# Patient Record
Sex: Male | Born: 1940 | Race: Black or African American | Hispanic: No | Smoking: Never smoker
Health system: Southern US, Community
[De-identification: ages and names within clinical notes are randomized; demographics above are authoritative.]

## PROBLEM LIST (undated history)

## (undated) DIAGNOSIS — I480 Paroxysmal atrial fibrillation: Secondary | ICD-10-CM

## (undated) DIAGNOSIS — E119 Type 2 diabetes mellitus without complications: Secondary | ICD-10-CM

## (undated) DIAGNOSIS — K922 Gastrointestinal hemorrhage, unspecified: Secondary | ICD-10-CM

## (undated) DIAGNOSIS — N183 Chronic kidney disease, stage 3 unspecified: Secondary | ICD-10-CM

## (undated) DIAGNOSIS — I442 Atrioventricular block, complete: Secondary | ICD-10-CM

## (undated) DIAGNOSIS — I119 Hypertensive heart disease without heart failure: Secondary | ICD-10-CM

## (undated) DIAGNOSIS — C189 Malignant neoplasm of colon, unspecified: Secondary | ICD-10-CM

## (undated) DIAGNOSIS — Z8679 Personal history of other diseases of the circulatory system: Secondary | ICD-10-CM

## (undated) DIAGNOSIS — I10 Essential (primary) hypertension: Secondary | ICD-10-CM

## (undated) HISTORY — DX: Hypertensive heart disease without heart failure: I11.9

## (undated) HISTORY — DX: Malignant neoplasm of colon, unspecified: C18.9

## (undated) HISTORY — DX: Gastrointestinal hemorrhage, unspecified: K92.2

## (undated) HISTORY — PX: CARDIAC CATHETERIZATION: SHX172

---

## 2005-03-03 ENCOUNTER — Ambulatory Visit (HOSPITAL_COMMUNITY): Payer: Self-pay | Admitting: General Surgery

## 2011-10-08 ENCOUNTER — Emergency Department (HOSPITAL_COMMUNITY): Payer: Medicare Other

## 2011-10-08 ENCOUNTER — Encounter (HOSPITAL_COMMUNITY): Payer: Self-pay | Admitting: Emergency Medicine

## 2011-10-08 ENCOUNTER — Inpatient Hospital Stay (HOSPITAL_COMMUNITY)
Admission: EM | Admit: 2011-10-08 | Discharge: 2011-10-11 | DRG: 308 | Disposition: A | Discharge status: Home / Self Care | Payer: Medicare Other | Attending: Internal Medicine | Admitting: Internal Medicine

## 2011-10-08 DIAGNOSIS — I509 Heart failure, unspecified: Secondary | ICD-10-CM

## 2011-10-08 DIAGNOSIS — I4891 Unspecified atrial fibrillation: Principal | ICD-10-CM | POA: Diagnosis present

## 2011-10-08 DIAGNOSIS — I5021 Acute systolic (congestive) heart failure: Secondary | ICD-10-CM | POA: Diagnosis present

## 2011-10-08 DIAGNOSIS — I428 Other cardiomyopathies: Secondary | ICD-10-CM | POA: Diagnosis present

## 2011-10-08 DIAGNOSIS — R0602 Shortness of breath: Secondary | ICD-10-CM | POA: Diagnosis present

## 2011-10-08 DIAGNOSIS — I5023 Acute on chronic systolic (congestive) heart failure: Secondary | ICD-10-CM | POA: Diagnosis present

## 2011-10-08 DIAGNOSIS — I429 Cardiomyopathy, unspecified: Secondary | ICD-10-CM | POA: Diagnosis present

## 2011-10-08 DIAGNOSIS — G4733 Obstructive sleep apnea (adult) (pediatric): Secondary | ICD-10-CM | POA: Diagnosis present

## 2011-10-08 DIAGNOSIS — E119 Type 2 diabetes mellitus without complications: Secondary | ICD-10-CM | POA: Diagnosis present

## 2011-10-08 DIAGNOSIS — I10 Essential (primary) hypertension: Secondary | ICD-10-CM | POA: Diagnosis present

## 2011-10-08 HISTORY — DX: Essential (primary) hypertension: I10

## 2011-10-08 LAB — MAGNESIUM: Magnesium: 1.7 mg/dL (ref 1.5–2.5)

## 2011-10-08 LAB — COMPREHENSIVE METABOLIC PANEL
AST: 20 U/L (ref 0–37)
Alkaline Phosphatase: 69 U/L (ref 39–117)
BUN: 13 mg/dL (ref 6–23)
CO2: 20 mEq/L (ref 19–32)
Chloride: 106 mEq/L (ref 96–112)
Creatinine, Ser: 1.08 mg/dL (ref 0.50–1.35)
GFR calc non Af Amer: 68 mL/min — ABNORMAL LOW (ref >90)
Potassium: 3.7 mEq/L (ref 3.5–5.1)
Total Bilirubin: 0.8 mg/dL (ref 0.3–1.2)

## 2011-10-08 LAB — CARDIAC PANEL(CRET KIN+CKTOT+MB+TROPI)
CK, MB: 7.8 ng/mL (ref 0.3–4.0)
Relative Index: 2.9 — ABNORMAL HIGH (ref 0.0–2.5)
Total CK: 296 U/L — ABNORMAL HIGH (ref 7–232)
Total CK: 269 U/L — ABNORMAL HIGH (ref 7–232)
Troponin I: <0.3 ng/mL

## 2011-10-08 LAB — DIFFERENTIAL
Basophils Absolute: 0 10*3/uL (ref 0.0–0.1)
Eosinophils Absolute: 0.1 10*3/uL (ref 0.0–0.7)
Eosinophils Relative: 2 % (ref 0–5)

## 2011-10-08 LAB — CBC
MCH: 27.1 pg (ref 26.0–34.0)
MCV: 79 fL (ref 78.0–100.0)
Platelets: 214 10*3/uL (ref 150–400)
RDW: 15.3 % (ref 11.5–15.5)

## 2011-10-08 LAB — HEPARIN LEVEL (UNFRACTIONATED): Heparin Unfractionated: 0.36 IU/mL (ref 0.30–0.70)

## 2011-10-08 LAB — POCT I-STAT, CHEM 8
BUN: 15 mg/dL (ref 6–23)
Creatinine, Ser: 1.1 mg/dL (ref 0.50–1.35)
Glucose, Bld: 212 mg/dL — ABNORMAL HIGH (ref 70–99)
Hemoglobin: 12.9 g/dL — ABNORMAL LOW (ref 13.0–17.0)
Potassium: 4 mEq/L (ref 3.5–5.1)

## 2011-10-08 LAB — POCT I-STAT TROPONIN I

## 2011-10-08 LAB — GLUCOSE, CAPILLARY: Glucose-Capillary: 162 mg/dL — ABNORMAL HIGH (ref 70–99)

## 2011-10-08 LAB — APTT: aPTT: 31 seconds (ref 24–37)

## 2011-10-08 LAB — TSH: TSH: 2.082 u[IU]/mL (ref 0.350–4.500)

## 2011-10-08 MED ORDER — INSULIN ASPART 100 UNIT/ML ~~LOC~~ SOLN
0.0000 [IU] | Freq: Every day | SUBCUTANEOUS | Status: DC
Start: 2011-10-08 — End: 2011-10-11

## 2011-10-08 MED ORDER — ASPIRIN EC 81 MG PO TBEC
81.0000 mg | DELAYED_RELEASE_TABLET | Freq: Every day | ORAL | Status: DC
  Administered 2011-10-09 – 2011-10-11 (×3): 81 mg via ORAL
  Filled 2011-10-08 (×3): qty 1

## 2011-10-08 MED ORDER — ADENOSINE 6 MG/2ML IV SOLN
INTRAVENOUS | Status: AC
  Filled 2011-10-08: qty 6

## 2011-10-08 MED ORDER — DILTIAZEM HCL 50 MG/10ML IV SOLN
10.0000 mg | Freq: Once | INTRAVENOUS | Status: AC
  Administered 2011-10-08: 10 mg via INTRAVENOUS
  Filled 2011-10-08: qty 2

## 2011-10-08 MED ORDER — LINAGLIPTIN 5 MG PO TABS
5.0000 mg | ORAL_TABLET | Freq: Every day | ORAL | Status: DC
  Administered 2011-10-08 – 2011-10-11 (×4): 5 mg via ORAL
  Filled 2011-10-08 (×4): qty 1

## 2011-10-08 MED ORDER — METOPROLOL TARTRATE 12.5 MG HALF TABLET: 12.5000 mg | ORAL_TABLET | Freq: Two times a day (BID) | ORAL | Status: DC

## 2011-10-08 MED ORDER — BENAZEPRIL HCL 40 MG PO TABS
40.0000 mg | ORAL_TABLET | Freq: Every day | ORAL | Status: DC
  Administered 2011-10-08 – 2011-10-11 (×4): 40 mg via ORAL
  Filled 2011-10-08 (×4): qty 1

## 2011-10-08 MED ORDER — SODIUM CHLORIDE 0.9 % IV SOLN
INTRAVENOUS | Status: DC
  Administered 2011-10-08: 10 mL/h via INTRAVENOUS

## 2011-10-08 MED ORDER — FUROSEMIDE 10 MG/ML IJ SOLN
40.0000 mg | Freq: Once | INTRAMUSCULAR | Status: AC
  Administered 2011-10-08: 40 mg via INTRAVENOUS
  Filled 2011-10-08: qty 4

## 2011-10-08 MED ORDER — ASPIRIN 81 MG PO CHEW
324.0000 mg | CHEWABLE_TABLET | ORAL | Status: AC
  Administered 2011-10-08: 324 mg via ORAL
  Filled 2011-10-08: qty 4

## 2011-10-08 MED ORDER — INSULIN ASPART 100 UNIT/ML ~~LOC~~ SOLN
0.0000 [IU] | Freq: Three times a day (TID) | SUBCUTANEOUS | Status: DC
  Administered 2011-10-08: 18:00:00 via SUBCUTANEOUS
  Administered 2011-10-09: 1 [IU] via SUBCUTANEOUS
  Administered 2011-10-09: 3 [IU] via SUBCUTANEOUS
  Administered 2011-10-09: 2 [IU] via SUBCUTANEOUS
  Administered 2011-10-10: 3 [IU] via SUBCUTANEOUS
  Administered 2011-10-10 – 2011-10-11 (×2): 2 [IU] via SUBCUTANEOUS

## 2011-10-08 MED ORDER — ZOLPIDEM TARTRATE 5 MG PO TABS: 5.0000 mg | ORAL_TABLET | Freq: Every evening | ORAL | Status: DC | PRN

## 2011-10-08 MED ORDER — HEPARIN (PORCINE) IN NACL 100-0.45 UNIT/ML-% IJ SOLN
1650.0000 [IU]/h | INTRAMUSCULAR | Status: AC
  Administered 2011-10-08: 1650 [IU]/h via INTRAVENOUS
  Administered 2011-10-08: 1550 [IU]/h via INTRAVENOUS
  Administered 2011-10-08 – 2011-10-11 (×4): 1650 [IU]/h via INTRAVENOUS
  Filled 2011-10-08 (×8): qty 250

## 2011-10-08 MED ORDER — DILTIAZEM HCL 25 MG/5ML IV SOLN
INTRAVENOUS | Status: AC
  Administered 2011-10-08: 10 mg
  Filled 2011-10-08: qty 5

## 2011-10-08 MED ORDER — ATORVASTATIN CALCIUM 40 MG PO TABS
40.0000 mg | ORAL_TABLET | Freq: Every day | ORAL | Status: DC
  Administered 2011-10-08 – 2011-10-10 (×3): 40 mg via ORAL
  Filled 2011-10-08 (×4): qty 1

## 2011-10-08 MED ORDER — ACETAMINOPHEN 325 MG PO TABS: 650.0000 mg | ORAL_TABLET | ORAL | Status: DC | PRN

## 2011-10-08 MED ORDER — FUROSEMIDE 40 MG PO TABS: 40.0000 mg | ORAL_TABLET | Freq: Every day | ORAL | Status: DC

## 2011-10-08 MED ORDER — ALPRAZOLAM 0.25 MG PO TABS: 0.2500 mg | ORAL_TABLET | Freq: Two times a day (BID) | ORAL | Status: DC | PRN

## 2011-10-08 MED ORDER — DILTIAZEM LOAD VIA INFUSION
10.0000 mg | Freq: Once | INTRAVENOUS | Status: AC
  Administered 2011-10-08: 10 mg via INTRAVENOUS

## 2011-10-08 MED ORDER — DILTIAZEM HCL 100 MG IV SOLR: 5.0000 mg/h | INTRAVENOUS | Status: DC

## 2011-10-08 MED ORDER — DILTIAZEM HCL 100 MG IV SOLR
5.0000 mg/h | INTRAVENOUS | Status: DC
  Administered 2011-10-08: 5 mg/h via INTRAVENOUS
  Administered 2011-10-08 – 2011-10-10 (×6): 10 mg/h via INTRAVENOUS
  Filled 2011-10-08 (×6): qty 100

## 2011-10-08 MED ORDER — FUROSEMIDE 10 MG/ML IJ SOLN
40.0000 mg | Freq: Two times a day (BID) | INTRAMUSCULAR | Status: DC
  Administered 2011-10-09 – 2011-10-10 (×4): 40 mg via INTRAVENOUS
  Filled 2011-10-08 (×5): qty 4

## 2011-10-08 MED ORDER — NITROGLYCERIN 0.4 MG SL SUBL: 0.4000 mg | SUBLINGUAL_TABLET | SUBLINGUAL | Status: DC | PRN

## 2011-10-08 MED ORDER — DOXAZOSIN MESYLATE 8 MG PO TABS
8.0000 mg | ORAL_TABLET | Freq: Every day | ORAL | Status: DC
  Administered 2011-10-08 – 2011-10-10 (×3): 8 mg via ORAL
  Filled 2011-10-08 (×5): qty 1

## 2011-10-08 MED ORDER — HEPARIN BOLUS VIA INFUSION
5000.0000 [IU] | Freq: Once | INTRAVENOUS | Status: AC
  Administered 2011-10-08: 5000 [IU] via INTRAVENOUS

## 2011-10-08 MED ORDER — ASPIRIN 300 MG RE SUPP: 300.0000 mg | RECTAL | Status: AC

## 2011-10-08 MED ORDER — ONDANSETRON HCL 4 MG/2ML IJ SOLN: 4.0000 mg | Freq: Four times a day (QID) | INTRAMUSCULAR | Status: DC | PRN

## 2011-10-08 NOTE — ED Notes (Signed)
PT. WOKE UP AT 4 AM THIS MORNING WITH PROGRESSING SOB AND CHEST CONGESTION ,  DENIES COUGH OR CHEST PAIN , NO FEVER OR CHILLS.

## 2011-10-08 NOTE — ED Notes (Signed)
Attempted to call report x 2; floor unable to accept to call back

## 2011-10-08 NOTE — ED Notes (Signed)
Attempted to call report to floor; floor unable to accept to call back 

## 2011-10-08 NOTE — H&P (Addendum)
Pt. Seen and examined. Agree with the NP/PA-C note as written.  He is feeling better .Marland Kitchen HR is now in the 70's with a-fib on cardizem.  Some mild heart failure, likely due to diastolic dysfunction and tachycardia, however, will need 2D echo to r/o tachycardia induced cardiomyopathy. Improved with lasix. Will admit to observation for diuresis and rate control. Start xarelto 20 mg daily today - CHADS2 score is 3 (HTN, DM2, CHF). Unknown onset a-fib, will likely need TEE/Cardioversion if he does not convert - given the size of his left atrium, may need anti-arrythmic therapy as well.  Chrystie Nose, MD, [facility] Attending Cardiologist [facility]

## 2011-10-08 NOTE — ED Notes (Signed)
Pt states he awoke around 4 am feeling sob and feeling as if his heart was racing.  Pt waited, hoping it would slow down.  Finally at 0615 he called his son to bring him to hospital.    Pt hr in 160's, feeling sob.  AO x 4.  Son at bedside.

## 2011-10-08 NOTE — Plan of Care (Signed)
Problem: Food- and Nutrition-Related Knowledge Deficit (NB-1.1) Goal: Nutrition education Formal process to instruct or train a patient/client in a skill or to impart knowledge to help patients/clients voluntarily manage or modify food choices and eating behavior to maintain or improve health.  Outcome: Completed/Met Date Met:  10/08/11 RD consulted for diet education for CHF and DM. Pt states that he has never had any DM education in the past but would like to know what to eat. RD went over carb containing foods with pt and number of carb servings per meal. Pt was following a low carb diet at home, trying to eat small portions of known carb foods. Pt was given the nutrition therapy for DM type 2 hand out. RD also went over a low sodium diet with patient. He was eating high salt foods like bacon or spam at meals. RD provided pt with HF booklet and went over modifications to diet to reduce sodium. Pt and family asked appropriate questions. RD encouraged pt/family to notify staff of additional questions. Pt also reported not yet having lunch, it had been ordered but not received. RD called [facility] to have meal sent.  RD reviewed chart, no additional nutrition interventions at this time.  Body mass index is 38.64 kg/(m^2). Obesity.  CBG (last 3)  No results found for this basename: GLUCAP:3 in the last 72 hours No results found for this basename: HGBA1C   KOWALSKI, Sharika Mosquera MARIE

## 2011-10-08 NOTE — Progress Notes (Signed)
  Echocardiogram 2D Echocardiogram has been performed.  Cathie Beams Deneen 10/08/2011, 12:15 PM

## 2011-10-08 NOTE — Progress Notes (Signed)
Utilization review complete 

## 2011-10-08 NOTE — H&P (Signed)
Ernest Lara is an 71 y.o. male.    Cardiologist: Dr. Royann Shivers PCP: Dr. Margaretmary Bayley  Chief Complaint: SOB and heart racing. 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 He felt congested and try to clear his throat this did not work he sat on the side of the bed he felt somewhat better from 4-5 but continued with shortness of breath. He denied Chest pain but was very diaphoretic initially which cleared. He had no lightheadedness no dizziness no syncope. On arrival to the emergency room he was found to be in atrial fib with rapid ventricular response he has a bundle branch block so initially it did look like possible ventricular tachycardia. He was given IV Cardizem with slowing of the heart rate to atrial fib with rates 110 to 130. Currently he remains in atrial fibrillation with a bundle-branch block rates 110 to 130 with when he stands to avoid his heart rate is increased to 140-150.  Additionally his BNP was 850 and his chest x-ray revealed mild congestive heart failure.  He has been given  40 mg of IV Lasix as well.  The patient sees Dr. Royann Shivers in our office.  He has hypertension, diabetes, chronic left bundle branch block and no known coronary artery disease. He has moderate left ventricular hypertrophy back to the care of moderate to severely dilated left atrium by this echo in 2006 but no meaningful valvular abnormalities were noted. EF was greater than 55%.  Other history does include diabetes mellitus and he is treated for it as well as hypertension and dyslipidemia.  Patient did have a Persantine Myoview 11/30/2004 that revealed mild diaphragmatic attenuation but no significant ischemia EF was 63%. Patient did have PVCs during the procedure.   Past Medical History  Diagnosis Date  . Hypertension   . Diabetes mellitus   . DM (diabetes mellitus),  type 2  10/08/2011  . HTN (hypertension) 10/08/2011    History reviewed. No pertinent past surgical history.  History reviewed. No pertinent family history. The patient's mother died at 54 she may have had coronary issues but he's not sure what the cause of death was father died in 60 but was not cardiac issue. The patient has no siblings.  Social History:  reports that he has never smoked. He has never used smokeless tobacco. He reports that he drinks about .6 ounces of alcohol per week. He reports that he does not use illicit drugs.  He is widowed and has 4 children and 2 grandchildren. 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  He exercises by walking at least once a week and most often twice a week depending on the weather.  Allergies: No Known Allergies  Outpatient medications: Norvasc 10 mg by mouth daily. Benzapril 40 mg daily Cardura 8 mg daily at bedtime next number Glucovance 5/500 one tablet by mouth twice a day with meals. Crestor 20 mg one daily. Januvia 100 mg by mouth daily. He no longer takes an aspirin daily.  (Not in a hospital admission)  Results for orders placed during the hospital encounter of 10/08/11 (from the past 48 hour(s))  CBC     Status: Abnormal   Collection Time   10/08/11  6:58 AM      Component Value Range Comment   WBC 8.0  4.0 - 10.5 (K/uL)    RBC 4.61  4.22 -  5.81 (MIL/uL)    Hemoglobin 12.5 (*) 13.0 - 17.0 (g/dL)    HCT 16.1 (*) 09.6 - 52.0 (%)    MCV 79.0  78.0 - 100.0 (fL)    MCH 27.1  26.0 - 34.0 (pg)    MCHC 34.3  30.0 - 36.0 (g/dL)    RDW 04.5  40.9 - 81.1 (%)    Platelets 214  150 - 400 (K/uL)   DIFFERENTIAL     Status: Normal   Collection Time   10/08/11  6:58 AM      Component Value Range Comment   Neutrophils Relative 72  43 - 77 (%)    Neutro Abs 5.8  1.7 - 7.7 (K/uL)    Lymphocytes Relative 21  12 - 46 (%)    Lymphs Abs 1.7  0.7 - 4.0 (K/uL)    Monocytes Relative 5  3 - 12 (%)    Monocytes Absolute 0.4  0.1 -  1.0 (K/uL)    Eosinophils Relative 2  0 - 5 (%)    Eosinophils Absolute 0.1  0.0 - 0.7 (K/uL)    Basophils Relative 1  0 - 1 (%)    Basophils Absolute 0.0  0.0 - 0.1 (K/uL)   MAGNESIUM     Status: Normal   Collection Time   10/08/11  6:58 AM      Component Value Range Comment   Magnesium 1.7  1.5 - 2.5 (mg/dL)   COMPREHENSIVE METABOLIC PANEL     Status: Abnormal   Collection Time   10/08/11  6:58 AM      Component Value Range Comment   Sodium 139  135 - 145 (mEq/L)    Potassium 3.7  3.5 - 5.1 (mEq/L)    Chloride 106  96 - 112 (mEq/L)    CO2 20  19 - 32 (mEq/L)    Glucose, Bld 204 (*) 70 - 99 (mg/dL)    BUN 13  6 - 23 (mg/dL)    Creatinine, Ser 9.14  0.50 - 1.35 (mg/dL)    Calcium 9.9  8.4 - 10.5 (mg/dL)    Total Protein 7.1  6.0 - 8.3 (g/dL)    Albumin 3.7  3.5 - 5.2 (g/dL)    AST 20  0 - 37 (U/L)    ALT 26  0 - 53 (U/L)    Alkaline Phosphatase 69  39 - 117 (U/L)    Total Bilirubin 0.8  0.3 - 1.2 (mg/dL)    GFR calc non Af Amer 68 (*) >90 (mL/min)    GFR calc Af Amer 78 (*) >90 (mL/min)   PRO B NATRIURETIC PEPTIDE     Status: Abnormal   Collection Time   10/08/11  6:58 AM      Component Value Range Comment   Pro B Natriuretic peptide (BNP) 895.0 (*) 0 - 125 (pg/mL)   APTT     Status: Normal   Collection Time   10/08/11  6:58 AM      Component Value Range Comment   aPTT 31  24 - 37 (seconds)   PROTIME-INR     Status: Normal   Collection Time   10/08/11  6:58 AM      Component Value Range Comment   Prothrombin Time 14.0  11.6 - 15.2 (seconds)    INR 1.06  0.00 - 1.49    CARDIAC PANEL(CRET KIN+CKTOT+MB+TROPI)     Status: Abnormal   Collection Time   10/08/11  6:59 AM      Component  Value Range Comment   Total CK 296 (*) 7 - 232 (U/L)    CK, MB 8.5 (*) 0.3 - 4.0 (ng/mL)    Troponin I <0.30  <0.30 (ng/mL)    Relative Index 2.9 (*) 0.0 - 2.5    POCT I-STAT TROPONIN I     Status: Normal   Collection Time   10/08/11  8:05 AM      Component Value Range Comment   Troponin i,  poc 0.02  0.00 - 0.08 (ng/mL)    Comment 3            POCT I-STAT, CHEM 8     Status: Abnormal   Collection Time   10/08/11  8:06 AM      Component Value Range Comment   Sodium 142  135 - 145 (mEq/L)    Potassium 4.0  3.5 - 5.1 (mEq/L)    Chloride 109  96 - 112 (mEq/L)    BUN 15  6 - 23 (mg/dL)    Creatinine, Ser 1.61  0.50 - 1.35 (mg/dL)    Glucose, Bld 096 (*) 70 - 99 (mg/dL)    Calcium, Ion 0.45 (*) 1.12 - 1.32 (mmol/L)    TCO2 24  0 - 100 (mmol/L)    Hemoglobin 12.9 (*) 13.0 - 17.0 (g/dL)    HCT 40.9 (*) 81.1 - 52.0 (%)    Dg Chest Port 1 View  10/08/2011  *RADIOLOGY REPORT*  Clinical Data: Tachycardia, congestion  PORTABLE CHEST - 1 VIEW  Comparison: None.  Findings: Cardiomegaly with patchy bilateral perihilar and lower lobe opacities, possibly reflecting mild interstitial edema, underlying infection not excluded.  No pleural effusion or pneumothorax.  IMPRESSION: Multifocal patchy opacities, possibly reflecting mild interstitial edema, underlying infection not excluded.  Cardiomegaly.  Original Report Authenticated By: Charline Bills, M.D.    ROS: General:No recent colds or fevers. Skin:No rashes or ulcers. HEENT:No blurred vision or double fashion. CV:No chest pain, no awareness of rapid heart rate. BJY:NWGNFAOZH of breath that began at 4 AM this morning none prior to that time GI:No diarrhea constipation or melena GU:No dysuria or hematuria YQ:MVHQIO joint pain or back pain Neuro:No lightheadedness, dizziness or syncope. Endo:Diabetes is well-controlled unknown thyroid status.   Blood pressure 134/105, pulse 97, temperature 98.4 F (36.9 C), temperature source Oral, resp. rate 26, SpO2 95.00%. PE: General:Alert and oriented [place] American male currently in no acute distress. Skin:Warm and dry brisk capillary refill. HEENT:Normocephalic, sclera clear. Neck:Supple no JVD no carotid bruits.2+ carotid upstroke Heart:S1-S2 irregularly irregular and rapid no obvious murmur  noted. Lungs:Findings crackles in the bases upper lobes are clear NGE:XBMWUXLK bowel sounds soft nontender do not palpate liver spleen or masses GMW:NUUVO edema in the left ankle right without edema 1+ pedal pulses bilaterally, 2+ radial pulses bilaterally Neuro:Alert and oriented x3 follows commands, moves all extremities.    Assessment/Plan Patient Active Problem List  Diagnoses  . Shortness of breath, acute, awakened from sleep, 10/08/11  . Atrial fibrillation with RVR, new onset, 10/08/11  . DM (diabetes mellitus), type 2   . HTN (hypertension)  . CHF (congestive heart failure), mild CHF secondary to tachycardia   PLAN: Will admit to step down unit and control heart rate with Cardizem and add by mouth beta blocker.  We'll begin IV heparin. We will do serial cardiac enzymes and evaluate TSH. We'll also do 2-D echo.  Depending on cardiac enzymes he may need a cardiac cath versus stress Myoview.  The cardiologist will see for further recommendations  and evaluation.  Neave Lenger R 10/08/2011, 9:41 AM    p

## 2011-10-08 NOTE — ED Notes (Signed)
Pt responding to diltiazem.  HR decreased to 111.

## 2011-10-08 NOTE — ED Provider Notes (Signed)
History     CSN: [id number]  Arrival date & time 10/08/11  4098   First MD Initiated Contact with Patient 10/08/11 [phone]      Chief Complaint  Patient presents with  . Shortness of Breath    (Consider location/radiation/quality/duration/timing/severity/associated sxs/prior treatment) HPI Comments: Patient states he awoke this morning with severe shortness of breath. He denied any palpitations, chest pain, diaphoresis or nausea. He states he felt congestion in his chest and was coughing and trying to breathe better but was unable to cough anything up. He states yesterday he felt his normal self. Patient has no history of asthma is not a smoker and only medical history is hypertension and diabetes. He recalls that he took all of his medications yesterday as prescribed  Patient is a 71 y.o. male presenting with shortness of breath. The history is provided by the patient.  Shortness of Breath  The current episode started today. The onset was sudden. The problem occurs continuously. The problem has been unchanged. The problem is severe. The symptoms are relieved by nothing. The symptoms are aggravated by nothing. Associated symptoms include cough, shortness of breath and wheezing. Pertinent negatives include no chest pain, no chest pressure, no fever and no rhinorrhea. The cough is non-productive, dry and hacking. His past medical history does not include asthma or past wheezing. There were no sick contacts. He has received no recent medical care.    Past Medical History  Diagnosis Date  . Hypertension   . Diabetes mellitus     History reviewed. No pertinent past surgical history.  No family history on file.  History  Substance Use Topics  . Smoking status: Never Smoker   . Smokeless tobacco: Not on file  . Alcohol Use: Yes      Review of Systems  Constitutional: Negative for fever and diaphoresis.  HENT: Negative for rhinorrhea.   Respiratory: Positive for cough, shortness of  breath and wheezing.   Cardiovascular: Negative for chest pain, palpitations and leg swelling.  Gastrointestinal: Negative for nausea and vomiting.  Neurological: Negative for dizziness, syncope and weakness.  All other systems reviewed and are negative.    Allergies  Review of patient's allergies indicates no known allergies.  Home Medications   Current Outpatient Rx  Name Route Sig Dispense Refill  . AMLODIPINE BESYLATE 10 MG PO TABS Oral Take 10 mg by mouth daily.    Marland Kitchen BENAZEPRIL HCL 40 MG PO TABS Oral Take 40 mg by mouth daily.    Marland Kitchen DOXAZOSIN MESYLATE 8 MG PO TABS Oral Take 8 mg by mouth at bedtime.    . GLYBURIDE-METFORMIN 5-500 MG PO TABS Oral Take 1 tablet by mouth 2 (two) times daily with a meal.    . ROSUVASTATIN CALCIUM 20 MG PO TABS Oral Take 20 mg by mouth at bedtime.    Marland Kitchen SITAGLIPTIN PHOSPHATE 100 MG PO TABS Oral Take 100 mg by mouth daily.      BP 130/88  Temp(Src) 98.4 F (36.9 C) (Oral)  Resp 24  SpO2 91%  Physical Exam  Nursing note and vitals reviewed. Constitutional: He is oriented to person, place, and time. He appears well-developed and well-nourished. No distress.  HENT:  Head: Normocephalic and atraumatic.  Mouth/Throat: Oropharynx is clear and moist.  Eyes: Conjunctivae and EOM are normal. Pupils are equal, round, and reactive to light.  Neck: Normal range of motion. Neck supple.  Cardiovascular: Intact distal pulses.  An irregular rhythm present. Tachycardia present.   No murmur heard.  Pulmonary/Chest: Effort normal. No respiratory distress. He has no wheezes. He has rales in the right lower field and the left lower field. He exhibits no tenderness.  Abdominal: Soft. He exhibits no distension. There is no tenderness. There is no rebound and no guarding.  Musculoskeletal: Normal range of motion. He exhibits edema. He exhibits no tenderness.       Trace edema bilaterally in the lower extremities  Neurological: He is alert and oriented to person,  place, and time.  Skin: Skin is warm and dry. No rash noted. No erythema.  Psychiatric: He has a normal mood and affect. His behavior is normal.    ED Course  Procedures (including critical care time)  Labs Reviewed  CBC - Abnormal; Notable for the following:    Hemoglobin 12.5 (*)    HCT 36.4 (*)    All other components within normal limits  CARDIAC PANEL(CRET KIN+CKTOT+MB+TROPI) - Abnormal; Notable for the following:    Total CK 296 (*)    CK, MB 8.5 (*)    Relative Index 2.9 (*)    All other components within normal limits  DIFFERENTIAL  MAGNESIUM  COMPREHENSIVE METABOLIC PANEL  PRO B NATRIURETIC PEPTIDE  APTT  PROTIME-INR   No results found.   Date: 10/08/2011  Rate: 162  Rhythm: wide complex tachycardia with slight irregularity and ectopic beats  QRS Axis: normal  Intervals: normal  ST/T Wave abnormalities: nonspecific ST/T changes  Conduction Disutrbances:nonspecific intraventricular conduction delay  Narrative Interpretation:   Old EKG Reviewed: none available   Date: 10/08/2011  Rate: 124  Rhythm: atrial fibrillation  QRS Axis: normal  Intervals: atrial fibrillation  ST/T Wave abnormalities: nonspecific ST/T changes  Conduction Disutrbances:nonspecific intraventricular conduction delay  Narrative Interpretation:   Old EKG Reviewed: changes noted   CRITICAL CARE Performed by: Gwyneth Sprout   Total critical care time: 45  Critical care time was exclusive of separately billable procedures and treating other patients.  Critical care was necessary to treat or prevent imminent or life-threatening deterioration.  Critical care was time spent personally by me on the following activities: development of treatment plan with patient and/or surrogate as well as nursing, discussions with consultants, evaluation of patient's response to treatment, examination of patient, obtaining history from patient or surrogate, ordering and performing treatments and  interventions, ordering and review of laboratory studies, ordering and review of radiographic studies, pulse oximetry and re-evaluation of patient's condition.    1. Atrial fibrillation with RVR   2. CHF (congestive heart failure)       MDM   Patient presented with shortness of breath that started abruptly at about 4 AM this morning. He also is complaining of chest congestion but denied chest pain, palpitations, nausea, diaphoresis. Patient has a prior history of hypertension and diabetes and felt normal yesterday. He took all of his medication normally yesterday.  On arrival today patient was in a wide complex tachycardia. It was slightly irregular and appeared to be atrial fibrillation with RVR with a left bundle branch block. Patient was in no acute distress and protecting his airway. He was given 10 mg bolus of diltiazem which improved his heart rate from 160s to 100s. He had frequent ectopy but overall stated he felt much better.  CBC, CMP, BNP,. Enzymes, coags, chest x-ray pending. Will repeat an EKG. Patient placed a diltiazem drip and was increased to 10 due to heart rate increasing to 120.  Will consult cardiology for admission.        [facility],  MD 10/08/11 1610

## 2011-10-08 NOTE — Progress Notes (Signed)
ANTICOAGULATION CONSULT NOTE - Initial Consult  Pharmacy Consult for Heparin Indication: New onset Afib with RVR  No Known Allergies  Patient Measurements:  Height: 73 inches Weight: 122.5 kg Heparin Dosing Weight: 107kg  Vital Signs: Temp: 98.4 F (36.9 C) (04/30 0651) Temp src: Oral (04/30 0651) BP: 134/105 mmHg (04/30 0720) Pulse Rate: 97  (04/30 0720)  Labs:  Alvira Philips 10/08/11 0806 10/08/11 0659 10/08/11 0658  HGB 12.9* -- 12.5*  HCT 38.0* -- 36.4*  PLT -- -- 214  APTT -- -- 31  LABPROT -- -- 14.0  INR -- -- 1.06  HEPARINUNFRC -- -- --  CREATININE 1.10 -- 1.08  CKTOTAL -- 296* --  CKMB -- 8.5* --  TROPONINI -- <0.30 --   CrCl is unknown because there is no height on file for the current visit.  Medical History: Past Medical History  Diagnosis Date  . Hypertension   . Diabetes mellitus     Medications:  See electronic med rec  Assessment: 70yom to start heparin for new onset Afib with RVR. Patient reports no bleeding and not taking any anticoagulants. - Baseline INR 1.06  - H/H and Plts wnl - Heparin dosing weight: 107kg  Goal of Therapy:  Heparin level 0.3-0.7 units/ml   Plan:  1. Heparin IV bolus 5000 units x 1 2. Heparin drip 1550 units/hr (15.5 ml/hr) 3. Check heparin level 8 hours after heparin initiation  4. Daily heparin level and CBC  Cleon Dew [phone] 10/08/2011,9:30 AM

## 2011-10-08 NOTE — Progress Notes (Signed)
ANTICOAGULATION CONSULT NOTE - Follow Up Consult  Pharmacy Consult for Heparin Indication:  New onset atrial fibrillation with RVR  No Known Allergies  Patient Measurements: Height: 6' 0.1" (183.1 cm) Weight: 285 lb 11.5 oz (129.6 kg) IBW/kg (Calculated) : 77.83  Heparin Dosing Weight: 107 kg  Vital Signs: Temp: 97.5 F (36.4 C) (04/30 1406) Temp src: Oral (04/30 1406) BP: 137/68 mmHg (04/30 1406) Pulse Rate: 112  (04/30 1406)  Labs:  Basename 10/08/11 1743 10/08/11 1742 10/08/11 1015 10/08/11 0806 10/08/11 0659 10/08/11 0658  HGB -- -- -- 12.9* -- 12.5*  HCT -- -- -- 38.0* -- 36.4*  PLT -- -- -- -- -- 214  APTT -- -- -- -- -- 31  LABPROT -- -- -- -- -- 14.0  INR -- -- -- -- -- 1.06  HEPARINUNFRC 0.36 -- -- -- -- --  CREATININE -- -- -- 1.10 -- 1.08  CKTOTAL -- 244* 269* -- 296* --  CKMB -- 6.8* 7.8* -- 8.5* --  TROPONINI -- <0.30 <0.30 -- <0.30 --   Estimated Creatinine Clearance: 87.1 ml/min (by C-G formula based on Cr of 1.1).  Assessment:  Heparin level is low therapeutic on 1550 units/hr.   Goal of Therapy:  Heparin level 0.3-0.7 units/ml   Plan:     Will increase heparin drip to 1650 units/hr, to try to keep level at goal.  Next heparin level and CBC in am.  Dennie Fetters, RPh Pager: [phone] 10/08/2011,7:12 PM

## 2011-10-09 LAB — BASIC METABOLIC PANEL
BUN: 11 mg/dL (ref 6–23)
GFR calc Af Amer: 69 mL/min — ABNORMAL LOW (ref >90)
GFR calc non Af Amer: 60 mL/min — ABNORMAL LOW (ref >90)
Potassium: 3.8 mEq/L (ref 3.5–5.1)
Sodium: 139 mEq/L (ref 135–145)

## 2011-10-09 LAB — CBC
HCT: 34.2 % — ABNORMAL LOW (ref 39.0–52.0)
MCHC: 34.2 g/dL (ref 30.0–36.0)
RDW: 15.1 % (ref 11.5–15.5)

## 2011-10-09 LAB — HEMOGLOBIN A1C: Mean Plasma Glucose: 169 mg/dL — ABNORMAL HIGH (ref <117)

## 2011-10-09 LAB — LIPID PANEL
Cholesterol: 87 mg/dL (ref 0–200)
VLDL: 16 mg/dL (ref 0–40)

## 2011-10-09 LAB — GLUCOSE, CAPILLARY: Glucose-Capillary: 142 mg/dL — ABNORMAL HIGH (ref 70–99)

## 2011-10-09 LAB — HEPARIN LEVEL (UNFRACTIONATED): Heparin Unfractionated: 0.5 IU/mL (ref 0.30–0.70)

## 2011-10-09 LAB — TSH: TSH: 1.883 u[IU]/mL (ref 0.350–4.500)

## 2011-10-09 MED ORDER — SODIUM CHLORIDE 0.9 % IJ SOLN: 3.0000 mL | Freq: Two times a day (BID) | INTRAMUSCULAR | Status: DC

## 2011-10-09 MED ORDER — DILTIAZEM LOAD VIA INFUSION
10.0000 mg | Freq: Once | INTRAVENOUS | Status: AC
  Administered 2011-10-09: 10 mg via INTRAVENOUS
  Filled 2011-10-09: qty 10

## 2011-10-09 MED ORDER — POTASSIUM CHLORIDE CRYS ER 20 MEQ PO TBCR
20.0000 meq | EXTENDED_RELEASE_TABLET | Freq: Every day | ORAL | Status: DC
  Administered 2011-10-09 – 2011-10-11 (×3): 20 meq via ORAL
  Filled 2011-10-09 (×3): qty 1

## 2011-10-09 MED ORDER — SODIUM CHLORIDE 0.9 % IV SOLN
INTRAVENOUS | Status: DC
  Administered 2011-10-09: 17:00:00 via INTRAVENOUS
  Administered 2011-10-11: 10 mL/h via INTRAVENOUS

## 2011-10-09 MED ORDER — AMIODARONE HCL 200 MG PO TABS
400.0000 mg | ORAL_TABLET | Freq: Three times a day (TID) | ORAL | Status: DC
  Administered 2011-10-09 – 2011-10-11 (×6): 400 mg via ORAL
  Filled 2011-10-09 (×9): qty 2

## 2011-10-09 MED ORDER — SODIUM CHLORIDE 0.9 % IJ SOLN: 3.0000 mL | INTRAMUSCULAR | Status: DC | PRN

## 2011-10-09 MED ORDER — SODIUM CHLORIDE 0.9 % IV SOLN: 250.0000 mL | INTRAVENOUS | Status: DC

## 2011-10-09 MED ORDER — HYDROCORTISONE 1 % EX CREA
1.0000 "application " | TOPICAL_CREAM | Freq: Three times a day (TID) | CUTANEOUS | Status: DC | PRN
  Filled 2011-10-09: qty 28

## 2011-10-09 NOTE — Progress Notes (Signed)
ANTICOAGULATION CONSULT NOTE - Follow Up Consult  Pharmacy Consult for Heparin Indication:  New onset atrial fibrillation with RVR  No Known Allergies  Patient Measurements: Height: 6' 0.1" (183.1 cm) Weight: 285 lb 4.4 oz (129.4 kg) (a scale) IBW/kg (Calculated) : 77.83  Heparin Dosing Weight: 107 kg  Vital Signs: Temp: 98.2 F (36.8 C) (05/01 0700) BP: 134/86 mmHg (05/01 0700) Pulse Rate: 118  (05/01 0700)  Labs:  Basename 10/09/11 0523 10/08/11 1743 10/08/11 1742 10/08/11 1015 10/08/11 0806 10/08/11 0659 10/08/11 0658  HGB 11.7* -- -- -- 12.9* -- --  HCT 34.2* -- -- -- 38.0* -- 36.4*  PLT 211 -- -- -- -- -- 214  APTT -- -- -- -- -- -- 31  LABPROT -- -- -- -- -- -- 14.0  INR -- -- -- -- -- -- 1.06  HEPARINUNFRC 0.50 0.36 -- -- -- -- --  CREATININE 1.20 -- -- -- 1.10 -- 1.08  CKTOTAL -- -- 244* 269* -- 296* --  CKMB -- -- 6.8* 7.8* -- 8.5* --  TROPONINI -- -- <0.30 <0.30 -- <0.30 --   Estimated Creatinine Clearance: 79.7 ml/min (by C-G formula based on Cr of 1.2).  Assessment: 70 yom on heparin for new onset afib and level is at goal. Patient noted for TEE with DCCV on 10/10/11 with plans for Xarelto.   Goal of Therapy:  Heparin level 0.3-0.7 units/ml   Plan:  -Continue heparin at 1650 units/hr -Will follow progress  Harland German, Pharm D 10/09/2011 8:44 AM

## 2011-10-09 NOTE — Progress Notes (Signed)
Patients hr range 120's to 130's. Nada Boozer NP made aware with orders to give cardizem bolus 10 mg. And to increase drip to 15 if hr still goes above 110. cardizem bolus 10 mg as ordered HR went down to upper 90's. cardizem still maintained at 10 ml /hr. Pt asymptomatic. No c/o cp nor palpitations O2 sat 98 % on room air.

## 2011-10-09 NOTE — Progress Notes (Signed)
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 He felt congested and try to clear his throat this did not work he sat on Ernest side of Ernest bed he felt somewhat better from 4-5 but continued with shortness of breath. He denied Chest pain but was very diaphoretic initially which cleared. He had no lightheadedness no dizziness no syncope. On arrival to Ernest emergency room he was found to be Ernest atrial fib with rapid ventricular response he has a bundle branch block so initially it did look like possible ventricular tachycardia. He was given IV Cardizem with slowing of Ernest heart rate to atrial fib with rates 110 to 130. Currently he remains Ernest atrial fibrillation with a bundle-branch block rates 110 to 130 with when he stands to void his heart rate is increased to 140-150. Additionally his BNP was 850 and his chest x-ray revealed mild congestive heart failure. He had been given  Lasix.  Echo done here at [facility] 10/08/11 found EF had dropped to 20-25%. ? Tachycardia induced.    During Ernest night heart rate up and resp. IV Lasix continued and another bolus of cardizem given.  With improved Heart rate.  But HR has now increased to 120.   Subjective: No chest pain. Stated his breathing is better.   Objective: Vital signs Ernest last 24 hours: Temp:  [97.5 F (36.4 C)-99.1 F (37.3 C)] 98.2 F (36.8 C) (05/01 0700) Pulse Rate:  [57-130] 118  (05/01 0700) Resp:  [15-24] 20  (05/01 0700) BP: (109-144)/(66-94) 134/86 mmHg (05/01 0700) SpO2:  [95 %-99 %] 98 % (05/01 0700) Weight:  [129.4 kg (285 lb 4.4 oz)-129.6 kg (285 lb 11.5 oz)] 129.4 kg (285 lb 4.4 oz) (05/01 0245) Weight change:  Last BM Date: 10/08/11 Intake/Output from previous day: -1216 04/30 0701 - 05/01 0700 Ernest: 1033.3 [P.O.:360; I.V.:665.3; IV Piggyback:8] Out: 2250 [Urine:2250] Intake/Output this shift:    PE:  General:alert, oriented, pleasant affect.    Neck:no JVD Heart:S1S2 irreg irreg Lungs:few rales Ernest Ernest bases, no wheezes ZOX:WRUEA, soft, non tender Ext:no edema, + pedal pulses.  Slight decrease Ernest wt. From 129.6 Kg to 129.4   Lab Results:  Basename 10/09/11 0523 10/08/11 0806 10/08/11 0658  WBC 7.2 -- 8.0  HGB 11.7* 12.9* --  HCT 34.2* 38.0* --  PLT 211 -- 214   BMET  Basename 10/09/11 0523 10/08/11 0806 10/08/11 0658  NA 139 142 --  K 3.8 4.0 --  CL 106 109 --  CO2 22 -- 20  GLUCOSE 167* 212* --  BUN 11 15 --  CREATININE 1.20 1.10 --  CALCIUM 9.8 -- 9.9    Basename 10/08/11 1742 10/08/11 1015  TROPONINI <0.30 <0.30    Lab Results  Component Value Date   CHOL 87 10/09/2011   HDL 56 10/09/2011   LDLCALC 15 10/09/2011   TRIG 82 10/09/2011   CHOLHDL 1.6 10/09/2011   Lab Results  Component Value Date   HGBA1C 7.5* 10/08/2011     Lab Results  Component Value Date   TSH 1.883 10/08/2011    Hepatic Function Panel  Basename 10/08/11 0658  PROT 7.1  ALBUMIN 3.7  AST 20  ALT 26  ALKPHOS 69  BILITOT 0.8  BILIDIR --  IBILI --    Basename 10/09/11 0523  CHOL 87   No results found for this basename: PROTIME Ernest Ernest last 72 hours  EKG: Orders placed during Ernest hospital encounter of 10/08/11  . EKG 12-LEAD  . EKG 12-LEAD  . ED EKG  . ED EKG  . EKG 12-LEAD  . EKG 12-LEAD  . EKG 12-LEAD  . EKG 12-LEAD  . EKG 12-LEAD  . EKG 12-LEAD  . EKG 12-LEAD    Studies/Results: Dg Chest Port 1 View  10/08/2011  *RADIOLOGY REPORT*  Clinical Data: Tachycardia, congestion  PORTABLE CHEST - 1 VIEW  Comparison: None.  Findings: Cardiomegaly with patchy bilateral perihilar and lower lobe opacities, possibly reflecting mild interstitial edema, underlying infection not excluded.  No pleural effusion or pneumothorax.  IMPRESSION: Multifocal patchy opacities, possibly reflecting mild interstitial edema, underlying infection not excluded.  Cardiomegaly.  Original Report  Authenticated By: Charline Bills, M.D.   2D Echo:   10/08/11 Left ventricle: Ernest cavity size was normal. There was moderate concentric hypertrophy. Systolic function was severely reduced. Ernest estimated ejection fraction was Ernest Ernest range of 20% to 25%. Ernest study is not technically sufficient to allow evaluation of LV diastolic function. Ernest E/e' ratio is >20, suggesting markedly elevated LV filling pressure. - Mitral valve: Calcified annulus. Trivial regurgitation. - Left atrium: Severely dilated (43 ml/m2). - Tricuspid valve: Trivial regurgitation. - Pulmonary arteries: PA peak pressure: 23mm Hg + right atrial pressure(S). - Systemic veins: Ernest IVC was not well visualized    Medications: I have reviewed Ernest patient's current medications.    Marland Kitchen aspirin  324 mg Oral NOW   Or  . aspirin  300 mg Rectal NOW  . aspirin EC  81 mg Oral Daily  . atorvastatin  40 mg Oral q1800  . benazepril  40 mg Oral Daily  . diltiazem  10 mg Intravenous Once  . diltiazem  10 mg Intravenous Once  . doxazosin  8 mg Oral QHS  . furosemide  40 mg Intravenous Once  . furosemide  40 mg Intravenous Q12H  . heparin  5,000 Units Intravenous Once  . insulin aspart  0-5 Units Subcutaneous QHS  . insulin aspart  0-9 Units Subcutaneous TID WC  . linagliptin  5 mg Oral Daily  . DISCONTD: furosemide  40 mg Oral Daily  . DISCONTD: metoprolol tartrate  12.5 mg Oral BID   Assessment/Plan: Patient Active Problem List  Diagnoses  . Shortness of breath, acute, awakened from sleep, 10/08/11  . Atrial fibrillation with RVR, new onset, 10/08/11  . DM (diabetes mellitus), type 2   . HTN (hypertension)  . CHF (congestive heart failure), mild CHF secondary to tachycardia  . Cardiomyopathy, poss. related to tachycardia , new, on echo 10/08/11 EF 20-25%   PLAN: New Afib with RVR, controlled at times with IV dilt.  With any activity HR back up. LA is severely dilated.  ? Need for antiarrhythmic.  Also plan for TEE and  DCCV tomorrow with Dr. Royann Shivers which Dr. Royann Shivers will schedule tomorrow unable to place on schedule currently secondary to anesthesia. Will plan TEE Ernest Endo and DCCV Ernest cath lab.  Discussed with Dr. Royann Shivers.  Will make NPO after midnight.  IV Cardizem is at 10 mg/hr.  IV Heparin.  We added coreg yest at 3.125 BID for CHF and cardiomyopathy which is new. Pt. Is on ACE.  Dietician discussed 2 gm na diet and Diabetic diet with pt.   ?add Lanoxin to help improve rate control.  Anticoagulation with heparin currently.  Plan for Xarelto 20 mg daily after cardioversion.    Negative cardiac enzymes.  No hx. Of CAD  and neg. nuc study Ernest 2006.  Plan outpt. nuc study.  TSH -WNL at 2.08,  hgb A1C 7.5, Glucovance on hold, he is on SSI. Pro BNP is down from 850 to  665.9  EKG with LBBB, afib with RVR.   Tele with Afib occ PVCs and occ rapid heart rate.  LOS: 1 day   Ernest,Ernest Ernest 10/09/2011, 8:14 AM   Patient seen and examined. Agree with assessment and plan.  Long discussion with pt and family.  With reduced LV fxn and severely dilated LA recommend amiodarone loading to improve success of cardioversion and to reduce recurrence of A Fib.  Also, I am highly suspicious of OSA with nocturnal oxygen desaturation contributing to A FIB. Pt does snore and examination of oropharynx reveals a Malinpatti score of 4. Pt will need a sleep study as outpt due to doubling of risk for recurrent A Fib if OSA is left untreated. Will need an ischemic evaluation with risk factors and cardiomyopathy.   Lennette Bihari, MD, [facility] 10/09/2011 10:04 AM

## 2011-10-09 NOTE — Clinical Documentation Improvement (Signed)
CHF DOCUMENTATION CLARIFICATION QUERY   THIS DOCUMENT IS NOT A PERMANENT PART OF THE MEDICAL RECORD   Please update your documentation within the medical record to reflect your response to this query.                                                                                     10/09/11  Dr. Tresa Endo and/or Associates,  In a better effort to capture your patient's severity of illness, reflect appropriate length of stay and utilization of resources, a review of the patient medical record has revealed the following indicators the diagnosis of Heart Failure:  10/08/11  Echo Left ventricle: The cavity size was normal. There was moderate concentric hypertrophy. Systolic function was severely reduced. The estimated ejection fraction was in the range of 20% to 25%. The study is not technically sufficient to allow evaluation of LV diastolic function.  The E/e' ratio is >20, suggesting markedly elevated LV filling pressure.   Mitral valve: Calcified annulus. Trivial regurgitation. Left atrium: Severely dilated (43 ml/m2). Tricuspid valve: Trivial regurgitation. Pulmonary arteries: PA peak pressure: 23mm Hg + right atrial pressure(S). Systemic veins: The IVC was not well visualized.  Admitted for A-fib with RVR and Heart Failure  Treated with IV Lasix, Initiation of Coreg   Based on your clinical judgment, Please document the ACUITY and TYPE of Heart Failure in the progress notes and discharge summary.  ACUITY:  - Acute  - Chronic   - Acute on Chronic  And  TYPE:  - Systolic   - Diastolic  - Combined   In responding to this query please exercise your independent judgment.  The fact that a query is asked, does not imply that any particular answer is desired or expected.  Reviewed: additional documentation in the medical record  Thank You,  Jerral Ralph  RN BSN Certified Clinical Documentation Specialist: Cell   [phone]  Health Information Management [facility]   TO RESPOND TO THE THIS QUERY, FOLLOW THE INSTRUCTIONS BELOW:  1. If needed, update documentation for the patient's encounter via the notes activity.  2. Access this query again and click edit on the In [employer].  3. After updating, or not, click F2 to complete all highlighted (required) fields concerning your review. Select "additional documentation in the medical record" OR "no additional documentation provided".  4. Click Sign note button.  5. The deficiency will fall out of your In Basket *Please let us know if you are not able to complete this workflow by phone or e-mail (listed below).  Thank you!--Beacher Every

## 2011-10-10 ENCOUNTER — Encounter (HOSPITAL_COMMUNITY)
Admission: EM | Disposition: A | Discharge status: Home / Self Care | Payer: Self-pay | Source: Home / Self Care | Attending: Internal Medicine

## 2011-10-10 ENCOUNTER — Inpatient Hospital Stay (HOSPITAL_COMMUNITY): Payer: Medicare Other

## 2011-10-10 ENCOUNTER — Encounter (HOSPITAL_COMMUNITY): Payer: Self-pay | Admitting: *Deleted

## 2011-10-10 ENCOUNTER — Other Ambulatory Visit: Payer: Self-pay

## 2011-10-10 HISTORY — PX: TEE WITHOUT CARDIOVERSION: SHX5443

## 2011-10-10 LAB — BASIC METABOLIC PANEL
CO2: 27 mEq/L (ref 19–32)
Calcium: 10.2 mg/dL (ref 8.4–10.5)
Chloride: 106 mEq/L (ref 96–112)
Creatinine, Ser: 1.28 mg/dL (ref 0.50–1.35)
GFR calc Af Amer: 64 mL/min — ABNORMAL LOW (ref >90)
GFR calc non Af Amer: 55 mL/min — ABNORMAL LOW (ref >90)
Glucose, Bld: 173 mg/dL — ABNORMAL HIGH (ref 70–99)
Potassium: 4 mEq/L (ref 3.5–5.1)
Sodium: 142 mEq/L (ref 135–145)

## 2011-10-10 LAB — GLUCOSE, CAPILLARY
Glucose-Capillary: 183 mg/dL — ABNORMAL HIGH (ref 70–99)
Glucose-Capillary: 214 mg/dL — ABNORMAL HIGH (ref 70–99)
Glucose-Capillary: 124 mg/dL — ABNORMAL HIGH (ref 70–99)

## 2011-10-10 LAB — CBC
MCHC: 34.2 g/dL (ref 30.0–36.0)
MCV: 79.4 fL (ref 78.0–100.0)
Platelets: 195 10*3/uL (ref 150–400)
Platelets: 216 10*3/uL (ref 150–400)
RBC: 4.42 MIL/uL (ref 4.22–5.81)
RDW: 15.4 % (ref 11.5–15.5)
WBC: 7.8 10*3/uL (ref 4.0–10.5)
WBC: 7.5 10*3/uL (ref 4.0–10.5)

## 2011-10-10 LAB — DIFFERENTIAL
Basophils Absolute: 0 10*3/uL (ref 0.0–0.1)
Lymphocytes Relative: 27 % (ref 12–46)
Lymphs Abs: 2 10*3/uL (ref 0.7–4.0)
Neutro Abs: 4.8 10*3/uL (ref 1.7–7.7)
Neutrophils Relative %: 64 % (ref 43–77)

## 2011-10-10 LAB — MAGNESIUM: Magnesium: 1.8 mg/dL (ref 1.5–2.5)

## 2011-10-10 LAB — APTT: aPTT: 115 seconds — ABNORMAL HIGH (ref 24–37)

## 2011-10-10 SURGERY — ECHOCARDIOGRAM, TRANSESOPHAGEAL
Anesthesia: Moderate Sedation

## 2011-10-10 SURGERY — CARDIOVERSION
Anesthesia: LOCAL

## 2011-10-10 MED ORDER — SODIUM CHLORIDE 0.9 % IJ SOLN: 3.0000 mL | INTRAMUSCULAR | Status: DC | PRN

## 2011-10-10 MED ORDER — SODIUM CHLORIDE 0.9 % IV SOLN: 250.0000 mL | INTRAVENOUS | Status: DC

## 2011-10-10 MED ORDER — FUROSEMIDE 40 MG PO TABS
40.0000 mg | ORAL_TABLET | Freq: Two times a day (BID) | ORAL | Status: DC
  Administered 2011-10-11: 40 mg via ORAL
  Filled 2011-10-10 (×3): qty 1

## 2011-10-10 MED ORDER — SODIUM CHLORIDE 0.9 % IJ SOLN
3.0000 mL | Freq: Two times a day (BID) | INTRAMUSCULAR | Status: DC
  Administered 2011-10-10: 3 mL via INTRAVENOUS

## 2011-10-10 MED ORDER — FENTANYL CITRATE 0.05 MG/ML IJ SOLN: 250.0000 ug | Freq: Once | INTRAMUSCULAR | Status: DC

## 2011-10-10 MED ORDER — BUTAMBEN-TETRACAINE-BENZOCAINE 2-2-14 % EX AERO
INHALATION_SPRAY | CUTANEOUS | Status: DC | PRN
  Administered 2011-10-10: 2 via TOPICAL

## 2011-10-10 MED ORDER — FENTANYL CITRATE 0.05 MG/ML IJ SOLN
INTRAMUSCULAR | Status: DC | PRN
  Administered 2011-10-10: 25 ug via INTRAVENOUS
  Administered 2011-10-10: 50 ug via INTRAVENOUS

## 2011-10-10 MED ORDER — CARVEDILOL 6.25 MG PO TABS
6.2500 mg | ORAL_TABLET | Freq: Two times a day (BID) | ORAL | Status: DC
  Administered 2011-10-11: 6.25 mg via ORAL
  Filled 2011-10-10 (×3): qty 1

## 2011-10-10 MED ORDER — FENTANYL CITRATE 0.05 MG/ML IJ SOLN
INTRAMUSCULAR | Status: AC
  Filled 2011-10-10: qty 2

## 2011-10-10 MED ORDER — DEXTROSE 5 % IV SOLN
5.0000 mg/h | INTRAVENOUS | Status: DC
  Administered 2011-10-10: 5 mg/h via INTRAVENOUS
  Administered 2011-10-11: 10 mg/h via INTRAVENOUS
  Filled 2011-10-10: qty 100

## 2011-10-10 MED ORDER — MIDAZOLAM HCL 10 MG/2ML IJ SOLN
INTRAMUSCULAR | Status: AC
  Filled 2011-10-10: qty 2

## 2011-10-10 MED ORDER — MIDAZOLAM HCL 10 MG/2ML IJ SOLN: 10.0000 mg | Freq: Once | INTRAMUSCULAR | Status: DC

## 2011-10-10 MED ORDER — HYDROCORTISONE 1 % EX CREA: 1.0000 "application " | TOPICAL_CREAM | Freq: Three times a day (TID) | CUTANEOUS | Status: DC | PRN

## 2011-10-10 MED ORDER — MIDAZOLAM HCL 10 MG/2ML IJ SOLN
INTRAMUSCULAR | Status: DC | PRN
  Administered 2011-10-10: 2 mg via INTRAVENOUS
  Administered 2011-10-10: 1 mg via INTRAVENOUS

## 2011-10-10 MED ORDER — DILTIAZEM LOAD VIA INFUSION
15.0000 mg | Freq: Once | INTRAVENOUS | Status: DC
  Filled 2011-10-10: qty 15

## 2011-10-10 MED ORDER — SODIUM CHLORIDE 0.9 % IJ SOLN: 3.0000 mL | Freq: Two times a day (BID) | INTRAMUSCULAR | Status: DC

## 2011-10-10 MED ORDER — BENZOCAINE 20 % MT SOLN: 1.0000 "application " | OROMUCOSAL | Status: DC | PRN

## 2011-10-10 MED ORDER — SODIUM CHLORIDE 0.9 % IV SOLN: 250.0000 mL | INTRAVENOUS | Status: DC | PRN

## 2011-10-10 MED ORDER — SODIUM CHLORIDE 0.9 % IV SOLN: INTRAVENOUS | Status: DC

## 2011-10-10 NOTE — Progress Notes (Signed)
  Echocardiogram Echocardiogram Transesophageal has been performed.  Ernest Lara A 10/10/2011, 9:48 AM

## 2011-10-10 NOTE — CV Procedure (Signed)
INDICATIONS: atrial flutter  PROCEDURE:   Informed consent was obtained prior to the procedure. The risks, benefits and alternatives for the procedure were discussed and the patient comprehended these risks.  Risks include, but are not limited to, cough, sore throat, vomiting, nausea, somnolence, esophageal and stomach trauma or perforation, bleeding, low blood pressure, aspiration, pneumonia, infection, trauma to the teeth and death.    After a procedural time-out, the oropharynx was anesthetized with 20% benzocaine spray. The patient was given 3 mg versed and 75 mcg fentanyl for moderate sedation.   The transesophageal probe was inserted in the esophagus and stomach without difficulty and multiple views were obtained.  The patient was kept under observation until the patient left the procedure room.  The patient left the procedure room in stable condition.   Agitated microbubble saline contrast was not administered.  COMPLICATIONS:    There were no immediate complications.  FINDINGS:  Severe biatrial enlargement. Spontaneous echo contrast in the left atrium and reduce appendage emptying velocities, but no LA thrombus. Severely depressed left ventricular systolic function, EF 25-30% with global hypokinesis and marked LV dyssynchrony. Dilated and moderately depressed right ventricle. No meaningful valve abnormalities. Minimal aortic atherosclerosis.  RECOMMENDATIONS:   Needs cardioversion, but will delay until anesthesiology support is available. He developed recurrent mild apneic spells, Cheyne-Stokes breathing and mild desaturation with light sedation. CV scheduled for 9AM tomorrow.  Time Spent Directly with the Patient:    Corena Tilson 10/10/2011, 9:39 AM

## 2011-10-10 NOTE — Progress Notes (Signed)
ANTICOAGULATION CONSULT NOTE - Follow Up Consult  Pharmacy Consult for Heparin Indication:  New onset atrial fibrillation with RVR  No Known Allergies  Patient Measurements: Height: 6' 0.1" (183.1 cm) Weight: 283 lb 8.2 oz (128.6 kg) (scale c) IBW/kg (Calculated) : 77.83  Heparin Dosing Weight: 107 kg  Vital Signs: Temp: 98.6 F (37 C) (05/02 0500) Temp src: Oral (05/02 0500) BP: 127/71 mmHg (05/02 0500) Pulse Rate: 98  (05/02 0500)  Labs:  Ernest Lara 10/10/11 1610 10/09/11 0523 10/08/11 1743 10/08/11 1742 10/08/11 1015 10/08/11 0806 10/08/11 0659 10/08/11 0658  HGB 11.6* 11.7* -- -- -- -- -- --  HCT 33.9* 34.2* -- -- -- 38.0* -- --  PLT 195 211 -- -- -- -- -- 214  APTT -- -- -- -- -- -- -- 31  LABPROT 14.9 -- -- -- -- -- -- 14.0  INR 1.15 -- -- -- -- -- -- 1.06  HEPARINUNFRC 0.61 0.50 0.36 -- -- -- -- --  CREATININE 1.28 1.20 -- -- -- 1.10 -- --  CKTOTAL -- -- -- 244* 269* -- 296* --  CKMB -- -- -- 6.8* 7.8* -- 8.5* --  TROPONINI -- -- -- <0.30 <0.30 -- <0.30 --   Estimated Creatinine Clearance: 74.5 ml/min (by C-G formula based on Cr of 1.28).  Assessment: 70 yom on heparin for new onset afib and level is at goal. Patient noted for TEE with DCCV today with plans for Xarelto.   Goal of Therapy:  Heparin level 0.3-0.7 units/ml   Plan:  -Continue heparin at 1650 units/hr -Will follow progress  Harland German, Pharm D 10/10/2011 8:18 AM

## 2011-10-10 NOTE — OR Nursing (Signed)
Patient was sedated with 75 mcg Fentanyl and 3 mg of IV versed and exhibited sleep apnea with Chenye-stokes breathing.  [place] oxygen increased to 4 liters to maintain oxygen sat greater than 95%.

## 2011-10-10 NOTE — Progress Notes (Signed)
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 He felt congested and try to clear his throat this did not work he sat on the side of the bed he felt somewhat better from 4-5 but continued with shortness of breath. He denied Chest pain but was very diaphoretic initially which cleared. He had no lightheadedness no dizziness no syncope. On arrival to the emergency room he was found to be in atrial fib with rapid ventricular response he has a bundle branch block so initially it did look like possible ventricular tachycardia. He was given IV Cardizem with slowing of the heart rate to atrial fib with rates 110 to 130. Currently he remains in atrial fibrillation with a bundle-branch block rates 110 to 130 with when he stands to void his heart rate is increased to 140-150. Additionally his BNP was 850 and his chest x-ray revealed mild congestive heart failure. He had been given  Lasix. Echo done here at [facility] 10/08/11 found EF had dropped to 20-25%. ? Tachycardia induced.    Subjective: No complaints  Objective: Vital signs in last 24 hours: Temp:  [98.3 F (36.8 C)-98.6 F (37 C)] 98.6 F (37 C) (05/02 0500) Pulse Rate:  [98-108] 98  (05/02 0500) Resp:  [18-20] 20  (05/01 2107) BP: (102-137)/(67-88) 127/71 mmHg (05/02 0500) SpO2:  [96 %-97 %] 97 % (05/02 0500) Weight:  [128.6 kg (283 lb 8.2 oz)] 128.6 kg (283 lb 8.2 oz) (05/02 0500) Weight change: -1 kg (-2 lb 3.3 oz) Last BM Date: 10/08/11 Intake/Output from previous day: 1615 05/01 0701 - 05/02 0700 In: 960 [P.O.:960] Out: 2575 [Urine:2575] Intake/Output this shift:    PE: General: alert, oriented , no complaints Heart:S1S2 irreg irreg Lungs:clear Abd:+bs soft non tender Ext:no edema   Wt: from 129.6 to 128.6   Lab Results:  Basename 10/10/11 0608 10/09/11 0523  WBC 7.8 7.2  HGB 11.6* 11.7*  HCT 33.9*  34.2*  PLT 195 211   BMET  Basename 10/10/11 0608 10/09/11 0523  NA 138 139  K 3.9 3.8  CL 106 106  CO2 23 22  GLUCOSE 184* 167*  BUN 13 11  CREATININE 1.28 1.20  CALCIUM 10.2 9.8    Basename 10/08/11 1742 10/08/11 1015  TROPONINI <0.30 <0.30    Lab Results  Component Value Date   CHOL 87 10/09/2011   HDL 56 10/09/2011   LDLCALC 15 10/09/2011   TRIG 82 10/09/2011   CHOLHDL 1.6 10/09/2011   Lab Results  Component Value Date   HGBA1C 7.5* 10/08/2011     Lab Results  Component Value Date   TSH 1.883 10/08/2011    Hepatic Function Panel  Basename 10/08/11 0658  PROT 7.1  ALBUMIN 3.7  AST 20  ALT 26  ALKPHOS 69  BILITOT 0.8  BILIDIR --  IBILI --    Basename 10/09/11 0523  CHOL 87   No results found for this basename: PROTIME in the last 72 hours    EKG: Orders placed during the hospital encounter of 10/08/11  . EKG 12-LEAD  . EKG 12-LEAD  . ED EKG  . ED EKG  . EKG 12-LEAD  . EKG 12-LEAD  . EKG 12-LEAD  . EKG 12-LEAD  . EKG 12-LEAD  . EKG 12-LEAD  . EKG 12-LEAD  . EKG 12-LEAD  . EKG 12-LEAD    Studies/Results: No results found.  Medications: I  have reviewed the patient's current medications.    Marland Kitchen amiodarone  400 mg Oral TID  . aspirin EC  81 mg Oral Daily  . atorvastatin  40 mg Oral q1800  . benazepril  40 mg Oral Daily  . doxazosin  8 mg Oral QHS  . furosemide  40 mg Intravenous Q12H  . insulin aspart  0-5 Units Subcutaneous QHS  . insulin aspart  0-9 Units Subcutaneous TID WC  . linagliptin  5 mg Oral Daily  . potassium chloride  20 mEq Oral Daily  . DISCONTD: sodium chloride  3 mL Intravenous Q12H   Assessment/Plan: Patient Active Problem List  Diagnoses  . Shortness of breath, acute, awakened from sleep, 10/08/11  . Atrial fibrillation with RVR, new onset, 10/08/11  . DM (diabetes mellitus), type 2   . HTN (hypertension)  . CHF (congestive heart failure), mild CHF secondary to tachycardia  . Cardiomyopathy, poss. related to tachycardia  , new, on echo 10/08/11 EF 20-25%   PLAN:Pro BNP continues to decrease now 514 from 800. New Afib with RVR, Amiodarone added. With any activity HR back up.  LA is severely dilated.  Also plan for TEE and DCCV today with Dr. Royann Shivers .  TEE in Endo and DCCV in cath lab. Discussed with Dr. Royann Shivers.   NPO after midnight. IV Heparin. Continues on IV Cardizem. We added coreg yest at 3.125 BID for CHF and cardiomyopathy which is new. Pt. Is on ACE. Dietician discussed 2 gm na diet and Diabetic diet with pt.  Pt had been on coreg but no longer on list?  Resume ? With low EF.  If hypotensive ? Decrease ACE?   Anticoagulation with heparin currently. Plan for Xarelto 20 mg daily after cardioversion.  Negative cardiac enzymes. No hx. Of CAD and neg. nuc study in 2006. Plan outpt. nuc study.  TSH -WNL at 2.08, hgb A1C 7.5, Glucovance on hold, he is on SSI.   EKG with LBBB, aflutter with variable av block rate at 98. Tele with Aflutter occ PVCs .     LOS: 2 days   INGOLD,LAURA R 10/10/2011, 7:58 AM   I have seen and examined the patient along with [facility] R, NP.  I have reviewed the chart, notes and new data.  I agree with PNP's note.  Key new complaints: no dyspnea or angina Key examination changes: still in AFlutter Key new findings / data: BNP already lower.  PLAN: Risks and benefits of TEE guided cardioversion reviewed in detail and he agrees to proceed. Reevaluate for CAD w outpt. [facility]. Recheck echo for EF after 3 months of normal rhythm. Meanwhile, gradually add carvedilol (as HR allows) and continue benazepril. Possibly add aldactone. If EF still low after 3 months, may be a candidate for CRT (+/- D).  Thurmon Fair, MD, [facility] [phone] 10/10/2011, 8:51 AM

## 2011-10-11 ENCOUNTER — Inpatient Hospital Stay (HOSPITAL_COMMUNITY): Payer: Medicare Other | Admitting: Certified Registered Nurse Anesthetist

## 2011-10-11 ENCOUNTER — Encounter (HOSPITAL_COMMUNITY)
Admission: EM | Disposition: A | Discharge status: Home / Self Care | Payer: Self-pay | Source: Home / Self Care | Attending: Internal Medicine

## 2011-10-11 ENCOUNTER — Encounter (HOSPITAL_COMMUNITY): Payer: Self-pay | Admitting: Cardiology

## 2011-10-11 ENCOUNTER — Encounter (HOSPITAL_COMMUNITY): Payer: Self-pay | Admitting: Certified Registered Nurse Anesthetist

## 2011-10-11 ENCOUNTER — Encounter (HOSPITAL_COMMUNITY): Payer: Self-pay | Admitting: Cardiovascular Disease

## 2011-10-11 DIAGNOSIS — G4733 Obstructive sleep apnea (adult) (pediatric): Secondary | ICD-10-CM | POA: Diagnosis present

## 2011-10-11 HISTORY — PX: CARDIOVERSION: SHX1299

## 2011-10-11 LAB — BASIC METABOLIC PANEL
Chloride: 105 mEq/L (ref 96–112)
GFR calc Af Amer: 61 mL/min — ABNORMAL LOW (ref >90)
Potassium: 3.8 mEq/L (ref 3.5–5.1)
Sodium: 138 mEq/L (ref 135–145)

## 2011-10-11 LAB — CBC
Platelets: 194 10*3/uL (ref 150–400)
RDW: 15.4 % (ref 11.5–15.5)
WBC: 6.5 10*3/uL (ref 4.0–10.5)

## 2011-10-11 LAB — HEPARIN LEVEL (UNFRACTIONATED): Heparin Unfractionated: 0.46 IU/mL (ref 0.30–0.70)

## 2011-10-11 LAB — GLUCOSE, CAPILLARY: Glucose-Capillary: 193 mg/dL — ABNORMAL HIGH (ref 70–99)

## 2011-10-11 SURGERY — CARDIOVERSION
Anesthesia: General | Wound class: Clean

## 2011-10-11 MED ORDER — ASPIRIN 81 MG PO TBEC: 81.0000 mg | DELAYED_RELEASE_TABLET | Freq: Every day | ORAL | Status: AC

## 2011-10-11 MED ORDER — RIVAROXABAN 10 MG PO TABS
20.0000 mg | ORAL_TABLET | Freq: Every day | ORAL | Status: DC
  Administered 2011-10-11: 20 mg via ORAL
  Filled 2011-10-11: qty 2

## 2011-10-11 MED ORDER — DILTIAZEM HCL 90 MG PO TABS
90.0000 mg | ORAL_TABLET | Freq: Three times a day (TID) | ORAL | Status: DC
  Filled 2011-10-11 (×3): qty 1

## 2011-10-11 MED ORDER — RIVAROXABAN 20 MG PO TABS: 20.0000 mg | ORAL_TABLET | Freq: Every day | ORAL | Status: DC

## 2011-10-11 MED ORDER — FUROSEMIDE 40 MG PO TABS: 40.0000 mg | ORAL_TABLET | Freq: Every day | ORAL | Status: DC

## 2011-10-11 MED ORDER — PROPOFOL 10 MG/ML IV BOLUS
INTRAVENOUS | Status: DC | PRN
  Administered 2011-10-11: 60 mg via INTRAVENOUS

## 2011-10-11 MED ORDER — SODIUM CHLORIDE 0.9 % IV SOLN
INTRAVENOUS | Status: DC | PRN
  Administered 2011-10-11: 09:00:00 via INTRAVENOUS

## 2011-10-11 MED ORDER — AMIODARONE HCL 400 MG PO TABS: 400.0000 mg | ORAL_TABLET | Freq: Three times a day (TID) | ORAL | Status: DC

## 2011-10-11 MED ORDER — POTASSIUM CHLORIDE CRYS ER 10 MEQ PO TBCR: 10.0000 meq | EXTENDED_RELEASE_TABLET | Freq: Every day | ORAL | Status: DC

## 2011-10-11 MED ORDER — RIVAROXABAN 10 MG PO TABS: 20.0000 mg | ORAL_TABLET | Freq: Every day | ORAL | Status: DC

## 2011-10-11 MED ORDER — CARVEDILOL 6.25 MG PO TABS: 6.2500 mg | ORAL_TABLET | Freq: Two times a day (BID) | ORAL | Status: DC

## 2011-10-11 NOTE — Transfer of Care (Signed)
Immediate Anesthesia Transfer of Care Note  Patient: Ernest Lara  Procedure(s) Performed: Procedure(s) (LRB): CARDIOVERSION (N/A)  Patient Location: Nursing Unit 4705  Anesthesia Type: MAC  Level of Consciousness: awake, alert  and oriented  Airway & Oxygen Therapy: Patient Spontanous Breathing and Patient connected to nasal cannula oxygen  Post-op Assessment: Report given to PACU RN and Post -op Vital signs reviewed and stable  Post vital signs: Reviewed and stable  Complications: No apparent anesthesia complications

## 2011-10-11 NOTE — Discharge Summary (Signed)
Physician Discharge Summary  Patient ID: Ernest Lara MRN: [id number] DOB/AGE: June 22, 1940 71 y.o.  Admit date: 10/08/2011 Discharge date: 10/11/2011  Discharge Diagnoses:  Principal Problem:  *Shortness of breath, acute, awakened from sleep, 10/08/11, secondary to tachycardia and acute systolic heart failure Active Problems:  Atrial fibrillation with RVR, new onset, 10/08/11, DCCV to SR on 10/11/11  Acute systolic CHF (congestive heart failure), new onset, could be related to tachycardia  Cardiomyopathy, poss. related to tachycardia , new, on echo 10/08/11 EF 20-25%  HTN (hypertension)  OSA (obstructive sleep apnea), pt with apnea during procedures  DM (diabetes mellitus), type 2    Discharged Condition: good  PROCEDURES:  10/10/2011 transesophageal echocardiogram by Dr. Royann Shivers 10/11/2011 cardioversion by Dr. 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 He felt congested and try to clear his throat this did not work he sat on the side of the bed he felt somewhat better from 4-5 AM but continued with shortness of breath. He denied Chest pain but was very diaphoretic initially which cleared. He had no lightheadedness no dizziness no syncope. On arrival to the emergency room he was found to be in atrial fib with rapid ventricular response he has a bundle branch block so initially it did look like possible ventricular tachycardia. He was given IV Cardizem with slowing of the heart rate to atrial fib with rates 110 to 130.  Patient was admitted to telemetry bed and IV heparin was also started.  Corag 3.125 was added to his medical regimen and his Norvasc was stopped.  We also added Lasix 40 mg IV every 12 for mild congestive heart failure initially  thought to be Rate related.  2-D echo was done:  Left ventricle: The cavity size was normal. There was moderate concentric hypertrophy. Systolic function was severely reduced. The estimated ejection fraction was in the range of 20% to 25%. The study is not technically sufficient to allow evaluation of LV diastolic function. The E/e' ratio is >20, suggesting markedly elevated LV filling pressure. - Mitral valve: Calcified annulus. Trivial regurgitation. - Left atrium: Severely dilated (43 ml/m2). - Tricuspid valve: Trivial regurgitation. - Pulmonary arteries: PA peak pressure: 23mm Hg + right atrial pressure(S  Due to new diagnosis of cardiomyopathy and acute systolic heart failure, Lasix was continued and patient was arranged for TEE.  EF continued at 25-30%.  Patient was then planned for cardioversion.  The patient's Coreg was increased to 6.25 mg daily and with significantly dilated left atrium patient was placed on amiodarone.  His amiodarone will be 400 mg 3 times a day for one week and beginning on May 9 he will decrease to 400 mg twice a day for 2 weeks and then Began 400 mg daily.  Patient was placed on Xarelto For anticoagulation.  Patient was successfully Cardioverted to sinus rhythm with a large first-degree block of 320 ms.   At discharge vital signs were stable, patient Ambulated without problems, And was ready for discharge home.  During hospitalization he was noted to have sleep apnea during his procedures.  He has been scheduled for an outpatient sleep study that our office will call and arrange for him.  Additionally to rule out ischemic disease he has been scheduled for Steffanie Dunn in our office.  Dietary saw the patient for 2 g sodium diet as well as diabetic diet.  Consults: None  Significant Diagnostic Studies:  Lab at discharge  Sodium 138 potassium 3.8 chloride 105 CO2 24 even 15 creatinine 1.33 calcium 9.8 glucose 173  LFTs during hospitalization were  normal  Cardiac enzymes were negative for myocardial infarction  Pro BNP on admission 895, at discharge 514  Hemoglobin A1c 7.5  TSH 1.88  Hemoglobin 11.1 hematocrit 32.9 WBC 6.5 platelets 194  Initial EKG atrial flutter with rapid ventricular response and left bundle branch block  EKG at discharge sinus rhythm first degree AV block PR 320 ms and left bundle branch block heart rate 70  Chest x-ray on admission Multifocal patchy opacities, possibly reflecting mild interstitial edema, underlying infection not excluded.  Followup chest X. Ray 10/10/2011 Mild patchy bilateral lower lobe opacities, atelectasis versus pneumonia. Stable cardiomegaly with small bilateral pleural effusions. No frank interstitial edema.  Patient had no fever and no elevated white count this was felt to be related to edema.   Discharge Exam: Blood pressure 112/70, pulse 73, temperature 97.8 F (36.6 C), temperature source Oral, resp. rate 20, height 6' 0.1" (1.831 m), weight 128.3 kg (282 lb 13.6 oz), SpO2 92.00%.   General appearance: alert, cooperative and no distress  Lungs: clear to auscultation bilaterally  Heart: regular rate and rhythm, S1, S2 normal, no murmur, click, rub or gallop  Extremities: 1+ Left LEE none on the right.  Pulses: 2+ and symmetric    Disposition: HOME   Medication List  As of 10/11/2011  3:24 PM   STOP taking these medications         amLODipine 10 MG tablet         TAKE these medications         amiodarone 400 MG tablet   Commonly known as: PACERONE   Take 1 tablet (400 mg total) by mouth 3 (three) times daily.      aspirin 81 MG EC tablet   Take 1 tablet (81 mg total) by mouth daily.      benazepril 40 MG tablet   Commonly known as: LOTENSIN   Take 40 mg by mouth daily.      carvedilol 6.25 MG tablet   Commonly known as: COREG   Take 1 tablet (6.25 mg total) by mouth 2 (two) times daily with a meal.      doxazosin 8 MG tablet   Commonly known as: CARDURA     Take 8 mg by mouth at bedtime.      furosemide 40 MG tablet   Commonly known as: LASIX   Take 1 tablet (40 mg total) by mouth daily.      glyBURIDE-metformin 5-500 MG per tablet   Commonly known as: GLUCOVANCE   Take 1 tablet by mouth 2 (two) times daily with a meal.      potassium chloride 10 MEQ tablet   Commonly known as: K-DUR,KLOR-CON   Take 1 tablet (10 mEq total) by mouth daily.      Rivaroxaban 20 MG Tabs   Take 20 mg by mouth daily with supper.      rosuvastatin 20 MG tablet   Commonly known as: CRESTOR   Take 20 mg by mouth at bedtime.      sitaGLIPtin 100 MG tablet   Commonly known as: JANUVIA   Take 100 mg by mouth daily.           Follow-up Information    Follow  up with Abelino Derrick, PA on 10/18/2011. (at 4:00 pm but at the [facility] address not [address].  Franky Macho is the PA )    Contact information:   [address] [place] [address] [phone]       Follow up with Thurmon Fair, MD on 11/01/2011. (at 1:15 for Stress test, do not eat or drink after 8:00 am that day for the study)    Contact information:   [address] [place] [address] [phone]       Follow up with Thurmon Fair, MD. (for sleep study the office will call with date and time.)    Contact information:   [address] [place] [address] [phone]        DISCHARGE INSTRUCTIONS:  Low salt --2Gm sodium  Diabetic diet  Weigh daily and record weight --if it increases more than 3 pounds in a day or 5 pounds in a week call our office for further instructions.  Amiodarone (Cardarone)  Take 400 mg three times a day for 1 week- then beginning 10/17/11 decrease to 400 mg twice a day for 2 weeks,  Then beginning 10/31/11 decrease to 400 mg once daily.  Call if any lightheadedness or dizziness.  You are scheduled for a stress test [facility]) and our office will call you  about  Signed: INGOLD,LAURA R 10/11/2011, 3:24 PM  I saw and examined the patient prior discharge this afternoon - several hours post DCCV.  He was feeling well, remaining in NSR with 1st Deg AVB.  Ambulating without difficulty.  He is discharged with PO Amiodarone load to avoid hypotension.  CCB d/c'd after DCCV with Coreg on board.  Long 1stDeg AVB -- will need to be monitored. Dosed with Xarelto after DCCV with ~2hr heparin overlap.    OP NST planned to evaluate for ischemia.  F/u planned with Dr. Royann Shivers.  Marykay Lex, M.D., M.S. [facility] [address] [place], [place]  [address]  [phone] Pager # [phone]  10/11/2011 4:16 PM

## 2011-10-11 NOTE — Interval H&P Note (Signed)
History and Physical Interval Note:  10/11/2011 9:31 AM  Ernest Lara  has presented today for surgery, with the diagnosis of AFIB.  The various methods of treatment have been discussed with the patient and family. After consideration of risks, benefits and other options for treatment, the patient has consented to  Procedure(s) (LRB): CARDIOVERSION (N/A) as a surgical intervention .  The patients' history has been reviewed, patient examined, no change in status, stable for surgery.  I have reviewed the patients' chart and labs.  Questions were answered to the patient's satisfaction.     Full progress note to follow.  HARDING,DAVID W 10/11/2011 9:32 AM

## 2011-10-11 NOTE — H&P (View-Only) (Signed)
71-year-old African American widowed male retired basketball coach from [employer] presents to the ER 10/08/11 after being awakened from sleep at 4 AM with shortness of breath. He felt congested and try to clear his throat this did not work he sat on the side of the bed he felt somewhat better from 4-5 but continued with shortness of breath. He denied Chest pain but was very diaphoretic initially which cleared. He had no lightheadedness no dizziness no syncope. On arrival to the emergency room he was found to be in atrial fib with rapid ventricular response he has a bundle branch block so initially it did look like possible ventricular tachycardia. He was given IV Cardizem with slowing of the heart rate to atrial fib with rates 110 to 130. Currently he remains in atrial fibrillation with a bundle-branch block rates 110 to 130 with when he stands to void his heart rate is increased to 140-150. Additionally his BNP was 850 and his chest x-ray revealed mild congestive heart failure. He had been given  Lasix. Echo done here at [facility] 10/08/11 found EF had dropped to 20-25%. ? Tachycardia induced.    Subjective: No complaints  Objective: Vital signs in last 24 hours: Temp:  [98.3 F (36.8 C)-98.6 F (37 C)] 98.6 F (37 C) (05/02 0500) Pulse Rate:  [98-108] 98  (05/02 0500) Resp:  [18-20] 20  (05/01 2107) BP: (102-137)/(67-88) 127/71 mmHg (05/02 0500) SpO2:  [96 %-97 %] 97 % (05/02 0500) Weight:  [128.6 kg (283 lb 8.2 oz)] 128.6 kg (283 lb 8.2 oz) (05/02 0500) Weight change: -1 kg (-2 lb 3.3 oz) Last BM Date: 10/08/11 Intake/Output from previous day: 1615 05/01 0701 - 05/02 0700 In: 960 [P.O.:960] Out: 2575 [Urine:2575] Intake/Output this shift:    PE: General: alert, oriented , no complaints Heart:S1S2 irreg irreg Lungs:clear Abd:+bs soft non tender Ext:no edema   Wt: from 129.6 to 128.6   Lab Results:  Basename 10/10/11 0608 10/09/11 0523  WBC 7.8 7.2  HGB 11.6* 11.7*  HCT 33.9*  34.2*  PLT 195 211   BMET  Basename 10/10/11 0608 10/09/11 0523  NA 138 139  K 3.9 3.8  CL 106 106  CO2 23 22  GLUCOSE 184* 167*  BUN 13 11  CREATININE 1.28 1.20  CALCIUM 10.2 9.8    Basename 10/08/11 1742 10/08/11 1015  TROPONINI <0.30 <0.30    Lab Results  Component Value Date   CHOL 87 10/09/2011   HDL 56 10/09/2011   LDLCALC 15 10/09/2011   TRIG 82 10/09/2011   CHOLHDL 1.6 10/09/2011   Lab Results  Component Value Date   HGBA1C 7.5* 10/08/2011     Lab Results  Component Value Date   TSH 1.883 10/08/2011    Hepatic Function Panel  Basename 10/08/11 0658  PROT 7.1  ALBUMIN 3.7  AST 20  ALT 26  ALKPHOS 69  BILITOT 0.8  BILIDIR --  IBILI --    Basename 10/09/11 0523  CHOL 87   No results found for this basename: PROTIME in the last 72 hours    EKG: Orders placed during the hospital encounter of 10/08/11  . EKG 12-LEAD  . EKG 12-LEAD  . ED EKG  . ED EKG  . EKG 12-LEAD  . EKG 12-LEAD  . EKG 12-LEAD  . EKG 12-LEAD  . EKG 12-LEAD  . EKG 12-LEAD  . EKG 12-LEAD  . EKG 12-LEAD  . EKG 12-LEAD    Studies/Results: No results found.  Medications: I   have reviewed the patient's current medications.    . amiodarone  400 mg Oral TID  . aspirin EC  81 mg Oral Daily  . atorvastatin  40 mg Oral q1800  . benazepril  40 mg Oral Daily  . doxazosin  8 mg Oral QHS  . furosemide  40 mg Intravenous Q12H  . insulin aspart  0-5 Units Subcutaneous QHS  . insulin aspart  0-9 Units Subcutaneous TID WC  . linagliptin  5 mg Oral Daily  . potassium chloride  20 mEq Oral Daily  . DISCONTD: sodium chloride  3 mL Intravenous Q12H   Assessment/Plan: Patient Active Problem List  Diagnoses  . Shortness of breath, acute, awakened from sleep, 10/08/11  . Atrial fibrillation with RVR, new onset, 10/08/11  . DM (diabetes mellitus), type 2   . HTN (hypertension)  . CHF (congestive heart failure), mild CHF secondary to tachycardia  . Cardiomyopathy, poss. related to tachycardia  , new, on echo 10/08/11 EF 20-25%   PLAN:Pro BNP continues to decrease now 514 from 800. New Afib with RVR, Amiodarone added. With any activity HR back up.  LA is severely dilated.  Also plan for TEE and DCCV today with Dr. Dima Mini .  TEE in Endo and DCCV in cath lab. Discussed with Dr. Samayah Novinger.   NPO after midnight. IV Heparin. Continues on IV Cardizem. We added coreg yest at 3.125 BID for CHF and cardiomyopathy which is new. Pt. Is on ACE. Dietician discussed 2 gm na diet and Diabetic diet with pt.  Pt had been on coreg but no longer on list?  Resume ? With low EF.  If hypotensive ? Decrease ACE?   Anticoagulation with heparin currently. Plan for Xarelto 20 mg daily after cardioversion.  Negative cardiac enzymes. No hx. Of CAD and neg. nuc study in 2006. Plan outpt. nuc study.  TSH -WNL at 2.08, hgb A1C 7.5, Glucovance on hold, he is on SSI.   EKG with LBBB, aflutter with variable av block rate at 98. Tele with Aflutter occ PVCs .     LOS: 2 days   INGOLD,LAURA R 10/10/2011, 7:58 AM   I have seen and examined the patient along with INGOLD,LAURA R, NP.  I have reviewed the chart, notes and new data.  I agree with PNP's note.  Key new complaints: no dyspnea or angina Key examination changes: still in AFlutter Key new findings / data: BNP already lower.  PLAN: Risks and benefits of TEE guided cardioversion reviewed in detail and he agrees to proceed. Reevaluate for CAD w outpt. [facility]. Recheck echo for EF after 3 months of normal rhythm. Meanwhile, gradually add carvedilol (as HR allows) and continue benazepril. Possibly add aldactone. If EF still low after 3 months, may be a candidate for CRT (+/- D).  Wilkins Elpers, MD, FACC [facility] [phone] 10/10/2011, 8:51 AM  

## 2011-10-11 NOTE — Progress Notes (Signed)
ANTICOAGULATION CONSULT NOTE - Follow Up Consult  Pharmacy Consult for Heparin Indication:  New onset atrial fibrillation with RVR  No Known Allergies  Patient Measurements: Height: 6' 0.1" (183.1 cm) Weight: 282 lb 13.6 oz (128.3 kg) (Scale A.) IBW/kg (Calculated) : 77.83  Heparin Dosing Weight: 107 kg  Vital Signs: Temp: 97.8 F (36.6 C) (05/03 0617) Temp src: Oral (05/03 0617) BP: 116/84 mmHg (05/03 0617) Pulse Rate: 115  (05/03 0617)  Labs:  Basename 10/11/11 0520 10/10/11 1926 10/10/11 1100 10/10/11 0608 10/09/11 0523 10/08/11 1742 10/08/11 1015  HGB 11.1* 12.0* -- -- -- -- --  HCT 32.9* 34.7* -- 33.9* -- -- --  PLT 194 216 -- 195 -- -- --  APTT -- -- 115* -- -- -- --  LABPROT -- -- -- 14.9 -- -- --  INR -- -- -- 1.15 -- -- --  HEPARINUNFRC 0.46 -- -- 0.61 0.50 -- --  CREATININE 1.33 1.50* -- 1.28 -- -- --  CKTOTAL -- -- -- -- -- 244* 269*  CKMB -- -- -- -- -- 6.8* 7.8*  TROPONINI -- -- -- -- -- <0.30 <0.30   Estimated Creatinine Clearance: 71.6 ml/min (by C-G formula based on Cr of 1.33).  Assessment: 70 yom on heparin for new onset afib and level is at goal. Patient noted s/p TEE 10/10/11 with DCCV today and plans for Xarelto.   Goal of Therapy:  Heparin level 0.3-0.7 units/ml   Plan:  -Continue heparin at 1650 units/hr -Will follow progress  Harland German, Pharm D 10/11/2011 8:29 AM

## 2011-10-11 NOTE — Anesthesia Postprocedure Evaluation (Signed)
  Anesthesia Post-op Note  Patient: Ernest Lara  Procedure(s) Performed: Procedure(s) (LRB): CARDIOVERSION (N/A)  Patient Location: 4705  Anesthesia Type: MAC  Level of Consciousness: awake, alert  and oriented  Airway and Oxygen Therapy: Patient Spontanous Breathing and Patient connected to nasal cannula oxygen  Post-op Pain: none  Post-op Assessment: Post-op Vital signs reviewed, Patient's Cardiovascular Status Stable, Respiratory Function Stable, Patent Airway and No signs of Nausea or vomiting  Post-op Vital Signs: Reviewed and stable  Complications: No apparent anesthesia complications

## 2011-10-11 NOTE — Progress Notes (Signed)
[facility]  Subjective: Just had DCCV.  A little groggy.  Objective: Vital signs in last 24 hours: Temp:  [97.8 F (36.6 C)-98.6 F (37 C)] 97.8 F (36.6 C) (05/03 0617) Pulse Rate:  [54-132] 115  (05/03 0617) Resp:  [13-27] 20  (05/03 0617) BP: (110-142)/(55-97) 116/84 mmHg (05/03 0617) SpO2:  [92 %-99 %] 92 % (05/03 0617) Weight:  [128.3 kg (282 lb 13.6 oz)] 128.3 kg (282 lb 13.6 oz) (05/03 0617) Last BM Date: 10/10/11  Intake/Output from previous day: 05/02 0701 - 05/03 0700 In: 1825.8 [P.O.:860; I.V.:961.8; IV Piggyback:4] Out: 2290 [Urine:2290] Intake/Output this shift: Total I/O In: 50 [I.V.:50] Out: -   Medications Current Facility-Administered Medications  Medication Dose Route Frequency Provider Last Rate Last Dose  . 0.9 %  sodium chloride infusion   Intravenous Continuous Nada Boozer, NP 10 mL/hr at 10/11/11 0623 10 mL/hr at 10/11/11 0981  . 0.9 %  sodium chloride infusion  250 mL Intravenous Continuous Nada Boozer, NP      . 0.9 %  sodium chloride infusion   Intravenous Continuous Nada Boozer, NP      . acetaminophen (TYLENOL) tablet 650 mg  650 mg Oral Q4H PRN Nada Boozer, NP      . ALPRAZolam Prudy Feeler) tablet 0.25 mg  0.25 mg Oral BID PRN Nada Boozer, NP      . amiodarone (PACERONE) tablet 400 mg  400 mg Oral TID Lennette Bihari, MD   400 mg at 10/10/11 2118  . aspirin EC tablet 81 mg  81 mg Oral Daily Nada Boozer, NP   81 mg at 10/10/11 1624  . atorvastatin (LIPITOR) tablet 40 mg  40 mg Oral q1800 Nada Boozer, NP   40 mg at 10/10/11 1733  . benazepril (LOTENSIN) tablet 40 mg  40 mg Oral Daily Nada Boozer, NP   40 mg at 10/10/11 1624  . carvedilol (COREG) tablet 6.25 mg  6.25 mg Oral BID WC [name], MD   6.25 mg at 10/11/11 0624  . doxazosin (CARDURA) tablet 8 mg  8 mg Oral QHS Nada Boozer, NP   8 mg at 10/10/11 2118  . furosemide (LASIX) tablet 40 mg  40 mg Oral BID [name], MD      . heparin ADULT infusion  100 units/mL (25000 units/250 mL)  1,650 Units/hr Intravenous Continuous Chrystie Nose, MD 16.5 mL/hr at 10/11/11 0123 1,650 Units/hr at 10/11/11 0123  . insulin aspart (novoLOG) injection 0-5 Units  0-5 Units Subcutaneous QHS Nada Boozer, NP      . insulin aspart (novoLOG) injection 0-9 Units  0-9 Units Subcutaneous TID WC Nada Boozer, NP   3 Units at 10/10/11 1733  . linagliptin (TRADJENTA) tablet 5 mg  5 mg Oral Daily Nada Boozer, NP   5 mg at 10/10/11 1734  . nitroGLYCERIN (NITROSTAT) SL tablet 0.4 mg  0.4 mg Sublingual Q5 Min x 3 PRN Nada Boozer, NP      . ondansetron [facility]) injection 4 mg  4 mg Intravenous Q6H PRN Nada Boozer, NP      . potassium chloride SA (K-DUR,KLOR-CON) CR tablet 20 mEq  20 mEq Oral Daily Nada Boozer, NP   20 mEq at 10/10/11 1626  . sodium chloride 0.9 % injection 3 mL  3 mL Intravenous Q12H Nada Boozer, NP   3 mL at 10/10/11 2118  . sodium chloride 0.9 % injection 3 mL  3 mL Intravenous PRN Nada Boozer, NP      .  zolpidem (AMBIEN) tablet 5 mg  5 mg Oral QHS PRN Nada Boozer, NP      . DISCONTD: 0.9 %  sodium chloride infusion  250 mL Intravenous PRN [name], MD      . DISCONTD: benzocaine (HURRICAINE) 20 % mouth spray 1 application  1 application Mouth/Throat PRN Thurmon Fair, MD      . DISCONTD: butamben-tetracaine-benzocaine (CETACAINE) spray    PRN Thurmon Fair, MD   2 spray at 10/10/11 0919  . DISCONTD: diltiazem (CARDIZEM) 1 mg/mL load via infusion 15 mg  15 mg Intravenous Once [name], MD      . DISCONTD: diltiazem (CARDIZEM) 100 mg in dextrose 5 % 100 mL infusion  5-15 mg/hr Intravenous Titrated Gwyneth Sprout, MD   10 mg/hr at 10/10/11 0810  . DISCONTD: diltiazem (CARDIZEM) 100 mg in dextrose 5 % 100 mL infusion  5-15 mg/hr Intravenous Titrated [name], MD 10 mL/hr at 10/11/11 0624 10 mg/hr at 10/11/11 0624  . DISCONTD: fentaNYL (SUBLIMAZE) injection 250 mcg  250 mcg Intravenous Once Thurmon Fair, MD      . DISCONTD:  fentaNYL (SUBLIMAZE) injection    PRN Thurmon Fair, MD   25 mcg at 10/10/11 0924  . DISCONTD: furosemide (LASIX) injection 40 mg  40 mg Intravenous Q12H Nada Boozer, NP   40 mg at 10/10/11 1625  . DISCONTD: hydrocortisone cream 1 % 1 application  1 application Topical TID PRN Nada Boozer, NP      . DISCONTD: hydrocortisone cream 1 % 1 application  1 application Topical TID PRN Nada Boozer, NP      . DISCONTD: midazolam (VERSED) injection 10 mg  10 mg Intravenous Once Thurmon Fair, MD      . DISCONTD: midazolam (VERSED) injection    PRN Thurmon Fair, MD   1 mg at 10/10/11 0923  . DISCONTD: sodium chloride 0.9 % injection 3 mL  3 mL Intravenous Q12H [name], MD      . DISCONTD: sodium chloride 0.9 % injection 3 mL  3 mL Intravenous PRN Thurmon Fair, MD       Facility-Administered Medications Ordered in Other Encounters  Medication Dose Route Frequency Provider Last Rate Last Dose  . 0.9 %  sodium chloride infusion    Continuous PRN Margaree Mackintosh, CRNA      . propofol (DIPRIVAN) 10 mg/mL bolus/IV push    PRN Margaree Mackintosh, CRNA   60 mg at 10/11/11 0930    PE: General appearance: alert, cooperative and no distress Lungs: clear to auscultation bilaterally Heart: regular rate and rhythm, S1, S2 normal, no murmur, click, rub or gallop Extremities: 1+ Left LEE none on the right. Pulses: 2+ and symmetric  Lab Results:   Basename 10/11/11 0520 10/10/11 1926 10/10/11 0608  WBC 6.5 7.5 7.8  HGB 11.1* 12.0* 11.6*  HCT 32.9* 34.7* 33.9*  PLT 194 216 195   BMET  Basename 10/11/11 0520 10/10/11 1926 10/10/11 0608  NA 138 142 138  K 3.8 4.0 3.9  CL 105 105 106  CO2 24 27 23   GLUCOSE 173* 173* 184*  BUN 15 15 13   CREATININE 1.33 1.50* 1.28  CALCIUM 9.8 9.8 10.2   PT/INR  Basename 10/10/11 0608  LABPROT 14.9  INR 1.15   Cholesterol  Basename 10/09/11 0523  CHOL 87    Assessment/Plan   Active Problems:  Shortness of breath, acute, awakened from sleep,  10/08/11  Atrial fibrillation with RVR, new onset, 10/08/11  DM (diabetes mellitus), type  2   HTN (hypertension)  Acute systolic CHF (congestive heart failure), new onset, could be related to tachycardia  Cardiomyopathy, poss. related to tachycardia , new, on echo 10/08/11 EF 20-25%  Plan:  S/P DCCV to NSR with first degree AV block 30 mins ago.  Pt doing well.   Will switch to PO diltiazem.   BP 100/45 - 149/65.      LOS: 3 days    Elis Rawlinson W 10/11/2011 9:58 AM

## 2011-10-11 NOTE — Progress Notes (Signed)
Nursing Note: Pt post cardioversion, pt stable and verbal. Pt has no complaints at this time and no sign of distress. Pt's vital signs stable at 115/78 and pulse 71, saturation 100% 2 liters [place] . EKG done post cardioversion, MD confirmed EKG. Will continue to monitor pt. Ernest Lara Scientist, clinical (histocompatibility and immunogenetics).

## 2011-10-11 NOTE — Anesthesia Procedure Notes (Signed)
Procedure Name: MAC Date/Time: 10/11/2011 9:30 AM Performed by: Margaree Mackintosh Pre-anesthesia Checklist: Patient identified, Emergency Drugs available, Suction available, Patient being monitored and Timeout performed Patient Re-evaluated:Patient Re-evaluated prior to inductionOxygen Delivery Method: Ambu bag Preoxygenation: Pre-oxygenation with 100% oxygen Intubation Type: IV induction Ventilation: Mask ventilation without difficulty Dental Injury: Teeth and Oropharynx as per pre-operative assessment

## 2011-10-11 NOTE — Anesthesia Postprocedure Evaluation (Signed)
  Anesthesia Post-op Note  Patient: Ernest Lara  Procedure(s) Performed: Procedure(s) (LRB): CARDIOVERSION (N/A)  Patient Location: 4007  Anesthesia Type: General  Level of Consciousness: awake and alert   Airway and Oxygen Therapy: Patient Spontanous Breathing and Patient connected to nasal cannula oxygen  Post-op Pain: none  Post-op Assessment: Post-op Vital signs reviewed and Patient's Cardiovascular Status Stable  Post-op Vital Signs: stable  Complications: No apparent anesthesia complications

## 2011-10-11 NOTE — Discharge Instructions (Signed)
Low salt --2Gm sodium  Diabetic diet  Weigh daily and record weight --if it increases more than 3 pounds in a day or 5 pounds in a week call our office for further instructions.  Amiodarone (Cardarone)  Take 400 mg three times a day for 1 week- then beginning 10/17/11 decrease to 400 mg twice a day for 2 weeks,  Then beginning 10/31/11 decrease to 400 mg once daily.  Call if any lightheadedness or dizziness.  You are scheduled for a stress test [facility]) and our office will call you about a sleep study.   Atrial Fibrillation Your caregiver has diagnosed you with atrial fibrillation (AFib). The heart normally beats very regularly; AFib is a type of irregular heartbeat. The heart rate may be faster or slower than normal. This can prevent your heart from pumping as well as it should. AFib can be constant (chronic) or intermittent (paroxysmal). CAUSES  Atrial fibrillation may be caused by:  Heart disease, including heart attack, coronary artery disease, heart failure, diseases of the heart valves, and others.   Blood clot in the lungs (pulmonary embolism).   Pneumonia or other infections.   Chronic lung disease.   Thyroid disease.   Toxins. These include alcohol, some medications (such as decongestant medications or diet pills), and caffeine.  In some people, no cause for AFib can be found. This is referred to as Lone Atrial Fibrillation. SYMPTOMS   Palpitations or a fluttering in your chest.   A vague sense of chest discomfort.   Shortness of breath.   Sudden onset of lightheadedness or weakness.  Sometimes, the first sign of AFib can be a complication of the condition. This could be a stroke or heart failure. DIAGNOSIS  Your description of your condition may make your caregiver suspicious of atrial fibrillation. Your caregiver will examine your pulse to determine if fibrillation is present. An EKG (electrocardiogram) will confirm the diagnosis. Further testing may help  determine what caused you to have atrial fibrillation. This may include chest x-ray, echocardiogram, blood tests, or CT scans. PREVENTION  If you have previously had atrial fibrillation, your caregiver may advise you to avoid substances known to cause the condition (such as stimulant medications, and possibly caffeine or alcohol). You may be advised to use medications to prevent recurrence. Proper treatment of any underlying condition is important to help prevent recurrence. PROGNOSIS  Atrial fibrillation does tend to become a chronic condition over time. It can cause significant complications (see below). Atrial fibrillation is not usually immediately life-threatening, but it can shorten your life expectancy. This seems to be worse in women. If you have lone atrial fibrillation and are under 18 years old, the risk of complications is very low, and life expectancy is not shortened. RISKS AND COMPLICATIONS  Complications of atrial fibrillation can include stroke, chest pain, and heart failure. Your caregiver will recommend treatments for the atrial fibrillation, as well as for any underlying conditions, to help minimize risk of complications. TREATMENT  Treatment for AFib is divided into several categories:  Treatment of any underlying condition.   Converting you out of AFib into a regular (sinus) rhythm.   Controlling rapid heart rate.   Prevention of blood clots and stroke.  Medications and procedures are available to convert your atrial fibrillation to sinus rhythm. However, recent studies have shown that this may not offer you any advantage, and cardiac experts are continuing research and debate on this topic. More important is controlling your rapid heartbeat. The rapid heartbeat causes  more symptoms, and places strain on your heart. Your caregiver will advise you on the use of medications that can control your heart rate. Atrial fibrillation is a strong stroke risk. You can lessen this risk  by taking blood thinning medications such as Coumadin (warfarin), or sometimes aspirin. These medications need close monitoring by your caregiver. Over-medication can cause bleeding. Too little medication may not protect against stroke. HOME CARE INSTRUCTIONS   If your caregiver prescribed medicine to make your heartbeat more normally, take as directed.   If blood thinners were prescribed by your caregiver, take EXACTLY as directed.   Perform blood tests EXACTLY as directed.   Quit smoking. Smoking increases your cardiac and lung (pulmonary) risks.   DO NOT drink alcohol.   DO NOT drink caffeinated drinks (e.g. coffee, soda, chocolate, and leaf teas). You may drink decaffeinated coffee, soda or tea.   If you are overweight, you should choose a reduced calorie diet to lose weight. Please see a registered dietitian if you need more information about healthy weight loss. DO NOT USE DIET PILLS as they may aggravate heart problems.   If you have other heart problems that are causing AFib, you may need to eat a low salt, fat, and cholesterol diet. Your caregiver will tell you if this is necessary.   Exercise every day to improve your physical fitness. Stay active unless advised otherwise.   If your caregiver has given you a follow-up appointment, it is very important to keep that appointment. Not keeping the appointment could result in heart failure or stroke. If there is any problem keeping the appointment, you must call back to this facility for assistance.  SEEK MEDICAL CARE IF:  You notice a change in the rate, rhythm or strength of your heartbeat.   You develop an infection or any other change in your overall health status.  SEEK IMMEDIATE MEDICAL CARE IF:   You develop chest pain, abdominal pain, sweating, weakness or feel sick to your stomach (nausea).   You develop shortness of breath.   You develop swollen feet and ankles.   You develop dizziness, numbness, or weakness of your  face or limbs, or any change in vision or speech.  MAKE SURE YOU:   Understand these instructions.   Will watch your condition.   Will get help right away if you are not doing well or get worse.  Document Released: 05/27/2005 Document Revised: 05/16/2011 Document Reviewed: 12/30/2007 [facility] Patient Information 2012 [place], [place].Atrial Fibrillation Your caregiver has diagnosed you with atrial fibrillation (AFib). The heart normally beats very regularly; AFib is a type of irregular heartbeat. The heart rate may be faster or slower than normal. This can prevent your heart from pumping as well as it should. AFib can be constant (chronic) or intermittent (paroxysmal). CAUSES  Atrial fibrillation may be caused by:  Heart disease, including heart attack, coronary artery disease, heart failure, diseases of the heart valves, and others.   Blood clot in the lungs (pulmonary embolism).   Pneumonia or other infections.   Chronic lung disease.   Thyroid disease.   Toxins. These include alcohol, some medications (such as decongestant medications or diet pills), and caffeine.  In some people, no cause for AFib can be found. This is referred to as Lone Atrial Fibrillation. SYMPTOMS   Palpitations or a fluttering in your chest.   A vague sense of chest discomfort.   Shortness of breath.   Sudden onset of lightheadedness or weakness.  Sometimes, the first  sign of AFib can be a complication of the condition. This could be a stroke or heart failure. DIAGNOSIS  Your description of your condition may make your caregiver suspicious of atrial fibrillation. Your caregiver will examine your pulse to determine if fibrillation is present. An EKG (electrocardiogram) will confirm the diagnosis. Further testing may help determine what caused you to have atrial fibrillation. This may include chest x-ray, echocardiogram, blood tests, or CT scans. PREVENTION  If you have previously had atrial fibrillation,  your caregiver may advise you to avoid substances known to cause the condition (such as stimulant medications, and possibly caffeine or alcohol). You may be advised to use medications to prevent recurrence. Proper treatment of any underlying condition is important to help prevent recurrence. PROGNOSIS  Atrial fibrillation does tend to become a chronic condition over time. It can cause significant complications (see below). Atrial fibrillation is not usually immediately life-threatening, but it can shorten your life expectancy. This seems to be worse in women. If you have lone atrial fibrillation and are under 44 years old, the risk of complications is very low, and life expectancy is not shortened. RISKS AND COMPLICATIONS  Complications of atrial fibrillation can include stroke, chest pain, and heart failure. Your caregiver will recommend treatments for the atrial fibrillation, as well as for any underlying conditions, to help minimize risk of complications. TREATMENT  Treatment for AFib is divided into several categories:  Treatment of any underlying condition.   Converting you out of AFib into a regular (sinus) rhythm.   Controlling rapid heart rate.   Prevention of blood clots and stroke.  Medications and procedures are available to convert your atrial fibrillation to sinus rhythm. However, recent studies have shown that this may not offer you any advantage, and cardiac experts are continuing research and debate on this topic. More important is controlling your rapid heartbeat. The rapid heartbeat causes more symptoms, and places strain on your heart. Your caregiver will advise you on the use of medications that can control your heart rate. Atrial fibrillation is a strong stroke risk. You can lessen this risk by taking blood thinning medications such as Coumadin (warfarin), or sometimes aspirin. These medications need close monitoring by your caregiver. Over-medication can cause bleeding. Too  little medication may not protect against stroke. HOME CARE INSTRUCTIONS   If your caregiver prescribed medicine to make your heartbeat more normally, take as directed.   If blood thinners were prescribed by your caregiver, take EXACTLY as directed.   Perform blood tests EXACTLY as directed.   Quit smoking. Smoking increases your cardiac and lung (pulmonary) risks.   DO NOT drink alcohol.   DO NOT drink caffeinated drinks (e.g. coffee, soda, chocolate, and leaf teas). You may drink decaffeinated coffee, soda or tea.   If you are overweight, you should choose a reduced calorie diet to lose weight. Please see a registered dietitian if you need more information about healthy weight loss. DO NOT USE DIET PILLS as they may aggravate heart problems.   If you have other heart problems that are causing AFib, you may need to eat a low salt, fat, and cholesterol diet. Your caregiver will tell you if this is necessary.   Exercise every day to improve your physical fitness. Stay active unless advised otherwise.   If your caregiver has given you a follow-up appointment, it is very important to keep that appointment. Not keeping the appointment could result in heart failure or stroke. If there is any problem keeping the  appointment, you must call back to this facility for assistance.  SEEK MEDICAL CARE IF:  You notice a change in the rate, rhythm or strength of your heartbeat.   You develop an infection or any other change in your overall health status.  SEEK IMMEDIATE MEDICAL CARE IF:   You develop chest pain, abdominal pain, sweating, weakness or feel sick to your stomach (nausea).   You develop shortness of breath.   You develop swollen feet and ankles.   You develop dizziness, numbness, or weakness of your face or limbs, or any change in vision or speech.  MAKE SURE YOU:   Understand these instructions.   Will watch your condition.   Will get help right away if you are not doing well  or get worse.  Document Released: 05/27/2005 Document Revised: 05/16/2011 Document Reviewed: 12/30/2007 [facility] Patient Information 2012 [place], [place].

## 2011-10-11 NOTE — Progress Notes (Addendum)
   CARE MANAGEMENT NOTE HEART FAILURE  10/11/2011   Patient:  Ernest Lara, Ernest Lara   Account Number:  [id number]    Date Initiated:  10/11/2011  Documentation initiated by:  GRAVES-BIGELOW,Evette Diclemente  Subjective/Objective Assessment:   Pt admitted with SOB. Pt s/p cardioversion. Plan for home on xarelto when stable.  Pt has family support. CM has benefits check in process. Will make pt aware once completed.   Action/Plan:   Medicaiton  is available at [facility] on [address]. Co pay cost will be 40.00. Please write original Rx  for 31 day supply with refills. Thanks   Anticipated DC Date:  10/13/2011  Anticipated DC Plan:  HOME/SELF CARE  DC Planning Services:  CM consult    Choice offered to / List presented to:          Status of service:  In process, will continue to follow  Medicare Important Message Given:   (If response is "NO", the following Medicare IM given date fields will be blank) Date Medicare IM Given:   Date Additional Medicare IM Given:    Discharge Disposition:    Per UR Regulation:    If discussed at Long Length of Stay Meetings, dates discussed:    Comments:   10-11-11 1138 Tomi Bamberger, RN,BSN [phone] CM will continue to monitor for disposition needs.   Initial CM contact:     By:   Initial CSW contact:     By:      Is this an INP Readmission < 30 days:   (If "YES" please see readmission information at the bottom of note)  Patient living status prior to this admission:    Patient setting prior to this admission:    Comorbid conditions being treated that contributed to this admission:    CHF Readmission Risk:        Was referral made to [facility]:    Is the patient's PCP the same as attending:   PCP:    Readmission < 30 Days If pt has HH, did they contact the agency before going to the ED:   Name of [facility] agency:    Was the follow-up physician visit scheduled prior to discharge:    Did the patient follow-up with the physician  prior to this readmission:    Was there HF Clinic visits prior to readmission:    Were there ED visits between admissions:    Readmit type:    If unscheduled and related indicate reason for readmit:

## 2011-10-11 NOTE — Progress Notes (Signed)
Nursing Note: Pt to be discharged per MD's order. IV and Cardiac monitor discontinued per MD's order. Pt received all discharge information. Pt verbalizes understanding. Will continue to monitor pt. Courvoisier Hamblen Scientist, clinical (histocompatibility and immunogenetics).

## 2011-10-11 NOTE — CV Procedure (Signed)
NAME:  Wynne Jury   MRN: [id number] DOB:  11/13/1940   ADMIT DATE: 10/08/2011  Procedure: Electrical Cardioversion Indications:  Atrial Fibrillation  Procedure Details:  Consent: Risks of procedure as well as the alternatives and risks of each were explained to the (patient/caregiver).  Consent for procedure obtained.  Time Out: Verified patient identification, verified procedure, site/side was marked, verified correct patient position, special equipment/implants available, medications/allergies/relevent history reviewed, required imaging and test results available.  Performed  Patient placed on cardiac monitor, pulse oximetry, supplemental oxygen as necessary.  Sedation given: Propofol 60 mg per Dr. Noreene Larsson from Anesthesiology Pacer pads placed anterior and posterior chest.  Cardioverted 1 time(s).  Cardioverted at 200J.  Evaluation: Findings: Post procedure EKG shows: NSR, with 1st Deg AVB Complications: None Patient did tolerate procedure well.  Time Spent Directly with the Patient:  20 minutes   Carolee Channell W, M.D., M.S. [facility] [address] [place], [place]  [address]  [phone]  10/11/2011 9:50 AM

## 2011-10-11 NOTE — Anesthesia Preprocedure Evaluation (Addendum)
Anesthesia Evaluation  Patient identified by MRN, date of birth, ID band Patient awake    Reviewed: Allergy & Precautions, H&P , NPO status , Patient's Chart, lab work & pertinent test results  Airway Mallampati: III TM Distance: >3 FB Neck ROM: Full    Dental  (+) Teeth Intact and Dental Advisory Given   Pulmonary shortness of breath and at rest,  New onset SOB with afib RVR breath sounds clear to auscultation        Cardiovascular hypertension, Pt. on medications +CHF + dysrhythmias Atrial Fibrillation Rhythm:Irregular Rate:Normal  EF 20% on 5/2, on cardizem for rate control  Scheduled today for cardioversion SOB develeoped suddenly in the night with development of Afib w/ RVR on 4/30.    Neuro/Psych negative neurological ROS     GI/Hepatic negative GI ROS, Neg liver ROS,   Endo/Other  Diabetes mellitus-, Well Controlled, Type 2, Oral Hypoglycemic Agents  Renal/GU negative Renal ROS     Musculoskeletal negative musculoskeletal ROS (+)   Abdominal (+) + obese,  Abdomen: soft.    Peds  Hematology negative hematology ROS (+)   Anesthesia Other Findings   Reproductive/Obstetrics                       Anesthesia Physical Anesthesia Plan  ASA: III  Anesthesia Plan: General   Post-op Pain Management:    Induction: Intravenous  Airway Management Planned: Mask  Additional Equipment:   Intra-op Plan:   Post-operative Plan:   Informed Consent: I have reviewed the patients History and Physical, chart, labs and discussed the procedure including the risks, benefits and alternatives for the proposed anesthesia with the patient or authorized representative who has indicated his/her understanding and acceptance.     Plan Discussed with: CRNA and Surgeon  Anesthesia Plan Comments:         Anesthesia Quick Evaluation

## 2011-10-11 NOTE — Preoperative (Signed)
Beta Blockers   Reason not to administer Beta Blockers:Not Applicable 

## 2011-10-12 ENCOUNTER — Encounter (HOSPITAL_COMMUNITY): Payer: Self-pay | Admitting: *Deleted

## 2011-10-12 ENCOUNTER — Emergency Department (HOSPITAL_COMMUNITY): Payer: Medicare Other

## 2011-10-12 ENCOUNTER — Other Ambulatory Visit: Payer: Self-pay

## 2011-10-12 ENCOUNTER — Observation Stay (HOSPITAL_COMMUNITY)
Admission: EM | Admit: 2011-10-12 | Discharge: 2011-10-13 | Disposition: A | Discharge status: Home / Self Care | Payer: Medicare Other | Attending: Internal Medicine | Admitting: Internal Medicine

## 2011-10-12 DIAGNOSIS — J9600 Acute respiratory failure, unspecified whether with hypoxia or hypercapnia: Principal | ICD-10-CM | POA: Insufficient documentation

## 2011-10-12 DIAGNOSIS — I429 Cardiomyopathy, unspecified: Secondary | ICD-10-CM | POA: Diagnosis present

## 2011-10-12 DIAGNOSIS — I5022 Chronic systolic (congestive) heart failure: Secondary | ICD-10-CM | POA: Insufficient documentation

## 2011-10-12 DIAGNOSIS — R0602 Shortness of breath: Secondary | ICD-10-CM | POA: Insufficient documentation

## 2011-10-12 DIAGNOSIS — I480 Paroxysmal atrial fibrillation: Secondary | ICD-10-CM

## 2011-10-12 DIAGNOSIS — I129 Hypertensive chronic kidney disease with stage 1 through stage 4 chronic kidney disease, or unspecified chronic kidney disease: Secondary | ICD-10-CM | POA: Insufficient documentation

## 2011-10-12 DIAGNOSIS — I428 Other cardiomyopathies: Secondary | ICD-10-CM | POA: Insufficient documentation

## 2011-10-12 DIAGNOSIS — N183 Chronic kidney disease, stage 3 unspecified: Secondary | ICD-10-CM | POA: Insufficient documentation

## 2011-10-12 DIAGNOSIS — I509 Heart failure, unspecified: Secondary | ICD-10-CM | POA: Insufficient documentation

## 2011-10-12 DIAGNOSIS — E119 Type 2 diabetes mellitus without complications: Secondary | ICD-10-CM | POA: Insufficient documentation

## 2011-10-12 DIAGNOSIS — J810 Acute pulmonary edema: Secondary | ICD-10-CM | POA: Insufficient documentation

## 2011-10-12 DIAGNOSIS — J984 Other disorders of lung: Secondary | ICD-10-CM | POA: Diagnosis present

## 2011-10-12 DIAGNOSIS — G4733 Obstructive sleep apnea (adult) (pediatric): Secondary | ICD-10-CM | POA: Insufficient documentation

## 2011-10-12 DIAGNOSIS — I447 Left bundle-branch block, unspecified: Secondary | ICD-10-CM | POA: Insufficient documentation

## 2011-10-12 DIAGNOSIS — I10 Essential (primary) hypertension: Secondary | ICD-10-CM | POA: Diagnosis present

## 2011-10-12 DIAGNOSIS — T462X5A Adverse effect of other antidysrhythmic drugs, initial encounter: Secondary | ICD-10-CM | POA: Insufficient documentation

## 2011-10-12 HISTORY — DX: Chronic kidney disease, stage 3 (moderate): N18.3

## 2011-10-12 HISTORY — DX: Chronic kidney disease, stage 3 unspecified: N18.30

## 2011-10-12 LAB — COMPREHENSIVE METABOLIC PANEL
ALT: 22 U/L (ref 0–53)
AST: 18 U/L (ref 0–37)
Albumin: 3.3 g/dL — ABNORMAL LOW (ref 3.5–5.2)
Alkaline Phosphatase: 59 U/L (ref 39–117)
CO2: 25 mEq/L (ref 19–32)
Chloride: 102 mEq/L (ref 96–112)
GFR calc non Af Amer: 41 mL/min — ABNORMAL LOW (ref >90)
Potassium: 4.1 mEq/L (ref 3.5–5.1)
Sodium: 137 mEq/L (ref 135–145)
Total Bilirubin: 0.9 mg/dL (ref 0.3–1.2)

## 2011-10-12 LAB — POCT I-STAT, CHEM 8
Creatinine, Ser: 1.7 mg/dL — ABNORMAL HIGH (ref 0.50–1.35)
HCT: 39 % (ref 39.0–52.0)
Hemoglobin: 13.3 g/dL (ref 13.0–17.0)
Potassium: 3.8 mEq/L (ref 3.5–5.1)
Sodium: 139 mEq/L (ref 135–145)
TCO2: 25 mmol/L (ref 0–100)

## 2011-10-12 LAB — GLUCOSE, CAPILLARY
Glucose-Capillary: 184 mg/dL — ABNORMAL HIGH (ref 70–99)
Glucose-Capillary: 172 mg/dL — ABNORMAL HIGH (ref 70–99)

## 2011-10-12 LAB — PRO B NATRIURETIC PEPTIDE: Pro B Natriuretic peptide (BNP): 336.9 pg/mL — ABNORMAL HIGH (ref 0–125)

## 2011-10-12 LAB — CBC
MCH: 27.3 pg (ref 26.0–34.0)
MCHC: 34.7 g/dL (ref 30.0–36.0)
MCHC: 34.2 g/dL (ref 30.0–36.0)
MCV: 79.9 fL (ref 78.0–100.0)
Platelets: 205 10*3/uL (ref 150–400)
Platelets: 190 10*3/uL (ref 150–400)
RBC: 4.07 MIL/uL — ABNORMAL LOW (ref 4.22–5.81)
RDW: 15.2 % (ref 11.5–15.5)
RDW: 15.3 % (ref 11.5–15.5)
WBC: 11.3 10*3/uL — ABNORMAL HIGH (ref 4.0–10.5)

## 2011-10-12 LAB — DIFFERENTIAL
Basophils Absolute: 0 10*3/uL (ref 0.0–0.1)
Basophils Relative: 0 % (ref 0–1)
Basophils Relative: 0 % (ref 0–1)
Eosinophils Absolute: 0.1 10*3/uL (ref 0.0–0.7)
Eosinophils Relative: 1 % (ref 0–5)
Lymphocytes Relative: 14 % (ref 12–46)
Lymphs Abs: 1 10*3/uL (ref 0.7–4.0)
Neutro Abs: 9.1 10*3/uL — ABNORMAL HIGH (ref 1.7–7.7)
Neutrophils Relative %: 81 % — ABNORMAL HIGH (ref 43–77)

## 2011-10-12 LAB — POCT I-STAT TROPONIN I

## 2011-10-12 LAB — PROTIME-INR
INR: 2.02 — ABNORMAL HIGH (ref 0.00–1.49)
Prothrombin Time: 23.2 seconds — ABNORMAL HIGH (ref 11.6–15.2)

## 2011-10-12 MED ORDER — ASPIRIN EC 81 MG PO TBEC
81.0000 mg | DELAYED_RELEASE_TABLET | Freq: Every day | ORAL | Status: DC
  Administered 2011-10-12 – 2011-10-13 (×2): 81 mg via ORAL
  Filled 2011-10-12 (×2): qty 1

## 2011-10-12 MED ORDER — POTASSIUM CHLORIDE CRYS ER 10 MEQ PO TBCR
10.0000 meq | EXTENDED_RELEASE_TABLET | Freq: Every day | ORAL | Status: DC
  Administered 2011-10-12 – 2011-10-13 (×2): 10 meq via ORAL
  Filled 2011-10-12 (×2): qty 1

## 2011-10-12 MED ORDER — ACETAMINOPHEN 650 MG RE SUPP: 650.0000 mg | Freq: Four times a day (QID) | RECTAL | Status: DC | PRN

## 2011-10-12 MED ORDER — CARVEDILOL 6.25 MG PO TABS
6.2500 mg | ORAL_TABLET | Freq: Two times a day (BID) | ORAL | Status: DC
  Administered 2011-10-12 – 2011-10-13 (×3): 6.25 mg via ORAL
  Filled 2011-10-12 (×5): qty 1

## 2011-10-12 MED ORDER — BENAZEPRIL HCL 40 MG PO TABS
40.0000 mg | ORAL_TABLET | Freq: Every day | ORAL | Status: DC
  Administered 2011-10-12 – 2011-10-13 (×2): 40 mg via ORAL
  Filled 2011-10-12 (×2): qty 1

## 2011-10-12 MED ORDER — ALUM & MAG HYDROXIDE-SIMETH 200-200-20 MG/5ML PO SUSP: 30.0000 mL | Freq: Four times a day (QID) | ORAL | Status: DC | PRN

## 2011-10-12 MED ORDER — SODIUM CHLORIDE 0.9 % IV SOLN: 250.0000 mL | INTRAVENOUS | Status: DC | PRN

## 2011-10-12 MED ORDER — SODIUM CHLORIDE 0.9 % IJ SOLN
3.0000 mL | Freq: Two times a day (BID) | INTRAMUSCULAR | Status: DC
  Administered 2011-10-12 (×2): 3 mL via INTRAVENOUS

## 2011-10-12 MED ORDER — DOXAZOSIN MESYLATE 8 MG PO TABS
8.0000 mg | ORAL_TABLET | Freq: Every day | ORAL | Status: DC
  Administered 2011-10-12: 8 mg via ORAL
  Filled 2011-10-12 (×2): qty 1

## 2011-10-12 MED ORDER — NITROGLYCERIN IN D5W 200-5 MCG/ML-% IV SOLN
2.0000 ug/min | Freq: Once | INTRAVENOUS | Status: AC
  Administered 2011-10-12: 5 ug/min via INTRAVENOUS
  Filled 2011-10-12: qty 250

## 2011-10-12 MED ORDER — GLYBURIDE-METFORMIN 5-500 MG PO TABS: 1.0000 | ORAL_TABLET | Freq: Two times a day (BID) | ORAL | Status: DC

## 2011-10-12 MED ORDER — SODIUM CHLORIDE 0.9 % IJ SOLN: 3.0000 mL | INTRAMUSCULAR | Status: DC | PRN

## 2011-10-12 MED ORDER — ONDANSETRON HCL 4 MG PO TABS: 4.0000 mg | ORAL_TABLET | Freq: Four times a day (QID) | ORAL | Status: DC | PRN

## 2011-10-12 MED ORDER — LINAGLIPTIN 5 MG PO TABS
5.0000 mg | ORAL_TABLET | Freq: Every day | ORAL | Status: DC
  Administered 2011-10-12 – 2011-10-13 (×2): 5 mg via ORAL
  Filled 2011-10-12 (×2): qty 1

## 2011-10-12 MED ORDER — ONDANSETRON HCL 4 MG/2ML IJ SOLN: 4.0000 mg | Freq: Four times a day (QID) | INTRAMUSCULAR | Status: DC | PRN

## 2011-10-12 MED ORDER — ACETAMINOPHEN 325 MG PO TABS: 650.0000 mg | ORAL_TABLET | Freq: Four times a day (QID) | ORAL | Status: DC | PRN

## 2011-10-12 MED ORDER — GLYBURIDE 5 MG PO TABS
5.0000 mg | ORAL_TABLET | Freq: Two times a day (BID) | ORAL | Status: DC
  Administered 2011-10-12 – 2011-10-13 (×3): 5 mg via ORAL
  Filled 2011-10-12 (×5): qty 1

## 2011-10-12 MED ORDER — FUROSEMIDE 40 MG PO TABS
40.0000 mg | ORAL_TABLET | Freq: Two times a day (BID) | ORAL | Status: DC
  Administered 2011-10-12: 40 mg via ORAL
  Filled 2011-10-12 (×3): qty 1

## 2011-10-12 MED ORDER — FUROSEMIDE 80 MG PO TABS
80.0000 mg | ORAL_TABLET | Freq: Two times a day (BID) | ORAL | Status: DC
  Administered 2011-10-12 – 2011-10-13 (×2): 80 mg via ORAL
  Filled 2011-10-12 (×4): qty 1

## 2011-10-12 MED ORDER — FUROSEMIDE 10 MG/ML IJ SOLN
80.0000 mg | Freq: Once | INTRAMUSCULAR | Status: AC
  Administered 2011-10-12: 80 mg via INTRAVENOUS
  Filled 2011-10-12: qty 8

## 2011-10-12 MED ORDER — METFORMIN HCL 500 MG PO TABS
500.0000 mg | ORAL_TABLET | Freq: Two times a day (BID) | ORAL | Status: DC
  Administered 2011-10-12 – 2011-10-13 (×3): 500 mg via ORAL
  Filled 2011-10-12 (×5): qty 1

## 2011-10-12 MED ORDER — ATORVASTATIN CALCIUM 40 MG PO TABS
40.0000 mg | ORAL_TABLET | Freq: Every day | ORAL | Status: DC
  Administered 2011-10-12: 40 mg via ORAL
  Filled 2011-10-12 (×2): qty 1

## 2011-10-12 MED ORDER — RIVAROXABAN 10 MG PO TABS
20.0000 mg | ORAL_TABLET | Freq: Every day | ORAL | Status: DC
  Administered 2011-10-12: 20 mg via ORAL
  Filled 2011-10-12 (×2): qty 2

## 2011-10-12 MED ORDER — SODIUM CHLORIDE 0.9 % IJ SOLN
3.0000 mL | Freq: Two times a day (BID) | INTRAMUSCULAR | Status: DC
  Administered 2011-10-12: 3 mL via INTRAVENOUS

## 2011-10-12 NOTE — H&P (Addendum)
Ernest Lara is an 71 y.o. male.    Cardiologist: Dr. Royann Shivers PCP: Dr. 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 He felt congested and try to clear his throat this did not work he sat on the side of the bed he felt somewhat better from 4-5 but continued with shortness of breath. He denied Chest pain but was very diaphoretic initially which cleared. This is a similar presentation to what he had last week.  He was discharged earlier today after several days of diuresis and treatment for a new cardiomyopathy. This morning, he underwent cardioversion after a recent TEE demonstrated no evidence of left atrial thrombus. We have been electively loading him on by mouth amiodarone and he received 2 doses tonight. In the emergency room he was found to be profoundly hypoxic with a room air oxygen saturation of 72%. Chest x-ray revealed mild cardiomegaly and vascular congestion with bilateral central and bibasilar airspace opacities. There is a slightly elevated white blood cell count 11,300, however the BNP is lower at 336 down from 514.   Past Medical History  Diagnosis Date  . Hypertension   . Diabetes mellitus   . DM (diabetes mellitus), type 2  10/08/2011  . HTN (hypertension) 10/08/2011  . 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 Dysrhythmia 10/08/2011    atrial fibrilation  . CHF (congestive heart failure)     mild   . OSA (obstructive sleep apnea), pt with apnea during procedures 10/11/2011    Past Surgical History  Procedure Date  . No past surgeries   . Tee without cardioversion 10/10/2011    Procedure: TRANSESOPHAGEAL ECHOCARDIOGRAM (TEE);  Surgeon: Thurmon Fair, MD;  Location: [facility] ENDOSCOPY;  Service: Cardiovascular;  Laterality: N/A;    History reviewed. No pertinent family history. The  patient's mother died at 47 she may have had coronary issues but he's not sure what the cause of death was father died in 41 but was not cardiac issue. The patient has no siblings.  Social History:  reports that he has never smoked. He has never used smokeless tobacco. He reports that he drinks about .6 ounces of alcohol per week. He reports that he does not use illicit drugs.  He is widowed and has 4 children and 2 grandchildren. 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  He exercises by walking at least once a week and most often twice a week depending on the weather.  Allergies: No Known Allergies  Outpatient medications: Norvasc 10 mg by mouth daily. Benzapril 40 mg daily Cardura 8 mg daily at bedtime next number Glucovance 5/500 one tablet by mouth twice a day with meals. Crestor 20 mg one daily. Januvia 100 mg by mouth daily. He no longer takes an aspirin daily.  (Not in a hospital admission)  Results for orders placed during the hospital encounter of 10/12/11 (from the past 48 hour(s))  PRO B NATRIURETIC PEPTIDE     Status: Abnormal   Collection Time   10/12/11  1:14 AM      Component Value Range Comment   Pro B Natriuretic peptide (BNP) 336.9 (*) 0 - 125 (pg/mL)   CBC     Status: Abnormal   Collection Time   10/12/11  1:14 AM      Component Value Range Comment   WBC  11.3 (*) 4.0 - 10.5 (K/uL)    RBC 4.51  4.22 - 5.81 (MIL/uL)    Hemoglobin 12.4 (*) 13.0 - 17.0 (g/dL)    HCT 36.6 (*) 44.0 - 52.0 (%)    MCV 79.2  78.0 - 100.0 (fL)    MCH 27.5  26.0 - 34.0 (pg)    MCHC 34.7  30.0 - 36.0 (g/dL)    RDW 34.7  42.5 - 95.6 (%)    Platelets 205  150 - 400 (K/uL)   DIFFERENTIAL     Status: Abnormal   Collection Time   10/12/11  1:14 AM      Component Value Range Comment   Neutrophils Relative 81 (*) 43 - 77 (%)    Neutro Abs 9.1 (*) 1.7 - 7.7 (K/uL)    Lymphocytes Relative 14  12 - 46 (%)    Lymphs Abs 1.5  0.7 - 4.0 (K/uL)    Monocytes Relative 4  3 - 12 (%)     Monocytes Absolute 0.5  0.1 - 1.0 (K/uL)    Eosinophils Relative 1  0 - 5 (%)    Eosinophils Absolute 0.2  0.0 - 0.7 (K/uL)    Basophils Relative 0  0 - 1 (%)    Basophils Absolute 0.0  0.0 - 0.1 (K/uL)   PROTIME-INR     Status: Abnormal   Collection Time   10/12/11  1:14 AM      Component Value Range Comment   Prothrombin Time 23.2 (*) 11.6 - 15.2 (seconds)    INR 2.02 (*) 0.00 - 1.49    POCT I-STAT TROPONIN I     Status: Normal   Collection Time   10/12/11  1:26 AM      Component Value Range Comment   Troponin i, poc 0.02  0.00 - 0.08 (ng/mL)    Comment 3            POCT I-STAT, CHEM 8     Status: Abnormal   Collection Time   10/12/11  1:29 AM      Component Value Range Comment   Sodium 139  135 - 145 (mEq/L)    Potassium 3.8  3.5 - 5.1 (mEq/L)    Chloride 104  96 - 112 (mEq/L)    BUN 24 (*) 6 - 23 (mg/dL)    Creatinine, Ser 3.87 (*) 0.50 - 1.35 (mg/dL)    Glucose, Bld 564 (*) 70 - 99 (mg/dL)    Calcium, Ion 3.32  1.12 - 1.32 (mmol/L)    TCO2 25  0 - 100 (mmol/L)    Hemoglobin 13.3  13.0 - 17.0 (g/dL)    HCT 95.1  88.4 - 16.6 (%)    Dg Chest 2 View  10/10/2011  *RADIOLOGY REPORT*  Clinical Data: Follow up CHF  CHEST - 2 VIEW  Comparison: 10/08/2011  Findings: Mild patchy bilateral lower lobe opacities, atelectasis versus pneumonia. Suspected small bilateral pleural effusions.  No frank interstitial edema.  No pneumothorax.  Stable cardiomegaly.  Degenerative changes of the visualized thoracolumbar spine.  IMPRESSION: Mild patchy bilateral lower lobe opacities, atelectasis versus pneumonia.  Stable cardiomegaly with small bilateral pleural effusions.  No frank interstitial edema.  Original Report Authenticated By: Charline Bills, M.D.   Dg Chest Portable 1 View  10/12/2011  *RADIOLOGY REPORT*  Clinical Data: Shortness of breath and tachycardia.  PORTABLE CHEST - 1 VIEW  Comparison: Chest radiograph performed 10/10/2011  Findings: The lungs are well-aerated.  Vascular congestion is  noted, with bilateral  central and bibasilar airspace opacities, raising concern for pulmonary edema.  There is no evidence of pleural effusion or pneumothorax.  The cardiomediastinal silhouette is mildly enlarged.  No acute osseous abnormalities are seen.  IMPRESSION: Vascular congestion and mild cardiomegaly, with bilateral central and bibasilar airspace opacities, raising concern for pulmonary edema.  Original Report Authenticated By: Tonia Ghent, M.D.    ROS: General:No recent colds or fevers. Skin:No rashes or ulcers. HEENT:No blurred vision or double fashion. CV:No chest pain, no awareness of rapid heart rate. ZOX:WRUEAVWUJ of breath that began at 4 AM this morning none prior to that time GI:No diarrhea constipation or melena GU:No dysuria or hematuria WJ:XBJYNW joint pain or back pain Neuro:No lightheadedness, dizziness or syncope. Endo:Diabetes is well-controlled unknown thyroid status.   Blood pressure 131/60, pulse 85, temperature 98.3 F (36.8 C), temperature source Oral, resp. rate 23, SpO2 95.00%. PE:  General: Alert and oriented African American male currently in mild respiratory distress. Skin: Warm and dry brisk capillary refill. HEENT: Normocephalic, sclera clear. Neck: Supple no JVD no carotid bruits. 2+ carotid upstroke Heart: RRR, S1-S2, no S3, 1/6 SEM at LLSB Lungs: decreased breath sounds in the bases upper lobes are clear Abd: Positive bowel sounds soft nontender do not palpate liver spleen or masses Ext: Trace edema in the left ankle right without edema 1+ pedal pulses bilaterally, 2+ radial pulses bilaterally Neuro: Alert and oriented x3 follows commands, moves all extremities.   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 Shortness of breath, acute, awakened from sleep, 10/08/11, secondary to tachycardia and acute systolic heart failure  . Atrial  fibrillation with RVR, new onset, 10/08/11, DCCV to SR on 10/11/11  . DM (diabetes mellitus), type 2   . HTN (hypertension)  . Acute systolic CHF (congestive heart failure), new onset, could be related to tachycardia  . Cardiomyopathy, poss. related to tachycardia , new, on echo 10/08/11 EF 20-25%  . OSA (obstructive sleep apnea), pt with apnea during procedures   IMPRESSION:  1. Acute amiodarone-induced non-cardiogenic pulmonary edema 2. Chronic systolic heart failure, presumed non-ischemic, EF 20-30% 3. 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 Witnessed OSA - but not formally diagnosed with sleep study  PLAN:  1. This is not likely an acute CHF exacerbation.  He has been taking his medications as directed and on discharge today he was breathing normally and able to lie flat. His chest x-ray is improved markedly and his BNP is actually lower than on admission recently. He is getting an acute load of amiodarone and I suspect this is amiodarone-induced noncardiogenic pulmonary edema. This is a relatively rare complication however it has been seen before and could explain his significant hypoxia. There is no clear evidence for PE. He is in a sinus rhythm not tachycardic, which may support that diagnosis or a diagnosis of decompensated heart failure. He's also been recently heparinized and started on xarelto. Will plan to admit and continue diuresis and of course discontinued his amiodarone. He may qualify for an observation stay.  **Please note this should not be considered a re-admission for decompensated heart failure - this is more likely drug induced lung toxicity.  Chrystie Nose, MD, [facility] Attending Cardiologist [facility]  Zoeya Gramajo C 10/12/2011, 3:18 AM

## 2011-10-12 NOTE — ED Notes (Signed)
MD at bedside to discuss plan of care- cardiology to see patient in the ED

## 2011-10-12 NOTE — ED Notes (Signed)
MD at bedside. 

## 2011-10-12 NOTE — ED Notes (Signed)
D/c'd from 4700 today, was admitted for CHF. Was suppose to f/u next week. Sx returned tonight. C/o sob and chest fullness. Speaking in short phrases. Increased wob.

## 2011-10-12 NOTE — Progress Notes (Signed)
[facility]  DAILY PROGRESS NOTE   Subjective:  Considerable improvement in dyspnea. No orthopnea. O2 sat normalized. No chest pain. Most of diuresis occurred in ED - hard to quantitate accurately.  Objective:  Temp:  [98.3 F (36.8 C)-98.4 F (36.9 C)] 98.4 F (36.9 C) (05/04 0507) Pulse Rate:  [73-93] 74  (05/04 0507) Resp:  [17-31] 20  (05/04 0507) BP: (112-177)/(60-81) 112/68 mmHg (05/04 0507) SpO2:  [79 %-100 %] 100 % (05/04 0507) Weight:  [128.3 kg (282 lb 13.6 oz)-130.636 kg (288 lb)] 130.636 kg (288 lb) (05/04 0507) Weight change:   Intake/Output from previous day: 05/03 0701 - 05/04 0700 In: -  Out: 400 [Urine:400]  Intake/Output from this shift: Total I/O In: -  Out: 150 [Urine:150]  Medications: Current Facility-Administered Medications  Medication Dose Route Frequency Provider Last Rate Last Dose  . 0.9 %  sodium chloride infusion  250 mL Intravenous PRN Chrystie Nose, MD      . acetaminophen (TYLENOL) tablet 650 mg  650 mg Oral Q6H PRN Chrystie Nose, MD       Or  . acetaminophen (TYLENOL) suppository 650 mg  650 mg Rectal Q6H PRN Chrystie Nose, MD      . alum & mag hydroxide-simeth (MAALOX/MYLANTA) 200-200-20 MG/5ML suspension 30 mL  30 mL Oral Q6H PRN Chrystie Nose, MD      . aspirin EC tablet 81 mg  81 mg Oral Daily Chrystie Nose, MD      . atorvastatin (LIPITOR) tablet 40 mg  40 mg Oral q1800 Chrystie Nose, MD      . benazepril (LOTENSIN) tablet 40 mg  40 mg Oral Daily Chrystie Nose, MD      . carvedilol (COREG) tablet 6.25 mg  6.25 mg Oral BID WC Chrystie Nose, MD   6.25 mg at 10/12/11 0849  . doxazosin (CARDURA) tablet 8 mg  8 mg Oral QHS Chrystie Nose, MD      . furosemide (LASIX) injection 80 mg  80 mg Intravenous Once Shelda Jakes, MD   80 mg at 10/12/11 0152  . furosemide (LASIX) tablet 40 mg  40 mg Oral BID Chrystie Nose, MD   40 mg at 10/12/11 0849  . glyBURIDE (DIABETA) tablet 5 mg   5 mg Oral BID WC Chrystie Nose, MD   5 mg at 10/12/11 0849  . linagliptin (TRADJENTA) tablet 5 mg  5 mg Oral Daily Chrystie Nose, MD      . metFORMIN (GLUCOPHAGE) tablet 500 mg  500 mg Oral BID WC Chrystie Nose, MD   500 mg at 10/12/11 0850  . nitroGLYCERIN 0.2 mg/mL in dextrose 5 % infusion  2-200 mcg/min Intravenous Once Shelda Jakes, MD   5 mcg/min at 10/12/11 0152  . ondansetron (ZOFRAN) tablet 4 mg  4 mg Oral Q6H PRN Chrystie Nose, MD       Or  . ondansetron (ZOFRAN) injection 4 mg  4 mg Intravenous Q6H PRN Chrystie Nose, MD      . potassium chloride (K-DUR,KLOR-CON) CR tablet 10 mEq  10 mEq Oral Daily Chrystie Nose, MD      . rivaroxaban (XARELTO) tablet 20 mg  20 mg Oral Q supper Chrystie Nose, MD      . sodium chloride 0.9 % injection 3 mL  3 mL Intravenous Q12H Chrystie Nose, MD      . sodium chloride  0.9 % injection 3 mL  3 mL Intravenous Q12H Chrystie Nose, MD      . sodium chloride 0.9 % injection 3 mL  3 mL Intravenous PRN Chrystie Nose, MD      . DISCONTD: glyBURIDE-metformin (GLUCOVANCE) 5-500 MG per tablet 1 tablet  1 tablet Oral BID WC Chrystie Nose, MD       Facility-Administered Medications Ordered in Other Encounters  Medication Dose Route Frequency Provider Last Rate Last Dose  . heparin ADULT infusion 100 units/mL (25000 units/250 mL)  1,650 Units/hr Intravenous Continuous Benny Lennert, PHARMD 16.5 mL/hr at 10/11/11 0123 1,650 Units/hr at 10/11/11 0123  . DISCONTD: 0.9 %  sodium chloride infusion   Intravenous Continuous Nada Boozer, NP 10 mL/hr at 10/11/11 0623 10 mL/hr at 10/11/11 7829  . DISCONTD: 0.9 %  sodium chloride infusion  250 mL Intravenous Continuous Nada Boozer, NP      . DISCONTD: 0.9 %  sodium chloride infusion   Intravenous Continuous Nada Boozer, NP      . DISCONTD: 0.9 %  sodium chloride infusion    Continuous PRN Margaree Mackintosh, CRNA      . DISCONTD: acetaminophen (TYLENOL) tablet 650 mg  650 mg Oral Q4H  PRN Nada Boozer, NP      . DISCONTD: ALPRAZolam Prudy Feeler) tablet 0.25 mg  0.25 mg Oral BID PRN Nada Boozer, NP      . DISCONTD: amiodarone (PACERONE) tablet 400 mg  400 mg Oral TID Lennette Bihari, MD   400 mg at 10/11/11 1144  . DISCONTD: aspirin EC tablet 81 mg  81 mg Oral Daily Nada Boozer, NP   81 mg at 10/11/11 1142  . DISCONTD: atorvastatin (LIPITOR) tablet 40 mg  40 mg Oral q1800 Nada Boozer, NP   40 mg at 10/10/11 1733  . DISCONTD: benazepril (LOTENSIN) tablet 40 mg  40 mg Oral Daily Nada Boozer, NP   40 mg at 10/11/11 1142  . DISCONTD: carvedilol (COREG) tablet 6.25 mg  6.25 mg Oral BID WC Jaleia Hanke, MD   6.25 mg at 10/11/11 0624  . DISCONTD: diltiazem (CARDIZEM) 1 mg/mL load via infusion 15 mg  15 mg Intravenous Once Cheynne Virden, MD      . DISCONTD: diltiazem (CARDIZEM) 100 mg in dextrose 5 % 100 mL infusion  5-15 mg/hr Intravenous Titrated Tyja Gortney, MD 10 mL/hr at 10/11/11 0624 10 mg/hr at 10/11/11 0624  . DISCONTD: diltiazem (CARDIZEM) tablet 90 mg  90 mg Oral Q8H Dwana Melena, [place]      . DISCONTD: doxazosin (CARDURA) tablet 8 mg  8 mg Oral QHS Nada Boozer, NP   8 mg at 10/10/11 2118  . DISCONTD: furosemide (LASIX) tablet 40 mg  40 mg Oral BID Kristyl Athens, MD   40 mg at 10/11/11 1142  . DISCONTD: furosemide (LASIX) tablet 40 mg  40 mg Oral Daily Nada Boozer, NP      . DISCONTD: insulin aspart (novoLOG) injection 0-5 Units  0-5 Units Subcutaneous QHS Nada Boozer, NP      . DISCONTD: insulin aspart (novoLOG) injection 0-9 Units  0-9 Units Subcutaneous TID WC Nada Boozer, NP   2 Units at 10/11/11 1143  . DISCONTD: linagliptin (TRADJENTA) tablet 5 mg  5 mg Oral Daily Nada Boozer, NP   5 mg at 10/11/11 1143  . DISCONTD: nitroGLYCERIN (NITROSTAT) SL tablet 0.4 mg  0.4 mg Sublingual Q5 Min x 3 PRN Nada Boozer, NP      .  DISCONTD: ondansetron (ZOFRAN) injection 4 mg  4 mg Intravenous Q6H PRN Nada Boozer, NP      . DISCONTD: potassium chloride (K-DUR,KLOR-CON) CR tablet  10 mEq  10 mEq Oral Daily Nada Boozer, NP      . DISCONTD: potassium chloride SA (K-DUR,KLOR-CON) CR tablet 20 mEq  20 mEq Oral Daily Nada Boozer, NP   20 mEq at 10/11/11 1144  . DISCONTD: propofol (DIPRIVAN) 10 mg/mL bolus/IV push    PRN Margaree Mackintosh, CRNA   60 mg at 10/11/11 0930  . DISCONTD: rivaroxaban (XARELTO) tablet 20 mg  20 mg Oral Daily Nada Boozer, NP      . DISCONTD: rivaroxaban (XARELTO) tablet 20 mg  20 mg Oral Q supper Nada Boozer, NP   20 mg at 10/11/11 1406  . DISCONTD: sodium chloride 0.9 % injection 3 mL  3 mL Intravenous Q12H Nada Boozer, NP   3 mL at 10/10/11 2118  . DISCONTD: sodium chloride 0.9 % injection 3 mL  3 mL Intravenous PRN Nada Boozer, NP      . DISCONTD: zolpidem (AMBIEN) tablet 5 mg  5 mg Oral QHS PRN Nada Boozer, NP        Physical Exam: General appearance: alert, cooperative and no distress Neck: no adenopathy, no carotid bruit, supple, symmetrical, trachea midline, thyroid not enlarged, symmetric, no tenderness/mass/nodules and jugular veins hard to evaluate due to obesity Lungs: clear to auscultation bilaterally Heart: regular rate and rhythm, S1, S2 normal, no murmur, click, rub or gallop Abdomen: soft, non-tender; bowel sounds normal; no masses,  no organomegaly Extremities: extremities normal, atraumatic, no cyanosis or edema Pulses: 2+ and symmetric Skin: Skin color, texture, turgor normal. No rashes or lesions Neurologic: Grossly normal  Lab Results: Results for orders placed during the hospital encounter of 10/12/11 (from the past 48 hour(s))  PRO B NATRIURETIC PEPTIDE     Status: Abnormal   Collection Time   10/12/11  1:14 AM      Component Value Range Comment   Pro B Natriuretic peptide (BNP) 336.9 (*) 0 - 125 (pg/mL)   CBC     Status: Abnormal   Collection Time   10/12/11  1:14 AM      Component Value Range Comment   WBC 11.3 (*) 4.0 - 10.5 (K/uL)    RBC 4.51  4.22 - 5.81 (MIL/uL)    Hemoglobin 12.4 (*) 13.0 - 17.0 (g/dL)    HCT  16.1 (*) 09.6 - 52.0 (%)    MCV 79.2  78.0 - 100.0 (fL)    MCH 27.5  26.0 - 34.0 (pg)    MCHC 34.7  30.0 - 36.0 (g/dL)    RDW 04.5  40.9 - 81.1 (%)    Platelets 205  150 - 400 (K/uL)   DIFFERENTIAL     Status: Abnormal   Collection Time   10/12/11  1:14 AM      Component Value Range Comment   Neutrophils Relative 81 (*) 43 - 77 (%)    Neutro Abs 9.1 (*) 1.7 - 7.7 (K/uL)    Lymphocytes Relative 14  12 - 46 (%)    Lymphs Abs 1.5  0.7 - 4.0 (K/uL)    Monocytes Relative 4  3 - 12 (%)    Monocytes Absolute 0.5  0.1 - 1.0 (K/uL)    Eosinophils Relative 1  0 - 5 (%)    Eosinophils Absolute 0.2  0.0 - 0.7 (K/uL)    Basophils Relative 0  0 - 1 (%)  Basophils Absolute 0.0  0.0 - 0.1 (K/uL)   PROTIME-INR     Status: Abnormal   Collection Time   10/12/11  1:14 AM      Component Value Range Comment   Prothrombin Time 23.2 (*) 11.6 - 15.2 (seconds)    INR 2.02 (*) 0.00 - 1.49    POCT I-STAT TROPONIN I     Status: Normal   Collection Time   10/12/11  1:26 AM      Component Value Range Comment   Troponin i, poc 0.02  0.00 - 0.08 (ng/mL)    Comment 3            POCT I-STAT, CHEM 8     Status: Abnormal   Collection Time   10/12/11  1:29 AM      Component Value Range Comment   Sodium 139  135 - 145 (mEq/L)    Potassium 3.8  3.5 - 5.1 (mEq/L)    Chloride 104  96 - 112 (mEq/L)    BUN 24 (*) 6 - 23 (mg/dL)    Creatinine, Ser 1.61 (*) 0.50 - 1.35 (mg/dL)    Glucose, Bld 096 (*) 70 - 99 (mg/dL)    Calcium, Ion 0.45  1.12 - 1.32 (mmol/L)    TCO2 25  0 - 100 (mmol/L)    Hemoglobin 13.3  13.0 - 17.0 (g/dL)    HCT 40.9  81.1 - 91.4 (%)   GLUCOSE, CAPILLARY     Status: Abnormal   Collection Time   10/12/11  6:20 AM      Component Value Range Comment   Glucose-Capillary 184 (*) 70 - 99 (mg/dL)    Comment 1 Notify RN      Comment 2 Documented in Chart     COMPREHENSIVE METABOLIC PANEL     Status: Abnormal   Collection Time   10/12/11  8:00 AM      Component Value Range Comment   Sodium 137  135 - 145  (mEq/L)    Potassium 4.1  3.5 - 5.1 (mEq/L)    Chloride 102  96 - 112 (mEq/L)    CO2 25  19 - 32 (mEq/L)    Glucose, Bld 170 (*) 70 - 99 (mg/dL)    BUN 23  6 - 23 (mg/dL)    Creatinine, Ser 7.82 (*) 0.50 - 1.35 (mg/dL)    Calcium 9.5  8.4 - 10.5 (mg/dL)    Total Protein 6.4  6.0 - 8.3 (g/dL)    Albumin 3.3 (*) 3.5 - 5.2 (g/dL)    AST 18  0 - 37 (U/L)    ALT 22  0 - 53 (U/L)    Alkaline Phosphatase 59  39 - 117 (U/L)    Total Bilirubin 0.9  0.3 - 1.2 (mg/dL)    GFR calc non Af Amer 41 (*) >90 (mL/min)    GFR calc Af Amer 47 (*) >90 (mL/min)   CBC     Status: Abnormal   Collection Time   10/12/11  8:00 AM      Component Value Range Comment   WBC 7.5  4.0 - 10.5 (K/uL)    RBC 4.07 (*) 4.22 - 5.81 (MIL/uL)    Hemoglobin 11.1 (*) 13.0 - 17.0 (g/dL)    HCT 95.6 (*) 21.3 - 52.0 (%)    MCV 79.9  78.0 - 100.0 (fL)    MCH 27.3  26.0 - 34.0 (pg)    MCHC 34.2  30.0 - 36.0 (g/dL)  RDW 15.3  11.5 - 15.5 (%)    Platelets 190  150 - 400 (K/uL)   DIFFERENTIAL     Status: Abnormal   Collection Time   10/12/11  8:00 AM      Component Value Range Comment   Neutrophils Relative 80 (*) 43 - 77 (%)    Neutro Abs 6.0  1.7 - 7.7 (K/uL)    Lymphocytes Relative 13  12 - 46 (%)    Lymphs Abs 1.0  0.7 - 4.0 (K/uL)    Monocytes Relative 6  3 - 12 (%)    Monocytes Absolute 0.5  0.1 - 1.0 (K/uL)    Eosinophils Relative 1  0 - 5 (%)    Eosinophils Absolute 0.1  0.0 - 0.7 (K/uL)    Basophils Relative 0  0 - 1 (%)    Basophils Absolute 0.0  0.0 - 0.1 (K/uL)     Imaging: Dg Chest Portable 1 View  10/12/2011  *RADIOLOGY REPORT*  Clinical Data: Shortness of breath and tachycardia.  PORTABLE CHEST - 1 VIEW  Comparison: Chest radiograph performed 10/10/2011  Findings: The lungs are well-aerated.  Vascular congestion is noted, with bilateral central and bibasilar airspace opacities, raising concern for pulmonary edema.  There is no evidence of pleural effusion or pneumothorax.  The cardiomediastinal silhouette is  mildly enlarged.  No acute osseous abnormalities are seen.  IMPRESSION: Vascular congestion and mild cardiomegaly, with bilateral central and bibasilar airspace opacities, raising concern for pulmonary edema.  Original Report Authenticated By: Tonia Ghent, M.D.    Assessment:  1. Principal Problem: 2.  *Amiodarone pulmonary toxicity 3. Active Problems: 4.  DM (diabetes mellitus), type 2  5.  HTN (hypertension) 6.  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  OSA (obstructive sleep apnea), pt with apnea during procedures 8.   Plan:  1. Rapid improvement is in keeping with hydrostatic pulmonary edema, but the possibility of amiodarone toxicity is real. Aiodarone should not be administered again.   Carvedilol was also started and could also be responsible for CHF exacerbation.   Observe overnight. Continue ACEi, beta blocker and diuretics.  If he has early AFlutter recurrence, only antiarrhythmic option is dofetilide (would avoid dronedarone and sotalol due to CHF). Best long term option is RF ablation.   Refer for Sleep study. OSA may be underlying substrate for a flutter via cor pulmonale.   Time Spent Directly with Patient:  45 minutes  Length of Stay:  LOS: 0 days    Ernest Lara 10/12/2011, 9:16 AM

## 2011-10-12 NOTE — ED Provider Notes (Signed)
History     CSN: [id number]  Arrival date & time 10/12/11  0051   First MD Initiated Contact with Patient 10/12/11 0114      Chief Complaint  Patient presents with  . Shortness of Breath    (Consider location/radiation/quality/duration/timing/severity/associated sxs/prior treatment) Patient is a 71 y.o. male presenting with shortness of breath.  Shortness of Breath  The current episode started today. The onset was sudden. The problem has been unchanged. The problem is severe. The symptoms are relieved by nothing. The symptoms are aggravated by nothing. Associated symptoms include shortness of breath. Pertinent negatives include no chest pain, no chest pressure, no fever, no sore throat, no stridor, no cough and no wheezing.   Patient does discharge from the cardiology service after admission for atrial fibrillation and rapid heart rate underwent cardiac version also did have congestive heart failure. Patient was diuresis and discharged home on Friday just yesterday now at 3:00 in the afternoon. He is followed by [facility] heart and vascular cardiology. At midnight the patient had acute onset of shortness of breath no chest pain associated with it patient does not have a history of COPD. Patient's room air sat upon arrival was 79%. Patient was placed on 5 L nasal cannula oxygen which brought his saturation up above 96%. Past Medical History  Diagnosis Date  . Hypertension   . Diabetes mellitus   . DM (diabetes mellitus), type 2  10/08/2011  . HTN (hypertension) 10/08/2011  . 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 Dysrhythmia 10/08/2011    atrial fibrilation  . CHF (congestive heart failure)     mild   . OSA (obstructive sleep apnea), pt with apnea during procedures 10/11/2011    Past Surgical History  Procedure Date  . No past surgeries   . Tee without cardioversion 10/10/2011    Procedure: TRANSESOPHAGEAL ECHOCARDIOGRAM (TEE);  Surgeon: Thurmon Fair, MD;   Location: [facility] ENDOSCOPY;  Service: Cardiovascular;  Laterality: N/A;    History reviewed. No pertinent family history.  History  Substance Use Topics  . Smoking status: Never Smoker   . Smokeless tobacco: Never Used  . Alcohol Use: 0.6 oz/week    1 Shots of liquor per week     one drink per week"      Review of Systems  Constitutional: Negative for fever and chills.  HENT: Negative for sore throat and neck pain.   Eyes: Negative for redness.  Respiratory: Positive for shortness of breath. Negative for cough, wheezing and stridor.   Cardiovascular: Negative for chest pain and leg swelling.  Gastrointestinal: Negative for nausea, vomiting, abdominal pain and diarrhea.  Genitourinary: Negative for dysuria.  Musculoskeletal: Negative for back pain.  Skin: Negative for rash.  Neurological: Negative for headaches.  Hematological: Does not bruise/bleed easily.  Psychiatric/Behavioral: Negative for confusion.    Allergies  Review of patient's allergies indicates no known allergies.  Home Medications   Current Outpatient Rx  Name Route Sig Dispense Refill  . AMIODARONE HCL 400 MG PO TABS Oral Take 1 tablet (400 mg total) by mouth 3 (three) times daily. 90 tablet 4    On  10/17/11 begin 400 mg twice a day and on 10/31/11 ...  . ASPIRIN 81 MG PO TBEC Oral Take 1 tablet (81 mg total) by mouth daily.    Marland Kitchen BENAZEPRIL HCL 40 MG PO TABS Oral Take 40 mg by mouth daily.    Marland Kitchen CARVEDILOL 6.25 MG PO TABS Oral Take 1 tablet (6.25 mg  total) by mouth 2 (two) times daily with a meal. 60 tablet 11  . DOXAZOSIN MESYLATE 8 MG PO TABS Oral Take 8 mg by mouth at bedtime.    . FUROSEMIDE 40 MG PO TABS Oral Take 1 tablet (40 mg total) by mouth daily. 30 tablet 11  . GLYBURIDE-METFORMIN 5-500 MG PO TABS Oral Take 1 tablet by mouth 2 (two) times daily with a meal.    . POTASSIUM CHLORIDE CRYS ER 10 MEQ PO TBCR Oral Take 1 tablet (10 mEq total) by mouth daily. 30 tablet 11  . RIVAROXABAN 20 MG PO TABS Oral  Take 20 mg by mouth daily with supper. 30 tablet 11  . ROSUVASTATIN CALCIUM 20 MG PO TABS Oral Take 20 mg by mouth at bedtime.    Marland Kitchen SITAGLIPTIN PHOSPHATE 100 MG PO TABS Oral Take 100 mg by mouth daily.      BP 177/81  Pulse 92  Temp(Src) 98.3 F (36.8 C) (Oral)  Resp 31  SpO2 96%  Physical Exam  Constitutional: He is oriented to person, place, and time. He appears well-developed and well-nourished. He appears distressed.  HENT:  Head: Normocephalic and atraumatic.  Mouth/Throat: Oropharynx is clear and moist.  Eyes: Conjunctivae and EOM are normal. Pupils are equal, round, and reactive to light.  Neck: Normal range of motion. Neck supple.  Cardiovascular: Normal rate, regular rhythm and normal heart sounds.   No murmur heard. Pulmonary/Chest: He is in respiratory distress. He has no wheezes. He has rales. He exhibits no tenderness.  Abdominal: Soft. Bowel sounds are normal. There is no tenderness.  Musculoskeletal: Normal range of motion. He exhibits no edema.  Neurological: He is alert and oriented to person, place, and time. No cranial nerve deficit. He exhibits normal muscle tone. Coordination normal.  Skin: Skin is warm. No rash noted. He is not diaphoretic.    ED Course  Procedures (including critical care time)  Labs Reviewed  CBC - Abnormal; Notable for the following:    WBC 11.3 (*)    Hemoglobin 12.4 (*)    HCT 35.7 (*)    All other components within normal limits  DIFFERENTIAL - Abnormal; Notable for the following:    Neutrophils Relative 81 (*)    Neutro Abs 9.1 (*)    All other components within normal limits  POCT I-STAT, CHEM 8 - Abnormal; Notable for the following:    BUN 24 (*)    Creatinine, Ser 1.70 (*)    Glucose, Bld 208 (*)    All other components within normal limits  POCT I-STAT TROPONIN I  PRO B NATRIURETIC PEPTIDE  PROTIME-INR   Dg Chest 2 View  10/10/2011  *RADIOLOGY REPORT*  Clinical Data: Follow up CHF  CHEST - 2 VIEW  Comparison:  10/08/2011  Findings: Mild patchy bilateral lower lobe opacities, atelectasis versus pneumonia. Suspected small bilateral pleural effusions.  No frank interstitial edema.  No pneumothorax.  Stable cardiomegaly.  Degenerative changes of the visualized thoracolumbar spine.  IMPRESSION: Mild patchy bilateral lower lobe opacities, atelectasis versus pneumonia.  Stable cardiomegaly with small bilateral pleural effusions.  No frank interstitial edema.  Original Report Authenticated By: Charline Bills, M.D.    Date: 10/12/2011  Rate: 90  Rhythm: normal sinus rhythm  QRS Axis: right  Intervals: normal  ST/T Wave abnormalities: nonspecific ST/T changes  Conduction Disutrbances:first-degree A-V block  and left bundle branch block  Narrative Interpretation:   Old EKG Reviewed: unchanged No significant change in EKG compared to 10/11/2011 just yesterday.  No diagnosis found.  CRITICAL CARE Performed by: Shelda Jakes.   Total critical care time: 30  Critical care time was exclusive of separately billable procedures and treating other patients.  Critical care was necessary to treat or prevent imminent or life-threatening deterioration.  Critical care was time spent personally by me on the following activities: development of treatment plan with patient and/or surrogate as well as nursing, discussions with consultants, evaluation of patient's response to treatment, examination of patient, obtaining history from patient or surrogate, ordering and performing treatments and interventions, ordering and review of laboratory studies, ordering and review of radiographic studies, pulse oximetry and re-evaluation of patient's condition.   MDM  Patient just discharged from the hospital for congestive heart failure discharged on Friday at 3:00 in the afternoon was admitted on Tuesday followed by [facility] heart and vascular cardiology. At midnight acute shortness of breath portable chest x-ray  consistent with congestive heart failure. Room air oxygen was 79% on 5 L oxygen is up in the upper 90s. Not associated with chest pain no history of COPD. Will initiate IV Lasix 80 mg IV nitroglycerin drip.  In discussion with cardiology, they will readmit, there thinking that this may be toxicity do to the Amoridione  The patient improving with the nitroglycerin and Lasix.       Shelda Jakes, MD 10/12/11 [phone]

## 2011-10-13 ENCOUNTER — Encounter (HOSPITAL_COMMUNITY): Payer: Self-pay | Admitting: Cardiology

## 2011-10-13 DIAGNOSIS — J9600 Acute respiratory failure, unspecified whether with hypoxia or hypercapnia: Secondary | ICD-10-CM

## 2011-10-13 DIAGNOSIS — I480 Paroxysmal atrial fibrillation: Secondary | ICD-10-CM

## 2011-10-13 LAB — BASIC METABOLIC PANEL
BUN: 24 mg/dL — ABNORMAL HIGH (ref 6–23)
Creatinine, Ser: 1.71 mg/dL — ABNORMAL HIGH (ref 0.50–1.35)
GFR calc non Af Amer: 39 mL/min — ABNORMAL LOW (ref >90)
Glucose, Bld: 90 mg/dL (ref 70–99)
Potassium: 3.5 mEq/L (ref 3.5–5.1)

## 2011-10-13 LAB — CARDIAC PANEL(CRET KIN+CKTOT+MB+TROPI)
Relative Index: 2.1 (ref 0.0–2.5)
Troponin I: <0.3 ng/mL (ref <0.30)

## 2011-10-13 LAB — GLUCOSE, CAPILLARY: Glucose-Capillary: 95 mg/dL (ref 70–99)

## 2011-10-13 MED ORDER — FUROSEMIDE 80 MG PO TABS: 80.0000 mg | ORAL_TABLET | Freq: Two times a day (BID) | ORAL | Status: DC

## 2011-10-13 MED ORDER — POTASSIUM CHLORIDE CRYS ER 10 MEQ PO TBCR: 20.0000 meq | EXTENDED_RELEASE_TABLET | Freq: Every day | ORAL | Status: DC

## 2011-10-13 NOTE — Progress Notes (Signed)
[facility]  DAILY PROGRESS NOTE   Subjective:  No orthopnea really, but slept in recliner all night because of fear of orthopnea. Weight down 5 or 10 lbs depending on which admission weight was real. Doubt I and O are accurately recorded. Feels back to normal. No arrhythmia overnight. BNP actually up higher and more likely representative of recent HF events than admission BNP. Chol and LDL-C extremely low. This may be due to critical illness, but may need to "back off" atorvastatin. Recheck in 6 weeks.  Objective:  Temp:  [98.1 F (36.7 C)-98.6 F (37 C)] 98.1 F (36.7 C) (05/05 0436) Pulse Rate:  [75-79] 75  (05/05 0436) Resp:  [18-20] 18  (05/05 0436) BP: (111-128)/(69-76) 124/76 mmHg (05/05 0436) SpO2:  [93 %-99 %] 99 % (05/05 0436) Weight:  [126.2 kg (278 lb 3.5 oz)] 126.2 kg (278 lb 3.5 oz) (05/05 0436) Weight change: -2.1 kg (-4 lb 10.1 oz)  Intake/Output from previous day: 05/04 0701 - 05/05 0700 In: 900 [P.O.:900] Out: 1225 [Urine:1225]  Intake/Output from this shift:    Medications: Current Facility-Administered Medications  Medication Dose Route Frequency Provider Last Rate Last Dose  . 0.9 %  sodium chloride infusion  250 mL Intravenous PRN Chrystie Nose, MD      . acetaminophen (TYLENOL) tablet 650 mg  650 mg Oral Q6H PRN Chrystie Nose, MD       Or  . acetaminophen (TYLENOL) suppository 650 mg  650 mg Rectal Q6H PRN Chrystie Nose, MD      . alum & mag hydroxide-simeth (MAALOX/MYLANTA) 200-200-20 MG/5ML suspension 30 mL  30 mL Oral Q6H PRN Chrystie Nose, MD      . aspirin EC tablet 81 mg  81 mg Oral Daily Chrystie Nose, MD   81 mg at 10/12/11 1052  . atorvastatin (LIPITOR) tablet 40 mg  40 mg Oral q1800 Chrystie Nose, MD   40 mg at 10/12/11 1747  . benazepril (LOTENSIN) tablet 40 mg  40 mg Oral Daily Chrystie Nose, MD   40 mg at 10/12/11 1316  . carvedilol (COREG) tablet 6.25 mg  6.25 mg Oral BID WC Chrystie Nose, MD   6.25 mg at 10/13/11 0645  . doxazosin (CARDURA) tablet 8 mg  8 mg Oral QHS Chrystie Nose, MD   8 mg at 10/12/11 2240  . furosemide (LASIX) tablet 80 mg  80 mg Oral BID Ples Trudel, MD   80 mg at 10/12/11 1746  . glyBURIDE (DIABETA) tablet 5 mg  5 mg Oral BID WC Chrystie Nose, MD   5 mg at 10/13/11 0645  . linagliptin (TRADJENTA) tablet 5 mg  5 mg Oral Daily Chrystie Nose, MD   5 mg at 10/12/11 1052  . metFORMIN (GLUCOPHAGE) tablet 500 mg  500 mg Oral BID WC Chrystie Nose, MD   500 mg at 10/13/11 0646  . ondansetron (ZOFRAN) tablet 4 mg  4 mg Oral Q6H PRN Chrystie Nose, MD       Or  . ondansetron (ZOFRAN) injection 4 mg  4 mg Intravenous Q6H PRN Chrystie Nose, MD      . potassium chloride (K-DUR,KLOR-CON) CR tablet 10 mEq  10 mEq Oral Daily Chrystie Nose, MD   10 mEq at 10/12/11 1052  . rivaroxaban (XARELTO) tablet 20 mg  20 mg Oral Q supper Chrystie Nose, MD   20 mg at 10/12/11 1746  .  sodium chloride 0.9 % injection 3 mL  3 mL Intravenous Q12H Chrystie Nose, MD   3 mL at 10/12/11 2243  . sodium chloride 0.9 % injection 3 mL  3 mL Intravenous Q12H Chrystie Nose, MD   3 mL at 10/12/11 2243  . sodium chloride 0.9 % injection 3 mL  3 mL Intravenous PRN Chrystie Nose, MD      . DISCONTD: furosemide (LASIX) tablet 40 mg  40 mg Oral BID Chrystie Nose, MD   40 mg at 10/12/11 0849    Physical Exam: General appearance: alert, cooperative and no distress  Neck: no adenopathy, no carotid bruit, supple, symmetrical, trachea midline, thyroid not enlarged, symmetric, no tenderness/mass/nodules and jugular veins hard to evaluate due to obesity  Lungs: clear to auscultation bilaterally  Heart: regular rate and rhythm, S1, S2 normal, no murmur, click, rub or gallop  Abdomen: soft, non-tender; bowel sounds normal; no masses, no organomegaly  Extremities: extremities normal, atraumatic, no cyanosis. Trace to 1+ ankle edema. Pulses: 2+ and symmetric  Skin: Skin  color, texture, turgor normal. No rashes or lesions  Neurologic: Grossly normal   Lab Results: Results for orders placed during the hospital encounter of 10/12/11 (from the past 48 hour(s))  PRO B NATRIURETIC PEPTIDE     Status: Abnormal   Collection Time   10/12/11  1:14 AM      Component Value Range Comment   Pro B Natriuretic peptide (BNP) 336.9 (*) 0 - 125 (pg/mL)   CBC     Status: Abnormal   Collection Time   10/12/11  1:14 AM      Component Value Range Comment   WBC 11.3 (*) 4.0 - 10.5 (K/uL)    RBC 4.51  4.22 - 5.81 (MIL/uL)    Hemoglobin 12.4 (*) 13.0 - 17.0 (g/dL)    HCT 16.1 (*) 09.6 - 52.0 (%)    MCV 79.2  78.0 - 100.0 (fL)    MCH 27.5  26.0 - 34.0 (pg)    MCHC 34.7  30.0 - 36.0 (g/dL)    RDW 04.5  40.9 - 81.1 (%)    Platelets 205  150 - 400 (K/uL)   DIFFERENTIAL     Status: Abnormal   Collection Time   10/12/11  1:14 AM      Component Value Range Comment   Neutrophils Relative 81 (*) 43 - 77 (%)    Neutro Abs 9.1 (*) 1.7 - 7.7 (K/uL)    Lymphocytes Relative 14  12 - 46 (%)    Lymphs Abs 1.5  0.7 - 4.0 (K/uL)    Monocytes Relative 4  3 - 12 (%)    Monocytes Absolute 0.5  0.1 - 1.0 (K/uL)    Eosinophils Relative 1  0 - 5 (%)    Eosinophils Absolute 0.2  0.0 - 0.7 (K/uL)    Basophils Relative 0  0 - 1 (%)    Basophils Absolute 0.0  0.0 - 0.1 (K/uL)   PROTIME-INR     Status: Abnormal   Collection Time   10/12/11  1:14 AM      Component Value Range Comment   Prothrombin Time 23.2 (*) 11.6 - 15.2 (seconds)    INR 2.02 (*) 0.00 - 1.49    POCT I-STAT TROPONIN I     Status: Normal   Collection Time   10/12/11  1:26 AM      Component Value Range Comment   Troponin i, poc 0.02  0.00 -  0.08 (ng/mL)    Comment 3            POCT I-STAT, CHEM 8     Status: Abnormal   Collection Time   10/12/11  1:29 AM      Component Value Range Comment   Sodium 139  135 - 145 (mEq/L)    Potassium 3.8  3.5 - 5.1 (mEq/L)    Chloride 104  96 - 112 (mEq/L)    BUN 24 (*) 6 - 23 (mg/dL)     Creatinine, Ser 1.61 (*) 0.50 - 1.35 (mg/dL)    Glucose, Bld 096 (*) 70 - 99 (mg/dL)    Calcium, Ion 0.45  1.12 - 1.32 (mmol/L)    TCO2 25  0 - 100 (mmol/L)    Hemoglobin 13.3  13.0 - 17.0 (g/dL)    HCT 40.9  81.1 - 91.4 (%)   GLUCOSE, CAPILLARY     Status: Abnormal   Collection Time   10/12/11  6:20 AM      Component Value Range Comment   Glucose-Capillary 184 (*) 70 - 99 (mg/dL)    Comment 1 Notify RN      Comment 2 Documented in Chart     COMPREHENSIVE METABOLIC PANEL     Status: Abnormal   Collection Time   10/12/11  8:00 AM      Component Value Range Comment   Sodium 137  135 - 145 (mEq/L)    Potassium 4.1  3.5 - 5.1 (mEq/L)    Chloride 102  96 - 112 (mEq/L)    CO2 25  19 - 32 (mEq/L)    Glucose, Bld 170 (*) 70 - 99 (mg/dL)    BUN 23  6 - 23 (mg/dL)    Creatinine, Ser 7.82 (*) 0.50 - 1.35 (mg/dL)    Calcium 9.5  8.4 - 10.5 (mg/dL)    Total Protein 6.4  6.0 - 8.3 (g/dL)    Albumin 3.3 (*) 3.5 - 5.2 (g/dL)    AST 18  0 - 37 (U/L)    ALT 22  0 - 53 (U/L)    Alkaline Phosphatase 59  39 - 117 (U/L)    Total Bilirubin 0.9  0.3 - 1.2 (mg/dL)    GFR calc non Af Amer 41 (*) >90 (mL/min)    GFR calc Af Amer 47 (*) >90 (mL/min)   CBC     Status: Abnormal   Collection Time   10/12/11  8:00 AM      Component Value Range Comment   WBC 7.5  4.0 - 10.5 (K/uL)    RBC 4.07 (*) 4.22 - 5.81 (MIL/uL)    Hemoglobin 11.1 (*) 13.0 - 17.0 (g/dL)    HCT 95.6 (*) 21.3 - 52.0 (%)    MCV 79.9  78.0 - 100.0 (fL)    MCH 27.3  26.0 - 34.0 (pg)    MCHC 34.2  30.0 - 36.0 (g/dL)    RDW 08.6  57.8 - 46.9 (%)    Platelets 190  150 - 400 (K/uL)   DIFFERENTIAL     Status: Abnormal   Collection Time   10/12/11  8:00 AM      Component Value Range Comment   Neutrophils Relative 80 (*) 43 - 77 (%)    Neutro Abs 6.0  1.7 - 7.7 (K/uL)    Lymphocytes Relative 13  12 - 46 (%)    Lymphs Abs 1.0  0.7 - 4.0 (K/uL)    Monocytes Relative 6  3 -  12 (%)    Monocytes Absolute 0.5  0.1 - 1.0 (K/uL)    Eosinophils  Relative 1  0 - 5 (%)    Eosinophils Absolute 0.1  0.0 - 0.7 (K/uL)    Basophils Relative 0  0 - 1 (%)    Basophils Absolute 0.0  0.0 - 0.1 (K/uL)   GLUCOSE, CAPILLARY     Status: Abnormal   Collection Time   10/12/11 11:36 AM      Component Value Range Comment   Glucose-Capillary 172 (*) 70 - 99 (mg/dL)    Comment 1 Documented in Chart      Comment 2 Notify RN     GLUCOSE, CAPILLARY     Status: Abnormal   Collection Time   10/12/11  4:20 PM      Component Value Range Comment   Glucose-Capillary 123 (*) 70 - 99 (mg/dL)    Comment 1 Documented in Chart      Comment 2 Notify RN     GLUCOSE, CAPILLARY     Status: Abnormal   Collection Time   10/12/11  9:45 PM      Component Value Range Comment   Glucose-Capillary 127 (*) 70 - 99 (mg/dL)   PRO B NATRIURETIC PEPTIDE     Status: Abnormal   Collection Time   10/13/11  5:27 AM      Component Value Range Comment   Pro B Natriuretic peptide (BNP) 756.8 (*) 0 - 125 (pg/mL)   BASIC METABOLIC PANEL     Status: Abnormal   Collection Time   10/13/11  5:27 AM      Component Value Range Comment   Sodium 138  135 - 145 (mEq/L)    Potassium 3.5  3.5 - 5.1 (mEq/L)    Chloride 100  96 - 112 (mEq/L)    CO2 28  19 - 32 (mEq/L)    Glucose, Bld 90  70 - 99 (mg/dL)    BUN 24 (*) 6 - 23 (mg/dL)    Creatinine, Ser 1.61 (*) 0.50 - 1.35 (mg/dL)    Calcium 09.6  8.4 - 10.5 (mg/dL)    GFR calc non Af Amer 39 (*) >90 (mL/min)    GFR calc Af Amer 45 (*) >90 (mL/min)   CARDIAC PANEL(CRET KIN+CKTOT+MB+TROPI)     Status: Normal   Collection Time   10/13/11  5:27 AM      Component Value Range Comment   Total CK 186  7 - 232 (U/L)    CK, MB 3.9  0.3 - 4.0 (ng/mL)    Troponin I <0.30  <0.30 (ng/mL)    Relative Index 2.1  0.0 - 2.5    GLUCOSE, CAPILLARY     Status: Normal   Collection Time   10/13/11  6:09 AM      Component Value Range Comment   Glucose-Capillary 95  70 - 99 (mg/dL)     Imaging: Dg Chest Portable 1 View  10/12/2011  *RADIOLOGY REPORT*  Clinical  Data: Shortness of breath and tachycardia.  PORTABLE CHEST - 1 VIEW  Comparison: Chest radiograph performed 10/10/2011  Findings: The lungs are well-aerated.  Vascular congestion is noted, with bilateral central and bibasilar airspace opacities, raising concern for pulmonary edema.  There is no evidence of pleural effusion or pneumothorax.  The cardiomediastinal silhouette is mildly enlarged.  No acute osseous abnormalities are seen.  IMPRESSION: Vascular congestion and mild cardiomegaly, with bilateral central and bibasilar airspace opacities, raising concern for pulmonary edema.  Original  Report Authenticated By: Tonia Ghent, M.D.    Assessment:  1. Principal Problem: 2.  *Amiodarone pulmonary toxicity 3. Active Problems: 4.  DM (diabetes mellitus), type 2  5.  HTN (hypertension) 6.  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  OSA (obstructive sleep apnea), pt with apnea during procedures 8.   Plan:  1. Walk in hallway and lay flat in bed for 1 hour -"orthopnea stress test". If he does well with this, will DC home with follow-up within the week. 2. Careful sodium restriction. 3. Daily weights. 4. Keep on Furosemide 80 mg BID - call office if he strays more than 5 lb beneath today's weight or 2 lb over. 5. Also call if he has tachycardia/palpitations. 6. If he has early AFlutter recurrence, only antiarrhythmic option is dofetilide. Best long term option is RF ablation.  7. Xarelto for embolism prophylaxis 8. Keep scheduled appointments for nuclear perfusion study and sleep study. 9. Spironolactone considered but not started due to concerns re: renal insufficiency and expectation of reversible cardiomyopathy.   Time Spent Directly with Patient:  45 minutes spent on DC planning  Length of Stay:  LOS: 1 day    Ernest Lara 10/13/2011, 8:08 AM

## 2011-10-13 NOTE — Discharge Summary (Signed)
Physician Discharge Summary  Patient ID: Ernest Lara MRN: [id number] DOB/AGE: 1940/06/18 71 y.o.  Admit date: 10/12/2011 Discharge date: 10/13/2011  Discharge Diagnoses:  Principal Problem:  *Acute respiratory failure, with oxygen Sat of 72%, secondary to amiodarone toxicity. 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  DM (diabetes mellitus), type 2    Discharged Condition: good  Hospital Course: 71 year old African American widowed male retired M[employer] from [name] presented to the ER 10/12/11 after being awakened from sleep at 12 AM with shortness of breath.  He had been discharged earlier in the day 10/11/2011 after admission for paroxysmal atrial fibrillation with rapid ventricular response, acute systolic heart sires secondary to EF 25% and tachycardia, with TEE and cardioversion that admission.  He was being loaded on amiodarone orally at 400 mg 3 times a day.  On his arrival to the emergency room he was found to be profoundly hypoxic with room air oxygen saturation of 72%. Chest x-ray revealed mild cardiomegaly and vascular congestion with bilateral central and bibasilar air space the bases white count was slightly elevated at 11,000. His pro BNP was lower at 336 down from 514 just prior to discharge.  Dr. Rennis Golden saw him and admitted him and related this episode 2 acute anterior and induced noncardiogenic  pulmonary edema.  He was admitted for diuresing and further observation. His amiodarone was discontinued.  This was not a decompensated heart failure admission a drug reaction admission.  His chart has been labeled allergy to amiodarone.  From 3 AM until 9  AM he had considerable improvement in his dyspnea. His oxygen saturation normalized had no chest pain.  Most of his diuresis occurred in the emergency room difficult to assess.  Labs were negative for myocardial infarction.  Patient was kept another day for further observation to ensure he did not returned to atrial flutter/fib.  10/13/2011 patient can ambulate in the hall without complications. He was able to lie flat in bed without shortness of breath for over an hour.  The wall is proBNP was further elevated this was felt to be related to his general chronic heart failure.  He was seen by Dr. Royann Shivers and thought ready for discharge home.  He has Maintained sinus rhythm With first degree AV block and left bundle branch block with occasional PVCs since admission.  Weight on admission 10/12/2011  2 weights are actually documented  Within 3 minutes of each other: 130.6 kg and 128.3 kg.  Discharge weight 126.2 kg.  We'll check a basic metabolic panel and proBNP As an outpatient.   Consults: None  Significant Diagnostic Studies: Labs at discharge sodium 138 potassium 3.5 chloride 100 CO2 28 BUN 24 creatinine 1.71 calcium 10.2 glucose 90  Cardiac enzymes were negative troponin negative x2.  Pro BNP on admission 336  And at discharge 756  Hemoglobin 11.1 hematocrit 32.5 WBC 7.5  Chest x-ray on admission portable:  Vascular congestion and mild cardiomegaly, with bilateral central  and bibasilar airspace opacities, raising concern for pulmonary edema.   Discharge Exam: Blood pressure 124/76, pulse 75, temperature 98.1 F (36.7 C), temperature source Oral, resp. rate 18, height 6\' 1"  (1.854 m), weight 126.2 kg (278 lb 3.5 oz), SpO2 99.00%.  General appearance: alert, cooperative and no distress  Neck: no adenopathy, no carotid bruit, supple, symmetrical, trachea midline, thyroid not enlarged, symmetric, no tenderness/mass/nodules and jugular veins hard to evaluate due to obesity  Lungs: clear  to auscultation bilaterally  Heart: regular rate and rhythm, S1, S2 normal, no murmur, click, rub or gallop  Abdomen: soft, non-tender; bowel sounds normal; no masses, no organomegaly  Extremities: extremities normal, atraumatic, no cyanosis. Trace to 1+ ankle edema.  Pulses: 2+ and symmetric  Skin: Skin color, texture, turgor normal. No rashes or lesions  Neurologic: Grossly normal  Disposition: 01-Home or Self Care   Medication List  As of 10/13/2011  2:37 PM   STOP taking these medications         amiodarone 400 MG tablet         TAKE these medications         aspirin 81 MG EC tablet   Take 1 tablet (81 mg total) by mouth daily.      benazepril 40 MG tablet   Commonly known as: LOTENSIN   Take 40 mg by mouth daily.      carvedilol 6.25 MG tablet   Commonly known as: COREG   Take 1 tablet (6.25 mg total) by mouth 2 (two) times daily with a meal.      doxazosin 8 MG tablet   Commonly known as: CARDURA   Take 8 mg by mouth at bedtime.      furosemide 80 MG tablet   Commonly known as: LASIX   Take 1 tablet (80 mg total) by mouth 2 (two) times daily.      glyBURIDE-metformin 5-500 MG per tablet   Commonly known as: GLUCOVANCE   Take 1 tablet by mouth 2 (two) times daily with a meal.      potassium chloride 10 MEQ tablet   Commonly known as: K-DUR,KLOR-CON   Take 2 tablets (20 mEq total) by mouth daily.      Rivaroxaban 20 MG Tabs   Take 20 mg by mouth daily with supper.      rosuvastatin 20 MG tablet   Commonly known as: CRESTOR   Take 20 mg by mouth at bedtime.      sitaGLIPtin 100 MG tablet   Commonly known as: JANUVIA   Take 100 mg by mouth daily.           Follow-up Information    Follow up with Abelino Derrick, PA on 10/18/2011. (at 4:00 pm at the [facility] address)    Contact information:   [address] [place] [address] [phone]       Follow up with Thurmon Fair, MD on 11/01/2011. (at 1:15pm for stress test in Dr.  Renaye Rakers office.  do not eat or drink after 8:00 am that day.  No caffine for 24 hours prior to the study )    Contact information:   [address] [place] [address] [phone]       Follow up with Thurmon Fair, MD. (our office will call you for sleep study.)    Contact information:   [address] [place] [address] [phone]        Discharge instructions:  Have blood work done Wed 10/16/11 at Dr. Ophelia Charter office or the first floor of our office.  If your weight goes down more than 5 pounds in the next few days call our office or if your weight goes up more than 3 pounds  in a day call our office to adjust your diuretics.  Weigh daily  2gram sodium, diabetic diet  Call if your heart rate feels fast or irregular. SignedLeone Brand 10/13/2011, 2:37 PM

## 2011-10-14 LAB — GLUCOSE, CAPILLARY: Glucose-Capillary: 172 mg/dL — ABNORMAL HIGH (ref 70–99)

## 2011-10-15 ENCOUNTER — Encounter (HOSPITAL_COMMUNITY): Payer: Self-pay | Admitting: Cardiology

## 2011-10-17 NOTE — Discharge Summary (Signed)
Shamara Soza C. Bianka Liberati, MD, FACC Attending Cardiologist [facility]  

## 2011-11-01 HISTORY — PX: OTHER SURGICAL HISTORY: SHX169

## 2012-04-23 ENCOUNTER — Other Ambulatory Visit (HOSPITAL_COMMUNITY): Payer: Self-pay

## 2012-04-23 DIAGNOSIS — I5042 Chronic combined systolic (congestive) and diastolic (congestive) heart failure: Secondary | ICD-10-CM

## 2012-06-09 ENCOUNTER — Ambulatory Visit (HOSPITAL_COMMUNITY)
Admission: RE | Admit: 2012-06-09 | Discharge: 2012-06-09 | Disposition: A | Discharge status: Home / Self Care | Payer: Medicare Other | Source: Ambulatory Visit | Attending: Cardiovascular Disease | Admitting: Cardiovascular Disease

## 2012-06-09 ENCOUNTER — Ambulatory Visit (HOSPITAL_COMMUNITY): Payer: BC Managed Care – PPO

## 2012-06-09 DIAGNOSIS — R0609 Other forms of dyspnea: Secondary | ICD-10-CM | POA: Insufficient documentation

## 2012-06-09 DIAGNOSIS — E119 Type 2 diabetes mellitus without complications: Secondary | ICD-10-CM | POA: Insufficient documentation

## 2012-06-09 DIAGNOSIS — I10 Essential (primary) hypertension: Secondary | ICD-10-CM | POA: Insufficient documentation

## 2012-06-09 DIAGNOSIS — I517 Cardiomegaly: Secondary | ICD-10-CM | POA: Insufficient documentation

## 2012-06-09 DIAGNOSIS — I4891 Unspecified atrial fibrillation: Secondary | ICD-10-CM | POA: Insufficient documentation

## 2012-06-09 DIAGNOSIS — I5042 Chronic combined systolic (congestive) and diastolic (congestive) heart failure: Secondary | ICD-10-CM

## 2012-06-09 DIAGNOSIS — R0989 Other specified symptoms and signs involving the circulatory and respiratory systems: Secondary | ICD-10-CM | POA: Insufficient documentation

## 2012-06-09 NOTE — Progress Notes (Signed)
2D Echo Performed 06/09/2012    Drezden Seitzinger, RCS  

## 2012-09-22 ENCOUNTER — Encounter: Payer: Self-pay | Admitting: *Deleted

## 2012-09-28 ENCOUNTER — Encounter: Payer: Self-pay | Admitting: Cardiovascular Disease

## 2012-10-27 ENCOUNTER — Other Ambulatory Visit: Payer: Self-pay | Admitting: *Deleted

## 2012-10-27 MED ORDER — POTASSIUM CHLORIDE CRYS ER 10 MEQ PO TBCR: 20.0000 meq | EXTENDED_RELEASE_TABLET | Freq: Every day | ORAL | Status: DC

## 2012-10-27 NOTE — Telephone Encounter (Signed)
rx sent to pharmacy by e-script  

## 2012-11-03 ENCOUNTER — Other Ambulatory Visit: Payer: Self-pay | Admitting: *Deleted

## 2012-11-03 MED ORDER — FUROSEMIDE 80 MG PO TABS: 80.0000 mg | ORAL_TABLET | Freq: Two times a day (BID) | ORAL | Status: DC

## 2012-11-26 ENCOUNTER — Telehealth: Payer: Self-pay | Admitting: *Deleted

## 2012-11-26 NOTE — Telephone Encounter (Signed)
Results of Event Monitor called to patient.  Shows episodes of rapid heart beat.  Called to verify patient meds.  Will make an appt w/Dr. C. To discuss whether he needs to see an EP doctor.

## 2012-12-24 ENCOUNTER — Encounter: Payer: Self-pay | Admitting: Cardiovascular Disease

## 2012-12-24 ENCOUNTER — Ambulatory Visit (INDEPENDENT_AMBULATORY_CARE_PROVIDER_SITE_OTHER): Payer: Medicare Other | Admitting: Cardiovascular Disease

## 2012-12-24 VITALS — BP 126/62 | Ht 75.0 in | Wt 255.8 lb

## 2012-12-24 DIAGNOSIS — I420 Dilated cardiomyopathy: Secondary | ICD-10-CM | POA: Insufficient documentation

## 2012-12-24 DIAGNOSIS — E785 Hyperlipidemia, unspecified: Secondary | ICD-10-CM

## 2012-12-24 DIAGNOSIS — I509 Heart failure, unspecified: Secondary | ICD-10-CM

## 2012-12-24 DIAGNOSIS — I4891 Unspecified atrial fibrillation: Secondary | ICD-10-CM

## 2012-12-24 DIAGNOSIS — E119 Type 2 diabetes mellitus without complications: Secondary | ICD-10-CM

## 2012-12-24 DIAGNOSIS — I447 Left bundle-branch block, unspecified: Secondary | ICD-10-CM | POA: Insufficient documentation

## 2012-12-24 DIAGNOSIS — I428 Other cardiomyopathies: Secondary | ICD-10-CM

## 2012-12-24 DIAGNOSIS — I5042 Chronic combined systolic (congestive) and diastolic (congestive) heart failure: Secondary | ICD-10-CM

## 2012-12-24 DIAGNOSIS — I10 Essential (primary) hypertension: Secondary | ICD-10-CM

## 2012-12-24 MED ORDER — CARVEDILOL 25 MG PO TABS: ORAL_TABLET | ORAL | Status: DC

## 2012-12-24 NOTE — Assessment & Plan Note (Signed)
Within excellent range today

## 2012-12-24 NOTE — Progress Notes (Signed)
Patient ID: Ernest Lara, male   DOB: 1941-02-06, 72 y.o.   MRN: [id number]      Reason for office visit Dilated cardiomyopathy, paroxysmal atrial flutter/atrial fibrillation, congestive heart failure, hypertension  It has been roughly a year since Ernest Lara initial presentation with congestive heart failure and atrial tachycardia arrhythmia. He had an EF of 20-25% at that time but this has improved substantially following correction of the arrhythmia and increased doses of beta blockers and ACE inhibitors. He is not a maximum dose of carvedilol but the last several months has been complaining of a lot of fatigue especially after he takes his morning medications. Despite this he is able to walk for 2-2 and half miles in about a 50 minute period on a daily basis. Has lost a little weight. He states that his glucose control has improved substantially. His appetite is less than before and this has helped him with weight loss. He denies any shortness of breath at rest or with exertion and has not had any syncope or dizziness.    Allergies  Allergen Reactions  . Amiodarone Shortness Of Breath    Pulmonary toxicity with amiodarone    Current Outpatient Prescriptions  Medication Sig Dispense Refill  . benazepril (LOTENSIN) 40 MG tablet Take 40 mg by mouth daily.      . carvedilol (COREG) 25 MG tablet Take a half tablet (12.5 mg) every morning and a whole tablet (25 mg) every evening  135 tablet  3  . doxazosin (CARDURA) 8 MG tablet Take 8 mg by mouth at bedtime.      . furosemide (LASIX) 80 MG tablet Take 1 tablet (80 mg total) by mouth 2 (two) times daily.  60 tablet  5  . glyBURIDE-metformin (GLUCOVANCE) 5-500 MG per tablet Take 1 tablet by mouth 2 (two) times daily with a meal.      . potassium chloride (K-DUR,KLOR-CON) 10 MEQ tablet Take 2 tablets (20 mEq total) by mouth daily.  60 tablet  11  . rivaroxaban 20 MG TABS Take 20 mg by mouth daily with supper.  30 tablet  11  . rosuvastatin  (CRESTOR) 20 MG tablet Take 20 mg by mouth at bedtime.      . sitaGLIPtin (JANUVIA) 100 MG tablet Take 100 mg by mouth daily.      Marland Kitchen DIGOX 250 MCG tablet Take 0.25 mg by mouth daily.      . furosemide (LASIX) 80 MG tablet Take 1 tablet (80 mg total) by mouth 2 (two) times daily.  60 tablet  11   No current facility-administered medications for this visit.    Past Medical History  Diagnosis Date  . Hypertension   . Diabetes mellitus   . DM (diabetes mellitus), type 2  10/08/2011  . HTN (hypertension) 10/08/2011  . 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 afib 10/08/2011  . CHF (congestive heart failure)     mild   . OSA (obstructive sleep apnea), pt with apnea during procedures 10/11/2011  . CKD (chronic kidney disease) stage 3, GFR 30-59 ml/min 10/13/2011  . Systolic dysfunction     Past Surgical History  Procedure Laterality Date  . No past surgeries    . Tee without cardioversion  10/10/2011    Procedure: TRANSESOPHAGEAL ECHOCARDIOGRAM (TEE);  Surgeon: Thurmon Fair, MD;  Location: [facility] ENDOSCOPY;  Service: Cardiovascular;  Laterality: N/A;  . Cardioversion  10/11/2011    Procedure: CARDIOVERSION;  Surgeon: Marykay Lex, MD;  Location: [facility] OR;  Service: Cardiovascular;  Laterality: N/A;  . US echocardiography  06/09/2012    EF 40-45%,mild concentric hypertrophy  . Nuclear stress  11/01/2011    severe global hypokinesis,dilated ventricle    No family history on file.  History   Social History  . Marital Status: Married    Spouse Name: N/A    Number of Children: N/A  . Years of Education: N/A   Occupational History  . Not on file.   Social History Main Topics  . Smoking status: Never Smoker   . Smokeless tobacco: Never Used  . Alcohol Use: 0.6 oz/week    1 Shots of liquor per week     Comment: one drink per week"  . Drug Use: No  . Sexually Active: Not Currently   Other Topics Concern  . Not on file   Social History Narrative  . No narrative  on file    Review of systems: The patient specifically denies any chest pain at rest or with exertion, dyspnea at rest or with exertion, orthopnea, paroxysmal nocturnal dyspnea, syncope, palpitations, focal neurological deficits, intermittent claudication, lower extremity edema, unexplained weight gain, cough, hemoptysis or wheezing.  The patient also denies abdominal pain, nausea, vomiting, dysphagia, diarrhea, constipation, polyuria, polydipsia, dysuria, hematuria, frequency, urgency, abnormal bleeding or bruising, fever, chills, unexpected weight changes, mood swings, change in skin or hair texture, change in voice quality, auditory or visual problems, allergic reactions or rashes, new musculoskeletal complaints other than usual "aches and pains".   PHYSICAL EXAM BP 126/62  Ht 6\' 3"  (1.905 m)  Wt 255 lb 12.8 oz (116.03 kg)  BMI 31.97 kg/m2  General: Alert, oriented x3, no distress Head: no evidence of trauma, PERRL, EOMI, no exophtalmos or lid lag, no myxedema, no xanthelasma; normal ears, nose and oropharynx Neck: normal jugular venous pulsations and no hepatojugular reflux; brisk carotid pulses without delay and no carotid bruits Chest: clear to auscultation, no signs of consolidation by percussion or palpation, normal fremitus, symmetrical and full respiratory excursions Cardiovascular: normal position and quality of the apical impulse, regular rhythm, normal first and paradoxically split second heart sounds, no murmurs, rubs or gallops Abdomen: no tenderness or distention, no masses by palpation, no abnormal pulsatility or arterial bruits, normal bowel sounds, no hepatosplenomegaly Extremities: no clubbing, cyanosis or edema; 2+ radial, ulnar and brachial pulses bilaterally; 2+ right femoral, posterior tibial and dorsalis pedis pulses; 2+ left femoral, posterior tibial and dorsalis pedis pulses; no subclavian or femoral bruits Neurological: grossly nonfocal   EKG: Times rhythm with  first degree atrial ventricular block and left bundle branch  Lipid Panel     Component Value Date/Time   CHOL 87 10/09/2011 0523   TRIG 82 10/09/2011 0523   HDL 56 10/09/2011 0523   CHOLHDL 1.6 10/09/2011 0523   VLDL 16 10/09/2011 0523   LDLCALC 15 10/09/2011 0523    BMET    Component Value Date/Time   NA 138 10/13/2011 0527   K 3.5 10/13/2011 0527   CL 100 10/13/2011 0527   CO2 28 10/13/2011 0527   GLUCOSE 90 10/13/2011 0527   BUN 24* 10/13/2011 0527   CREATININE 1.71* 10/13/2011 0527   CALCIUM 10.2 10/13/2011 0527   GFRNONAA 39* 10/13/2011 0527   GFRAA 45* 10/13/2011 0527     ASSESSMENT AND PLAN Congestive dilated cardiomyopathy Has been roughly one year since his presentation with acute congestive heart failure and severely depressed left ventricular systolic function (EF 20-25%). 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 The most recent assessment of his left ventricle systolic function showed an EF of 40-45% last December, following correction of the arrhythmia and treatment with high doses of carvedilol and ACE inhibitors. Although he has never had coronary angiography, in May of last year he had a normal myocardial perfusion pattern on nuclear scintigraphy . He now has NYHA functional class I. He is euvolemic and has not required adjustment of dose of diuretic therapy. He remains on ACE inhibitors and beta blockers. It is possible the left ventricular systolic function has improved further.  Atrial fibrillation with RVR, new onset, 10/08/11, DCCV to SR on 10/11/11 No clinical recurrence, note history of possible side effects of amiodarone (uncertain causality).   HTN (hypertension) Within excellent range today  DM (diabetes mellitus), type 2  He reports good glycemic control  Hyperlipidemia Excellent parameters on Crestor  LBBB (left bundle branch block) He also has first degree AV  block and complaints of fatigue. I think it is wisest to reduce the dose of carvedilol slightly to avoid precipitating the need for pacemaker implantation. If he does eventually require a pacemaker, consideration should be given to implantation of a CRT device.  Orders Placed This Encounter  Procedures  . EKG 12-Lead   Meds ordered this encounter  Medications  . DISCONTD: carvedilol (COREG) 6.25 MG tablet    Sig: Take 25 mg by mouth 2 (two) times daily with a meal.  . DISCONTD: carvedilol (COREG) 25 MG tablet    Sig: Take 25 mg by mouth 2 (two) times daily with a meal.  . DIGOX 250 MCG tablet    Sig: Take 0.25 mg by mouth daily.  . carvedilol (COREG) 25 MG tablet    Sig: Take a half tablet (12.5 mg) every morning and a whole tablet (25 mg) every evening    Dispense:  135 tablet    Refill:  3    Layne Lebon  Thurmon Fair, MD, [facility] [phone] office [phone] pager

## 2012-12-24 NOTE — Patient Instructions (Signed)
Your physician has recommended you make the following change in your medication: Takes carvedilol 12.5 mg (half a tablet) in the morning and 25 mg (a whole tablet) in the evening Your physician recommends that you schedule a follow-up appointment in: 6 months

## 2012-12-24 NOTE — Assessment & Plan Note (Signed)
He also has first degree AV block and complaints of fatigue. I think it is wisest to reduce the dose of carvedilol slightly to avoid precipitating the need for pacemaker implantation. If he does eventually require a pacemaker, consideration should be given to implantation of a CRT device.

## 2012-12-24 NOTE — Assessment & Plan Note (Signed)
No clinical recurrence, note history of possible side effects of amiodarone (uncertain causality).

## 2012-12-24 NOTE — Assessment & Plan Note (Signed)
He reports good glycemic control

## 2012-12-24 NOTE — Assessment & Plan Note (Addendum)
Has been roughly one year since his presentation with acute congestive heart failure and severely depressed left ventricular systolic function (EF 20-25%). 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 The most recent assessment of his left ventricle systolic function showed an EF of 40-45% last December, following correction of the arrhythmia and treatment with high doses of carvedilol and ACE inhibitors. Although he has never had coronary angiography, in May of last year he had a normal myocardial perfusion pattern on nuclear scintigraphy . He now has NYHA functional class I. He is euvolemic and has not required adjustment of dose of diuretic therapy. He remains on ACE inhibitors and beta blockers. It is possible the left ventricular systolic function has improved further.

## 2012-12-24 NOTE — Assessment & Plan Note (Signed)
Excellent parameters on Crestor

## 2013-04-24 ENCOUNTER — Other Ambulatory Visit: Payer: Self-pay | Admitting: Cardiology

## 2013-04-26 ENCOUNTER — Other Ambulatory Visit: Payer: Self-pay | Admitting: *Deleted

## 2013-04-26 MED ORDER — DOXAZOSIN MESYLATE 8 MG PO TABS: 8.0000 mg | ORAL_TABLET | Freq: Every day | ORAL | Status: DC

## 2013-04-26 NOTE — Telephone Encounter (Signed)
Rx was sent to pharmacy electronically. 

## 2013-05-05 ENCOUNTER — Other Ambulatory Visit: Payer: Self-pay | Admitting: Cardiovascular Disease

## 2013-05-05 NOTE — Telephone Encounter (Signed)
Rx was sent to pharmacy electronically. 

## 2013-06-07 ENCOUNTER — Telehealth: Payer: Self-pay | Admitting: Cardiovascular Disease

## 2013-06-07 NOTE — Telephone Encounter (Signed)
Forwarded to B. Lassiter, CMA.  

## 2013-06-07 NOTE — Telephone Encounter (Signed)
PA # [id number] - [facility] [address]

## 2013-06-07 NOTE — Telephone Encounter (Signed)
Received a letter from [facility] for a prior authorization for Dr.Croitoru  To complete on his digoxtab 0.25mg  before he can get it filled.please call  Thanks

## 2013-06-10 HISTORY — PX: EYE SURGERY: SHX253

## 2013-06-24 ENCOUNTER — Encounter: Payer: Self-pay | Admitting: Cardiovascular Disease

## 2013-06-24 ENCOUNTER — Ambulatory Visit (INDEPENDENT_AMBULATORY_CARE_PROVIDER_SITE_OTHER): Payer: Medicare Other | Admitting: Cardiovascular Disease

## 2013-06-24 VITALS — BP 120/60 | HR 74 | Ht 75.0 in | Wt 246.4 lb

## 2013-06-24 DIAGNOSIS — I4891 Unspecified atrial fibrillation: Secondary | ICD-10-CM

## 2013-06-24 DIAGNOSIS — E119 Type 2 diabetes mellitus without complications: Secondary | ICD-10-CM

## 2013-06-24 DIAGNOSIS — E785 Hyperlipidemia, unspecified: Secondary | ICD-10-CM

## 2013-06-24 DIAGNOSIS — I509 Heart failure, unspecified: Secondary | ICD-10-CM

## 2013-06-24 DIAGNOSIS — I5042 Chronic combined systolic (congestive) and diastolic (congestive) heart failure: Secondary | ICD-10-CM

## 2013-06-24 DIAGNOSIS — I447 Left bundle-branch block, unspecified: Secondary | ICD-10-CM

## 2013-06-24 DIAGNOSIS — I10 Essential (primary) hypertension: Secondary | ICD-10-CM

## 2013-06-24 DIAGNOSIS — I480 Paroxysmal atrial fibrillation: Secondary | ICD-10-CM

## 2013-06-24 MED ORDER — FUROSEMIDE 80 MG PO TABS: 80.0000 mg | ORAL_TABLET | Freq: Every day | ORAL | Status: DC

## 2013-06-24 NOTE — Assessment & Plan Note (Signed)
As he continues to lose weight he may require lower doses of diabetic medication

## 2013-06-24 NOTE — Assessment & Plan Note (Signed)
He has clear evidence of conduction system disease and will likely eventually need a pacemaker. Whether this is an age-related phenomenon or is part of his cardiomyopathy is less clear. Either way if he does require implantation of a device, he should be reevaluated at that time for CRT-D indication since he has a very broad left bundle branch block.

## 2013-06-24 NOTE — Assessment & Plan Note (Signed)
Good lipid parameters on crestor

## 2013-06-24 NOTE — Assessment & Plan Note (Signed)
Excellent control. Continue ACE inhibitors and carvedilol for heart failure.

## 2013-06-24 NOTE — Assessment & Plan Note (Addendum)
He has had both atrial fibrillation and atrial flutter with 2:1 AV block. He has not had new clinical episodes of atrial tachyarrhythmia, but he was never really aware of palpitations. 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 On the other hand I think we need to back off the intensity of AV node blocking agents since he now has a PR interval of 330 ms and a left bundle branch block. He will stop taking digoxin. He will remain on the same dose of carvedilol. Tolerating Xarelto without bleeding so far. So far his arrhythmia has not been symptomatic and we are avoiding antiarrhythmic medication since this would precipitate the need for pacemaker implantation. Similarly, since the arrhythmia appears to be neither prevalent, nor very symptomatic at this time, ablation does not appear to be indicated.

## 2013-06-24 NOTE — Assessment & Plan Note (Signed)
He continues to be well compensated and appears clinically euvolemic after we reduce his diuretic dose. I did encourage him to get a new scale so that we can use this as a warning for new exacerbation.

## 2013-06-24 NOTE — Progress Notes (Signed)
Patient ID: Ernest Lara, male   DOB: 1940-10-24, 73 y.o.   MRN: [id number]      Reason for office visit Followup CHF, paroxysmal atrial fibrillation, hypertension, hyperlipidemia  Mr. Theisen is actually feeling quite well. He is walking on irregular basis. He is more cautious with overall food intake and specifically avoids sugars starches and salt. He has lost over 20 pounds since May. He has not had palpitations and denies any problems with shortness of breath. He does not complaining of syncope, dizziness, fatigue. He has not had lower showed edema. He is now taking his diuretic only once a day. He has not been aware of any palpitations, but he was never very aware of his arrhythmia when it occurred in the past.  He presented with atrial fibrillation rapid ventricular response and congestive heart failure. Left ventricular ejection fraction was severely reduced but improved back to 40-45% after arrhythmia control. He has not had a stroke or embolic events and is tolerating a novel anticoagulant without any bleeding problem   Allergies  Allergen Reactions  . Amiodarone Shortness Of Breath    Pulmonary toxicity with amiodarone    Current Outpatient Prescriptions  Medication Sig Dispense Refill  . benazepril (LOTENSIN) 40 MG tablet Take 40 mg by mouth daily.      . carvedilol (COREG) 25 MG tablet Take a half tablet (12.5 mg) every morning and a whole tablet (25 mg) every evening  135 tablet  3  . doxazosin (CARDURA) 8 MG tablet Take 1 tablet (8 mg total) by mouth at bedtime.  30 tablet  8  . furosemide (LASIX) 80 MG tablet Take 1 tablet (80 mg total) by mouth daily.  60 tablet  5  . glyBURIDE-metformin (GLUCOVANCE) 5-500 MG per tablet Take 1 tablet by mouth 2 (two) times daily with a meal.      . potassium chloride (K-DUR,KLOR-CON) 10 MEQ tablet Take 2 tablets (20 mEq total) by mouth daily.  60 tablet  11  . Rivaroxaban (XARELTO) 20 MG TABS tablet Take 1 tablet (20 mg total) by mouth  daily with supper.  30 tablet  5  . rosuvastatin (CRESTOR) 20 MG tablet Take 20 mg by mouth at bedtime.      . sitaGLIPtin (JANUVIA) 100 MG tablet Take 100 mg by mouth daily.      . furosemide (LASIX) 80 MG tablet Take 1 tablet (80 mg total) by mouth 2 (two) times daily.  60 tablet  11   No current facility-administered medications for this visit.    Past Medical History  Diagnosis Date  . Hypertension   . Diabetes mellitus   . DM (diabetes mellitus), type 2  10/08/2011  . HTN (hypertension) 10/08/2011  . 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 afib 10/08/2011  . CHF (congestive heart failure)     mild   . OSA (obstructive sleep apnea), pt with apnea during procedures 10/11/2011  . CKD (chronic kidney disease) stage 3, GFR 30-59 ml/min 10/13/2011  . Systolic dysfunction     Past Surgical History  Procedure Laterality Date  . No past surgeries    . Tee without cardioversion  10/10/2011    Procedure: TRANSESOPHAGEAL ECHOCARDIOGRAM (TEE);  Surgeon: Thurmon Fair, MD;  Location: [facility] ENDOSCOPY;  Service: Cardiovascular;  Laterality: N/A;  . Cardioversion  10/11/2011    Procedure: CARDIOVERSION;  Surgeon: Marykay Lex, MD;  Location: [facility] OR;  Service: Cardiovascular;  Laterality: N/A;  . US echocardiography  06/09/2012  EF 40-45%,mild concentric hypertrophy  . Nuclear stress  11/01/2011    severe global hypokinesis,dilated ventricle    No family history on file.  History   Social History  . Marital Status: Married    Spouse Name: N/A    Number of Children: N/A  . Years of Education: N/A   Occupational History  . Not on file.   Social History Main Topics  . Smoking status: Never Smoker   . Smokeless tobacco: Never Used  . Alcohol Use: 0.6 oz/week    1 Shots of liquor per week     Comment: one drink per week"  . Drug Use: No  . Sexual Activity: Not Currently   Other Topics Concern  . Not on file   Social History Narrative  . No narrative on file      Review of systems: The patient specifically denies any chest pain at rest or with exertion, dyspnea at rest or with exertion, orthopnea, paroxysmal nocturnal dyspnea, syncope, palpitations, focal neurological deficits, intermittent claudication, lower extremity edema, unexplained weight gain, cough, hemoptysis or wheezing.  The patient also denies abdominal pain, nausea, vomiting, dysphagia, diarrhea, constipation, polyuria, polydipsia, dysuria, hematuria, frequency, urgency, abnormal bleeding or bruising, fever, chills, unexpected weight changes, mood swings, change in skin or hair texture, change in voice quality, auditory or visual problems, allergic reactions or rashes, new musculoskeletal complaints other than usual "aches and pains".   PHYSICAL EXAM BP 120/60  Pulse 74  Ht 6\' 3"  (1.905 m)  Wt 111.766 kg (246 lb 6.4 oz)  BMI 30.80 kg/m2  General: Alert, oriented x3, no distress Head: no evidence of trauma, PERRL, EOMI, no exophtalmos or lid lag, no myxedema, no xanthelasma; normal ears, nose and oropharynx Neck: normal jugular venous pulsations and no hepatojugular reflux; brisk carotid pulses without delay and no carotid bruits Chest: clear to auscultation, no signs of consolidation by percussion or palpation, normal fremitus, symmetrical and full respiratory excursions Cardiovascular: normal position and quality of the apical impulse, regular rhythm, normal first and second heart sounds, no murmurs, rubs or gallops Abdomen: no tenderness or distention, no masses by palpation, no abnormal pulsatility or arterial bruits, normal bowel sounds, no hepatosplenomegaly Extremities: no clubbing, cyanosis or edema; 2+ radial, ulnar and brachial pulses bilaterally; 2+ right femoral, posterior tibial and dorsalis pedis pulses; 2+ left femoral, posterior tibial and dorsalis pedis pulses; no subclavian or femoral bruits Neurological: grossly nonfocal   EKG: Sinus rhythm with a very long first  degree AV block (330 ms) and a very broad left bundle branch block (QRS 152 ms)  Lipid Panel     Component Value Date/Time   CHOL 87 10/09/2011 0523   TRIG 82 10/09/2011 0523   HDL 56 10/09/2011 0523   CHOLHDL 1.6 10/09/2011 0523   VLDL 16 10/09/2011 0523   LDLCALC 15 10/09/2011 0523    BMET    Component Value Date/Time   NA 138 10/13/2011 0527   K 3.5 10/13/2011 0527   CL 100 10/13/2011 0527   CO2 28 10/13/2011 0527   GLUCOSE 90 10/13/2011 0527   BUN 24* 10/13/2011 0527   CREATININE 1.71* 10/13/2011 0527   CALCIUM 10.2 10/13/2011 0527   GFRNONAA 39* 10/13/2011 0527   GFRAA 45* 10/13/2011 0527     ASSESSMENT AND PLAN Chronic combined systolic and diastolic CHF, NYHA class 1 He continues to be well compensated and appears clinically euvolemic after we reduce his diuretic dose. I did encourage him to get a new scale so  that we can use this as a warning for new exacerbation.  PAF (paroxysmal atrial fibrillation), recent DCCV 10/11/11 to [facility]. He has had both atrial fibrillation and atrial flutter with 2:1 AV block. He has not had new clinical episodes of atrial tachyarrhythmia, but he was never really aware of palpitations. 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 On the other hand I think we need to back off the intensity of AV node blocking agents since he now has a PR interval of 330 ms and a left bundle branch block. He will stop taking digoxin. He will remain on the same dose of carvedilol. Tolerating Xarelto without bleeding so far. So far his arrhythmia has not been symptomatic and we are avoiding antiarrhythmic medication since this would precipitate the need for pacemaker implantation. Similarly, since the arrhythmia appears to be neither prevalent, nor very symptomatic at this time, ablation does not appear to be indicated.  LBBB (left bundle branch block) He has clear evidence of conduction system disease and will  likely eventually need a pacemaker. Whether this is an age-related phenomenon or is part of his cardiomyopathy is less clear. Either way if he does require implantation of a device, he should be reevaluated at that time for CRT-D indication since he has a very broad left bundle branch block.  HTN (hypertension) Excellent control. Continue ACE inhibitors and carvedilol for heart failure.  DM (diabetes mellitus), type 2  As he continues to lose weight he may require lower doses of diabetic medication   Orders Placed This Encounter  Procedures  . EKG 12-Lead   Meds ordered this encounter  Medications  . furosemide (LASIX) 80 MG tablet    Sig: Take 1 tablet (80 mg total) by mouth daily.    Dispense:  60 tablet    Refill:  [place] Kimimila Tauzin, MD, [facility] [phone] office [phone] pager

## 2013-06-24 NOTE — Patient Instructions (Addendum)
Stop digoxin. Followup in 12 months. Please call us immediately if you develop problems with blackout spells, severe dizziness, severe fatigue or worsening shortness of breath.   STOP TAKING [place]

## 2013-08-12 ENCOUNTER — Telehealth: Payer: Self-pay | Admitting: *Deleted

## 2013-08-12 NOTE — Telephone Encounter (Signed)
[employer] approved Xarelto through 08/12/2014.  Patient notified.

## 2013-09-11 ENCOUNTER — Emergency Department (HOSPITAL_COMMUNITY)
Admission: EM | Admit: 2013-09-11 | Discharge: 2013-09-11 | Disposition: A | Discharge status: Home / Self Care | Payer: Medicare Other | Source: Home / Self Care | Attending: Family Medicine | Admitting: Family Medicine

## 2013-09-11 ENCOUNTER — Encounter (HOSPITAL_COMMUNITY): Payer: Self-pay | Admitting: Emergency Medicine

## 2013-09-11 ENCOUNTER — Emergency Department (INDEPENDENT_AMBULATORY_CARE_PROVIDER_SITE_OTHER): Payer: Medicare Other

## 2013-09-11 DIAGNOSIS — J40 Bronchitis, not specified as acute or chronic: Secondary | ICD-10-CM

## 2013-09-11 LAB — PRO B NATRIURETIC PEPTIDE: Pro B Natriuretic peptide (BNP): 341.7 pg/mL — ABNORMAL HIGH (ref 0–125)

## 2013-09-11 LAB — POCT I-STAT, CHEM 8
BUN: 16 mg/dL (ref 6–23)
Calcium, Ion: 1.33 mmol/L — ABNORMAL HIGH (ref 1.13–1.30)
Chloride: 102 mEq/L (ref 96–112)
Creatinine, Ser: 1.4 mg/dL — ABNORMAL HIGH (ref 0.50–1.35)
Glucose, Bld: 185 mg/dL — ABNORMAL HIGH (ref 70–99)
HCT: 36 % — ABNORMAL LOW (ref 39.0–52.0)
HEMOGLOBIN: 12.2 g/dL — AB (ref 13.0–17.0)
POTASSIUM: 4.1 meq/L (ref 3.7–5.3)
SODIUM: 139 meq/L (ref 137–147)
TCO2: 26 mmol/L (ref 0–100)

## 2013-09-11 LAB — CBC WITH DIFFERENTIAL/PLATELET
BASOS ABS: 0 10*3/uL (ref 0.0–0.1)
Basophils Relative: 1 % (ref 0–1)
EOS PCT: 2 % (ref 0–5)
Eosinophils Absolute: 0.2 10*3/uL (ref 0.0–0.7)
HCT: 33.7 % — ABNORMAL LOW (ref 39.0–52.0)
Hemoglobin: 11.6 g/dL — ABNORMAL LOW (ref 13.0–17.0)
LYMPHS PCT: 25 % (ref 12–46)
Lymphs Abs: 1.6 10*3/uL (ref 0.7–4.0)
MCH: 26.5 pg (ref 26.0–34.0)
MCHC: 34.4 g/dL (ref 30.0–36.0)
MCV: 76.9 fL — AB (ref 78.0–100.0)
Monocytes Absolute: 0.4 10*3/uL (ref 0.1–1.0)
Monocytes Relative: 7 % (ref 3–12)
NEUTROS PCT: 65 % (ref 43–77)
Neutro Abs: 4.1 10*3/uL (ref 1.7–7.7)
PLATELETS: 235 10*3/uL (ref 150–400)
RBC: 4.38 MIL/uL (ref 4.22–5.81)
RDW: 14.2 % (ref 11.5–15.5)
WBC: 6.2 10*3/uL (ref 4.0–10.5)

## 2013-09-11 LAB — POCT RAPID STREP A: STREPTOCOCCUS, GROUP A SCREEN (DIRECT): NEGATIVE

## 2013-09-11 MED ORDER — ALBUTEROL SULFATE HFA 108 (90 BASE) MCG/ACT IN AERS: 1.0000 | INHALATION_SPRAY | Freq: Four times a day (QID) | RESPIRATORY_TRACT | Status: DC | PRN

## 2013-09-11 MED ORDER — GUAIFENESIN-CODEINE 100-10 MG/5ML PO SOLN: 10.0000 mL | ORAL | Status: DC | PRN

## 2013-09-11 NOTE — ED Notes (Addendum)
BNP 341.7 H.  Message sent to [name] PA. 09/11/2013 He wrote "not that high," no further action needed. Ernest Lara 09/13/2013

## 2013-09-11 NOTE — Discharge Instructions (Signed)
Bronchitis  Bronchitis is inflammation of the airways that extend from the windpipe into the lungs (bronchi). The inflammation often causes mucus to develop, which leads to a cough. If the inflammation becomes severe, it may cause shortness of breath.  CAUSES   Bronchitis may be caused by:   · Viral infections.    · Bacteria.    · Cigarette smoke.    · Allergens, pollutants, and other irritants.    SIGNS AND SYMPTOMS   The most common symptom of bronchitis is a frequent cough that produces mucus. Other symptoms include:  · Fever.    · Body aches.    · Chest congestion.    · Chills.    · Shortness of breath.    · Sore throat.    DIAGNOSIS   Bronchitis is usually diagnosed through a medical history and physical exam. Tests, such as chest X-rays, are sometimes done to rule out other conditions.   TREATMENT   You may need to avoid contact with whatever caused the problem (smoking, for example). Medicines are sometimes needed. These may include:  · Antibiotics. These may be prescribed if the condition is caused by bacteria.  · Cough suppressants. These may be prescribed for relief of cough symptoms.    · Inhaled medicines. These may be prescribed to help open your airways and make it easier for you to breathe.    · Steroid medicines. These may be prescribed for those with recurrent (chronic) bronchitis.  HOME CARE INSTRUCTIONS  · Get plenty of rest.    · Drink enough fluids to keep your urine clear or pale yellow (unless you have a medical condition that requires fluid restriction). Increasing fluids may help thin your secretions and will prevent dehydration.    · Only take over-the-counter or prescription medicines as directed by your health care provider.  · Only take antibiotics as directed. Make sure you finish them even if you start to feel better.  · Avoid secondhand smoke, irritating chemicals, and strong fumes. These will make bronchitis worse. If you are a smoker, quit smoking. Consider using nicotine gum or  skin patches to help control withdrawal symptoms. Quitting smoking will help your lungs heal faster.    · Put a cool-mist humidifier in your bedroom at night to moisten the air. This may help loosen mucus. Change the water in the humidifier daily. You can also run the hot water in your shower and sit in the bathroom with the door closed for 5 10 minutes.    · Follow up with your health care provider as directed.    · Wash your hands frequently to avoid catching bronchitis again or spreading an infection to others.    SEEK MEDICAL CARE IF:  Your symptoms do not improve after 1 week of treatment.   SEEK IMMEDIATE MEDICAL CARE IF:  · Your fever increases.  · You have chills.    · You have chest pain.    · You have worsening shortness of breath.    · You have bloody sputum.  · You faint.    · You have lightheadedness.  · You have a severe headache.    · You vomit repeatedly.  MAKE SURE YOU:   · Understand these instructions.  · Will watch your condition.  · Will get help right away if you are not doing well or get worse.  Document Released: 05/27/2005 Document Revised: 03/17/2013 Document Reviewed: 01/19/2013  ExitCare® Patient Information ©2014 [employer].    Antibiotic Nonuse   Your caregiver felt that the infection or problem was not one that would be helped with an antibiotic.  Infections   may be caused by viruses or bacteria. Only a caregiver can tell which one of these is the likely cause of an illness. A cold is the most common cause of infection in both adults and children. A cold is a virus. Antibiotic treatment will have no effect on a viral infection. Viruses can lead to many lost days of work caring for sick children and many missed days of school. Children may catch as many as 10 "colds" or "flus" per year during which they can be tearful, cranky, and uncomfortable. The goal of treating a virus is aimed at keeping the ill person comfortable.  Antibiotics are medications used to help the body fight  bacterial infections. There are relatively few types of bacteria that cause infections but there are hundreds of viruses. While both viruses and bacteria cause infection they are very different types of germs. A viral infection will typically go away by itself within 7 to 10 days. Bacterial infections may spread or get worse without antibiotic treatment.  Examples of bacterial infections are:  · Sore throats (like strep throat or tonsillitis).  · Infection in the lung (pneumonia).  · Ear and skin infections.  Examples of viral infections are:  · Colds or flus.  · Most coughs and bronchitis.  · Sore throats not caused by Strep.  · Runny noses.  It is often best not to take an antibiotic when a viral infection is the cause of the problem. Antibiotics can kill off the helpful bacteria that we have inside our body and allow harmful bacteria to start growing. Antibiotics can cause side effects such as allergies, nausea, and diarrhea without helping to improve the symptoms of the viral infection. Additionally, repeated uses of antibiotics can cause bacteria inside of our body to become resistant. That resistance can be passed onto harmful bacterial. The next time you have an infection it may be harder to treat if antibiotics are used when they are not needed. Not treating with antibiotics allows our own immune system to develop and take care of infections more efficiently. Also, antibiotics will work better for us when they are prescribed for bacterial infections.  Treatments for a child that is ill may include:  · Give extra fluids throughout the day to stay hydrated.  · Get plenty of rest.  · Only give your child over-the-counter or prescription medicines for pain, discomfort, or fever as directed by your caregiver.  · The use of a cool mist humidifier may help stuffy noses.  · Cold medications if suggested by your caregiver.  Your caregiver may decide to start you on an antibiotic if:  · The problem you were seen for  today continues for a longer length of time than expected.  · You develop a secondary bacterial infection.  SEEK MEDICAL CARE IF:  · Fever lasts longer than 5 days.  · Symptoms continue to get worse after 5 to 7 days or become severe.  · Difficulty in breathing develops.  · Signs of dehydration develop (poor drinking, rare urinating, dark colored urine).  · Changes in behavior or worsening tiredness (listlessness or lethargy).  Document Released: 08/05/2001 Document Revised: 08/19/2011 Document Reviewed: 02/01/2009  ExitCare® Patient Information ©2014 [employer].

## 2013-09-11 NOTE — ED Notes (Signed)
Pt c/o persistent dry cough onset 10 days Sxs also include: ST and difficult swallowing Denies f/v/n/d, SOB, wheezing, congestin, runny nose Taking mucinex w/no relief Alert w/no signs of acute distress.

## 2013-09-11 NOTE — ED Provider Notes (Signed)
CSN: [id number]     Arrival date & time 09/11/13  0901 History   First MD Initiated Contact with Patient 09/11/13 0920     Chief Complaint  Patient presents with  . Cough   (Consider location/radiation/quality/duration/timing/severity/associated sxs/prior Treatment) HPI Comments: 73 year old male with history of atrial fibrillation , CHF presents complaining of dry cough getting progressively worse for 10 days. In the last 2 days he is also started to have a sore throat. He feels like he has a cold that will does not go away. He denies fever, chills, NVD, chest pain, shortness of breath, leg swelling, fatigability, shortness of breath on exertion  Patient is a 73 y.o. male presenting with cough.  Cough Associated symptoms: sore throat   Associated symptoms: no chest pain, no chills, no fever, no rhinorrhea and no shortness of breath     Past Medical History  Diagnosis Date  . Hypertension   . Diabetes mellitus   . DM (diabetes mellitus), type 2  10/08/2011  . HTN (hypertension) 10/08/2011  . 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 afib 10/08/2011  . CHF (congestive heart failure)     mild   . OSA (obstructive sleep apnea), pt with apnea during procedures 10/11/2011  . CKD (chronic kidney disease) stage 3, GFR 30-59 ml/min 10/13/2011  . Systolic dysfunction    Past Surgical History  Procedure Laterality Date  . No past surgeries    . Tee without cardioversion  10/10/2011    Procedure: TRANSESOPHAGEAL ECHOCARDIOGRAM (TEE);  Surgeon: Sanda Klein, MD;  Location: [facility];  Service: Cardiovascular;  Laterality: N/A;  . Cardioversion  10/11/2011    Procedure: CARDIOVERSION;  Surgeon: Leonie Man, MD;  Location: [facility];  Service: Cardiovascular;  Laterality: N/A;  . US echocardiography  06/09/2012    EF 40-45%,mild concentric hypertrophy  . Nuclear stress  11/01/2011    severe global hypokinesis,dilated ventricle   No family history on file. History   Substance Use Topics  . Smoking status: Never Smoker   . Smokeless tobacco: Never Used  . Alcohol Use: 0.6 oz/week    1 Shots of liquor per week     Comment: one drink per week"    Review of Systems  Constitutional: Negative for fever, chills and fatigue.  HENT: Positive for sore throat. Negative for congestion and rhinorrhea.   Respiratory: Positive for cough. Negative for chest tightness and shortness of breath.   Cardiovascular: Negative for chest pain and leg swelling.  Gastrointestinal: Negative for nausea and vomiting.  All other systems reviewed and are negative.    Allergies  Amiodarone  Home Medications   Current Outpatient Rx  Name  Route  Sig  Dispense  Refill  . benazepril (LOTENSIN) 40 MG tablet   Oral   Take 40 mg by mouth daily.         . carvedilol (COREG) 25 MG tablet      Take a half tablet (12.5 mg) every morning and a whole tablet (25 mg) every evening   135 tablet   3   . doxazosin (CARDURA) 8 MG tablet   Oral   Take 1 tablet (8 mg total) by mouth at bedtime.   30 tablet   8   . furosemide (LASIX) 80 MG tablet   Oral   Take 1 tablet (80 mg total) by mouth daily.   60 tablet   5   . glyBURIDE-metformin (GLUCOVANCE) 5-500 MG per tablet   Oral  Take 1 tablet by mouth 2 (two) times daily with a meal.         . potassium chloride (K-DUR,KLOR-CON) 10 MEQ tablet   Oral   Take 2 tablets (20 mEq total) by mouth daily.   60 tablet   11   . Rivaroxaban (XARELTO) 20 MG TABS tablet   Oral   Take 1 tablet (20 mg total) by mouth daily with supper.   30 tablet   5   . rosuvastatin (CRESTOR) 20 MG tablet   Oral   Take 20 mg by mouth at bedtime.         . sitaGLIPtin (JANUVIA) 100 MG tablet   Oral   Take 100 mg by mouth daily.         Marland Kitchen albuterol (PROVENTIL HFA;VENTOLIN HFA) 108 (90 BASE) MCG/ACT inhaler   Inhalation   Inhale 1-2 puffs into the lungs every 6 (six) hours as needed (for cough).   1 Inhaler   0   . EXPIRED:  furosemide (LASIX) 80 MG tablet   Oral   Take 1 tablet (80 mg total) by mouth 2 (two) times daily.   60 tablet   11   . guaiFENesin-codeine 100-10 MG/5ML syrup   Oral   Take 10 mLs by mouth every 4 (four) hours as needed for cough.   120 mL   0    BP 152/99  Pulse 78  Temp(Src) 97.9 F (36.6 C) (Oral)  Resp 20  SpO2 99% Physical Exam  Nursing note and vitals reviewed. Constitutional: He is oriented to person, place, and time. He appears well-developed and well-nourished. No distress.  HENT:  Head: Normocephalic and atraumatic.  Nose: Nose normal. Right sinus exhibits no maxillary sinus tenderness and no frontal sinus tenderness. Left sinus exhibits no maxillary sinus tenderness and no frontal sinus tenderness.  Mouth/Throat: Uvula is midline, oropharynx is clear and moist and mucous membranes are normal.  Neck: Normal range of motion. Neck supple.  Cardiovascular: Normal rate, regular rhythm and normal heart sounds.  Exam reveals no gallop and no friction rub.   No murmur heard. Pulmonary/Chest: Effort normal and breath sounds normal. No respiratory distress. He has no wheezes. He has no rales.  Abdominal: Soft. There is no tenderness.  Lymphadenopathy:    He has no cervical adenopathy.  Neurological: He is alert and oriented to person, place, and time. Coordination normal.  Skin: Skin is warm and dry. No rash noted. He is not diaphoretic.  Psychiatric: He has a normal mood and affect. Judgment normal.    ED Course  Procedures (including critical care time) Labs Review Labs Reviewed  CBC WITH DIFFERENTIAL - Abnormal; Notable for the following:    Hemoglobin 11.6 (*)    HCT 33.7 (*)    MCV 76.9 (*)    All other components within normal limits  POCT I-STAT, CHEM 8 - Abnormal; Notable for the following:    Creatinine, Ser 1.40 (*)    Glucose, Bld 185 (*)    Calcium, Ion 1.33 (*)    Hemoglobin 12.2 (*)    HCT 36.0 (*)    All other components within normal limits   CULTURE, GROUP A STREP  PRO B NATRIURETIC PEPTIDE  POCT RAPID STREP A ([facility]RG CARE ONLY)   Imaging Review Dg Chest 2 View  09/11/2013   CLINICAL DATA:  Cough  EXAM: CHEST  2 VIEW  COMPARISON:  10/12/2011  FINDINGS: Cardiomediastinal silhouette is stable. No acute infiltrate or pulmonary edema. Central  mild bronchitic changes. Mild degenerative changes thoracic spine.  IMPRESSION: No acute infiltrate or pulmonary edema. Central mild bronchitic changes.   Electronically Signed   By: Lahoma Crocker M.D.   On: 09/11/2013 10:06     MDM   1. Bronchitis    Labs OK, no leukocytosis, CXR shows no PNA, Creatinine is better than baseline.  EKG again shows 1st degree AV block and LBBB, unchanged from previous.  BNP pending but nothing except an extremely high value would affect management at this point.  Discharge with bronchodilator and cough suppressant, f/u PRN if not improving    Meds ordered this encounter  Medications  . albuterol (PROVENTIL HFA;VENTOLIN HFA) 108 (90 BASE) MCG/ACT inhaler    Sig: Inhale 1-2 puffs into the lungs every 6 (six) hours as needed (for cough).    Dispense:  1 Inhaler    Refill:  0    Order Specific Question:  Supervising Provider    Answer:  Ihor Gully D [id number]  . guaiFENesin-codeine 100-10 MG/5ML syrup    Sig: Take 10 mLs by mouth every 4 (four) hours as needed for cough.    Dispense:  120 mL    Refill:  0    Order Specific Question:  Supervising Provider    Answer:  Ihor Gully D [place], PA-C 09/11/13 1015

## 2013-09-11 NOTE — ED Provider Notes (Signed)
Medical screening examination/treatment/procedure(s) were performed by resident physician or non-physician practitioner and as supervising physician I was immediately available for consultation/collaboration.   Pauline Good MD.   Billy Fischer, MD 09/11/13 [phone]

## 2013-09-13 LAB — CULTURE, GROUP A STREP

## 2013-09-14 ENCOUNTER — Emergency Department (HOSPITAL_COMMUNITY)
Admission: EM | Admit: 2013-09-14 | Discharge: 2013-09-14 | Discharge status: Absent without leave | Payer: Medicare Other | Source: Home / Self Care

## 2013-09-14 NOTE — ED Notes (Signed)
No answer in lobby x 2.

## 2013-09-14 NOTE — ED Notes (Signed)
No answer in lobby x1

## 2013-09-15 MED ORDER — GUAIFENESIN-CODEINE 100-10 MG/5ML PO SYRP: 10.0000 mL | ORAL_SOLUTION | Freq: Four times a day (QID) | ORAL | Status: DC | PRN

## 2013-09-15 MED ORDER — AMOXICILLIN 500 MG PO CAPS: 500.0000 mg | ORAL_CAPSULE | Freq: Three times a day (TID) | ORAL | Status: DC

## 2013-09-15 NOTE — ED Notes (Signed)
Pt  Phoned  Requesting a  Refill  On his  meds       He is   Refill     On his  Cough  meds       Dr   Jake Michaelis   Notified  rx  Written pt  Will  Pick them  Up

## 2013-09-15 NOTE — ED Provider Notes (Signed)
Addendum: This gentleman called back today, states he still has a cough and now has a sore throat. He's requesting a refill for his guaifenesin/codeine cough syrup and also antibiotic. He was provided with prescriptions for guaifenesin/codeine 100/10, 4 ounces, 2 teaspoons every 6 hours, and amoxicillin 500 mg #30 one 3 times a day.  Harden Mo, MD 09/15/13 [phone]

## 2013-09-22 ENCOUNTER — Other Ambulatory Visit: Payer: Self-pay | Admitting: *Deleted

## 2013-09-22 MED ORDER — CARVEDILOL 25 MG PO TABS: ORAL_TABLET | ORAL | Status: DC

## 2013-09-22 NOTE — Telephone Encounter (Signed)
Rx refill sent to patient pharmacy   

## 2013-10-18 ENCOUNTER — Other Ambulatory Visit: Payer: Self-pay | Admitting: Cardiology

## 2013-10-18 NOTE — Telephone Encounter (Signed)
Rx was sent to pharmacy electronically. 

## 2013-10-20 ENCOUNTER — Other Ambulatory Visit: Payer: Self-pay

## 2013-10-20 ENCOUNTER — Other Ambulatory Visit: Payer: Self-pay | Admitting: Pharmacist Clinician (PhC)/ Clinical Pharmacy Specialist

## 2013-10-20 MED ORDER — RIVAROXABAN 20 MG PO TABS: 20.0000 mg | ORAL_TABLET | Freq: Every day | ORAL | Status: DC

## 2013-10-20 NOTE — Telephone Encounter (Signed)
Rx was sent to pharmacy electronically. 

## 2013-11-02 DIAGNOSIS — I10 Essential (primary) hypertension: Secondary | ICD-10-CM

## 2013-11-02 DIAGNOSIS — Z01810 Encounter for preprocedural cardiovascular examination: Secondary | ICD-10-CM

## 2013-11-03 ENCOUNTER — Ambulatory Visit: Payer: Self-pay | Admitting: Ophthalmology

## 2013-11-26 ENCOUNTER — Other Ambulatory Visit: Payer: Self-pay | Admitting: *Deleted

## 2013-11-26 MED ORDER — DOXAZOSIN MESYLATE 8 MG PO TABS: 8.0000 mg | ORAL_TABLET | Freq: Every day | ORAL | Status: DC

## 2013-11-26 MED ORDER — POTASSIUM CHLORIDE CRYS ER 10 MEQ PO TBCR: 20.0000 meq | EXTENDED_RELEASE_TABLET | Freq: Every day | ORAL | Status: DC

## 2014-05-17 ENCOUNTER — Other Ambulatory Visit: Payer: Self-pay | Admitting: Cardiovascular Disease

## 2014-06-06 ENCOUNTER — Telehealth: Payer: Self-pay | Admitting: Cardiovascular Disease

## 2014-06-06 NOTE — Telephone Encounter (Signed)
Closed encounter °

## 2014-06-17 ENCOUNTER — Encounter: Payer: Self-pay | Admitting: Cardiovascular Disease

## 2014-06-17 ENCOUNTER — Ambulatory Visit (INDEPENDENT_AMBULATORY_CARE_PROVIDER_SITE_OTHER): Payer: Medicare Other | Admitting: Cardiovascular Disease

## 2014-06-17 VITALS — BP 127/72 | HR 68 | Resp 16 | Ht 74.0 in | Wt 265.0 lb

## 2014-06-17 DIAGNOSIS — E785 Hyperlipidemia, unspecified: Secondary | ICD-10-CM

## 2014-06-17 DIAGNOSIS — J708 Respiratory conditions due to other specified external agents: Secondary | ICD-10-CM

## 2014-06-17 DIAGNOSIS — N183 Chronic kidney disease, stage 3 unspecified: Secondary | ICD-10-CM

## 2014-06-17 DIAGNOSIS — J984 Other disorders of lung: Secondary | ICD-10-CM

## 2014-06-17 DIAGNOSIS — G4733 Obstructive sleep apnea (adult) (pediatric): Secondary | ICD-10-CM

## 2014-06-17 DIAGNOSIS — T462X5A Adverse effect of other antidysrhythmic drugs, initial encounter: Secondary | ICD-10-CM

## 2014-06-17 DIAGNOSIS — I447 Left bundle-branch block, unspecified: Secondary | ICD-10-CM

## 2014-06-17 DIAGNOSIS — I420 Dilated cardiomyopathy: Secondary | ICD-10-CM

## 2014-06-17 DIAGNOSIS — I5042 Chronic combined systolic (congestive) and diastolic (congestive) heart failure: Secondary | ICD-10-CM

## 2014-06-17 DIAGNOSIS — I480 Paroxysmal atrial fibrillation: Secondary | ICD-10-CM

## 2014-06-17 NOTE — Progress Notes (Signed)
Patient ID: Ernest Lara, male   DOB: Nov 22, 1940, 74 y.o.   MRN: [id number]      Reason for office visit  Ernest Lara is now 74 years old and feels great. He walks 3 days a week 3045 minutes at a time, covering roughly 1-1/2 miles each time. He does not feel in any way limited in his physical activity by shortness of breath. He denies chest pain, dizziness, weakness, lower extremity edema, excessive fatigue. He has treated hypertension, diabetes mellitus type 2 and hyperlipidemia and has mildly depressed left ventricular systolic function secondary to nonischemic cardiomyopathy. He has had both atrial flutter and atrial fibrillation in the past. He is generally unaware of the palpitations. He tolerates full anticoagulation with Xarelto without any bleeding complications and does not have a history of stroke or TIA  He presented in 2013 with atrial fibrillation rapid ventricular response and congestive heart failure. Left ventricular ejection fraction was severely reduced but improved back to 40-45% after arrhythmia control (last assessed December 2013). He had a normal perfusion by nuclear stress test in May 2013. He has not had a stroke or embolic events and is tolerating a novel anticoagulant without any bleeding problem.  He has left bundle branch block, first-degree AV block and a tendency to bradycardia. There is no history of syncope or symptomatic bradycardia.   Allergies  Allergen Reactions  . Amiodarone Shortness Of Breath    Pulmonary toxicity with amiodarone    Current Outpatient Prescriptions  Medication Sig Dispense Refill  . benazepril (LOTENSIN) 40 MG tablet Take 40 mg by mouth daily.    . carvedilol (COREG) 25 MG tablet Take a half tablet (12.5 mg) every morning and a whole tablet (25 mg) every evening (Patient taking differently: Take 25 mg by mouth 2 (two) times daily with a meal. ) 135 tablet 3  . doxazosin (CARDURA) 8 MG tablet Take 1 tablet (8 mg total) by mouth at  bedtime. 30 tablet 5  . furosemide (LASIX) 80 MG tablet Take 1 tablet (80 mg total) by mouth daily. 60 tablet 5  . glyBURIDE-metformin (GLUCOVANCE) 5-500 MG per tablet Take 1 tablet by mouth 2 (two) times daily with a meal.    . potassium chloride (KLOR-CON M10) 10 MEQ tablet Take 2 tablets (20 mEq total) by mouth daily. 60 tablet 5  . rosuvastatin (CRESTOR) 20 MG tablet Take 20 mg by mouth at bedtime.    . sitaGLIPtin (JANUVIA) 100 MG tablet Take 100 mg by mouth daily.    Alveda Reasons 20 MG TABS tablet TAKE 1 TABLET (20 MG TOTAL) BY MOUTH DAILY WITH SUPPER. 30 tablet 5   No current facility-administered medications for this visit.    Past Medical History  Diagnosis Date  . Hypertension   . Diabetes mellitus   . DM (diabetes mellitus), type 2  10/08/2011  . HTN (hypertension) 10/08/2011  . 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 afib 10/08/2011  . CHF (congestive heart failure)     mild   . OSA (obstructive sleep apnea), pt with apnea during procedures 10/11/2011  . CKD (chronic kidney disease) stage 3, GFR 30-59 ml/min 10/13/2011  . Systolic dysfunction     Past Surgical History  Procedure Laterality Date  . No past surgeries    . Tee without cardioversion  10/10/2011    Procedure: TRANSESOPHAGEAL ECHOCARDIOGRAM (TEE);  Surgeon: Sanda Klein, MD;  Location: [facility];  Service: Cardiovascular;  Laterality: N/A;  . Cardioversion  10/11/2011  Procedure: CARDIOVERSION;  Surgeon: Leonie Man, MD;  Location: [facility];  Service: Cardiovascular;  Laterality: N/A;  . US echocardiography  06/09/2012    EF 40-45%,mild concentric hypertrophy  . Nuclear stress  11/01/2011    severe global hypokinesis,dilated ventricle    No family history on file.  History   Social History  . Marital Status: Married    Spouse Name: N/A    Number of Children: N/A  . Years of Education: N/A   Occupational History  . Not on file.   Social History Main Topics  . Smoking status:  Never Smoker   . Smokeless tobacco: Never Used  . Alcohol Use: 0.6 oz/week    1 Shots of liquor per week     Comment: one drink per week"  . Drug Use: No  . Sexual Activity: Not Currently   Other Topics Concern  . Not on file   Social History Narrative    Review of systems: The patient specifically denies any chest pain at rest or with exertion, dyspnea at rest or with exertion, orthopnea, paroxysmal nocturnal dyspnea, syncope, palpitations, focal neurological deficits, intermittent claudication, lower extremity edema, unexplained weight gain, cough, hemoptysis or wheezing.  The patient also denies abdominal pain, nausea, vomiting, dysphagia, diarrhea, constipation, polyuria, polydipsia, dysuria, hematuria, frequency, urgency, abnormal bleeding or bruising, fever, chills, unexpected weight changes, mood swings, change in skin or hair texture, change in voice quality, auditory or visual problems, allergic reactions or rashes, new musculoskeletal complaints other than usual "aches and pains".   PHYSICAL EXAM BP 127/72 mmHg  Pulse 68  Resp 16  Ht 6\' 2"  (1.88 m)  Wt 265 lb (120.203 kg)  BMI 34.01 kg/m2  General: Alert, oriented x3, no distress Head: no evidence of trauma, PERRL, EOMI, no exophtalmos or lid lag, no myxedema, no xanthelasma; normal ears, nose and oropharynx Neck: normal jugular venous pulsations and no hepatojugular reflux; brisk carotid pulses without delay and no carotid bruits Chest: clear to auscultation, no signs of consolidation by percussion or palpation, normal fremitus, symmetrical and full respiratory excursions Cardiovascular: normal position and quality of the apical impulse, regular rhythm, normal first and paradoxically second heart sounds, no murmurs, rubs or gallops Abdomen: no tenderness or distention, no masses by palpation, no abnormal pulsatility or arterial bruits, normal bowel sounds, no hepatosplenomegaly Extremities: no clubbing, cyanosis or edema;  2+ radial, ulnar and brachial pulses bilaterally; 2+ right femoral, posterior tibial and dorsalis pedis pulses; 2+ left femoral, posterior tibial and dorsalis pedis pulses; no subclavian or femoral bruits Neurological: grossly nonfocal   EKG: Sinus rhythm, first-degree A-V block (PR 312 ms), left bundle branch block (QRS 164 ms), QTC 450 ms  Lipid Panel     Component Value Date/Time   CHOL 87 10/09/2011 0523   TRIG 82 10/09/2011 0523   HDL 56 10/09/2011 0523   CHOLHDL 1.6 10/09/2011 0523   VLDL 16 10/09/2011 0523   LDLCALC 15 10/09/2011 0523    BMET    Component Value Date/Time   NA 139 09/11/2013 0954   K 4.1 09/11/2013 0954   CL 102 09/11/2013 0954   CO2 28 10/13/2011 0527   GLUCOSE 185* 09/11/2013 0954   BUN 16 09/11/2013 0954   CREATININE 1.40* 09/11/2013 0954   CALCIUM 10.2 10/13/2011 0527   GFRNONAA 39* 10/13/2011 0527   GFRAA 45* 10/13/2011 0527     ASSESSMENT AND PLAN  Chronic combined systolic and diastolic CHF, NYHA class 1 He continues to be well compensated and  appears clinically euvolemic on a moderate diuretic dose. Recommend continued daily weights, so that we can use this as a warning for new exacerbation.  PAF (paroxysmal atrial fibrillation), recent DCCV 10/11/11 to [facility]. He has not had new clinical episodes of atrial tachyarrhythmia, but he was never really aware of palpitations. 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  Tolerating Xarelto without bleeding so far. So far his arrhythmia has not been symptomatic and we are avoiding antiarrhythmic medication since this would precipitate the need for pacemaker implantation. Similarly, since the arrhythmia appears to be neither prevalent, nor very symptomatic at this time, ablation does not appear to be indicated.  LBBB (left bundle branch block) He has clear evidence of conduction system disease and will likely eventually  need a pacemaker. Whether this is an age-related phenomenon or is part of his cardiomyopathy is less clear. Either way if he does require implantation of a device, he should be reevaluated at that time for CRT-D indication since he has a very broad left bundle branch block.  HTN (hypertension) Excellent control. Continue ACE inhibitors and carvedilol for heart failure.  DM (diabetes mellitus), type 2    Daira Hine  Sanda Klein, MD, [place] [phone] office [phone] pager

## 2014-06-17 NOTE — Patient Instructions (Signed)
Your physician recommends that you schedule a follow-up appointment in: one year with Dr. Croitoru  

## 2014-06-18 ENCOUNTER — Other Ambulatory Visit: Payer: Self-pay | Admitting: Cardiovascular Disease

## 2014-06-20 NOTE — Telephone Encounter (Signed)
Rx refill sent to patient pharmacy   

## 2014-06-24 ENCOUNTER — Ambulatory Visit: Payer: Medicare Other | Admitting: Cardiovascular Disease

## 2014-07-16 ENCOUNTER — Other Ambulatory Visit: Payer: Self-pay | Admitting: Cardiovascular Disease

## 2014-07-18 NOTE — Telephone Encounter (Signed)
Rx(s) sent to pharmacy electronically.  

## 2014-07-20 ENCOUNTER — Other Ambulatory Visit: Payer: Self-pay

## 2014-07-20 MED ORDER — DOXAZOSIN MESYLATE 8 MG PO TABS: 8.0000 mg | ORAL_TABLET | Freq: Every day | ORAL | Status: DC

## 2014-07-20 NOTE — Telephone Encounter (Signed)
Rx(s) sent to pharmacy electronically.  

## 2014-08-10 ENCOUNTER — Other Ambulatory Visit: Payer: Self-pay | Admitting: Cardiovascular Disease

## 2014-08-10 NOTE — Telephone Encounter (Signed)
Rx has been sent to the pharmacy electronically. ° °

## 2014-10-01 NOTE — Op Note (Signed)
PATIENT NAME:  Ernest Lara, Ernest Lara MR#:  [id number] DATE OF BIRTH:  April 01, 1941  DATE OF PROCEDURE:  11/03/2013  PROCEDURES PERFORMED:  1. Pars plana vitrectomy of the left eye.  2. Panretinal photocoagulation of the left eye.   PREOPERATIVE DIAGNOSIS: Vitreous hemorrhage.   POSTOPERATIVE DIAGNOSES:  1. Dense vitreous hemorrhage of the left eye.  2. 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  3. Neovascularization of the retina, left eye.   PRIMARY SURGEON: Garlan Fair, M.D.   ANESTHESIA: Retrobulbar block of the left eye with monitored anesthesia care.   COMPLICATIONS: None.   INDICATIONS FOR PROCEDURE: The patient presented to my office with sudden loss of vision in his left eye. Examination revealed a dense vitreous hemorrhage. Ultrasound showed no signs of retinal detachment. Risks, benefits and alternatives of the above procedure were discussed and the patient wished to proceed.   DETAILS OF PROCEDURE: After informed consent was obtained, the patient was brought into the operative suite at [facility]. The patient was placed in supine position, was given a small dose of Alfenta and a retrobulbar block was performed on the left eye by the primary surgeon without any complications. The left eye was prepped and draped in sterile manner. After lid speculum was inserted, a 23-gauge trocar was placed inferotemporally through displaced conjunctiva 4 mm beyond the limbus in an oblique fashion. The infusion cannula was turned on and inserted through the trocar and secured in position with Steri-Strips. Two more trocars were placed in a similar fashion superotemporally and superonasally. The vitreous cutter and light pipe were introduced in the eye and a core vitrectomy was performed. Peripheral vitreous was trimmed for 360 degrees. Extreme care was taken in the process of the core vitrectomy moving from anterior to posterior until there was visualization of the retina.  Once this was accomplished and peripheral vitreous was trimmed, the retina was visualized and was noted to be attached for 360 degrees. An area of neovascularization was identified in the post-equatorial region associated with what appeared to be a branch retinal vein occlusion at approximately 12 o'clock to 1230. Careful scleral depressed exam was performed for 360 degrees and no signs of any breaks, tears or retinal detachment could be identified for 360 degrees. Endolaser was introduced and 360 degrees of panretinal photocoagulation was performed. Any remnant blood was removed. The vitreous base was trimmed as much as could safely be done to remove as much blood as possible. A complete air-fluid exchange was performed. The trocars were removed and the wounds were noted to be airtight. Pressure in the eye was confirmed to be approximately 10 mmHg. Dexamethasone 5 mg was given into the inferior fornix and the lid speculum was removed. The eye was cleaned and Tobradex was placed on the eye. A patch and shield were placed over the eye and the patient was taken to post-anesthesia care unit with instructions to remain head up.     ____________________________ Teresa Pelton. Starling Manns, MD mfa:es D: 11/03/2013 08:10:47 ET T: 11/03/2013 08:34:21 ET JOB#: [id number]  cc: Teresa Pelton. Starling Manns, MD, <Dictator> Coralee Rud MD ELECTRONICALLY SIGNED 11/03/2013 9:35

## 2014-10-31 ENCOUNTER — Telehealth: Payer: Self-pay | Admitting: Cardiovascular Disease

## 2014-10-31 NOTE — Telephone Encounter (Signed)
Spoke to patient. Instructed OK to reduce furosemide dose, to start daily weights in AM post-void. Keep log of weights. Report changes. Reports understanding w/ given instructions.  Also shares that he was prescribed Allopurinol by PCP.   Advised to take as instructed - if uric acid levels OK at next check, PCP can assess whether to d/c or not, in relation to furosemide dosing needs. Pt verbalized acknowledgement, thanks for the attention and prompt response.

## 2014-10-31 NOTE — Telephone Encounter (Signed)
Ernest Lara is calling because he wants to know if he can reduce Furosemide 80 mg. He has been taking it on a daily basis . Please call    Thanks

## 2014-10-31 NOTE — Telephone Encounter (Signed)
Yes, uric acid elevation could well be furosemide related. Generally not serious unless it is causing gout. Try to reduce to 40 mg daily, but need to carefully monitor weight EVERY day to head off potential CHF exacerbation.

## 2014-10-31 NOTE — Telephone Encounter (Signed)
Left message for patient instructing to return call.

## 2014-10-31 NOTE — Telephone Encounter (Signed)
Spoke to patient. He reports that at last PCP visit, had result on a lab draw that showed uric acid elevation. He is apparently not needing medication for this, however, PCP attributed this possibly to his lasix.  Patient wanted to know if lasix could be reduced due to uric acid elevation.  I discussed diet/nutrition as possible origin. He reports compliance with dietary rec's, especially as laid out for diabetes management. States understanding of potential diet related causes of uric acid elevation. Regarding fluid management, reports daily weights are good - did not get recent weights.  Pt states will gladly comply with whatever Dr. Sallyanne Kuster thinks is best - whether to continue lasix as dosed or otherwise.  Will defer.

## 2014-11-15 ENCOUNTER — Other Ambulatory Visit: Payer: Self-pay | Admitting: Cardiovascular Disease

## 2014-12-17 ENCOUNTER — Other Ambulatory Visit: Payer: Self-pay | Admitting: Cardiovascular Disease

## 2015-05-01 ENCOUNTER — Ambulatory Visit (INDEPENDENT_AMBULATORY_CARE_PROVIDER_SITE_OTHER): Payer: Medicare Other | Admitting: *Deleted

## 2015-05-01 ENCOUNTER — Encounter (HOSPITAL_COMMUNITY): Payer: Self-pay | Admitting: *Deleted

## 2015-05-01 ENCOUNTER — Inpatient Hospital Stay (HOSPITAL_COMMUNITY)
Admission: AD | Admit: 2015-05-01 | Discharge: 2015-05-03 | DRG: 242 | Disposition: A | Discharge status: Home / Self Care | Payer: Medicare Other | Source: Ambulatory Visit | Attending: Cardiology | Admitting: Cardiology

## 2015-05-01 ENCOUNTER — Telehealth: Payer: Self-pay | Admitting: Cardiovascular Disease

## 2015-05-01 VITALS — BP 118/52 | HR 34 | Temp 98.1°F

## 2015-05-01 DIAGNOSIS — I442 Atrioventricular block, complete: Principal | ICD-10-CM | POA: Diagnosis present

## 2015-05-01 DIAGNOSIS — R001 Bradycardia, unspecified: Secondary | ICD-10-CM | POA: Diagnosis present

## 2015-05-01 DIAGNOSIS — I5022 Chronic systolic (congestive) heart failure: Secondary | ICD-10-CM | POA: Diagnosis not present

## 2015-05-01 DIAGNOSIS — I5023 Acute on chronic systolic (congestive) heart failure: Secondary | ICD-10-CM | POA: Diagnosis present

## 2015-05-01 DIAGNOSIS — I10 Essential (primary) hypertension: Secondary | ICD-10-CM | POA: Diagnosis not present

## 2015-05-01 DIAGNOSIS — I4891 Unspecified atrial fibrillation: Secondary | ICD-10-CM | POA: Diagnosis present

## 2015-05-01 DIAGNOSIS — I4892 Unspecified atrial flutter: Secondary | ICD-10-CM | POA: Diagnosis present

## 2015-05-01 DIAGNOSIS — Z7901 Long term (current) use of anticoagulants: Secondary | ICD-10-CM | POA: Diagnosis not present

## 2015-05-01 DIAGNOSIS — E1122 Type 2 diabetes mellitus with diabetic chronic kidney disease: Secondary | ICD-10-CM | POA: Diagnosis present

## 2015-05-01 DIAGNOSIS — I129 Hypertensive chronic kidney disease with stage 1 through stage 4 chronic kidney disease, or unspecified chronic kidney disease: Secondary | ICD-10-CM | POA: Diagnosis present

## 2015-05-01 DIAGNOSIS — J40 Bronchitis, not specified as acute or chronic: Secondary | ICD-10-CM | POA: Diagnosis not present

## 2015-05-01 DIAGNOSIS — N183 Chronic kidney disease, stage 3 (moderate): Secondary | ICD-10-CM | POA: Diagnosis present

## 2015-05-01 DIAGNOSIS — Z950 Presence of cardiac pacemaker: Secondary | ICD-10-CM

## 2015-05-01 HISTORY — DX: Atrioventricular block, complete: I44.2

## 2015-05-01 LAB — COMPREHENSIVE METABOLIC PANEL
ALBUMIN: 3.6 g/dL (ref 3.5–5.0)
ALT: 16 U/L — ABNORMAL LOW (ref 17–63)
ANION GAP: 9 (ref 5–15)
AST: 19 U/L (ref 15–41)
Alkaline Phosphatase: 58 U/L (ref 38–126)
BUN: 29 mg/dL — ABNORMAL HIGH (ref 6–20)
CHLORIDE: 106 mmol/L (ref 101–111)
CO2: 25 mmol/L (ref 22–32)
Calcium: 10.1 mg/dL (ref 8.9–10.3)
Creatinine, Ser: 1.88 mg/dL — ABNORMAL HIGH (ref 0.61–1.24)
GFR calc non Af Amer: 34 mL/min — ABNORMAL LOW (ref >60)
GFR, EST AFRICAN AMERICAN: 39 mL/min — AB (ref >60)
GLUCOSE: 159 mg/dL — AB (ref 65–99)
Potassium: 4.4 mmol/L (ref 3.5–5.1)
SODIUM: 140 mmol/L (ref 135–145)
Total Bilirubin: 0.7 mg/dL (ref 0.3–1.2)
Total Protein: 7.3 g/dL (ref 6.5–8.1)

## 2015-05-01 LAB — CBC WITH DIFFERENTIAL/PLATELET
Basophils Absolute: 0 10*3/uL (ref 0.0–0.1)
Basophils Relative: 0 %
Eosinophils Absolute: 0.1 10*3/uL (ref 0.0–0.7)
Eosinophils Relative: 2 %
HEMATOCRIT: 33.5 % — AB (ref 39.0–52.0)
HEMOGLOBIN: 11.1 g/dL — AB (ref 13.0–17.0)
LYMPHS ABS: 2.1 10*3/uL (ref 0.7–4.0)
LYMPHS PCT: 26 %
MCH: 25.9 pg — AB (ref 26.0–34.0)
MCHC: 33.1 g/dL (ref 30.0–36.0)
MCV: 78.1 fL (ref 78.0–100.0)
Monocytes Absolute: 0.4 10*3/uL (ref 0.1–1.0)
Monocytes Relative: 5 %
NEUTROS ABS: 5.4 10*3/uL (ref 1.7–7.7)
NEUTROS PCT: 67 %
Platelets: 229 10*3/uL (ref 150–400)
RBC: 4.29 MIL/uL (ref 4.22–5.81)
RDW: 14.5 % (ref 11.5–15.5)
WBC: 8 10*3/uL (ref 4.0–10.5)

## 2015-05-01 LAB — GLUCOSE, CAPILLARY
GLUCOSE-CAPILLARY: 185 mg/dL — AB (ref 65–99)
Glucose-Capillary: 136 mg/dL — ABNORMAL HIGH (ref 65–99)

## 2015-05-01 LAB — TSH: TSH: 2.845 u[IU]/mL (ref 0.350–4.500)

## 2015-05-01 LAB — MRSA PCR SCREENING: MRSA BY PCR: NEGATIVE

## 2015-05-01 MED ORDER — SODIUM CHLORIDE 0.9 % IJ SOLN
3.0000 mL | Freq: Two times a day (BID) | INTRAMUSCULAR | Status: DC
Start: 2015-05-01 — End: 2015-05-03
  Administered 2015-05-01 – 2015-05-03 (×4): 3 mL via INTRAVENOUS

## 2015-05-01 MED ORDER — DOXAZOSIN MESYLATE 8 MG PO TABS
8.0000 mg | ORAL_TABLET | Freq: Every day | ORAL | Status: DC
  Administered 2015-05-01 – 2015-05-02 (×2): 8 mg via ORAL
  Filled 2015-05-01 (×3): qty 1

## 2015-05-01 MED ORDER — FUROSEMIDE 80 MG PO TABS: 80.0000 mg | ORAL_TABLET | Freq: Every day | ORAL | Status: DC

## 2015-05-01 MED ORDER — SODIUM CHLORIDE 0.9 % IV SOLN: 250.0000 mL | INTRAVENOUS | Status: DC | PRN

## 2015-05-01 MED ORDER — TRAZODONE HCL 50 MG PO TABS
50.0000 mg | ORAL_TABLET | Freq: Every evening | ORAL | Status: DC | PRN
  Administered 2015-05-01 – 2015-05-02 (×2): 50 mg via ORAL
  Filled 2015-05-01 (×2): qty 1

## 2015-05-01 MED ORDER — FUROSEMIDE 80 MG PO TABS
80.0000 mg | ORAL_TABLET | Freq: Every day | ORAL | Status: DC
  Administered 2015-05-01: 80 mg via ORAL
  Filled 2015-05-01: qty 1

## 2015-05-01 MED ORDER — ACETAMINOPHEN 325 MG PO TABS: 650.0000 mg | ORAL_TABLET | ORAL | Status: DC | PRN

## 2015-05-01 MED ORDER — SODIUM CHLORIDE 0.9 % IJ SOLN: 3.0000 mL | INTRAMUSCULAR | Status: DC | PRN

## 2015-05-01 MED ORDER — RIVAROXABAN 20 MG PO TABS
20.0000 mg | ORAL_TABLET | Freq: Every day | ORAL | Status: DC
  Filled 2015-05-01: qty 1

## 2015-05-01 MED ORDER — BENAZEPRIL HCL 20 MG PO TABS
40.0000 mg | ORAL_TABLET | Freq: Every day | ORAL | Status: DC
  Filled 2015-05-01: qty 2

## 2015-05-01 MED ORDER — ROSUVASTATIN CALCIUM 20 MG PO TABS
20.0000 mg | ORAL_TABLET | Freq: Every day | ORAL | Status: DC
  Administered 2015-05-01 – 2015-05-02 (×2): 20 mg via ORAL
  Filled 2015-05-01 (×2): qty 1

## 2015-05-01 MED ORDER — INSULIN ASPART 100 UNIT/ML ~~LOC~~ SOLN
0.0000 [IU] | Freq: Three times a day (TID) | SUBCUTANEOUS | Status: DC
Start: 2015-05-02 — End: 2015-05-03
  Administered 2015-05-02: 3 [IU] via SUBCUTANEOUS
  Administered 2015-05-02 – 2015-05-03 (×3): 2 [IU] via SUBCUTANEOUS

## 2015-05-01 MED ORDER — RIVAROXABAN 20 MG PO TABS
20.0000 mg | ORAL_TABLET | Freq: Every day | ORAL | Status: DC
  Administered 2015-05-01: 20 mg via ORAL

## 2015-05-01 MED ORDER — POTASSIUM CHLORIDE CRYS ER 10 MEQ PO TBCR: 20.0000 meq | EXTENDED_RELEASE_TABLET | Freq: Every day | ORAL | Status: DC

## 2015-05-01 MED ORDER — BENAZEPRIL HCL 20 MG PO TABS
40.0000 mg | ORAL_TABLET | Freq: Every day | ORAL | Status: DC
  Administered 2015-05-01 – 2015-05-03 (×3): 40 mg via ORAL
  Filled 2015-05-01 (×3): qty 2

## 2015-05-01 NOTE — Telephone Encounter (Signed)
Spoke with pt, he went out of town over the week end. He woke yesterday with feelings of fatigue. He usually walks and is unable to exercise because feelings of extreme tiredness. He denies SOB, CP, edema or weight change. Denies fever or body aches. He has an appt Monday next week. He wanted to make dr coritoru aware.

## 2015-05-01 NOTE — Progress Notes (Signed)
Pt arrived for blood pressure check. No complaints except for fatigue. ekg complete and shown to dr croitoru.  Pt with complete heart block Pt transported to the hospital for admission.

## 2015-05-01 NOTE — Telephone Encounter (Signed)
Spoke with pt, he will come to the office for bp check and pulse.

## 2015-05-01 NOTE — Progress Notes (Signed)
MEDICATION RELATED CONSULT NOTE - INITIAL   Pharmacy Consult for diabetic medication management   Allergies  Allergen Reactions  . Amiodarone Shortness Of Breath    Pulmonary toxicity with amiodarone    Patient Measurements: Height: 6\' 3"  (190.5 cm) Weight: 258 lb 13.1 oz (117.4 kg) IBW/kg (Calculated) : 84.5  Vital Signs: Temp: 97.4 F (36.3 C) (11/21 1538) Temp Source: Oral (11/21 1538) BP: 155/56 mmHg (11/21 1730) Pulse Rate: 66 (11/21 1730) Intake/Output from previous day:   Intake/Output from this shift:    Labs: No results for input(s): WBC, HGB, HCT, PLT, APTT, CREATININE, LABCREA, CREATININE, CREAT24HRUR, MG, PHOS, ALBUMIN, PROT, ALBUMIN, AST, ALT, ALKPHOS, BILITOT, BILIDIR, IBILI in the last 72 hours. CrCl cannot be calculated (Patient has no serum creatinine result on file.).   Microbiology: No results found for this or any previous visit (from the past 720 hour(s)).  Assessment: 74 yo male admitted for symptomatic, complete heart block with HR in 30s.  PMH: HTN, DM, AFib on rivaroxaban, CRI, sHF, Hx of pulm toxicity with amio  Endo: hx of DM on home metformin, sitagliptan. Finger stick at 1533 was 138.  Goal of Therapy:  Maintain goal glucose levels Prevent Hypoglycemia  Plan:  Hold oral DM meds pending any procedures Initiate low dose SSI 3x daily (hold bedtime insulin to prevent hypoglycemia at this time) Monitor pt's gluc, need for higher insulin, or ability to restart home meds  Levester Fresh, PharmD, BCPS Clinical Pharmacist Pager [phone] 05/01/2015 6:17 PM

## 2015-05-01 NOTE — Telephone Encounter (Signed)
See office note from today

## 2015-05-01 NOTE — Telephone Encounter (Signed)
Ernest Lara is calling because he is stating that he has been weak since yesterday , just not feeling right . Please call    Thanks

## 2015-05-01 NOTE — Telephone Encounter (Signed)
Is he able to check BP and HR?

## 2015-05-01 NOTE — H&P (Signed)
H&P    Patient ID: Ernest Lara MRN: [id number], DOB/AGE: 17-Apr-1941 74 y.o.  Admit date: 05/01/2015 Date of Consult: 05/01/2015   Primary Physician: [name], MD Primary Cardiologist: Dr. Sallyanne Kuster  CC: Complete Heart Block  HPI: Ernest Lara is a 74 y.o. male was referred to [facility] from the office today noting CHB with rates in the 30's. He had called to the office c/o feeling weak, he went into the office to get his BP and HR checked, where he was found bradycardic, in CHB and sent to the hospital for direct admit with plans to implant CRT pacer once his betablocker is washed out.  His PMHx includes, HTN, DM, AFib and flutter on Xarelto, CRI, and CHF (systolic) felt secondary to tachycardia.  He has history of pulmonary toxicity with amiodarone.  The patient tells me that he was feeling well, doing his usual walks 30-33minutes 3x week as usual, the last was Friday, he was in [place] for a football game Saturday and felt well, yesterday he woke feeling extremely tired, weak, had no energy to do anything,  He denies feeling lightheaded or dizzy, just no energy and very unusual for him.  No CP, palpitations, no SOB.  No near syncope or syncope.  Today he again felt the same way and called to his cardiologist office, there he was noted to be in CHB and sent to the hospital.  He denies any unusual or new medicines.  He took his last dose of Carvedilol this morning (12.5mg ).  He reports he takes 12.5mg  ini the morning and 25mg  at night which has been his routine for years.  Past Medical History  Diagnosis Date  . Hypertension   . Diabetes mellitus   . DM (diabetes mellitus), type 2  10/08/2011  . HTN (hypertension) 10/08/2011  . 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 afib 10/08/2011  . CHF (congestive heart failure) (HCC)     mild   . OSA (obstructive sleep apnea), pt with apnea during procedures 10/11/2011  . CKD (chronic kidney disease) stage 3, GFR  30-59 ml/min 10/13/2011  . Systolic dysfunction      Surgical History:  Past Surgical History  Procedure Laterality Date  . No past surgeries    . Tee without cardioversion  10/10/2011    Procedure: TRANSESOPHAGEAL ECHOCARDIOGRAM (TEE);  Surgeon: Sanda Klein, MD;  Location: [place];  Service: Cardiovascular;  Laterality: N/A;  . Cardioversion  10/11/2011    Procedure: CARDIOVERSION;  Surgeon: Leonie Man, MD;  Location: [place];  Service: Cardiovascular;  Laterality: N/A;  . US echocardiography  06/09/2012    EF 40-45%,mild concentric hypertrophy  . Nuclear stress  11/01/2011    severe global hypokinesis,dilated ventricle     Prescriptions prior to admission  Medication Sig Dispense Refill Last Dose  . benazepril (LOTENSIN) 40 MG tablet Take 40 mg by mouth daily.   Taking  . carvedilol (COREG) 25 MG tablet TAKE A HALF TABLET (12.5 MG) EVERY MORNING AND A WHOLE TABLET (25 MG) EVERY EVENING 135 tablet 3   . doxazosin (CARDURA) 8 MG tablet Take 1 tablet (8 mg total) by mouth at bedtime. 30 tablet 11   . furosemide (LASIX) 80 MG tablet TAKE 1 TABLET (80 MG TOTAL) BY MOUTH DAILY. 60 tablet 5   . glyBURIDE-metformin (GLUCOVANCE) 5-500 MG per tablet Take 1 tablet by mouth 2 (two) times daily with a meal.   Taking  . KLOR-CON M10 10 MEQ tablet  TAKE 2 TABLETS (20 MEQ TOTAL) BY MOUTH DAILY. 60 tablet 5   . rosuvastatin (CRESTOR) 20 MG tablet Take 20 mg by mouth at bedtime.   Taking  . sitaGLIPtin (JANUVIA) 100 MG tablet Take 100 mg by mouth daily.   Taking  . XARELTO 20 MG TABS tablet TAKE 1 TABLET (20 MG TOTAL) BY MOUTH DAILY WITH SUPPER. 30 tablet 5     Inpatient Medications:   Allergies:  Allergies  Allergen Reactions  . Amiodarone Shortness Of Breath    Pulmonary toxicity with amiodarone    Social History   Social History  . Marital Status: Married    Spouse Name: N/A  . Number of Children: N/A  . Years of Education: N/A   Occupational History  . Not on file.   Social  History Main Topics  . Smoking status: Never Smoker   . Smokeless tobacco: Never Used  . Alcohol Use: No  . Drug Use: No  . Sexual Activity: Not Currently   Other Topics Concern  . Not on file   Social History Narrative     Family History  Problem Relation Age of Onset  . Cancer Mother     Died of colon cancer young  . Kidney disease Father     ESRF/HD, deceased     Review of Systems: All other systems reviewed and are otherwise negative except as noted above.  Physical Exam: Filed Vitals:   05/01/15 1538  BP: 151/60  Pulse: 33  TempSrc: Oral  Resp: 20  Height: 6\' 3"  (1.905 m)  Weight: 258 lb 13.1 oz (117.4 kg)  SpO2: 98%    GEN- The patient is well appearing, alert and oriented x 3 today.   HEENT: normocephalic, atraumatic; sclera clear, conjunctiva pink; hearing intact; oropharynx clear; neck supple, no JVP Lymph- no cervical lymphadenopathy Lungs- Clear to ausculation bilaterally, normal work of breathing.  No wheezes, rales, rhonchi Heart- Regular rate and rhythm, bradycardic, no murmurs, rubs or gallops, PMI not laterally displaced GI- soft, non-tender, non-distended Extremities- no clubbing, cyanosis, or edema MS- no significant deformity or atrophy Skin- warm and dry, no rash or lesion Psych- euthymic mood, full affect Neuro- no gross deficits observed  Labs:   Lab Results  Component Value Date   WBC 6.2 09/11/2013   HGB 12.2* 09/11/2013   HCT 36.0* 09/11/2013   MCV 76.9* 09/11/2013   PLT 235 09/11/2013   06/09/12: Echocardiogram   Study Conclusions - Left ventricle: The cavity size was normal. There was mild concentric hypertrophy. Systolic function was mildly to moderately reduced. The estimated ejection fraction was in the range of 40% to 45%. Diffuse hypokinesis. Features are consistent with a pseudonormal left ventricular filling pattern, with concomitant abnormal relaxation and increased filling pressure (grade 2  diastolic dysfunction). Doppler parameters are consistent with elevated mean left atrial filling pressure. - Ventricular septum: Septal motion showed paradox. - Mitral valve: Valve area by pressure half-time: 2.22cm^2. - Left atrium: The atrium was mildly dilated. - Atrial septum: No defect or patent foramen ovale was identified.  EKG: CHB, 33bpm, IVCD EKG done at the office was reviewed by Dr. Sallyanne Kuster and was CHB TELEMETRY: CHB, 32bpm currently   Assessment and Plan:  1. Symptomatic bradycardia      Admit ICU bed      CHB      Chronically on coreg, Azura Tufaro need to allow washout period      Zoll pads are on patient      BP stable  Bedrest      Check an echo, plan for CRT pacer if bradycardia/heart block does not resolve off betablocker  2. HTN     Appears stable, NO NODAL BLOCKING AGENTS for BP control 3. DM     Keaun Schnabel ask pharmacy to aid in DM management 4. PAFib/flutter     CHADS2Vasc is at least 3 on Xarelto  The patient requests a sleep aid, Jazzlin Clements order Trazodone as per Dr. Curt Bears.    Venetia Night, PA-C 05/01/2015 4:08 PM   I have seen and examined this patient with Tommye Standard on 05/01/15.  Agree with above, note added to reflect my findings.  On exam, bradycardic rhythm, no murmurs, lungs clear.  Patient without complaints other than fatigue.  Found to be in heart block on ECG at the office.  Stable currently.  On beta blockers, Mazey Mantell hold and plan for possible pacemaker Wednesday.    Azariah Bonura M. Maryagnes Carrasco MD 05/02/2015 7:11 AM

## 2015-05-02 ENCOUNTER — Inpatient Hospital Stay (HOSPITAL_COMMUNITY): Payer: Medicare Other

## 2015-05-02 ENCOUNTER — Encounter (HOSPITAL_COMMUNITY)
Admission: AD | Disposition: A | Discharge status: Home / Self Care | Payer: Medicare Other | Source: Ambulatory Visit | Attending: Cardiology

## 2015-05-02 DIAGNOSIS — I519 Heart disease, unspecified: Secondary | ICD-10-CM

## 2015-05-02 DIAGNOSIS — I480 Paroxysmal atrial fibrillation: Secondary | ICD-10-CM

## 2015-05-02 DIAGNOSIS — J40 Bronchitis, not specified as acute or chronic: Secondary | ICD-10-CM

## 2015-05-02 DIAGNOSIS — I442 Atrioventricular block, complete: Secondary | ICD-10-CM

## 2015-05-02 DIAGNOSIS — N183 Chronic kidney disease, stage 3 (moderate): Secondary | ICD-10-CM

## 2015-05-02 DIAGNOSIS — I5022 Chronic systolic (congestive) heart failure: Secondary | ICD-10-CM

## 2015-05-02 DIAGNOSIS — I10 Essential (primary) hypertension: Secondary | ICD-10-CM

## 2015-05-02 HISTORY — PX: EP IMPLANTABLE DEVICE: SHX172B

## 2015-05-02 LAB — BASIC METABOLIC PANEL
Anion gap: 8 (ref 5–15)
BUN: 31 mg/dL — ABNORMAL HIGH (ref 6–20)
CALCIUM: 9.7 mg/dL (ref 8.9–10.3)
CHLORIDE: 106 mmol/L (ref 101–111)
CO2: 26 mmol/L (ref 22–32)
CREATININE: 1.97 mg/dL — AB (ref 0.61–1.24)
GFR, EST AFRICAN AMERICAN: 37 mL/min — AB (ref >60)
GFR, EST NON AFRICAN AMERICAN: 32 mL/min — AB (ref >60)
Glucose, Bld: 170 mg/dL — ABNORMAL HIGH (ref 65–99)
Potassium: 4.4 mmol/L (ref 3.5–5.1)
SODIUM: 140 mmol/L (ref 135–145)

## 2015-05-02 LAB — GLUCOSE, CAPILLARY
GLUCOSE-CAPILLARY: 184 mg/dL — AB (ref 65–99)
Glucose-Capillary: 240 mg/dL — ABNORMAL HIGH (ref 65–99)
Glucose-Capillary: 171 mg/dL — ABNORMAL HIGH (ref 65–99)
Glucose-Capillary: 237 mg/dL — ABNORMAL HIGH (ref 65–99)

## 2015-05-02 SURGERY — BIV PACEMAKER INSERTION CRT-P

## 2015-05-02 MED ORDER — VANCOMYCIN HCL IN DEXTROSE 1-5 GM/200ML-% IV SOLN
1000.0000 mg | INTRAVENOUS | Status: DC
  Filled 2015-05-02: qty 200

## 2015-05-02 MED ORDER — MIDAZOLAM HCL 5 MG/5ML IJ SOLN
INTRAMUSCULAR | Status: DC | PRN
  Administered 2015-05-02 (×2): 1 mg via INTRAVENOUS

## 2015-05-02 MED ORDER — CEFAZOLIN SODIUM-DEXTROSE 2-3 GM-% IV SOLR
2.0000 g | INTRAVENOUS | Status: DC
  Filled 2015-05-02 (×2): qty 50

## 2015-05-02 MED ORDER — ONDANSETRON HCL 4 MG/2ML IJ SOLN
INTRAMUSCULAR | Status: AC
  Filled 2015-05-02: qty 2

## 2015-05-02 MED ORDER — SODIUM CHLORIDE 0.9 % IR SOLN
Status: AC
  Filled 2015-05-02: qty 2

## 2015-05-02 MED ORDER — LIDOCAINE HCL (PF) 1 % IJ SOLN
INTRAMUSCULAR | Status: AC
  Filled 2015-05-02: qty 60

## 2015-05-02 MED ORDER — CEFAZOLIN SODIUM-DEXTROSE 2-3 GM-% IV SOLR
INTRAVENOUS | Status: DC | PRN
  Administered 2015-05-02: 2 g via INTRAVENOUS

## 2015-05-02 MED ORDER — ONDANSETRON HCL 4 MG/2ML IJ SOLN
INTRAMUSCULAR | Status: DC | PRN
  Administered 2015-05-02: 4 mg via INTRAVENOUS

## 2015-05-02 MED ORDER — SODIUM CHLORIDE 0.9 % IV SOLN: INTRAVENOUS | Status: DC

## 2015-05-02 MED ORDER — CHLORHEXIDINE GLUCONATE 4 % EX LIQD
60.0000 mL | Freq: Once | CUTANEOUS | Status: AC
  Administered 2015-05-02: 4 via TOPICAL
  Filled 2015-05-02: qty 60

## 2015-05-02 MED ORDER — CEFAZOLIN SODIUM-DEXTROSE 2-3 GM-% IV SOLR
INTRAVENOUS | Status: AC
  Filled 2015-05-02: qty 50

## 2015-05-02 MED ORDER — VANCOMYCIN HCL IN DEXTROSE 1-5 GM/200ML-% IV SOLN
1000.0000 mg | Freq: Two times a day (BID) | INTRAVENOUS | Status: AC
  Administered 2015-05-03: 1000 mg via INTRAVENOUS
  Filled 2015-05-02: qty 200

## 2015-05-02 MED ORDER — CHLORHEXIDINE GLUCONATE 4 % EX LIQD
60.0000 mL | Freq: Once | CUTANEOUS | Status: DC
  Filled 2015-05-02: qty 15

## 2015-05-02 MED ORDER — VANCOMYCIN HCL 1000 MG IV SOLR
1000.0000 mg | INTRAVENOUS | Status: DC | PRN
  Administered 2015-05-02: 1000 mg via INTRAVENOUS

## 2015-05-02 MED ORDER — SODIUM CHLORIDE 0.9 % IR SOLN: 80.0000 mg | Status: DC

## 2015-05-02 MED ORDER — ACETAMINOPHEN 325 MG PO TABS
325.0000 mg | ORAL_TABLET | ORAL | Status: DC | PRN
  Administered 2015-05-02: 650 mg via ORAL
  Filled 2015-05-02: qty 2

## 2015-05-02 MED ORDER — MIDAZOLAM HCL 5 MG/5ML IJ SOLN
INTRAMUSCULAR | Status: AC
  Filled 2015-05-02: qty 5

## 2015-05-02 MED ORDER — HEPARIN (PORCINE) IN NACL 2-0.9 UNIT/ML-% IJ SOLN
INTRAMUSCULAR | Status: AC
  Filled 2015-05-02: qty 500

## 2015-05-02 MED ORDER — ONDANSETRON HCL 4 MG/2ML IJ SOLN: 4.0000 mg | Freq: Four times a day (QID) | INTRAMUSCULAR | Status: DC | PRN

## 2015-05-02 MED ORDER — LIDOCAINE HCL (PF) 1 % IJ SOLN
INTRAMUSCULAR | Status: DC | PRN
  Administered 2015-05-02: 17:00:00

## 2015-05-02 SURGICAL SUPPLY — 19 items
CABLE SURGICAL S-101-97-12 (CABLE) ×3 IMPLANT
CATH ACUITYPRO 45CM EH 9F (CATHETERS) ×3 IMPLANT
CATH ACUITYPRO IC 90 7F (CATHETERS) ×3
CATH GD CS-IC 90 CVD 60X7FR (CATHETERS) IMPLANT
CATH HEX JOSEPH 2-5-2 65CM 6F (CATHETERS) ×3 IMPLANT
CRT-P PPM VALITUDE U128 (Pacemaker) ×3 IMPLANT
DEVICE CRT-P PPM VALITUDE U128 (Pacemaker) ×1 IMPLANT
GUIDEWIRE ANGLED .035X150CM (WIRE) ×3 IMPLANT
INGEVITY MRI 7741-52CM (Lead) ×3 IMPLANT
INGEVITY MRI 7742-59CM (Lead) ×3 IMPLANT
LEAD ACUITY X4 4674 (Lead) ×3 IMPLANT
LEAD PACING INGEVITY MRI 52CM (Lead) ×1 IMPLANT
LEAD PACING INGEVITY MRI 59CM (Lead) ×1 IMPLANT
PAD DEFIB LIFELINK (PAD) ×3 IMPLANT
SHEATH CLASSIC 7F (SHEATH) ×6 IMPLANT
SHEATH CLASSIC 9F (SHEATH) ×3 IMPLANT
TRAY PACEMAKER INSERTION (PACKS) ×2 IMPLANT
WIRE ACUITY WHISPER EDS 4648 (WIRE) ×2 IMPLANT
WIRE MAILMAN 182CM (WIRE) ×2 IMPLANT

## 2015-05-02 NOTE — Clinical Documentation Improvement (Signed)
Cardiology  Can the diagnosis of Atrial Flutter be further specified? Thank you   Atypical (Type II)  Typical (Type I)  Other  Clinically Undetermined  Document any associated diagnoses/conditions.   Supporting Information: Continue home medication    Please exercise your independent, professional judgment when responding. A specific answer is not anticipated or expected.   Thank You,  [place] [phone]

## 2015-05-02 NOTE — Progress Notes (Signed)
Orthopedic Tech Progress Note Patient Details:  Ernest Lara 1940/08/13 [id number] Patient already has arm sling. Patient ID: Ernest Lara, male   DOB: Feb 07, 1941, 74 y.o.   MRN: [id number]   Braulio Bosch 05/02/2015, 8:24 PM

## 2015-05-02 NOTE — Progress Notes (Signed)
Patient ID: Ernest Lara, male   DOB: 22-Mar-1941, 74 y.o.   MRN: [id number] EP Attending  Asking to return to check on patient. He is clinically stable and has persistent complete heart block. His coreg has washed out (over 5 half lives since last dose) and his heart block persists. I have discussed the risks/benefits/goals/expectations of PPM insertion (BiV with known LV dysfunction) with the patient and his adult children and they wish to proceed.   Mikle Bosworth.D.

## 2015-05-02 NOTE — Progress Notes (Signed)
SUBJECTIVE: The patient is doing well this morning.  At this time, he denies chest pain, shortness of breath, or any new concerns.  No dizziness, near syncope or syncope.  . benazepril  40 mg Oral Daily  . doxazosin  8 mg Oral QHS  . insulin aspart  0-9 Units Subcutaneous TID WC  . potassium chloride  20 mEq Oral Daily  . rosuvastatin  20 mg Oral QHS  . sodium chloride  3 mL Intravenous Q12H      OBJECTIVE: Physical Exam: Filed Vitals:   05/02/15 0200 05/02/15 0344 05/02/15 0400 05/02/15 0710  BP: 118/54 118/54 115/47 140/58  Pulse: 29 32 29 32  Temp:  98.3 F (36.8 C)  98.1 F (36.7 C)  TempSrc:  Oral  Oral  Resp: 12 18 10 16   Height:      Weight:      SpO2: 100% 99% 99% 99%    Intake/Output Summary (Last 24 hours) at 05/02/15 0744 Last data filed at 05/02/15 0600  Gross per 24 hour  Intake    200 ml  Output    750 ml  Net   -550 ml    Telemetry reveals CHB, HR 30's  GEN- The patient is obese, in NAD, alert and oriented x 3 today.   Head- normocephalic, atraumatic Eyes-  Sclera clear, conjunctiva pink Ears- hearing intact Neck- supple, no JVP Lungs- Clear to ausculation bilaterally, normal work of breathing Heart- Regular rate and rhythm, bradycardic, no significant murmurs, no rubs or gallops GI- soft, NT, ND, + BS Extremities- no clubbing, cyanosis, or edema Skin- no rash or lesion Psych- euthymic mood, full affect Neuro- no gross deficits appreciated  LABS: Basic Metabolic Panel:  Recent Labs  05/01/15 1832 05/02/15 0413  NA 140 140  K 4.4 4.4  CL 106 106  CO2 25 26  GLUCOSE 159* 170*  BUN 29* 31*  CREATININE 1.88* 1.97*  CALCIUM 10.1 9.7   Liver Function Tests:  Recent Labs  05/01/15 1832  AST 19  ALT 16*  ALKPHOS 58  BILITOT 0.7  PROT 7.3  ALBUMIN 3.6     Recent Labs  05/01/15 1832  WBC 8.0  NEUTROABS 5.4  HGB 11.1*  HCT 33.5*  MCV 78.1  PLT 229    Recent Labs  05/01/15 1832  TSH 2.845    ASSESSMENT AND  PLAN:  1. CHB     Persists this morning, off his coreg (last dose 05/01/15 AM)     BP appears stable     Continue ICU, zoll pads on patient/external pacer at bedside     bedrest     Plan for CRT pacer tomorrow if heart block persists  2. Acute on chronic renal insufficiency- likely due to bradycardia     Hold lasix today  3. CHF, systolic     Appears compensated at this time     Echo is pending  4. PAFib/flutter     Hold Xarelto for likely pacer tomorrow  5. DM  Tommye Standard, PA-C 05/02/2015 7:44 AM  I have seen, examined the patient, and reviewed the above assessment and plan.  On exam, well appearing.  Bradycardic rhythm.  Changes to above are made where necessary.  I would anticipate that he will require PPM implantation after coreg washout tomorrow.  Will repeat echo.  If EF is depressed, may benefit from LV lead.  I will defer this decision to Dr Caryl Comes.  Risks, benefits, alternatives to pacemaker (either dual chamber  or BiV) implantation were discussed in detail with the patient today. The patient understands that the risks include but are not limited to bleeding, infection, pneumothorax, perforation, tamponade, vascular damage, renal failure, MI, stroke, death,  and lead dislodgement and wishes to proceed.  I have tentatively placed on the schedule for tomorrow with Dr Caryl Comes.  Xarelto has been held in anticipation of the procedure.   Co Sign: Thompson Grayer, MD 05/02/2015 11:25 AM

## 2015-05-02 NOTE — Care Management Note (Signed)
Case Management Note  Patient Details  Name: Ernest Lara MRN: [id number] Date of Birth: Mar 04, 1941  Subjective/Objective:     Adm w complete heart block              Action/Plan: lives w wife, pcp dr Carlis Abbott  Expected Discharge Date:                  Expected Discharge Plan:     In-House Referral:     Discharge planning Services     Post Acute Care Choice:    Choice offered to:     DME Arranged:    DME Agency:     HH Arranged:    [place] Agency:     Status of Service:     Medicare Important Message Given:    Date Medicare IM Given:    Medicare IM give by:    Date Additional Medicare IM Given:    Additional Medicare Important Message give by:     If discussed at [place] of Stay Meetings, dates discussed:    Additional Comments: ur review done  Lacretia Leigh, RN 05/02/2015, 7:47 AM

## 2015-05-02 NOTE — H&P (Signed)
CC: Complete Heart Block  HPI: Ernest Lara is a 74 y.o. male was referred to [facility] from the office today noting CHB with rates in the 30's. He had called to the office c/o feeling weak, he went into the office to get his BP and HR checked, where he was found bradycardic, in CHB and sent to the hospital for direct admit with plans to implant CRT pacer once his betablocker is washed out.  His PMHx includes, HTN, DM, AFib and flutter on Xarelto, CRI, and CHF (systolic) felt secondary to tachycardia. He has history of pulmonary toxicity with amiodarone.  The patient tells me that he was feeling well, doing his usual walks 30-70minutes 3x week as usual, the last was Friday, he was in [place] for a football game Saturday and felt well, yesterday he woke feeling extremely tired, weak, had no energy to do anything, He denies feeling lightheaded or dizzy, just no energy and very unusual for him. No CP, palpitations, no SOB. No near syncope or syncope. Today he again felt the same way and called to his cardiologist office, there he was noted to be in CHB and sent to the hospital. He denies any unusual or new medicines. He took his last dose of Carvedilol this morning (12.5mg ). He reports he takes 12.5mg  ini the morning and 25mg  at night which has been his routine for years.  Past Medical History  Diagnosis Date  . Hypertension   . Diabetes mellitus   . DM (diabetes mellitus), type 2  10/08/2011  . HTN (hypertension) 10/08/2011  . 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 afib 10/08/2011  . CHF (congestive heart failure) (HCC)     mild   . OSA (obstructive sleep apnea), pt with apnea during procedures 10/11/2011  . CKD (chronic kidney disease) stage 3, GFR 30-59 ml/min 10/13/2011  . Systolic dysfunction      Surgical History:  Past Surgical History  Procedure Laterality Date  . No past surgeries    . Tee without cardioversion   10/10/2011    Procedure: TRANSESOPHAGEAL ECHOCARDIOGRAM (TEE); Surgeon: Sanda Klein, MD; Location: [place]; Service: Cardiovascular; Laterality: N/A;  . Cardioversion  10/11/2011    Procedure: CARDIOVERSION; Surgeon: Leonie Man, MD; Location: [place]; Service: Cardiovascular; Laterality: N/A;  . US echocardiography  06/09/2012    EF 40-45%,mild concentric hypertrophy  . Nuclear stress  11/01/2011    severe global hypokinesis,dilated ventricle     Prescriptions prior to admission  Medication Sig Dispense Refill Last Dose  . benazepril (LOTENSIN) 40 MG tablet Take 40 mg by mouth daily.   Taking  . carvedilol (COREG) 25 MG tablet TAKE A HALF TABLET (12.5 MG) EVERY MORNING AND A WHOLE TABLET (25 MG) EVERY EVENING 135 tablet 3   . doxazosin (CARDURA) 8 MG tablet Take 1 tablet (8 mg total) by mouth at bedtime. 30 tablet 11   . furosemide (LASIX) 80 MG tablet TAKE 1 TABLET (80 MG TOTAL) BY MOUTH DAILY. 60 tablet 5   . glyBURIDE-metformin (GLUCOVANCE) 5-500 MG per tablet Take 1 tablet by mouth 2 (two) times daily with a meal.   Taking  . KLOR-CON M10 10 MEQ tablet TAKE 2 TABLETS (20 MEQ TOTAL) BY MOUTH DAILY. 60 tablet 5   . rosuvastatin (CRESTOR) 20 MG tablet Take 20 mg by mouth at bedtime.   Taking  . sitaGLIPtin (JANUVIA) 100 MG tablet Take 100 mg by mouth daily.   Taking  . XARELTO 20 MG TABS  tablet TAKE 1 TABLET (20 MG TOTAL) BY MOUTH DAILY WITH SUPPER. 30 tablet 5     Inpatient Medications:   Allergies:  Allergies  Allergen Reactions  . Amiodarone Shortness Of Breath    Pulmonary toxicity with amiodarone    Social History   Social History  . Marital Status: Married    Spouse Name: N/A  . Number of Children: N/A  . Years of Education: N/A   Occupational History  . Not on file.   Social History Main Topics  . Smoking status: Never Smoker    . Smokeless tobacco: Never Used  . Alcohol Use: No  . Drug Use: No  . Sexual Activity: Not Currently   Other Topics Concern  . Not on file   Social History Narrative     Family History  Problem Relation Age of Onset  . Cancer Mother     Died of colon cancer young  . Kidney disease Father     ESRF/HD, deceased     Review of Systems: All other systems reviewed and are otherwise negative except as noted above.  Physical Exam: Filed Vitals:   05/01/15 1538  BP: 151/60  Pulse: 33  TempSrc: Oral  Resp: 20  Height: 6\' 3"  (1.905 m)  Weight: 258 lb 13.1 oz (117.4 kg)  SpO2: 98%    GEN- The patient is well appearing, alert and oriented x 3 today.  HEENT: normocephalic, atraumatic; sclera clear, conjunctiva pink; hearing intact; oropharynx clear; neck supple, no JVP Lymph- no cervical lymphadenopathy Lungs- Clear to ausculation bilaterally, normal work of breathing. No wheezes, rales, rhonchi Heart- Regular rate and rhythm, bradycardic, no murmurs, rubs or gallops, PMI not laterally displaced GI- soft, non-tender, non-distended Extremities- no clubbing, cyanosis, or edema MS- no significant deformity or atrophy Skin- warm and dry, no rash or lesion Psych- euthymic mood, full affect Neuro- no gross deficits observed  Labs:   Recent Labs    Lab Results  Component Value Date   WBC 6.2 09/11/2013   HGB 12.2* 09/11/2013   HCT 36.0* 09/11/2013   MCV 76.9* 09/11/2013   PLT 235 09/11/2013     06/09/12: Echocardiogram  Study Conclusions - Left ventricle: The cavity size was normal. There was mild concentric hypertrophy. Systolic function was mildly to moderately reduced. The estimated ejection fraction was in the range of 40% to 45%. Diffuse hypokinesis. Features are consistent with a pseudonormal left ventricular filling pattern, with concomitant abnormal relaxation  and increased filling pressure (grade 2 diastolic dysfunction). Doppler parameters are consistent with elevated mean left atrial filling pressure. - Ventricular septum: Septal motion showed paradox. - Mitral valve: Valve area by pressure half-time: 2.22cm^2. - Left atrium: The atrium was mildly dilated. - Atrial septum: No defect or patent foramen ovale was identified.  EKG: CHB, 33bpm, IVCD EKG done at the office was reviewed by Dr. Sallyanne Kuster and was CHB TELEMETRY: CHB, 32bpm currently   Assessment and Plan:  1. Symptomatic bradycardia  Admit ICU bed  CHB  Chronically on coreg, will need to allow washout period  Zoll pads are on patient  BP stable  Bedrest  Check an echo, plan for CRT pacer if bradycardia/heart block does not resolve off betablocker  2. HTN  Appears stable, NO NODAL BLOCKING AGENTS for BP control 3. DM  Will ask pharmacy to aid in DM management 4. PAFib/flutter  CHADS2Vasc is at least 3 on Xarelto  The patient requests a sleep aid, will order Trazodone as per Dr. Curt Bears.  Venetia Night, PA-C 05/01/2015 4:08 PM   I have seen and examined this patient with Tommye Standard on 05/01/15. Agree with above, note added to reflect my findings. On exam, bradycardic rhythm, no murmurs, lungs clear. Patient without complaints other than fatigue. Found to be in heart block on ECG at the office. Stable currently. On beta blockers, will hold and plan for possible pacemaker Wednesday.     EP Attending  Patient seen and examined. Agree with above. The patient has washed out his coreg and still has CHB. His echo today in the presence of CHB and maximal adrenal activation from complete heart block shows normal LV function but previously he has had an EF of 40%. Will plan on placing an LV lead as well.   Mikle Bosworth.D.

## 2015-05-02 NOTE — Progress Notes (Signed)
  Echocardiogram 2D Echocardiogram has been performed.  Ernest Lara 05/02/2015, 1:23 PM

## 2015-05-03 ENCOUNTER — Other Ambulatory Visit: Payer: Self-pay | Admitting: Physician Assistant

## 2015-05-03 ENCOUNTER — Encounter (HOSPITAL_COMMUNITY): Payer: Self-pay | Admitting: Physician Assistant

## 2015-05-03 ENCOUNTER — Inpatient Hospital Stay (HOSPITAL_COMMUNITY): Payer: Medicare Other

## 2015-05-03 DIAGNOSIS — N289 Disorder of kidney and ureter, unspecified: Secondary | ICD-10-CM

## 2015-05-03 LAB — BASIC METABOLIC PANEL
ANION GAP: 8 (ref 5–15)
BUN: 30 mg/dL — ABNORMAL HIGH (ref 6–20)
CALCIUM: 9.8 mg/dL (ref 8.9–10.3)
CO2: 26 mmol/L (ref 22–32)
Chloride: 108 mmol/L (ref 101–111)
Creatinine, Ser: 1.81 mg/dL — ABNORMAL HIGH (ref 0.61–1.24)
GFR, EST AFRICAN AMERICAN: 41 mL/min — AB (ref >60)
GFR, EST NON AFRICAN AMERICAN: 35 mL/min — AB (ref >60)
Glucose, Bld: 140 mg/dL — ABNORMAL HIGH (ref 65–99)
Potassium: 4.5 mmol/L (ref 3.5–5.1)
SODIUM: 142 mmol/L (ref 135–145)

## 2015-05-03 LAB — CBC
HCT: 31.5 % — ABNORMAL LOW (ref 39.0–52.0)
HEMOGLOBIN: 10.5 g/dL — AB (ref 13.0–17.0)
MCH: 26.1 pg (ref 26.0–34.0)
MCHC: 33.3 g/dL (ref 30.0–36.0)
MCV: 78.2 fL (ref 78.0–100.0)
Platelets: 198 10*3/uL (ref 150–400)
RBC: 4.03 MIL/uL — AB (ref 4.22–5.81)
RDW: 14.6 % (ref 11.5–15.5)
WBC: 6.2 10*3/uL (ref 4.0–10.5)

## 2015-05-03 LAB — GLUCOSE, CAPILLARY
GLUCOSE-CAPILLARY: 187 mg/dL — AB (ref 65–99)
Glucose-Capillary: 180 mg/dL — ABNORMAL HIGH (ref 65–99)

## 2015-05-03 LAB — HEMOGLOBIN A1C
HEMOGLOBIN A1C: 7.8 % — AB (ref 4.8–5.6)
MEAN PLASMA GLUCOSE: 177 mg/dL

## 2015-05-03 MED ORDER — INSULIN ASPART 100 UNIT/ML ~~LOC~~ SOLN: 0.0000 [IU] | SUBCUTANEOUS | Status: DC

## 2015-05-03 MED ORDER — INSULIN ASPART 100 UNIT/ML ~~LOC~~ SOLN: 0.0000 [IU] | Freq: Every day | SUBCUTANEOUS | Status: DC

## 2015-05-03 MED ORDER — INSULIN ASPART 100 UNIT/ML ~~LOC~~ SOLN: 0.0000 [IU] | Freq: Three times a day (TID) | SUBCUTANEOUS | Status: DC

## 2015-05-03 MED ORDER — BENAZEPRIL HCL 20 MG PO TABS: 20.0000 mg | ORAL_TABLET | Freq: Every day | ORAL | Status: DC

## 2015-05-03 MED ORDER — FUROSEMIDE 40 MG PO TABS: 40.0000 mg | ORAL_TABLET | Freq: Every day | ORAL | Status: DC

## 2015-05-03 MED ORDER — INSULIN ASPART 100 UNIT/ML ~~LOC~~ SOLN
0.0000 [IU] | Freq: Three times a day (TID) | SUBCUTANEOUS | Status: DC
Start: 2015-05-03 — End: 2015-05-03
  Administered 2015-05-03: 3 [IU] via SUBCUTANEOUS

## 2015-05-03 MED ORDER — POTASSIUM CHLORIDE CRYS ER 10 MEQ PO TBCR: 10.0000 meq | EXTENDED_RELEASE_TABLET | Freq: Once | ORAL | Status: DC

## 2015-05-03 MED ORDER — ROSUVASTATIN CALCIUM 20 MG PO TABS: 20.0000 mg | ORAL_TABLET | Freq: Every day | ORAL | Status: DC

## 2015-05-03 MED ORDER — FUROSEMIDE 80 MG PO TABS: 40.0000 mg | ORAL_TABLET | Freq: Every day | ORAL | Status: DC

## 2015-05-03 MED FILL — Heparin Sodium (Porcine) 2 Unit/ML in Sodium Chloride 0.9%: INTRAMUSCULAR | Qty: 500 | Status: AC

## 2015-05-03 MED FILL — Lidocaine HCl Local Preservative Free (PF) Inj 1%: INTRAMUSCULAR | Qty: 30 | Status: AC

## 2015-05-03 MED FILL — Sodium Chloride Irrigation Soln 0.9%: Qty: 500 | Status: AC

## 2015-05-03 MED FILL — Gentamicin Sulfate Inj 40 MG/ML: INTRAMUSCULAR | Qty: 2 | Status: AC

## 2015-05-03 NOTE — Discharge Instructions (Signed)
° ° °  Supplemental Discharge Instructions for  Pacemaker/Defibrillator Patients  Activity No heavy lifting or vigorous activity with your left/right arm for 6 to 8 weeks.  Do not raise your left/right arm above your head for one week.  Gradually raise your affected arm as drawn below.          05/07/15                    05/08/15                  05/09/15                 05/10/15 __  NO DRIVING for     1 week; you may begin driving on   [id number]  .  WOUND CARE - Keep the wound area clean and dry.  Do not get this area wet for one week. No showers for one week; you may shower on   04/30/15  . - The tape/steri-strips on your wound will fall off; do not pull them off.  No bandage is needed on the site.  DO  NOT apply any creams, oils, or ointments to the wound area. - If you notice any drainage or discharge from the wound, any swelling or bruising at the site, or you develop a fever > 101? F after you are discharged home, call the office at once.  Special Instructions - You are still able to use cellular telephones; use the ear opposite the side where you have your pacemaker/defibrillator.  Avoid carrying your cellular phone near your device. - When traveling through airports, show security personnel your identification card to avoid being screened in the metal detectors.  Ask the security personnel to use the hand wand. - Avoid arc welding equipment, MRI testing (magnetic resonance imaging), TENS units (transcutaneous nerve stimulators).  Call the office for questions about other devices. - Avoid electrical appliances that are in poor condition or are not properly grounded. - Microwave ovens are safe to be near or to operate.  Additional information for defibrillator patients should your device go off: - If your device goes off ONCE and you feel fine afterward, notify the device clinic nurses. - If your device goes off ONCE and you do not feel well afterward, call 911. - If your device goes  off TWICE, call 911. - If your device goes off THREE times in one day, call 911.  DO NOT DRIVE YOURSELF OR A FAMILY MEMBER WITH A DEFIBRILLATOR TO THE HOSPITAL--CALL 911.

## 2015-05-03 NOTE — Progress Notes (Signed)
Inpatient Diabetes Program Recommendations  AACE/ADA: New Consensus Statement on Inpatient Glycemic Control (2015)  Target Ranges:  Prepandial:   less than 140 mg/dL      Peak postprandial:   less than 180 mg/dL (1-2 hours)      Critically ill patients:  140 - 180 mg/dL  Results for BRACY, MCCLELLAND (MRN [id number]) as of 05/03/2015 08:36  Ref. Range 05/02/2015 07:07 05/02/2015 11:43 05/02/2015 18:24 05/02/2015 21:12 05/03/2015 07:24  Glucose-Capillary Latest Ref Range: 65-99 mg/dL 184 (H) 240 (H) 171 (H) 237 (H) 180 (H)   Review of Glycemic Control  Diabetes history: DM2 Outpatient Diabetes medications: Metformin XR 750 mg BID Current orders for Inpatient glycemic control: Novolog 0-9 units TID with meals  Inpatient Diabetes Program Recommendations: Correction (SSI): Please consider increasing Novolog correction to moderate scale and adding Novolog bedtime correction scale.  Thanks, Ernest Alderman, Ernest Lara, Ernest Lara, Ernest Lara Diabetes Coordinator Inpatient Diabetes Program [phone] (Team Pager from [place] to [place]) [phone] ([facility] office) [phone] [facility] office) [phone] [facility] office)

## 2015-05-03 NOTE — Discharge Summary (Addendum)
ELECTROPHYSIOLOGY PROCEDURE DISCHARGE SUMMARY    Patient ID: Ernest Lara,  MRN: [id number], DOB/AGE: May 17, 1941 74 y.o.  Admit date: 05/01/2015 Discharge date: 05/03/2015  Primary Care Physician: Ernest Spurling, MD Primary Cardiologist: Dr. Sallyanne Kuster Electrophysiologist: Dr. Lovena Le  Primary Discharge Diagnosis:  Symptomatic bradycardia, CHB,  status post pacemaker implantation this admission  Secondary Discharge Diagnosis:  1. HTN 2. PAFib/Flutter     CHADs2Vasc = 3 on Xarelto 3. DM 4. Acute on chronic renal insufficiency 5. Acute on chronic systolic heart failure exacerbated by CHB with prior EF 40-45%.  Allergies  Allergen Reactions  . Amiodarone Shortness Of Breath    Pulmonary toxicity with amiodarone     Procedures This Admission:  1.  Implantation of a [employer] CRT PPM on 05/02/15 by [name].  The patient received a [employer] (serial number [id number]) BiV pacemaker [employer] model [phone] (serial number [id number]) right atrial lead and a Wright [phone] (serial number [id number]) right ventricular lead and aBoston Sci(serial number [id number]) LV lead.  There were no immediate post procedure complications. 2.  CXR on 05/03/15 demonstrated no pneumothorax status post device implantation.   Brief HPI: Ernest Lara is a 74 y.o. male was referred to [facility] from his cardiologist's office for symptomatic bradycardia, noting him to be in CHB.  Past medical history includes HTN, DM, systolic CHF, PAFib/flutter.  The patient has had symptomatic bradycardia with CHB in the 30's bpm, and worsening if his CRI, his Coreg was held, and yesterday underwent PPM implantation.  Risks, benefits, and alternatives to PPM implantation were reviewed with the patient who wished to proceed.   Hospital Course:  The patient was admitted 05/02/15, to the ICU, his coreg was discontinues and observed in the ICU during the washout period.  He had no CP or SOB, but extreme fatigue, noting  a clear change in his energy from his baseline.  He had some mild dizziness, but no near syncope or syncope.  He remained with stable BP, his renal function monitored, with an acute worsening felt to be secondary to the bradycaria/CHB. He underwent implantation of a Boston Sci BiV PPM with details as outlined above.  He  was monitored on telemetry overnight which demonstrated NSR with BiV pacing.  Left chest was without hematoma or ecchymosis.  The device was interrogated and found to be functioning normally.  CXR was obtained and demonstrated no pneumothorax status post device implantation.  Wound care, arm mobility, and restrictions were reviewed with the patient.  Given his renal function, we will decrease his lasix to 40mg  daily (and his potassium to 11meq daily), and his benazapril to 20mg  daily, he will resume his coreg and home medicines otherwise. The patient was examined by Dr. Lovena Le and considered stable for discharge to home. His BiV PPM was interogated on the following day and was found to be working normally. The patient has ambulated in his room, feels well, and without difficulty or symptoms.   Physical Exam: Filed Vitals:   05/03/15 0700 05/03/15 0724 05/03/15 0800 05/03/15 1158  BP: 118/64  160/86 148/77  Pulse: 55  66 61  Temp:  97.4 F (36.3 C)  97.9 F (36.6 C)  TempSrc:  Oral  Oral  Resp: 12  19 13   Height:      Weight:      SpO2: 97%  99% 99%    GEN- The patient is well appearing, alert and oriented x 3 today.   HEENT: normocephalic,  atraumatic; sclera clear, conjunctiva pink; hearing intact; oropharynx clear; neck supple, no JVP Lungs- Clear to ausculation bilaterally, normal work of breathing.  No wheezes, rales, rhonchi. No hematoma over PPM incision. Heart- Regular rate and rhythm, no murmurs, rubs or gallops, PMI not laterally displaced GI- soft, non-tender, non-distended, bowel sounds present, no hepatosplenomegaly Extremities- no clubbing, cyanosis, or edema;  DP/PT/radial pulses 2+ bilaterally MS- no significant deformity or atrophy Skin- warm and dry, no rash or lesion, left chest without hematoma/ecchymosis Psych- euthymic mood, full affect Neuro- no gross deficits   Labs:   Lab Results  Component Value Date   WBC 6.2 05/03/2015   HGB 10.5* 05/03/2015   HCT 31.5* 05/03/2015   MCV 78.2 05/03/2015   PLT 198 05/03/2015    Recent Labs Lab 05/01/15 1832  05/03/15 0319  NA 140  < > 142  K 4.4  < > 4.5  CL 106  < > 108  CO2 25  < > 26  BUN 29*  < > 30*  CREATININE 1.88*  < > 1.81*  CALCIUM 10.1  < > 9.8  PROT 7.3  --   --   BILITOT 0.7  --   --   ALKPHOS 58  --   --   ALT 16*  --   --   AST 19  --   --   GLUCOSE 159*  < > 140*  < > = values in this interval not displayed.  05/03/15: CXR IMPRESSION: No evidence for pneumothorax after permanent pacemaker placement.  Discharge Medications:    Medication List    TAKE these medications        benazepril 20 MG tablet  Commonly known as:  LOTENSIN  Take 1 tablet (20 mg total) by mouth daily.     carvedilol 25 MG tablet  Commonly known as:  COREG  TAKE A HALF TABLET (12.5 MG) EVERY MORNING AND A WHOLE TABLET (25 MG) EVERY EVENING     doxazosin 8 MG tablet  Commonly known as:  CARDURA  Take 1 tablet (8 mg total) by mouth at bedtime.     furosemide 80 MG tablet  Commonly known as:  LASIX  Take 0.5 tablets (40 mg total) by mouth daily.     metFORMIN 750 MG 24 hr tablet  Commonly known as:  GLUCOPHAGE-XR  Take 750 mg by mouth 2 (two) times daily.  Notes to Patient:  Resume 48 hours after pacemaker implant     naproxen sodium 220 MG tablet  Commonly known as:  ANAPROX  Take 220 mg by mouth as needed (moderate pain).     potassium chloride 10 MEQ tablet  Commonly known as:  KLOR-CON M10  Take 1 tablet (10 mEq total) by mouth once.     rosuvastatin 20 MG tablet  Commonly known as:  CRESTOR  Take 1 tablet (20 mg total) by mouth at bedtime.     sitaGLIPtin 100 MG  tablet  Commonly known as:  JANUVIA  Take 100 mg by mouth daily.     XARELTO 20 MG Tabs tablet  Generic drug:  rivaroxaban  TAKE 1 TABLET (20 MG TOTAL) BY MOUTH DAILY WITH SUPPER.        Disposition:  Discharge Instructions    Diet - low sodium heart healthy    Complete by:  As directed      Increase activity slowly    Complete by:  As directed           Follow-up  Information    Follow up with [facility] On 05/15/2015.   Specialty:  Cardiology   Why:  10:30 wound check   Contact information:   [address] [place] [phone]      Follow up with Cristopher Peru, MD On 08/02/2015.   Specialty:  Cardiology   Why:  11:45AM   Contact information:   [address] [place] [address] [phone]       Follow up with [facility] On 05/15/2015.   Specialty:  Cardiology   Why:  lab draw during wound check visit   Contact information:   [address] [place] [phone]      Duration of Discharge Encounter: Greater than 30 minutes including physician time.  Venetia Night, PA-C 05/03/2015 1:12 PM   EP Attending  Patient seen and examined. Agree with the findings as noted above. The patient is doing well after BiV PPM insertion. Will plan to discharge home later today with usual followup.  Mikle Bosworth.D.

## 2015-05-08 ENCOUNTER — Encounter: Payer: Self-pay | Admitting: Cardiovascular Disease

## 2015-05-08 ENCOUNTER — Ambulatory Visit (INDEPENDENT_AMBULATORY_CARE_PROVIDER_SITE_OTHER): Payer: Medicare Other | Admitting: Cardiovascular Disease

## 2015-05-08 VITALS — BP 140/70 | HR 70 | Resp 16 | Ht 75.0 in | Wt 263.9 lb

## 2015-05-08 DIAGNOSIS — I442 Atrioventricular block, complete: Secondary | ICD-10-CM | POA: Diagnosis not present

## 2015-05-08 DIAGNOSIS — N289 Disorder of kidney and ureter, unspecified: Secondary | ICD-10-CM

## 2015-05-08 DIAGNOSIS — Z79899 Other long term (current) drug therapy: Secondary | ICD-10-CM | POA: Diagnosis not present

## 2015-05-08 DIAGNOSIS — N183 Chronic kidney disease, stage 3 unspecified: Secondary | ICD-10-CM

## 2015-05-08 DIAGNOSIS — I480 Paroxysmal atrial fibrillation: Secondary | ICD-10-CM

## 2015-05-08 DIAGNOSIS — N189 Chronic kidney disease, unspecified: Secondary | ICD-10-CM

## 2015-05-08 DIAGNOSIS — I10 Essential (primary) hypertension: Secondary | ICD-10-CM | POA: Diagnosis not present

## 2015-05-08 DIAGNOSIS — I5021 Acute systolic (congestive) heart failure: Secondary | ICD-10-CM | POA: Diagnosis not present

## 2015-05-08 LAB — BASIC METABOLIC PANEL
BUN: 21 mg/dL (ref 7–25)
CALCIUM: 9.2 mg/dL (ref 8.6–10.3)
CHLORIDE: 105 mmol/L (ref 98–110)
CO2: 23 mmol/L (ref 20–31)
CREATININE: 1.43 mg/dL — AB (ref 0.70–1.18)
GLUCOSE: 176 mg/dL — AB (ref 65–99)
Potassium: 4.5 mmol/L (ref 3.5–5.3)
Sodium: 138 mmol/L (ref 135–146)

## 2015-05-08 MED ORDER — POTASSIUM CHLORIDE CRYS ER 10 MEQ PO TBCR: 20.0000 meq | EXTENDED_RELEASE_TABLET | Freq: Once | ORAL | Status: DC

## 2015-05-08 MED ORDER — FUROSEMIDE 80 MG PO TABS: 80.0000 mg | ORAL_TABLET | Freq: Every day | ORAL | Status: DC

## 2015-05-08 MED ORDER — BENAZEPRIL HCL 40 MG PO TABS: 40.0000 mg | ORAL_TABLET | Freq: Every day | ORAL | Status: DC

## 2015-05-08 NOTE — Patient Instructions (Signed)
Your physician has recommended you make the following change in your medication:   INCREASE BENAZEPRIL TO 40 MG DAILY  INCREASE POTASSIUM TO 20 MEQ DAILY  INCREASE FUROSEMIDE TO 26 MG DAILY  Your physician recommends that you return for lab work in: [place] [facility]  [place] DR. Lovena Le 08/02/15 AT [place] OFFICE.  Dr. Sallyanne Kuster recommends that you schedule a follow-up appointment in: MAY 2017 WITH A DEVICE CHECK ([employer]) AT [place].

## 2015-05-08 NOTE — Progress Notes (Signed)
Patient ID: Ernest Lara, male   DOB: 12-10-1940, 74 y.o.   MRN: [id number]      Cardiology Office Note   Date:  05/08/2015   ID:  Ernest Lara, DOB 1941/06/10, MRN [id number]  PCP:  [name], MD  Cardiologist:  Cristopher Peru, MD;  Sanda Klein, MD   Chief Complaint  Patient presents with  . Follow-up    pacer implant 11/22//pt states no Sx.      History of Present Illness: Ernest Lara is a 74 y.o. male who presents for follow-up for complete heart block with recent implantation of a [employer] CRT-P device by Dr. Lovena Le on November 22.  From a symptom point of view he is markedly improved since the device implantation.  Due to renal insufficiency the doses of ACE inhibitor, diuretic and potassium were all reduced during his hospitalization.  He has noted a roughly 15 pound increase in weight compared to 2 weeks ago, about 10 pounds since I saw him in office on presentation with complete AV block.  He denies exertional dyspnea or orthopnea and does not have edema.   The surgical site is healing well.  There is a small ecchymosis but no hematoma.  He has not had fever or chills.  Interrogation of the device today shows excellent lead parameters.  Left ventricular pacing threshold was 1.0 V at 0.4 ms in the configuration LV ring 2-ring 3.  There is 98% biventricular pacing.  There is no atrial pacing.  No significant atrial or ventricular high rates have been recorded.   Past Medical History  Diagnosis Date  . Hypertension   . Diabetes mellitus   . DM (diabetes mellitus), type 2  10/08/2011  . HTN (hypertension) 10/08/2011  . 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 afib 10/08/2011  . CHF (congestive heart failure) (HCC)     mild   . OSA (obstructive sleep apnea), pt with apnea during procedures 10/11/2011  . CKD (chronic kidney disease) stage 3, GFR 30-59 ml/min 10/13/2011  . Systolic dysfunction   . Complete heart block [facility])    [employer] PPM, Dr. Lovena Le 05/02/15    Past Surgical History  Procedure Laterality Date  . No past surgeries    . Tee without cardioversion  10/10/2011    Procedure: TRANSESOPHAGEAL ECHOCARDIOGRAM (TEE);  Surgeon: Sanda Klein, MD;  Location: [place];  Service: Cardiovascular;  Laterality: N/A;  . Cardioversion  10/11/2011    Procedure: CARDIOVERSION;  Surgeon: Leonie Man, MD;  Location: [place];  Service: Cardiovascular;  Laterality: N/A;  . US echocardiography  06/09/2012    EF 40-45%,mild concentric hypertrophy  . Nuclear stress  11/01/2011    severe global hypokinesis,dilated ventricle  . Ep implantable device N/A 05/02/2015    Procedure: BiV Pacemaker Insertion CRT-P;  Surgeon: Evans Lance, MD;  Location: [place] [facility];  Service: Cardiovascular;  Laterality: N/A;     Current Outpatient Prescriptions  Medication Sig Dispense Refill  . benazepril (LOTENSIN) 40 MG tablet Take 1 tablet (40 mg total) by mouth daily. 30 tablet 2  . carvedilol (COREG) 25 MG tablet TAKE A HALF TABLET (12.5 MG) EVERY MORNING AND A WHOLE TABLET (25 MG) EVERY EVENING 135 tablet 3  . doxazosin (CARDURA) 8 MG tablet Take 1 tablet (8 mg total) by mouth at bedtime. 30 tablet 11  . furosemide (LASIX) 80 MG tablet Take 1 tablet (80 mg total) by mouth daily. 30 tablet 3  . metFORMIN (  GLUCOPHAGE-XR) 750 MG 24 hr tablet Take 750 mg by mouth 2 (two) times daily.  2  . naproxen sodium (ANAPROX) 220 MG tablet Take 220 mg by mouth as needed (moderate pain).    . potassium chloride (KLOR-CON M10) 10 MEQ tablet Take 2 tablets (20 mEq total) by mouth once. 60 tablet 3  . rosuvastatin (CRESTOR) 20 MG tablet Take 1 tablet (20 mg total) by mouth at bedtime. 30 tablet 3  . sitaGLIPtin (JANUVIA) 100 MG tablet Take 100 mg by mouth daily.    Alveda Reasons 20 MG TABS tablet TAKE 1 TABLET (20 MG TOTAL) BY MOUTH DAILY WITH SUPPER. 30 tablet 5   No current facility-administered medications for this visit.     Allergies:   Amiodarone    Social History:  The patient  reports that he has never smoked. He has never used smokeless tobacco. He reports that he does not drink alcohol or use illicit drugs.   Family History:  The patient's  family history includes Cancer in his mother; Kidney disease in his father.    ROS:  Please see the history of present illness.    Otherwise, review of systems positive for none.   All other systems are reviewed and negative.    PHYSICAL EXAM: VS:  BP 140/70 mmHg  Pulse 70  Resp 16  Ht 6\' 3"  (1.905 m)  Wt 263 lb 14.4 oz (119.704 kg)  BMI 32.99 kg/m2 , BMI Body mass index is 32.99 kg/(m^2).  General: Alert, oriented x3, no distress Head: no evidence of trauma, PERRL, EOMI, no exophtalmos or lid lag, no myxedema, no xanthelasma; normal ears, nose and oropharynx Neck: normal jugular venous pulsations and no hepatojugular reflux; brisk carotid pulses without delay and no carotid bruits Chest: clear to auscultation, no signs of consolidation by percussion or palpation, normal fremitus, symmetrical and full respiratory excursions Steri-Strips in place, small ecchymosis in left subclavian area, no swelling/redness/warmth/discharge Cardiovascular: normal position and quality of the apical impulse, regular rhythm, normal first and paradoxically split second heart sounds, no murmurs, rubs or gallops Abdomen: no tenderness or distention, no masses by palpation, no abnormal pulsatility or arterial bruits, normal bowel sounds, no hepatosplenomegaly Extremities: no clubbing, cyanosis or edema; 2+ radial, ulnar and brachial pulses bilaterally; 2+ right femoral, posterior tibial and dorsalis pedis pulses; 2+ left femoral, posterior tibial and dorsalis pedis pulses; no subclavian or femoral bruits Neurological: grossly nonfocal Psych: euthymic mood, full affect   EKG:  EKG is not ordered today.   Recent Labs: 05/01/2015: ALT 16*; TSH 2.845 05/03/2015: BUN 30*;  Creatinine, Ser 1.81*; Hemoglobin 10.5*; Platelets 198; Potassium 4.5; Sodium 142    Lipid Panel    Component Value Date/Time   CHOL 87 10/09/2011 0523   TRIG 82 10/09/2011 0523   HDL 56 10/09/2011 0523   CHOLHDL 1.6 10/09/2011 0523   VLDL 16 10/09/2011 0523   LDLCALC 15 10/09/2011 0523      Wt Readings from Last 3 Encounters:  05/08/15 263 lb 14.4 oz (119.704 kg)  05/01/15 258 lb 13.1 oz (117.4 kg)  06/17/14 265 lb (120.203 kg)   ASSESSMENT AND PLAN:  1.  Normally functioning biventricular pacemaker.  Reminded about wound care instructions.  He has a follow-up appointment scheduled with Dr. Lovena Le on November 22, which will be a good opportunity to reduce RV pacing output.  2.  Complete heart block.  At presentation he had a consistent idioventricular escape rhythm in the 30s, but he should be considered pacemaker  dependent.  3.  Chronic diastolic heart failure.  His echocardiogram showed improvement in left ventricular systolic function, in the setting of elevated catecholamines in complete heart block.  It may also be a reflection of myocardial recovery following improve treatment of systemic hypertension and resolution of tachycardia-related cardiomyopathy.  Continue ace inhibitors and beta blockers. Although there are no overt physical findings of hyperkalemia, his weight gain is substantial and is probably related to a reduction in the dose of diuretic therapy.  Will increase the diuretic dose and potassium supplement up.  Recheck labs today.  4.  Acute on chronic renal insufficiency, likely related to reduced cardiac output secondary to severe bradycardia.  Labs ordered today.  Suspect improvement will be seen.  5.  Essential hypertension, controlled  6.  History of paroxysmal atrial fibrillation on chronic anticoagulation.  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    Current medicines are reviewed at length with the patient today.  The patient does not have  concerns regarding medicines.  Labs/ tests ordered today include:  Orders Placed This Encounter  Procedures  . Basic metabolic panel    Patient Instructions  Your physician has recommended you make the following change in your medication:   INCREASE BENAZEPRIL TO 40 MG DAILY  INCREASE POTASSIUM TO 20 MEQ DAILY  INCREASE FUROSEMIDE TO 80 MG DAILY  Your physician recommends that you return for lab work in: [facility]  [place] DR. Lovena Le 08/02/15 AT [place] OFFICE.  Dr. Sallyanne Kuster recommends that you schedule a follow-up appointment in: MAY 2017 WITH A DEVICE CHECK ([employer]) AT [place].         Mikael Spray, MD  05/08/2015 10:47 AM    Sanda Klein, MD, [facility] [phone] office [phone] pager

## 2015-05-15 ENCOUNTER — Other Ambulatory Visit: Payer: Medicare Other

## 2015-05-15 ENCOUNTER — Ambulatory Visit: Payer: Medicare Other

## 2015-05-17 LAB — CUP PACEART INCLINIC DEVICE CHECK
Date Time Interrogation Session: 20161207120335
Implantable Lead Implant Date: 20161122
Implantable Lead Location: 753858
Implantable Lead Location: 753859
Implantable Lead Location: 753860
Implantable Lead Model: 7741
Implantable Lead Serial Number: 521932
Implantable Lead Serial Number: 693952
Lead Channel Setting Sensing Sensitivity: 2.5 mV
MDC IDC LEAD IMPLANT DT: 20161122
MDC IDC LEAD IMPLANT DT: 20161122
MDC IDC LEAD SERIAL: 689802
MDC IDC PG SERIAL: 702386
MDC IDC SET LEADCHNL LV PACING AMPLITUDE: 3.5 V
MDC IDC SET LEADCHNL LV PACING PULSEWIDTH: 0.4 ms
MDC IDC SET LEADCHNL LV SENSING SENSITIVITY: 2.5 mV
MDC IDC SET LEADCHNL RA PACING AMPLITUDE: 3.5 V
MDC IDC SET LEADCHNL RV PACING AMPLITUDE: 2 V
MDC IDC SET LEADCHNL RV PACING PULSEWIDTH: 0.4 ms

## 2015-05-18 ENCOUNTER — Other Ambulatory Visit: Payer: Self-pay | Admitting: Cardiovascular Disease

## 2015-05-30 ENCOUNTER — Other Ambulatory Visit: Payer: Self-pay | Admitting: Cardiovascular Disease

## 2015-05-30 ENCOUNTER — Encounter: Payer: Self-pay | Admitting: Cardiovascular Disease

## 2015-06-29 ENCOUNTER — Ambulatory Visit: Payer: Medicare Other | Admitting: Cardiovascular Disease

## 2015-07-20 ENCOUNTER — Other Ambulatory Visit: Payer: Self-pay | Admitting: Cardiovascular Disease

## 2015-07-20 NOTE — Telephone Encounter (Signed)
Rx request sent to pharmacy.  

## 2015-08-01 ENCOUNTER — Other Ambulatory Visit: Payer: Self-pay | Admitting: Cardiovascular Disease

## 2015-08-01 NOTE — Telephone Encounter (Signed)
Rx request sent to pharmacy.  

## 2015-08-02 ENCOUNTER — Ambulatory Visit (INDEPENDENT_AMBULATORY_CARE_PROVIDER_SITE_OTHER): Payer: Medicare Other | Admitting: Internal Medicine

## 2015-08-02 ENCOUNTER — Encounter: Payer: Self-pay | Admitting: Internal Medicine

## 2015-08-02 VITALS — BP 138/60 | HR 72 | Ht 75.0 in | Wt 265.1 lb

## 2015-08-02 DIAGNOSIS — Z45018 Encounter for adjustment and management of other part of cardiac pacemaker: Secondary | ICD-10-CM

## 2015-08-02 DIAGNOSIS — I5022 Chronic systolic (congestive) heart failure: Secondary | ICD-10-CM

## 2015-08-02 LAB — CUP PACEART INCLINIC DEVICE CHECK
Implantable Lead Implant Date: 20161122
Implantable Lead Implant Date: 20161122
Implantable Lead Location: 753859
Implantable Lead Location: 753860
Implantable Lead Model: 7741
Implantable Lead Serial Number: 521932
Implantable Lead Serial Number: 689802
MDC IDC LEAD IMPLANT DT: 20161122
MDC IDC LEAD LOCATION: 753858
MDC IDC LEAD SERIAL: 693952
MDC IDC PG SERIAL: 702386
MDC IDC SESS DTM: 20170222145632

## 2015-08-02 NOTE — Progress Notes (Signed)
HPI Mr. Barrientos returns today for PPM followup. He is a pleasant 75 yo man with CHB, chronic systolic heart failure, who underwent biv PPM insertion 3 months ago. In the interim, he has done well. He does admit to some insomnia and he has not exercised as much as he was concerned that he would "mess up my PM". He denies chest pain or sob.   Allergies  Allergen Reactions  . Amiodarone Shortness Of Breath    Pulmonary toxicity with amiodarone     Current Outpatient Prescriptions  Medication Sig Dispense Refill  . benazepril (LOTENSIN) 40 MG tablet Take 1 tablet (40 mg total) by mouth daily. 30 tablet 2  . carvedilol (COREG) 25 MG tablet TAKE A HALF TABLET (12.5 MG) BY MOUTH EVERY MORNING AND A WHOLE TABLET (25 MG) EVERY EVENING    . doxazosin (CARDURA) 8 MG tablet TAKE 1 TABLET (8 MG TOTAL) BY MOUTH AT BEDTIME. 30 tablet 4  . furosemide (LASIX) 80 MG tablet TAKE 1 TABLET (80 MG TOTAL) BY MOUTH DAILY. 60 tablet 1  . metFORMIN (GLUCOPHAGE-XR) 750 MG 24 hr tablet Take 750 mg by mouth 2 (two) times daily.  2  . naproxen sodium (ANAPROX) 220 MG tablet Take 220 mg by mouth 2 (two) times daily as needed (moderate pain).     . potassium chloride (KLOR-CON M10) 10 MEQ tablet Take 2 tablets (20 mEq total) by mouth once. 60 tablet 3  . rosuvastatin (CRESTOR) 20 MG tablet Take 1 tablet (20 mg total) by mouth at bedtime. 30 tablet 3  . sitaGLIPtin (JANUVIA) 100 MG tablet Take 100 mg by mouth daily.    Alveda Reasons 20 MG TABS tablet TAKE 1 TABLET BY MOUTH DAILY 30 tablet 5   No current facility-administered medications for this visit.     Past Medical History  Diagnosis Date  . Hypertension   . Diabetes mellitus   . DM (diabetes mellitus), type 2  10/08/2011  . HTN (hypertension) 10/08/2011  . 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 afib 10/08/2011  . CHF (congestive heart failure) (HCC)     mild   . OSA (obstructive sleep apnea), pt with apnea during procedures  10/11/2011  . CKD (chronic kidney disease) stage 3, GFR 30-59 ml/min 10/13/2011  . Systolic dysfunction   . Complete heart block [facility])     [employer] PPM, Dr. Lovena Le 05/02/15    ROS:   All systems reviewed and negative except as noted in the HPI.   Past Surgical History  Procedure Laterality Date  . No past surgeries    . Tee without cardioversion  10/10/2011    Procedure: TRANSESOPHAGEAL ECHOCARDIOGRAM (TEE);  Surgeon: Sanda Klein, MD;  Location: [place];  Service: Cardiovascular;  Laterality: N/A;  . Cardioversion  10/11/2011    Procedure: CARDIOVERSION;  Surgeon: Leonie Man, MD;  Location: [place];  Service: Cardiovascular;  Laterality: N/A;  . US echocardiography  06/09/2012    EF 40-45%,mild concentric hypertrophy  . Nuclear stress  11/01/2011    severe global hypokinesis,dilated ventricle  . Ep implantable device N/A 05/02/2015    Procedure: BiV Pacemaker Insertion CRT-P;  Surgeon: Evans Lance, MD;  Location: [place] [facility];  Service: Cardiovascular;  Laterality: N/A;     Family History  Problem Relation Age of Onset  . Cancer Mother     Died of colon cancer young  . Kidney disease Father     ESRF/HD, deceased  Social History   Social History  . Marital Status: Married    Spouse Name: N/A  . Number of Children: N/A  . Years of Education: N/A   Occupational History  . Not on file.   Social History Main Topics  . Smoking status: Never Smoker   . Smokeless tobacco: Never Used  . Alcohol Use: No  . Drug Use: No  . Sexual Activity: Not Currently   Other Topics Concern  . Not on file   Social History Narrative     BP 138/60 mmHg  Pulse 72  Ht 6\' 3"  (1.905 m)  Wt 265 lb 1.9 oz (120.258 kg)  BMI 33.14 kg/m2  Physical Exam:  Well appearing NAD HEENT: Unremarkable Neck:  No JVD, no thyromegally Lymphatics:  No adenopathy Back:  No CVA tenderness Lungs:  Clear with no wheezes HEART:  Regular rate rhythm, no murmurs, no rubs, no  clicks Abd:  soft, positive bowel sounds, no organomegally, no rebound, no guarding Ext:  2 plus pulses, no edema, no cyanosis, no clubbing Skin:  No rashes no nodules Neuro:  CN II through XII intact, motor grossly intact  EKG - nsr with BiV Pacing  DEVICE  Normal device function.  See PaceArt for details.   Assess/Plan: 1. Complete heart block - he is s/p Biv PPM and doing well. He is encouraged to increase his physical activity 2. Chronic systolic heart failure - his symptoms are class 2. He will continue his current meds 3. Biv PPM - his device is working normally. We reprogrammed him to pace LV3-2 which had a better threshold today. 4. HTN - his blood pressure is stable. No change in meds today although I encouraged him to exercise more and reduce his salt intake.  Mikle Bosworth.D.

## 2015-08-02 NOTE — Patient Instructions (Signed)
Medication Instructions:  Your physician recommends that you continue on your current medications as directed. Please refer to the Current Medication list given to you today.   Labwork: None ordered   Testing/Procedures: None ordered   Follow-Up: Your physician wants you to follow-up in: 9 months with Dr Knox Saliva will receive a reminder letter in the mail two months in advance. If you don't receive a letter, please call our office to schedule the follow-up appointment.  Remote monitoring is used to monitor your Pacemaker  from home. This monitoring reduces the number of office visits required to check your device to one time per year. It allows [place] to keep an eye on the functioning of your device to ensure it is working properly. You are scheduled for a device check from home on 11/01/15. You may send your transmission at any time that day. If you have a wireless device, the transmission will be sent automatically. After your physician reviews your transmission, you will receive a postcard with your next transmission date.     Any Other Special Instructions Will Be Listed Below (If Applicable).     If you need a refill on your cardiac medications before your next appointment, please call your pharmacy.

## 2015-08-25 ENCOUNTER — Other Ambulatory Visit: Payer: Self-pay | Admitting: Cardiovascular Disease

## 2015-08-25 NOTE — Telephone Encounter (Signed)
Rx request sent to pharmacy.  

## 2015-10-21 ENCOUNTER — Other Ambulatory Visit: Payer: Self-pay | Admitting: Physician Assistant

## 2015-11-01 ENCOUNTER — Telehealth: Payer: Self-pay | Admitting: Cardiology

## 2015-11-01 ENCOUNTER — Ambulatory Visit (INDEPENDENT_AMBULATORY_CARE_PROVIDER_SITE_OTHER): Payer: Medicare Other | Admitting: *Deleted

## 2015-11-01 DIAGNOSIS — I442 Atrioventricular block, complete: Secondary | ICD-10-CM | POA: Diagnosis not present

## 2015-11-01 NOTE — Progress Notes (Signed)
Remote pacemaker transmission.   

## 2015-11-01 NOTE — Telephone Encounter (Signed)
Attempted to confirm remote transmission with pt. No answer and was unable to leave a message.   

## 2015-11-03 ENCOUNTER — Encounter (HOSPITAL_COMMUNITY): Payer: Self-pay | Admitting: *Deleted

## 2015-11-03 ENCOUNTER — Inpatient Hospital Stay (HOSPITAL_COMMUNITY)
Admission: EM | Admit: 2015-11-03 | Discharge: 2015-11-15 | DRG: 329 | Disposition: A | Discharge status: Home / Self Care | Payer: Medicare Other | Attending: Family Medicine | Admitting: Family Medicine

## 2015-11-03 ENCOUNTER — Emergency Department (HOSPITAL_COMMUNITY): Payer: Medicare Other

## 2015-11-03 DIAGNOSIS — I429 Cardiomyopathy, unspecified: Secondary | ICD-10-CM | POA: Diagnosis present

## 2015-11-03 DIAGNOSIS — Z888 Allergy status to other drugs, medicaments and biological substances status: Secondary | ICD-10-CM

## 2015-11-03 DIAGNOSIS — K269 Duodenal ulcer, unspecified as acute or chronic, without hemorrhage or perforation: Secondary | ICD-10-CM | POA: Diagnosis present

## 2015-11-03 DIAGNOSIS — D649 Anemia, unspecified: Secondary | ICD-10-CM

## 2015-11-03 DIAGNOSIS — C189 Malignant neoplasm of colon, unspecified: Secondary | ICD-10-CM | POA: Diagnosis present

## 2015-11-03 DIAGNOSIS — D490 Neoplasm of unspecified behavior of digestive system: Secondary | ICD-10-CM | POA: Diagnosis present

## 2015-11-03 DIAGNOSIS — E785 Hyperlipidemia, unspecified: Secondary | ICD-10-CM | POA: Diagnosis present

## 2015-11-03 DIAGNOSIS — N189 Chronic kidney disease, unspecified: Secondary | ICD-10-CM

## 2015-11-03 DIAGNOSIS — C184 Malignant neoplasm of transverse colon: Secondary | ICD-10-CM | POA: Diagnosis not present

## 2015-11-03 DIAGNOSIS — I5032 Chronic diastolic (congestive) heart failure: Secondary | ICD-10-CM | POA: Diagnosis present

## 2015-11-03 DIAGNOSIS — E119 Type 2 diabetes mellitus without complications: Secondary | ICD-10-CM

## 2015-11-03 DIAGNOSIS — N289 Disorder of kidney and ureter, unspecified: Secondary | ICD-10-CM

## 2015-11-03 DIAGNOSIS — K5900 Constipation, unspecified: Secondary | ICD-10-CM | POA: Diagnosis present

## 2015-11-03 DIAGNOSIS — E118 Type 2 diabetes mellitus with unspecified complications: Secondary | ICD-10-CM

## 2015-11-03 DIAGNOSIS — Z841 Family history of disorders of kidney and ureter: Secondary | ICD-10-CM

## 2015-11-03 DIAGNOSIS — N400 Enlarged prostate without lower urinary tract symptoms: Secondary | ICD-10-CM | POA: Diagnosis present

## 2015-11-03 DIAGNOSIS — Z800 Family history of malignant neoplasm of digestive organs: Secondary | ICD-10-CM

## 2015-11-03 DIAGNOSIS — I5042 Chronic combined systolic (congestive) and diastolic (congestive) heart failure: Secondary | ICD-10-CM | POA: Diagnosis present

## 2015-11-03 DIAGNOSIS — Z79899 Other long term (current) drug therapy: Secondary | ICD-10-CM

## 2015-11-03 DIAGNOSIS — I480 Paroxysmal atrial fibrillation: Secondary | ICD-10-CM | POA: Diagnosis present

## 2015-11-03 DIAGNOSIS — K5730 Diverticulosis of large intestine without perforation or abscess without bleeding: Secondary | ICD-10-CM | POA: Diagnosis present

## 2015-11-03 DIAGNOSIS — N170 Acute kidney failure with tubular necrosis: Secondary | ICD-10-CM | POA: Diagnosis present

## 2015-11-03 DIAGNOSIS — I442 Atrioventricular block, complete: Secondary | ICD-10-CM | POA: Diagnosis present

## 2015-11-03 DIAGNOSIS — I10 Essential (primary) hypertension: Secondary | ICD-10-CM | POA: Diagnosis present

## 2015-11-03 DIAGNOSIS — E1165 Type 2 diabetes mellitus with hyperglycemia: Secondary | ICD-10-CM | POA: Diagnosis present

## 2015-11-03 DIAGNOSIS — K922 Gastrointestinal hemorrhage, unspecified: Secondary | ICD-10-CM | POA: Diagnosis not present

## 2015-11-03 DIAGNOSIS — E669 Obesity, unspecified: Secondary | ICD-10-CM | POA: Diagnosis present

## 2015-11-03 DIAGNOSIS — G4733 Obstructive sleep apnea (adult) (pediatric): Secondary | ICD-10-CM | POA: Diagnosis present

## 2015-11-03 DIAGNOSIS — Z6832 Body mass index (BMI) 32.0-32.9, adult: Secondary | ICD-10-CM

## 2015-11-03 DIAGNOSIS — D509 Iron deficiency anemia, unspecified: Secondary | ICD-10-CM | POA: Diagnosis present

## 2015-11-03 DIAGNOSIS — Z7984 Long term (current) use of oral hypoglycemic drugs: Secondary | ICD-10-CM

## 2015-11-03 DIAGNOSIS — D500 Iron deficiency anemia secondary to blood loss (chronic): Secondary | ICD-10-CM | POA: Diagnosis present

## 2015-11-03 DIAGNOSIS — R3915 Urgency of urination: Secondary | ICD-10-CM | POA: Diagnosis not present

## 2015-11-03 DIAGNOSIS — E1122 Type 2 diabetes mellitus with diabetic chronic kidney disease: Secondary | ICD-10-CM | POA: Diagnosis present

## 2015-11-03 DIAGNOSIS — N179 Acute kidney failure, unspecified: Secondary | ICD-10-CM | POA: Diagnosis not present

## 2015-11-03 DIAGNOSIS — Z7901 Long term (current) use of anticoagulants: Secondary | ICD-10-CM

## 2015-11-03 DIAGNOSIS — Z950 Presence of cardiac pacemaker: Secondary | ICD-10-CM

## 2015-11-03 DIAGNOSIS — K6389 Other specified diseases of intestine: Secondary | ICD-10-CM | POA: Diagnosis present

## 2015-11-03 DIAGNOSIS — R531 Weakness: Secondary | ICD-10-CM | POA: Diagnosis not present

## 2015-11-03 DIAGNOSIS — Z791 Long term (current) use of non-steroidal anti-inflammatories (NSAID): Secondary | ICD-10-CM

## 2015-11-03 DIAGNOSIS — K921 Melena: Secondary | ICD-10-CM | POA: Diagnosis present

## 2015-11-03 DIAGNOSIS — G4700 Insomnia, unspecified: Secondary | ICD-10-CM | POA: Diagnosis present

## 2015-11-03 DIAGNOSIS — IMO0002 Reserved for concepts with insufficient information to code with codable children: Secondary | ICD-10-CM | POA: Diagnosis present

## 2015-11-03 DIAGNOSIS — N183 Chronic kidney disease, stage 3 (moderate): Secondary | ICD-10-CM | POA: Diagnosis present

## 2015-11-03 DIAGNOSIS — D62 Acute posthemorrhagic anemia: Secondary | ICD-10-CM | POA: Diagnosis present

## 2015-11-03 DIAGNOSIS — I130 Hypertensive heart and chronic kidney disease with heart failure and stage 1 through stage 4 chronic kidney disease, or unspecified chronic kidney disease: Secondary | ICD-10-CM | POA: Diagnosis present

## 2015-11-03 DIAGNOSIS — D127 Benign neoplasm of rectosigmoid junction: Secondary | ICD-10-CM | POA: Diagnosis present

## 2015-11-03 DIAGNOSIS — Z8711 Personal history of peptic ulcer disease: Secondary | ICD-10-CM

## 2015-11-03 HISTORY — DX: Personal history of other diseases of the circulatory system: Z86.79

## 2015-11-03 HISTORY — DX: Paroxysmal atrial fibrillation: I48.0

## 2015-11-03 HISTORY — DX: Type 2 diabetes mellitus without complications: E11.9

## 2015-11-03 LAB — GLUCOSE, CAPILLARY: Glucose-Capillary: 204 mg/dL — ABNORMAL HIGH (ref 65–99)

## 2015-11-03 LAB — BASIC METABOLIC PANEL
ANION GAP: 13 (ref 5–15)
BUN: 44 mg/dL — ABNORMAL HIGH (ref 6–20)
CO2: 23 mmol/L (ref 22–32)
Calcium: 9.9 mg/dL (ref 8.9–10.3)
Chloride: 99 mmol/L — ABNORMAL LOW (ref 101–111)
Creatinine, Ser: 2.92 mg/dL — ABNORMAL HIGH (ref 0.61–1.24)
GFR, EST AFRICAN AMERICAN: 23 mL/min — AB (ref >60)
GFR, EST NON AFRICAN AMERICAN: 20 mL/min — AB (ref >60)
GLUCOSE: 279 mg/dL — AB (ref 65–99)
POTASSIUM: 4.1 mmol/L (ref 3.5–5.1)
Sodium: 135 mmol/L (ref 135–145)

## 2015-11-03 LAB — FOLATE: FOLATE: 18 ng/mL (ref >5.9)

## 2015-11-03 LAB — CBC
HCT: 15.6 % — ABNORMAL LOW (ref 39.0–52.0)
HEMATOCRIT: 16.2 % — AB (ref 39.0–52.0)
HEMOGLOBIN: 4.6 g/dL — AB (ref 13.0–17.0)
Hemoglobin: 4.6 g/dL — CL (ref 13.0–17.0)
MCH: 17.5 pg — ABNORMAL LOW (ref 26.0–34.0)
MCH: 18 pg — AB (ref 26.0–34.0)
MCHC: 28.4 g/dL — AB (ref 30.0–36.0)
MCHC: 29.5 g/dL — ABNORMAL LOW (ref 30.0–36.0)
MCV: 61.6 fL — ABNORMAL LOW (ref 78.0–100.0)
MCV: 61.2 fL — AB (ref 78.0–100.0)
PLATELETS: 331 10*3/uL (ref 150–400)
Platelets: 309 10*3/uL (ref 150–400)
RBC: 2.63 MIL/uL — AB (ref 4.22–5.81)
RBC: 2.55 MIL/uL — AB (ref 4.22–5.81)
RDW: 17.1 % — ABNORMAL HIGH (ref 11.5–15.5)
RDW: 17.1 % — AB (ref 11.5–15.5)
WBC: 6.2 10*3/uL (ref 4.0–10.5)
WBC: 6.2 10*3/uL (ref 4.0–10.5)

## 2015-11-03 LAB — RETICULOCYTES
RBC.: 2.62 MIL/uL — AB (ref 4.22–5.81)
RETIC COUNT ABSOLUTE: 57.6 10*3/uL (ref 19.0–186.0)
RETIC CT PCT: 2.2 % (ref 0.4–3.1)

## 2015-11-03 LAB — PROTIME-INR
INR: 3.85 — ABNORMAL HIGH (ref 0.00–1.49)
Prothrombin Time: 36.9 seconds — ABNORMAL HIGH (ref 11.6–15.2)

## 2015-11-03 LAB — IRON AND TIBC
Iron: 16 ug/dL — ABNORMAL LOW (ref 45–182)
Saturation Ratios: 3 % — ABNORMAL LOW (ref 17.9–39.5)
TIBC: 466 ug/dL — ABNORMAL HIGH (ref 250–450)
UIBC: 450 ug/dL

## 2015-11-03 LAB — TROPONIN I

## 2015-11-03 LAB — PREPARE RBC (CROSSMATCH)

## 2015-11-03 LAB — POC OCCULT BLOOD, ED: Fecal Occult Bld: POSITIVE — AB

## 2015-11-03 LAB — ABO/RH: ABO/RH(D): O POS

## 2015-11-03 LAB — VITAMIN B12: VITAMIN B 12: 315 pg/mL (ref 180–914)

## 2015-11-03 LAB — BRAIN NATRIURETIC PEPTIDE: B Natriuretic Peptide: 215.8 pg/mL — ABNORMAL HIGH (ref 0.0–100.0)

## 2015-11-03 LAB — FERRITIN: Ferritin: 8 ng/mL — ABNORMAL LOW (ref 24–336)

## 2015-11-03 MED ORDER — DIPHENHYDRAMINE HCL 50 MG/ML IJ SOLN: 25.0000 mg | Freq: Every evening | INTRAMUSCULAR | Status: DC | PRN

## 2015-11-03 MED ORDER — PANTOPRAZOLE SODIUM 40 MG IV SOLR: 40.0000 mg | Freq: Two times a day (BID) | INTRAVENOUS | Status: DC

## 2015-11-03 MED ORDER — SODIUM CHLORIDE 0.9 % IV SOLN
80.0000 mg | Freq: Once | INTRAVENOUS | Status: AC
  Administered 2015-11-04: 80 mg via INTRAVENOUS
  Filled 2015-11-03: qty 80

## 2015-11-03 MED ORDER — ZOLPIDEM TARTRATE 5 MG PO TABS
5.0000 mg | ORAL_TABLET | Freq: Every evening | ORAL | Status: DC | PRN
Start: 2015-11-03 — End: 2015-11-09
  Administered 2015-11-04 – 2015-11-08 (×3): 5 mg via ORAL
  Filled 2015-11-03 (×3): qty 1

## 2015-11-03 MED ORDER — SODIUM CHLORIDE 0.9 % IV SOLN
8.0000 mg/h | INTRAVENOUS | Status: DC
  Administered 2015-11-04 – 2015-11-05 (×3): 8 mg/h via INTRAVENOUS
  Filled 2015-11-03 (×10): qty 80

## 2015-11-03 MED ORDER — DOXAZOSIN MESYLATE 8 MG PO TABS
8.0000 mg | ORAL_TABLET | Freq: Every day | ORAL | Status: DC
  Administered 2015-11-04 – 2015-11-14 (×11): 8 mg via ORAL
  Filled 2015-11-03 (×13): qty 1

## 2015-11-03 MED ORDER — SODIUM CHLORIDE 0.9 % IV SOLN
Freq: Once | INTRAVENOUS | Status: AC
  Administered 2015-11-03: 22:00:00 via INTRAVENOUS

## 2015-11-03 MED ORDER — ALBUTEROL SULFATE (2.5 MG/3ML) 0.083% IN NEBU: 2.5000 mg | INHALATION_SOLUTION | RESPIRATORY_TRACT | Status: DC | PRN

## 2015-11-03 MED ORDER — ONDANSETRON HCL 4 MG PO TABS: 4.0000 mg | ORAL_TABLET | Freq: Four times a day (QID) | ORAL | Status: DC | PRN

## 2015-11-03 MED ORDER — ACETAMINOPHEN 650 MG RE SUPP: 650.0000 mg | Freq: Four times a day (QID) | RECTAL | Status: DC | PRN

## 2015-11-03 MED ORDER — CARVEDILOL 12.5 MG PO TABS
12.5000 mg | ORAL_TABLET | Freq: Every morning | ORAL | Status: DC
  Administered 2015-11-04 – 2015-11-15 (×11): 12.5 mg via ORAL
  Filled 2015-11-03 (×11): qty 1

## 2015-11-03 MED ORDER — ACETAMINOPHEN 325 MG PO TABS
650.0000 mg | ORAL_TABLET | Freq: Four times a day (QID) | ORAL | Status: DC | PRN
  Administered 2015-11-04: 650 mg via ORAL
  Filled 2015-11-03: qty 2

## 2015-11-03 MED ORDER — FUROSEMIDE 10 MG/ML IJ SOLN
20.0000 mg | Freq: Once | INTRAMUSCULAR | Status: AC
  Administered 2015-11-04: 20 mg via INTRAVENOUS
  Filled 2015-11-03: qty 2

## 2015-11-03 MED ORDER — ROSUVASTATIN CALCIUM 20 MG PO TABS
20.0000 mg | ORAL_TABLET | Freq: Every day | ORAL | Status: DC
  Administered 2015-11-04 – 2015-11-09 (×6): 20 mg via ORAL
  Filled 2015-11-03 (×6): qty 1

## 2015-11-03 MED ORDER — ONDANSETRON HCL 4 MG/2ML IJ SOLN
4.0000 mg | Freq: Four times a day (QID) | INTRAMUSCULAR | Status: DC | PRN
  Administered 2015-11-10: 4 mg via INTRAVENOUS

## 2015-11-03 NOTE — ED Notes (Signed)
Dr. Dayna Barker informed of pts hbg of 4.6. Pt to be taken back to next available room.

## 2015-11-03 NOTE — ED Provider Notes (Addendum)
CSN: [id number]     Arrival date & time 11/03/15  1851 History   First MD Initiated Contact with Patient 11/03/15 2002     Chief Complaint  Patient presents with  . Weakness     (Consider location/radiation/quality/duration/timing/severity/associated sxs/prior Treatment) Patient is a 75 y.o. male presenting with weakness.  Weakness This is a new problem. The current episode started more than 1 week ago. The problem occurs constantly. The problem has been gradually worsening. Pertinent negatives include no chest pain, no headaches and no shortness of breath. The symptoms are aggravated by exertion. Nothing relieves the symptoms. He has tried nothing for the symptoms.    Past Medical History  Diagnosis Date  . Hypertension   . Diabetes mellitus   . DM (diabetes mellitus), type 2  10/08/2011  . HTN (hypertension) 10/08/2011  . 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 afib 10/08/2011  . CHF (congestive heart failure) (HCC)     mild   . OSA (obstructive sleep apnea), pt with apnea during procedures 10/11/2011  . CKD (chronic kidney disease) stage 3, GFR 30-59 ml/min 10/13/2011  . Systolic dysfunction   . 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 Lovena Le 05/02/15   Past Surgical History  Procedure Laterality Date  . No past surgeries    . Tee without cardioversion  10/10/2011    Procedure: TRANSESOPHAGEAL ECHOCARDIOGRAM (TEE);  Surgeon: Sanda Klein, MD;  Location: [place];  Service: Cardiovascular;  Laterality: N/A;  . Cardioversion  10/11/2011    Procedure: CARDIOVERSION;  Surgeon: Leonie Man, MD;  Location: [place];  Service: Cardiovascular;  Laterality: N/A;  . US echocardiography  06/09/2012    EF 40-45%,mild concentric hypertrophy  . Nuclear stress  11/01/2011    severe global hypokinesis,dilated ventricle  . Ep implantable device N/A 05/02/2015    Procedure: BiV Pacemaker Insertion CRT-P;  Surgeon: Evans Lance, MD;  Location: [place] CV LAB;  Service: Cardiovascular;  Laterality: N/A;   Family History  Problem Relation Age of Onset  . Cancer Mother     Died of colon cancer young  . Kidney disease Father     ESRF/HD, deceased   Social History  Substance Use Topics  . Smoking status: Never Smoker   . Smokeless tobacco: Never Used  . Alcohol Use: No    Review of Systems  Constitutional: Negative for fever and chills.  Respiratory: Negative for shortness of breath.   Cardiovascular: Negative for chest pain.  Gastrointestinal: Negative for nausea, vomiting and diarrhea.  Endocrine: Negative for polydipsia and polyuria.  Genitourinary: Negative for dysuria, hematuria and testicular pain.  Musculoskeletal: Negative for neck pain.  Neurological: Positive for weakness. Negative for headaches.  Psychiatric/Behavioral: Negative for hallucinations, confusion and decreased concentration.  All other systems reviewed and are negative.     Allergies  Amiodarone  Home Medications   Prior to Admission medications   Medication Sig Start Date End Date Taking? Authorizing Provider  benazepril (LOTENSIN) 40 MG tablet Take 1 tablet (40 mg total) by mouth daily. 05/08/15  Yes [name], MD  carvedilol (COREG) 25 MG tablet TAKE A HALF TABLET (12.5 MG) EVERY MORNING AND A WHOLE TABLET (25 MG) EVERY EVENING 08/25/15  Yes [name], MD  doxazosin (CARDURA) 8 MG tablet TAKE 1 TABLET (8 MG TOTAL) BY MOUTH AT BEDTIME. 07/20/15  Yes [name], MD  furosemide (LASIX) 80 MG tablet TAKE 1 TABLET (80 MG TOTAL) BY MOUTH DAILY.  08/01/15  Yes [name], MD  metFORMIN (GLUCOPHAGE-XR) 750 MG 24 hr tablet Take 750 mg by mouth daily with breakfast.  02/20/15  Yes Historical Provider, MD  naproxen sodium (ANAPROX) 220 MG tablet Take 220 mg by mouth 2 (two) times daily as needed (moderate pain).    Yes Historical Provider, MD  rosuvastatin (CRESTOR) 20 MG tablet TAKE 1 TABLET (20 MG TOTAL) BY MOUTH AT BEDTIME. 10/23/15  Yes  Evans Lance, MD  sitaGLIPtin (JANUVIA) 100 MG tablet Take 100 mg by mouth daily.   Yes Historical Provider, MD  XARELTO 20 MG TABS tablet TAKE 1 TABLET BY MOUTH DAILY Patient taking differently: TAKE 1 TABLET BY MOUTH DAILY IN EVENING 05/19/15  Yes [name], MD  potassium chloride (KLOR-CON M10) 10 MEQ tablet Take 2 tablets (20 mEq total) by mouth once. 05/08/15   [name], MD   BP 121/56 mmHg  Pulse 70  Temp(Src) 97.9 F (36.6 C) (Oral)  Resp 18  Ht 6\' 3"  (1.905 m)  Wt 258 lb 8 oz (117.255 kg)  BMI 32.31 kg/m2  SpO2 100% Physical Exam  Constitutional: He is oriented to person, place, and time. He appears well-developed and well-nourished.  HENT:  Head: Normocephalic and atraumatic.  Neck: Normal range of motion.  Cardiovascular: Normal rate.   Pulmonary/Chest: Effort normal. No respiratory distress. He has no wheezes.  Abdominal: Soft. He exhibits no distension. There is no tenderness.  Musculoskeletal: Normal range of motion. He exhibits no edema or tenderness.  Neurological: He is alert and oriented to person, place, and time. No cranial nerve deficit. Coordination normal.  Skin: Skin is warm and dry.  Nursing note and vitals reviewed.   ED Course  Procedures (including critical care time)  CRITICAL CARE Performed by: Merrily Pew  Total critical care time: 35 minutes Critical care time was exclusive of separately billable procedures and treating other patients. Critical care was necessary to treat or prevent imminent or life-threatening deterioration. Critical care was time spent personally by me on the following activities: development of treatment plan with patient and/or surrogate as well as nursing, discussions with consultants, evaluation of patient's response to treatment, examination of patient, obtaining history from patient or surrogate, ordering and performing treatments and interventions, ordering and review of laboratory studies, ordering and  review of radiographic studies, pulse oximetry and re-evaluation of patient's condition.   Labs Review Labs Reviewed  BASIC METABOLIC PANEL - Abnormal; Notable for the following:    Chloride 99 (*)    Glucose, Bld 279 (*)    BUN 44 (*)    Creatinine, Ser 2.92 (*)    GFR calc non Af Amer 20 (*)    GFR calc Af Amer 23 (*)    All other components within normal limits  CBC - Abnormal; Notable for the following:    RBC 2.63 (*)    Hemoglobin 4.6 (*)    HCT 16.2 (*)    MCV 61.6 (*)    MCH 17.5 (*)    MCHC 28.4 (*)    RDW 17.1 (*)    All other components within normal limits  BRAIN NATRIURETIC PEPTIDE - Abnormal; Notable for the following:    B Natriuretic Peptide 215.8 (*)    All other components within normal limits  CBC - Abnormal; Notable for the following:    RBC 2.55 (*)    Hemoglobin 4.6 (*)    HCT 15.6 (*)    MCV 61.2 (*)    MCH 18.0 (*)  MCHC 29.5 (*)    RDW 17.1 (*)    All other components within normal limits  IRON AND TIBC - Abnormal; Notable for the following:    Iron 16 (*)    TIBC 466 (*)    Saturation Ratios 3 (*)    All other components within normal limits  FERRITIN - Abnormal; Notable for the following:    Ferritin 8 (*)    All other components within normal limits  RETICULOCYTES - Abnormal; Notable for the following:    RBC. 2.62 (*)    All other components within normal limits  POC OCCULT BLOOD, ED - Abnormal; Notable for the following:    Fecal Occult Bld POSITIVE (*)    All other components within normal limits  TROPONIN I  VITAMIN B12  FOLATE  OCCULT BLOOD X 1 CARD TO LAB, STOOL  PROTIME-INR  TYPE AND SCREEN  ABO/RH  PREPARE RBC (CROSSMATCH)    Imaging Review Dg Chest 2 View  11/03/2015  CLINICAL DATA:  Shortness of breath. EXAM: CHEST  2 VIEW COMPARISON:  May 03, 2015. FINDINGS: The heart size and mediastinal contours are within normal limits. Both lungs are clear. No pneumothorax or pleural effusion is noted. Left-sided pacemaker  is unchanged in position. The visualized skeletal structures are unremarkable. IMPRESSION: No active cardiopulmonary disease. Electronically Signed   By: Marijo Conception, M.D.   On: 11/03/2015 19:30   I have personally reviewed and evaluated these images and lab results as part of my medical decision-making.   EKG Interpretation   Date/Time:  Friday Nov 03 2015 18:55:48 EDT Ventricular Rate:  73 PR Interval:  192 QRS Duration: 110 QT Interval:  416 QTC Calculation: 458 R Axis:   -73 Text Interpretation:  Normal sinus rhythm Left axis deviation Inferior  infarct , age undetermined Anterolateral infarct , age undetermined  Abnormal ECG Confirmed by [facility] MD, Corene Cornea [id number]) on 11/03/2015 8:05:04 PM      MDM   Final diagnoses:  Anemia, unspecified anemia type  Acute on chronic renal insufficiency (HCC)    Worsening weakness likely 2/2 anemia of 4.6. Anemia panel ordered. Transfusion of 3 units ordered. Patient also with hemoccult positive stool, GI consulted.  No HD instability so will not attempt reversal of xarelto at this time, will admit to medicine.     Merrily Pew, MD 11/03/15 2211

## 2015-11-03 NOTE — ED Notes (Signed)
The pt is c/o sob with exertion with weakness since may 14th he has tried to get in to see his cardiologist but the only appointment is may 31st and he feels like he cannot wait to  Be seen.    h has also had an unsteady gait

## 2015-11-03 NOTE — H&P (Signed)
History and Physical    Ernest Lara [id number] DOB: 06/11/1940 DOA: 11/03/2015   Referring MD/NP/PA: Dr. Dorise Bullion PCP: [name], MD  Patient coming from: Home  Chief Complaint: Weakness  HPI: Ernest Lara is a 75 y.o. male with medical history significant of HTN, HLD, PAF on xarelto, Systolic CHF EF [id number] in 04/2015, CKD stage III, diabetes mellitus type 2, s/p PM; who presents with approximately 3 weeks of progressively worsening weakness. Normally patient walks anywhere from 30-40 minutes 3 times per week. Suspects this was his initial signed something was not right as he progressively became more easily fatigued doing his normal day-to-day tasks. Within the last few days it became even difficult for him to walk from his bed to the bathroom. Changes in position made symptoms worse as it felt as if he was going to lose his balance. Patient denies having any nausea, abdominal pain, lightheadedness, chest pain, dysuria, gross blood in urine/stool, diarrhea, fever,or  chills. Most recent change that he admits to is that he has been taking Aleve p.m. to help him sleep every other day for approximately 2 weeks or more due to insomnia. He notes having hard stools every other day. Patient has significant for had a colonoscopy. Reports that it was previously scheduled for some reason has never performed.  ED course: Upon admission patient was seen to be afebrile with blood pressure initially noted to be as low as 99/53. All other vital signs within normal limits. Lab work revealed a hemoglobin of 4.6 that was verified on repeat check. Patient found to be Hemoccult positive. Chest x-ray showed no acute disease.   Review of Systems: As per HPI otherwise 10 point review of systems negative.   Past Medical History  Diagnosis Date  . Hypertension   . Diabetes mellitus   . DM (diabetes mellitus), type 2  10/08/2011  . HTN (hypertension) 10/08/2011  . Cardiomyopathy, poss. related to  tachycardia , new 10/08/2011  . afib 10/08/2011  . CHF (congestive heart failure) (HCC)     mild   . OSA (obstructive sleep apnea), pt with apnea during procedures 10/11/2011  . CKD (chronic kidney disease) stage 3, GFR 30-59 ml/min 10/13/2011  . Systolic dysfunction   . 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 Lovena Le 05/02/15    Past Surgical History  Procedure Laterality Date  . No past surgeries    . Tee without cardioversion  10/10/2011    Procedure: TRANSESOPHAGEAL ECHOCARDIOGRAM (TEE);  Surgeon: Sanda Klein, MD;  Location: [place];  Service: Cardiovascular;  Laterality: N/A;  . Cardioversion  10/11/2011    Procedure: CARDIOVERSION;  Surgeon: Leonie Man, MD;  Location: [place];  Service: Cardiovascular;  Laterality: N/A;  . US echocardiography  06/09/2012    EF 40-45%,mild concentric hypertrophy  . Nuclear stress  11/01/2011    severe global hypokinesis,dilated ventricle  . Ep implantable device N/A 05/02/2015    Procedure: BiV Pacemaker Insertion CRT-P;  Surgeon: Evans Lance, MD;  Location: [place] CV LAB;  Service: Cardiovascular;  Laterality: N/A;     reports that he has never smoked. He has never used smokeless tobacco. He reports that he does not drink alcohol or use illicit drugs.  Allergies  Allergen Reactions  . Amiodarone Shortness Of Breath    Pulmonary toxicity with amiodarone    Family History  Problem Relation Age of Onset  . Cancer Mother     Died of colon cancer young  .  Kidney disease Father     ESRF/HD, deceased    Prior to Admission medications   Medication Sig Start Date End Date Taking? Authorizing Provider  benazepril (LOTENSIN) 40 MG tablet Take 1 tablet (40 mg total) by mouth daily. 05/08/15  Yes [name], MD  carvedilol (COREG) 25 MG tablet TAKE A HALF TABLET (12.5 MG) EVERY MORNING AND A WHOLE TABLET (25 MG) EVERY EVENING 08/25/15  Yes [name], MD  doxazosin (CARDURA) 8 MG tablet TAKE 1 TABLET (8 MG TOTAL)  BY MOUTH AT BEDTIME. 07/20/15  Yes [name], MD  furosemide (LASIX) 80 MG tablet TAKE 1 TABLET (80 MG TOTAL) BY MOUTH DAILY. 08/01/15  Yes [name], MD  metFORMIN (GLUCOPHAGE-XR) 750 MG 24 hr tablet Take 750 mg by mouth daily with breakfast.  02/20/15  Yes Historical Provider, MD  naproxen sodium (ANAPROX) 220 MG tablet Take 220 mg by mouth 2 (two) times daily as needed (moderate pain).    Yes Historical Provider, MD  rosuvastatin (CRESTOR) 20 MG tablet TAKE 1 TABLET (20 MG TOTAL) BY MOUTH AT BEDTIME. 10/23/15  Yes Evans Lance, MD  sitaGLIPtin (JANUVIA) 100 MG tablet Take 100 mg by mouth daily.   Yes Historical Provider, MD  XARELTO 20 MG TABS tablet TAKE 1 TABLET BY MOUTH DAILY Patient taking differently: TAKE 1 TABLET BY MOUTH DAILY IN EVENING 05/19/15  Yes [name], MD  potassium chloride (KLOR-CON M10) 10 MEQ tablet Take 2 tablets (20 mEq total) by mouth once. 05/08/15   Sanda Klein, MD    Physical Exam: Filed Vitals:   11/03/15 1858 11/03/15 1905 11/03/15 2015 11/03/15 2030  BP: 99/53  120/61 124/61  Pulse: 77  70 72  Temp: 98.6 F (37 C)     TempSrc: Oral     Resp: 18  22   Height:  6\' 3"  (1.905 m)    Weight:  117.255 kg (258 lb 8 oz)    SpO2: 100%  100% 100%      Constitutional: NAD, calm, comfortable Filed Vitals:   11/03/15 1858 11/03/15 1905 11/03/15 2015 11/03/15 2030  BP: 99/53  120/61 124/61  Pulse: 77  70 72  Temp: 98.6 F (37 C)     TempSrc: Oral     Resp: 18  22   Height:  6\' 3"  (1.905 m)    Weight:  117.255 kg (258 lb 8 oz)    SpO2: 100%  100% 100%   Eyes: PERRL, lids and conjunctivae normal ENMT: Mucous membranes are moist. Posterior pharynx clear of any exudate or lesions. Normal dentition.  Neck: normal, supple, no masses, no thyromegaly Respiratory: clear to auscultation bilaterally, no wheezing, no crackles. Normal respiratory effort. No accessory muscle use.  Cardiovascular: Regular rate and rhythm, no murmurs / rubs / gallops. No  extremity edema. 2+ pedal pulses. No carotid bruits.  Abdomen: no tenderness, no masses palpated. No hepatosplenomegaly. Bowel sounds positive.  Musculoskeletal: no clubbing / cyanosis. No joint deformity upper and lower extremities. Good ROM, no contractures. Normal muscle tone.  Skin: no rashes, lesions, ulcers. No induration Neurologic: CN 2-12 grossly intact. Sensation intact, DTR normal. Strength 4+/5 in all 4.  Psychiatric: Normal judgment and insight. Alert and oriented x 3. Normal mood.     Labs on Admission: I have personally reviewed following labs and imaging studies  CBC:  Recent Labs Lab 11/03/15 1913 11/03/15 1956  WBC 6.2 6.2  HGB 4.6* 4.6*  HCT 16.2* 15.6*  MCV 61.6* 61.2*  PLT 309  [id number]   Basic Metabolic Panel:  Recent Labs Lab 11/03/15 1913  NA 135  K 4.1  CL 99*  CO2 23  GLUCOSE 279*  BUN 44*  CREATININE 2.92*  CALCIUM 9.9   GFR: Estimated Creatinine Clearance: 30.6 mL/min (by C-G formula based on Cr of 2.92). Liver Function Tests: No results for input(s): AST, ALT, ALKPHOS, BILITOT, PROT, ALBUMIN in the last 168 hours. No results for input(s): LIPASE, AMYLASE in the last 168 hours. No results for input(s): AMMONIA in the last 168 hours. Coagulation Profile: No results for input(s): INR, PROTIME in the last 168 hours. Cardiac Enzymes:  Recent Labs Lab 11/03/15 1913  TROPONINI <0.03   BNP (last 3 results) No results for input(s): PROBNP in the last 8760 hours. HbA1C: No results for input(s): HGBA1C in the last 72 hours. CBG: No results for input(s): GLUCAP in the last 168 hours. Lipid Profile: No results for input(s): CHOL, HDL, LDLCALC, TRIG, CHOLHDL, LDLDIRECT in the last 72 hours. Thyroid Function Tests: No results for input(s): TSH, T4TOTAL, FREET4, T3FREE, THYROIDAB in the last 72 hours. Anemia Panel:  Recent Labs  11/03/15 1956  RETICCTPCT 2.2   Urine analysis: No results found for: COLORURINE, APPEARANCEUR, LABSPEC, PHURINE,  GLUCOSEU, HGBUR, BILIRUBINUR, KETONESUR, PROTEINUR, UROBILINOGEN, NITRITE, LEUKOCYTESUR Sepsis Labs: No results found for this or any previous visit (from the past 240 hour(s)).   Radiological Exams on Admission: Dg Chest 2 View  11/03/2015  CLINICAL DATA:  Shortness of breath. EXAM: CHEST  2 VIEW COMPARISON:  May 03, 2015. FINDINGS: The heart size and mediastinal contours are within normal limits. Both lungs are clear. No pneumothorax or pleural effusion is noted. Left-sided pacemaker is unchanged in position. The visualized skeletal structures are unremarkable. IMPRESSION: No active cardiopulmonary disease. Electronically Signed   By: Marijo Conception, M.D.   On: 11/03/2015 19:30    EKG: Independently reviewed. Sinus normal sinus rhythm with left axis deviation   Assessment/Plan Acute GI bleed: Patient complains of three-week history of treated only worsening fatigue and weakness. Patient found to be guaiac positive on rectal exam, but no note of gross blood on rectal exam. ED physician consulted gastroenterology. Suspect patient could be having acute upper GI bleed given history of Aleve use. - admit to the telemetry bed(given cardiac history and severe anemia)  - NPO except meds - Consider IV NS at 100/h if bp is low: Goal MAP>65  - Protonix bolus and gtt  - Gastroenterology consulted in ED   Acute blood loss anemia: Patient Presents with weakness. Gives history of every other day Aleve use blood thinners.  a hemoglobin of 4.6 on admission that has been verified.baseline hemoglobin around 10.5-11. Patient noted to have significantly low MCV and MCH. Reticulocyte count does not appear to be elevated at 57.6. Discussed with the ED physician and transfuse at least 3 units of PRBCs. - Continue transfusion of 3 units of PRBCs - Continue to monitor H&H q 4 hours following blood transfusion - Transfuse as needed to keep a goal hemoglobin >8 - Check electrolytes in a.m. of ionized  calcium  Iron deficiency - Would recommend starting Ferrous sulfate supplementation when able   Paroxysmal Atrial fibrillation/ flutter on chronic anticoagulation: Patient currently in sinus rhythm heart rates controlled less than 100.  - Held Xarelto - Continue Coreg  Acute kidney injury on chronic kidney disease stage III: Baseline creatinine noted to be somewhere around 1.4-1.9 upon review of previous admissions. However, creatinine acutely elevated to 2.92 with a  BUN of 44 on admission. Suspect acutely worsened secondary to hypovolemic state with acute anemia. - Repeat BMP in a.m. - Held nephrotoxic agents  Systolic congestive heart failure last EF 60-65%:  - Strict I&Os and daily weights - Continuous pulse oximetry with nasal cannula oxygen as needed to keep O2 sats greater than 92% - IV Lasix as needed for signs of fluid overload ongoing blood transfusions   Essential hypertension - Held benazepril and lasix - Continue Coreg   Diabetes mellitus type 2 with hyperglycemia - Held metformin and Junuvia - Hypoglycemic protocols - CBGs every 4 hours with a sensitive sliding scale insulin  S/p pacemaker: Implantation of a [employer] CRT PPM on 05/02/15 by [name] for symptomatic bradycardia.  Hyperlipidemia - Continue Crestor   BPH - Continue doxazosin  Insomnia - Benadryl prn  DVT prophylaxis: None   Code Status: Full Family Communication: Discussed plan with son present at bedside  Disposition Plan: Possible discharge home in 2-3 days Consults called: GI called by Dr. Lowella Dell  Admission status: Observation telemetry  Norval Morton MD [facility] Pager [phone]  If 7PM-7AM, please contact night-coverage [email or web address] Password [email or web address]  11/03/2015, 9:12 PM

## 2015-11-03 NOTE — ED Notes (Signed)
Attempted to call report x 1  

## 2015-11-03 NOTE — ED Provider Notes (Signed)
Notified of critical value in the waiting room: Hemoglobin in the 4's. On review of vitals he has borderline low BP. Discussed with nurse that patient needs to come back to bed as soon as possible.   Merrily Pew, MD 11/03/15 1944

## 2015-11-04 ENCOUNTER — Encounter (HOSPITAL_COMMUNITY): Payer: Self-pay

## 2015-11-04 DIAGNOSIS — D62 Acute posthemorrhagic anemia: Secondary | ICD-10-CM | POA: Diagnosis present

## 2015-11-04 DIAGNOSIS — K2951 Unspecified chronic gastritis with bleeding: Secondary | ICD-10-CM | POA: Diagnosis not present

## 2015-11-04 DIAGNOSIS — K264 Chronic or unspecified duodenal ulcer with hemorrhage: Secondary | ICD-10-CM | POA: Diagnosis not present

## 2015-11-04 DIAGNOSIS — K921 Melena: Secondary | ICD-10-CM

## 2015-11-04 DIAGNOSIS — I442 Atrioventricular block, complete: Secondary | ICD-10-CM | POA: Diagnosis not present

## 2015-11-04 DIAGNOSIS — N179 Acute kidney failure, unspecified: Secondary | ICD-10-CM | POA: Diagnosis not present

## 2015-11-04 DIAGNOSIS — Z8679 Personal history of other diseases of the circulatory system: Secondary | ICD-10-CM

## 2015-11-04 DIAGNOSIS — I480 Paroxysmal atrial fibrillation: Secondary | ICD-10-CM | POA: Diagnosis not present

## 2015-11-04 DIAGNOSIS — K625 Hemorrhage of anus and rectum: Secondary | ICD-10-CM | POA: Diagnosis not present

## 2015-11-04 DIAGNOSIS — N189 Chronic kidney disease, unspecified: Secondary | ICD-10-CM | POA: Diagnosis not present

## 2015-11-04 DIAGNOSIS — N289 Disorder of kidney and ureter, unspecified: Secondary | ICD-10-CM | POA: Diagnosis not present

## 2015-11-04 DIAGNOSIS — K2961 Other gastritis with bleeding: Secondary | ICD-10-CM | POA: Diagnosis not present

## 2015-11-04 DIAGNOSIS — K922 Gastrointestinal hemorrhage, unspecified: Secondary | ICD-10-CM | POA: Diagnosis not present

## 2015-11-04 LAB — CBC
HCT: 23.3 % — ABNORMAL LOW (ref 39.0–52.0)
Hemoglobin: 7 g/dL — ABNORMAL LOW (ref 13.0–17.0)
MCH: 20.2 pg — AB (ref 26.0–34.0)
MCHC: 30 g/dL (ref 30.0–36.0)
MCV: 67.3 fL — AB (ref 78.0–100.0)
Platelets: 260 10*3/uL (ref 150–400)
RBC: 3.46 MIL/uL — AB (ref 4.22–5.81)
RDW: 20.5 % — AB (ref 11.5–15.5)
WBC: 6.1 10*3/uL (ref 4.0–10.5)

## 2015-11-04 LAB — GLUCOSE, CAPILLARY
GLUCOSE-CAPILLARY: 120 mg/dL — AB (ref 65–99)
GLUCOSE-CAPILLARY: 131 mg/dL — AB (ref 65–99)
GLUCOSE-CAPILLARY: 179 mg/dL — AB (ref 65–99)
Glucose-Capillary: 257 mg/dL — ABNORMAL HIGH (ref 65–99)
Glucose-Capillary: 209 mg/dL — ABNORMAL HIGH (ref 65–99)
Glucose-Capillary: 159 mg/dL — ABNORMAL HIGH (ref 65–99)

## 2015-11-04 LAB — PREPARE RBC (CROSSMATCH)

## 2015-11-04 LAB — PROTIME-INR
INR: 2.07 — AB (ref 0.00–1.49)
PROTHROMBIN TIME: 23.1 s — AB (ref 11.6–15.2)

## 2015-11-04 MED ORDER — PEG 3350-KCL-NA BICARB-NACL 420 G PO SOLR
4000.0000 mL | Freq: Once | ORAL | Status: AC
  Administered 2015-11-04: 4000 mL via ORAL
  Filled 2015-11-04: qty 4000

## 2015-11-04 MED ORDER — SODIUM CHLORIDE 0.9 % IV SOLN
Freq: Once | INTRAVENOUS | Status: AC
  Administered 2015-11-04: 12:00:00 via INTRAVENOUS

## 2015-11-04 MED ORDER — SODIUM CHLORIDE 0.9 % IV SOLN
Freq: Once | INTRAVENOUS | Status: AC
  Administered 2015-11-04: 11:00:00 via INTRAVENOUS

## 2015-11-04 MED ORDER — FUROSEMIDE 10 MG/ML IJ SOLN
40.0000 mg | Freq: Once | INTRAMUSCULAR | Status: AC
  Administered 2015-11-04: 40 mg via INTRAVENOUS
  Filled 2015-11-04: qty 4

## 2015-11-04 MED ORDER — INSULIN ASPART 100 UNIT/ML ~~LOC~~ SOLN
0.0000 [IU] | SUBCUTANEOUS | Status: DC
  Administered 2015-11-04: 3 [IU] via SUBCUTANEOUS
  Administered 2015-11-04: 2 [IU] via SUBCUTANEOUS
  Administered 2015-11-04: 5 [IU] via SUBCUTANEOUS
  Administered 2015-11-04: 2 [IU] via SUBCUTANEOUS
  Administered 2015-11-05 (×2): 1 [IU] via SUBCUTANEOUS
  Administered 2015-11-05: 3 [IU] via SUBCUTANEOUS
  Administered 2015-11-05 – 2015-11-07 (×4): 2 [IU] via SUBCUTANEOUS
  Administered 2015-11-07: 1 [IU] via SUBCUTANEOUS
  Administered 2015-11-07: 3 [IU] via SUBCUTANEOUS
  Administered 2015-11-08: 2 [IU] via SUBCUTANEOUS
  Administered 2015-11-08: 1 [IU] via SUBCUTANEOUS
  Administered 2015-11-08: 2 [IU] via SUBCUTANEOUS
  Administered 2015-11-08: 1 [IU] via SUBCUTANEOUS
  Administered 2015-11-08: 2 [IU] via SUBCUTANEOUS
  Administered 2015-11-08: 1 [IU] via SUBCUTANEOUS
  Administered 2015-11-09: 3 [IU] via SUBCUTANEOUS
  Administered 2015-11-09 – 2015-11-10 (×2): 2 [IU] via SUBCUTANEOUS
  Administered 2015-11-10 (×2): 3 [IU] via SUBCUTANEOUS
  Administered 2015-11-11 (×4): 1 [IU] via SUBCUTANEOUS
  Administered 2015-11-12: 2 [IU] via SUBCUTANEOUS
  Administered 2015-11-12: 1 [IU] via SUBCUTANEOUS
  Administered 2015-11-12 (×2): 2 [IU] via SUBCUTANEOUS
  Administered 2015-11-12 – 2015-11-13 (×4): 1 [IU] via SUBCUTANEOUS
  Administered 2015-11-13: 3 [IU] via SUBCUTANEOUS
  Administered 2015-11-13 (×2): 1 [IU] via SUBCUTANEOUS
  Administered 2015-11-13: 3 [IU] via SUBCUTANEOUS
  Administered 2015-11-14 (×2): 1 [IU] via SUBCUTANEOUS
  Administered 2015-11-14 (×3): 2 [IU] via SUBCUTANEOUS

## 2015-11-04 NOTE — Progress Notes (Signed)
Pt is getting first unit of FFP at this point, there is order for 2, recent HB is 7.0, Vitals stable and no any complain of SOB and distress, will continue to monitor the patient.

## 2015-11-04 NOTE — Progress Notes (Signed)
Admitted pt from the ED assisted by the RN per stretcher with BT ongoing. Alert, oriented x4. Denies chest pain, not in respiratory distress.Placed on the telemetry with box no [id number]. Rogue Bussing was informed of this admission. Oriented to call bell and room with understanding. Son at the bedside.Will continue to monitor pt.   11/03/15 2315  Vitals  Temp 97.7 F (36.5 C)  Temp Source Oral  BP (!) 157/82 mmHg  BP Location Right Arm  BP Method Automatic  Patient Position (if appropriate) Sitting  Pulse Rate 72  Pulse Rate Source Dinamap  Resp 18  Oxygen Therapy  SpO2 99 %  O2 Device Room Air

## 2015-11-04 NOTE — Consult Note (Signed)
Referring Provider: Dr. Derrek Gu Primary Care Physician:  [name], MD Primary Gastroenterologist:  None (unassigned)  Reason for Consultation:  Heme positive stool and anemia  HPI: Ernest Lara is a 75 y.o. male admitted to the hospital last night through the emergency room because of a 2 week history of significant dyspnea on mild exertion.   He was found in the emergency room to have profound microcytic anemia, hemoglobin 4.6, MCV 62, ferritin 8, iron saturation 3%. He was Hemoccult positive and the stool was described as dark in color.  Of note, the patient is on Xarelto for paroxysmal atrial fibrillation, and had been using nonsteroidal anti-inflammatory drugs (Aleve every other night for the past couple of weeks, to help him sleep). However, the patient notes that he has actually had hard stools, not tarry stools, nor has he noted them to be particularly dark in color.  The patient denies any localizing GI symptoms such as reflux, dysphagia, abdominal pain, anorexia, involuntary weight loss, nausea, diarrhea, or overt melena. As noted, he has had a slight tendency toward constipation with firm stools but it does not sound as though there has been a dramatic change in bowel habits.  The patient denies any family history of GI tract illnesses such as colon cancer, although his wife had some sort of intra-abdominal malignancy. I see on the record where his mother is listed as having colon cancer, but the patient did not corroborate that.  6 months ago, the patient was already anemic with a hemoglobin of 10.5. Of note, he has chronic renal insufficiency, with a creatinine of 1.43 back in November, and a current creatinine of 2.9 (BUN 44).  The patient has never had endoscopic or colonoscopic evaluation.   Past Medical History  Diagnosis Date  . Essential hypertension   . Type 2 diabetes mellitus ([place])   . OSA (obstructive sleep apnea), pt with apnea during procedures 10/11/2011   . 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 Lovena Le 05/02/15  . Paroxysmal atrial fibrillation ([facility])   . History of cardiomyopathy     Nonischemic, possibly tachycardia mediated, LVEF 60-65% 04/2015  . CKD (chronic kidney disease) stage 3, GFR 30-59 ml/min     Past Surgical History  Procedure Laterality Date  . No past surgeries    . Tee without cardioversion  10/10/2011    Procedure: TRANSESOPHAGEAL ECHOCARDIOGRAM (TEE);  Surgeon: Sanda Klein, MD;  Location: [place];  Service: Cardiovascular;  Laterality: N/A;  . Cardioversion  10/11/2011    Procedure: CARDIOVERSION;  Surgeon: Leonie Man, MD;  Location: [place];  Service: Cardiovascular;  Laterality: N/A;  . Nuclear stress  11/01/2011    Severe global hypokinesis,dilated ventricle  . Ep implantable device N/A 05/02/2015    Procedure: BiV Pacemaker Insertion CRT-P;  Surgeon: Evans Lance, MD;  Location: [place] [facility];  Service: Cardiovascular;  Laterality: N/A;    Prior to Admission medications   Medication Sig Start Date End Date Taking? Authorizing Provider  benazepril (LOTENSIN) 40 MG tablet Take 1 tablet (40 mg total) by mouth daily. 05/08/15  Yes [name], MD  carvedilol (COREG) 25 MG tablet TAKE A HALF TABLET (12.5 MG) EVERY MORNING AND A WHOLE TABLET (25 MG) EVERY EVENING 08/25/15  Yes [name], MD  doxazosin (CARDURA) 8 MG tablet TAKE 1 TABLET (8 MG TOTAL) BY MOUTH AT BEDTIME. 07/20/15  Yes [name], MD  furosemide (LASIX) 80 MG tablet TAKE 1 TABLET (80 MG  TOTAL) BY MOUTH DAILY. 08/01/15  Yes [name], MD  metFORMIN (GLUCOPHAGE-XR) 750 MG 24 hr tablet Take 750 mg by mouth daily with breakfast.  02/20/15  Yes Historical Provider, MD  naproxen sodium (ANAPROX) 220 MG tablet Take 220 mg by mouth 2 (two) times daily as needed (moderate pain).    Yes Historical Provider, MD  rosuvastatin (CRESTOR) 20 MG tablet TAKE 1 TABLET (20 MG TOTAL) BY MOUTH AT BEDTIME. 10/23/15  Yes Evans Lance,  MD  sitaGLIPtin (JANUVIA) 100 MG tablet Take 100 mg by mouth daily.   Yes Historical Provider, MD  XARELTO 20 MG TABS tablet TAKE 1 TABLET BY MOUTH DAILY Patient taking differently: TAKE 1 TABLET BY MOUTH DAILY IN EVENING 05/19/15  Yes [name], MD  potassium chloride (KLOR-CON M10) 10 MEQ tablet Take 2 tablets (20 mEq total) by mouth once. 05/08/15   Sanda Klein, MD    Current Facility-Administered Medications  Medication Dose Route Frequency Provider Last Rate Last Dose  . acetaminophen (TYLENOL) tablet 650 mg  650 mg Oral Q6H PRN Norval Morton, MD   650 mg at 11/04/15 1342   Or  . acetaminophen (TYLENOL) suppository 650 mg  650 mg Rectal Q6H PRN Norval Morton, MD      . albuterol (PROVENTIL) (2.5 MG/3ML) 0.083% nebulizer solution 2.5 mg  2.5 mg Nebulization Q2H PRN Rondell A Tamala Julian, MD      . carvedilol (COREG) tablet 12.5 mg  12.5 mg Oral q morning - 10a Norval Morton, MD   12.5 mg at 11/04/15 0903  . doxazosin (CARDURA) tablet 8 mg  8 mg Oral QHS Norval Morton, MD   8 mg at 11/04/15 0008  . insulin aspart (novoLOG) injection 0-9 Units  0-9 Units Subcutaneous Q4H Norval Morton, MD   2 Units at 11/04/15 1714  . ondansetron (ZOFRAN) tablet 4 mg  4 mg Oral Q6H PRN Norval Morton, MD       Or  . ondansetron (ZOFRAN) injection 4 mg  4 mg Intravenous Q6H PRN Norval Morton, MD      . pantoprazole (PROTONIX) 80 mg in sodium chloride 0.9 % 250 mL (0.32 mg/mL) infusion  8 mg/hr Intravenous Continuous Norval Morton, MD 25 mL/hr at 11/04/15 1448 8 mg/hr at 11/04/15 1448  . [START ON 11/07/2015] pantoprazole (PROTONIX) injection 40 mg  40 mg Intravenous Q12H Rondell A Smith, MD      . rosuvastatin (CRESTOR) tablet 20 mg  20 mg Oral q1800 Rondell A Tamala Julian, MD   20 mg at 11/04/15 1715  . zolpidem (AMBIEN) tablet 5 mg  5 mg Oral QHS PRN Rebecka Apley, RPH   5 mg at 11/04/15 0001    Allergies as of 11/03/2015 - Review Complete 11/03/2015  Allergen Reaction Noted  . Amiodarone  Shortness Of Breath 10/13/2011    Family History  Problem Relation Age of Onset  . Colon cancer Mother   . Kidney disease Father     ESRF/HD, deceased    Social History   Social History  . Marital Status: Married    Spouse Name: N/A  . Number of Children: N/A  . Years of Education: N/A   Occupational History  . Not on file.   Social History Main Topics  . Smoking status: Never Smoker   . Smokeless tobacco: Never Used  . Alcohol Use: No  . Drug Use: No  . Sexual Activity: Not Currently   Other Topics Concern  .  Not on file   Social History Narrative    Review of Systems: Negative for skin rashes, other than an irritated area on his back where he always sits in a hard back chair. Negative for lymphadenopathy, exertional chest pain (even with his severe anemia), or syncope. No orthopnea, no chronic cough, but did have significant exertional dyspnea over the past 2 weeks, when it became difficult for him to take his normal half hour walk which she does 3 times a week. No urinary symptoms, no significant joint pains.  Physical Exam: Vital signs in last 24 hours: Temp:  [97.7 F (36.5 C)-99 F (37.2 C)] 98 F (36.7 C) (05/27 1608) Pulse Rate:  [62-77] 66 (05/27 1608) Resp:  [16-22] 18 (05/27 1608) BP: (99-157)/(42-82) 120/54 mmHg (05/27 1608) SpO2:  [96 %-100 %] 98 % (05/27 1608) Weight:  [117.255 kg (258 lb 8 oz)] 117.255 kg (258 lb 8 oz) (05/26 1905) Last BM Date: 11/03/15 General:   Alert,  Well-developed, well-nourished, pleasant and cooperative in NAD Head:  Normocephalic and atraumatic. Eyes:  Sclera clear, no icterus.   Conjunctiva slightly pale. Mouth:   No ulcerations or lesions.  Oropharynx pink & moist. Neck:   No masses or thyromegaly. Lungs:  Clear throughout to auscultation.   No wheezes, crackles, or rhonchi. No evident respiratory distress. Heart:   Regular rate and rhythm; no murmurs, clicks, rubs,  or gallops. Abdomen:  Soft, nontender,  nontympanitic, and nondistended. No masses, hepatosplenomegaly or ventral hernias noted. Msk:   Symmetrical without gross deformities. Pulses:  Normal radial pulse is noted. Extremities:   Without clubbing, cyanosis, or edema. Neurologic:  Alert and coherent;  grossly normal neurologically. Skin:  Intact without significant lesions or rashes. Cervical Nodes:  No significant cervical adenopathy. Psych:   Alert and cooperative. Normal mood and affect.  Intake/Output from previous day: 05/26 0701 - 05/27 0700 In: 1212.8 [I.V.:150.3; Blood:1062.5] Out: 300 [Urine:300] Intake/Output this shift: Total I/O In: 1767.5 [P.O.:1080; I.V.:65; Blood:622.5] Out: 800 [Urine:800]  Lab Results:  Recent Labs  11/03/15 1913 11/03/15 1956 11/04/15 0950  WBC 6.2 6.2 6.1  HGB 4.6* 4.6* 7.0*  HCT 16.2* 15.6* 23.3*  PLT 309 331 260   BMET  Recent Labs  11/03/15 1913  NA 135  K 4.1  CL 99*  CO2 23  GLUCOSE 279*  BUN 44*  CREATININE 2.92*  CALCIUM 9.9   LFT No results for input(s): PROT, ALBUMIN, AST, ALT, ALKPHOS, BILITOT, BILIDIR, IBILI in the last 72 hours. PT/INR  Recent Labs  11/03/15 2209 11/04/15 1239  LABPROT 36.9* 23.1*  INR 3.85* 2.07*    Studies/Results: Dg Chest 2 View  11/03/2015  CLINICAL DATA:  Shortness of breath. EXAM: CHEST  2 VIEW COMPARISON:  May 03, 2015. FINDINGS: The heart size and mediastinal contours are within normal limits. Both lungs are clear. No pneumothorax or pleural effusion is noted. Left-sided pacemaker is unchanged in position. The visualized skeletal structures are unremarkable. IMPRESSION: No active cardiopulmonary disease. Electronically Signed   By: Marijo Conception, M.D.   On: 11/03/2015 19:30    Impression: 1. Progressive anemia with documented iron deficiency but possibly also element of anemia of chronic disease in view of chronic renal insufficiency. 2. Hemoccult-positive stool 3. Recent nonsteroidal anti-inflammatory drug  exposure 4. Chronic anticoagulation (Xarelto) 5. Notation is made on chart of obstructive sleep apnea with apnea during procedures, we will need cautious sedation  Plan: 1. Endoscopy and colonoscopy tomorrow. Petra Kuba, purpose, risks, and alternatives discussed with patient  and son at bedside. They are agreeable. 2. Cardiac clearance for endoscopic procedure noted. We will nonetheless need cautious sedation because of sleep apnea. The patient is getting additional blood today which should bring his hemoglobin close to 8 which should be sufficient for the procedure. 3. Further management to depend on the findings of tomorrow studies. If both are negative, we might want to consider a capsule endoscopy.   LOS: 1 day   Corlette Ciano V  11/04/2015, 6:55 PM   Pager [phone] If no answer or after 5 PM call [phone]

## 2015-11-04 NOTE — Consult Note (Signed)
Requesting provider: Dr. Reyne Dumas Primary cardiologist: Dr. Sanda Klein Consulting cardiologist: Dr. Satira Sark  Reason for consultation: Atrial fibrillation with severe anemia, preprocedure assessment  Clinical Summary Ernest Lara is a 75 y.o.male presently admitted to the hospitalist service with history of progressive weakness over the last few weeks and documentation of severe anemia with hemoglobin down to 4.6 associated with mild hypotension. He has been on Xarelto as an outpatient for stroke prophylaxis with paroxysmal atrial fibrillation and CHADSVASC score of 3. Stool is guaiac positive and he has been seen by GI with plan to undergo endoscopy following initial stabilization on PPI and packed red cell transfusions. Xarelto has been held. He had also been using Aleve every other day.  At this time he is maintaining sinus rhythm, continues on carvedilol. His most recent echocardiogram from November 2016 revealed LVEF of 60-65%. He does not endorse any palpitations or chest pain, has had no syncope. No orthopnea or PND.   Allergies  Allergen Reactions  . Amiodarone Shortness Of Breath    Pulmonary toxicity with amiodarone    Medications Scheduled Medications: . carvedilol  12.5 mg Oral q morning - 10a  . doxazosin  8 mg Oral QHS  . insulin aspart  0-9 Units Subcutaneous Q4H  . [START ON 11/07/2015] pantoprazole (PROTONIX) IV  40 mg Intravenous Q12H  . rosuvastatin  20 mg Oral q1800    Infusions: . pantoprozole (PROTONIX) infusion 8 mg/hr (11/04/15 0033)    PRN Medications: acetaminophen **OR** acetaminophen, albuterol, ondansetron **OR** ondansetron (ZOFRAN) IV, zolpidem   Past Medical History  Diagnosis Date  . Essential hypertension   . Type 2 diabetes mellitus ([place])   . OSA (obstructive sleep apnea), pt with apnea during procedures 10/11/2011  . 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 Lovena Le 05/02/15  . Paroxysmal atrial  fibrillation ([facility])   . History of cardiomyopathy     Nonischemic, possibly tachycardia mediated, LVEF 60-65% 04/2015  . CKD (chronic kidney disease) stage 3, GFR 30-59 ml/min     Past Surgical History  Procedure Laterality Date  . No past surgeries    . Tee without cardioversion  10/10/2011    Procedure: TRANSESOPHAGEAL ECHOCARDIOGRAM (TEE);  Surgeon: Sanda Klein, MD;  Location: [place];  Service: Cardiovascular;  Laterality: N/A;  . Cardioversion  10/11/2011    Procedure: CARDIOVERSION;  Surgeon: Leonie Man, MD;  Location: [place];  Service: Cardiovascular;  Laterality: N/A;  . Nuclear stress  11/01/2011    Severe global hypokinesis,dilated ventricle  . Ep implantable device N/A 05/02/2015    Procedure: BiV Pacemaker Insertion CRT-P;  Surgeon: Evans Lance, MD;  Location: [place] [facility];  Service: Cardiovascular;  Laterality: N/A;    Family History  Problem Relation Age of Onset  . Colon cancer Mother   . Kidney disease Father     ESRF/HD, deceased    Social History Mr. Wambach reports that he has never smoked. He has never used smokeless tobacco. Mr. Lecher reports that he does not drink alcohol.  Review of Systems Complete review of systems negative except as otherwise outlined in the clinical summary and also the following. Recent generalized weakness and fatigue. Typically NYHA class II dyspnea. No abdominal pain. No obvious melena or hematochezia. States that stools have been somewhat firm.  Physical Examination Blood pressure 106/55, pulse 70, temperature 99 F (37.2 C), temperature source Oral, resp. rate 18, height 6\' 3"  (1.905 m), weight 258 lb 8  oz (117.255 kg), SpO2 99 %.  Intake/Output Summary (Last 24 hours) at 11/04/15 1306 Last data filed at 11/04/15 0900  Gross per 24 hour  Intake 1900.33 ml  Output    950 ml  Net 950.33 ml   Telemetry: Ventricular paced rhythm.  Gen.: Patient in no distress. HEENT: Conjunctiva and lids normal, oropharynx  clear. Neck: Supple, no elevated JVP or carotid bruits, no thyromegaly. Lungs: Clear to auscultation, nonlabored breathing at rest. Cardiac: Regular rate and rhythm, no S3 or significant systolic murmur, no pericardial rub. Abdomen: Soft, nontender, bowel sounds present, no guarding or rebound. Extremities: No pitting edema, distal pulses 2+. Skin: Warm and dry. Musculoskeletal: No kyphosis. Neuropsychiatric: Alert and oriented x3, affect grossly appropriate.  Lab Results  Basic Metabolic Panel:  Recent Labs Lab 11/03/15 1913  NA 135  K 4.1  CL 99*  CO2 23  GLUCOSE 279*  BUN 44*  CREATININE 2.92*  CALCIUM 9.9    CBC:  Recent Labs Lab 11/03/15 1913 11/03/15 1956 11/04/15 0950  WBC 6.2 6.2 6.1  HGB 4.6* 4.6* 7.0*  HCT 16.2* 15.6* 23.3*  MCV 61.6* 61.2* 67.3*  PLT 309 331 260    Cardiac Enzymes:  Recent Labs Lab 11/03/15 1913  TROPONINI <0.03    BNP: 215  ECG Sinus rhythm with ventricular pacing.  Imaging  Echocardiogram 05/02/2015: Study Conclusions  - Left ventricle: The cavity size was normal. Wall thickness was  normal. Systolic function was normal. The estimated ejection  fraction was in the range of 60% to 65%. Wall motion was normal;  there were no regional wall motion abnormalities. - Aortic valve: There was mild regurgitation. - Mitral valve: There was mild regurgitation. - Left atrium: The atrium was moderately dilated. - Right atrium: The atrium was moderately dilated. - Pulmonary arteries: Systolic pressure was mildly increased. PA  peak pressure: 34 mm Hg (S).  Chest x-ray 11/03/2015: FINDINGS: The heart size and mediastinal contours are within normal limits. Both lungs are clear. No pneumothorax or pleural effusion is noted. Left-sided pacemaker is unchanged in position. The visualized skeletal structures are unremarkable.  IMPRESSION: No active cardiopulmonary disease.  Impression  1. Severe anemia with hemoglobin  down to 4.6, GI bleed suspected with plan for endoscopy per Dr. [name] (not sure if with moderate sedation or deeper anesthesia). From a cardiac perspective he should be able to proceed without further cardiac specific testing. Agree with holding Xarelto at this time pending further evaluation of GI tract.  2. Paroxysmal atrial fibrillation with CHADVASC score of 3. He continues on Coreg and is in sinus rhythm now with ventricular pacing.  3. History of nonischemic cardiomyopathy, possibly tachycardia mediated, normalization of LVEF as of November 2016 in the range of 60-65%. He has been on Coreg and Lotensin as an outpatient.  4. Acute on chronic renal insufficiency with CKD stage III. Creatinine of 2.9. Possibly related to relative hypovolemia with GI bleed and also mild hypotension, ATN. ACE inhibitor has been held.  5. Complete heart block status post [employer] CRT-P, followed by Dr. Lovena Le.  Recommendations  Discussed with patient and son. Agree with holding Xarelto and ACE inhibitor pending further workup. Would continue Coreg. He should be able to proceed with endoscopy at an acceptable periprocedural cardiac risk. Our service will follow with you.  Satira Sark, M.D., F.A.C.C.

## 2015-11-04 NOTE — Care Management Obs Status (Signed)
[place] NOTIFICATION   Patient Details  Name: Ernest Lara MRN: [id number] Date of Birth: [birth year]   Medicare Observation Status Notification Given:  Yes MOON/[id number] given to and signed by pt; copy to [place] for scanning into medical records   Erenest Rasher, RN 11/04/2015, 4:47 PM

## 2015-11-04 NOTE — Progress Notes (Signed)
[facility] PROGRESS NOTE  Ernest Lara [id number] DOB: [birth year] DOA: 11/03/2015   PCP: [name], MD     Assessment/Plan: Principal Problem:   GI bleed Active Problems:   DM (diabetes mellitus), type 2    HTN (hypertension)   PAF (paroxysmal atrial fibrillation), recent DCCV 10/11/11 to SR.   Hyperlipidemia   Complete heart block (HCC)   Acute blood loss anemia   Acute kidney injury superimposed on chronic kidney disease ([place])   75 y.o. male with medical history significant of HTN, HLD, PAF on xarelto, Systolic CHF EF [id number] in 04/2015, CKD stage III, diabetes mellitus type 2, s/p PM; who presents with approximately 3 weeks of progressively worsening weakness. Upon admission patient was seen to be afebrile with blood pressure initially noted to be as low as 99/53. All other vital signs within normal limits. Lab work revealed a hemoglobin of 4.6. Admitted for workup of his anemia and weakness  Assessment and plan  Acute GI bleed:    guaiac positive on rectal exam, but no note of gross blood on rectal exam. ED physician consulted gastroenterology, [name]. Suspect patient could be having acute upper GI bleed given history of Aleve use., Patient on Xarelto at home , INR supratherapeutic, transfuse 2 units of fresh frozen plasma -Continue telemetry - NPO except meds Status post transfusion of 3 units of packed red blood cells, repeat CBC Continue Protonix drip, Cardiology consult for preop clearance     Acute blood loss anemia: Patient Presents with weakness. Gives history of every other day Aleve/on Xarelto  .baseline hemoglobin around 10.5-11.  Reticulocyte count does not appear to be elevated at 57.6.  Patient noted to have a total of 3 units of packed red blood cells and 2 units of FFP - Transfuse as needed to keep a goal hemoglobin >8 - Check electrolytes in a.m. of ionized calcium  Iron deficiency Start Ferrous sulfate supplementation when able    Paroxysmal Atrial fibrillation/ flutter on chronic anticoagulation: Patient currently in sinus rhythm heart rates controlled less than 100.  - Held Xarelto - Continue Coreg  Acute kidney injury on chronic kidney disease stage III: Baseline creatinine noted to be somewhere around 1.4-1.9 upon review of previous admissions. However, creatinine acutely elevated to 2.92 with a BUN of 44 on admission. Suspect acutely worsened secondary to hypovolemic state with acute anemia. - Repeat BMP in a.m. - Held nephrotoxic agents  Systolic congestive heart failure last EF 60-65%:  - Strict I&Os and daily weights - Continuous pulse oximetry with nasal cannula oxygen as needed to keep O2 sats greater than 92% - IV Lasix as needed for signs of fluid overload ongoing blood transfusions    Essential hypertension - Held benazepril and lasix - Continue Coreg   Diabetes mellitus type 2 with hyperglycemia - Held metformin and Junuvia, check hemoglobin A1c - Hypoglycemic protocols, SSI    S/p pacemaker: Implantation of a [employer] CRT PPM on 05/02/15 by [name] for symptomatic bradycardia.  Hyperlipidemia - Continue Crestor   BPH - Continue doxazosin  Insomnia - Benadryl prn   DVT prophylaxsis SCDs  Code Status:  Full code     Family Communication: Discussed in detail with the patient, all imaging results, lab results explained to the patient   Disposition Plan:  Anticipate discharge in 2-3 days     Consultants:  Gastroenterology  Cardiology  Procedures:  None  Antibiotics: Anti-infectives    None  HPI/Subjective: No further episodes of melena or hematochezia since admission   Objective: Filed Vitals:   11/04/15 0437 11/04/15 0511 11/04/15 0710 11/04/15 0900  BP: 117/50 119/55 129/54 120/56  Pulse: 65 68 62   Temp: 98.2 F (36.8 C) 98.7 F (37.1 C) 97.8 F (36.6 C)   TempSrc: Oral Oral Oral   Resp: 16 18 16 16   Height:      Weight:       SpO2: 100% 100% 100% 100%    Intake/Output Summary (Last 24 hours) at 11/04/15 0927 Last data filed at 11/04/15 0900  Gross per 24 hour  Intake 1900.33 ml  Output    950 ml  Net 950.33 ml     General exam: Appears calm and comfortable  Respiratory system: Clear to auscultation. Respiratory effort normal. Cardiovascular system: S1 & S2 heard, RRR. No JVD, murmurs, rubs, gallops or clicks. No pedal edema. Gastrointestinal system: Abdomen is nondistended, soft and nontender. No organomegaly or masses felt. Normal bowel sounds heard. Central nervous system: Alert and oriented. No focal neurological deficits. Extremities: Symmetric 5 x 5 power. Skin: No rashes, lesions or ulcers Psychiatry: Judgement and insight appear normal. Mood & affect appropriate.     Data Reviewed: I have personally reviewed following labs and imaging studies  Micro Results No results found for this or any previous visit (from the past 240 hour(s)).  Radiology Reports Dg Chest 2 View  11/03/2015  CLINICAL DATA:  Shortness of breath. EXAM: CHEST  2 VIEW COMPARISON:  May 03, 2015. FINDINGS: The heart size and mediastinal contours are within normal limits. Both lungs are clear. No pneumothorax or pleural effusion is noted. Left-sided pacemaker is unchanged in position. The visualized skeletal structures are unremarkable. IMPRESSION: No active cardiopulmonary disease. Electronically Signed   By: Marijo Conception, M.D.   On: 11/03/2015 19:30     CBC  Recent Labs Lab 11/03/15 1913 11/03/15 1956  WBC 6.2 6.2  HGB 4.6* 4.6*  HCT 16.2* 15.6*  PLT 309 331  MCV 61.6* 61.2*  MCH 17.5* 18.0*  MCHC 28.4* 29.5*  RDW 17.1* 17.1*    Chemistries   Recent Labs Lab 11/03/15 1913  NA 135  K 4.1  CL 99*  CO2 23  GLUCOSE 279*  BUN 44*  CREATININE 2.92*  CALCIUM 9.9   ------------------------------------------------------------------------------------------------------------------ estimated creatinine  clearance is 30.6 mL/min (by C-G formula based on Cr of 2.92). ------------------------------------------------------------------------------------------------------------------ No results for input(s): HGBA1C in the last 72 hours. ------------------------------------------------------------------------------------------------------------------ No results for input(s): CHOL, HDL, LDLCALC, TRIG, CHOLHDL, LDLDIRECT in the last 72 hours. ------------------------------------------------------------------------------------------------------------------ No results for input(s): TSH, T4TOTAL, T3FREE, THYROIDAB in the last 72 hours.  Invalid input(s): FREET3 ------------------------------------------------------------------------------------------------------------------  Recent Labs  11/03/15 1956  VITAMINB12 315  FOLATE 18.0  FERRITIN 8*  TIBC 466*  IRON 16*  RETICCTPCT 2.2    Coagulation profile  Recent Labs Lab 11/03/15 2209  INR 3.85*    No results for input(s): DDIMER in the last 72 hours.  Cardiac Enzymes  Recent Labs Lab 11/03/15 1913  TROPONINI <0.03   ------------------------------------------------------------------------------------------------------------------ Invalid input(s): POCBNP   CBG:  Recent Labs Lab 11/03/15 2336 11/04/15 0834  GLUCAP 204* 257*       Studies: Dg Chest 2 View  11/03/2015  CLINICAL DATA:  Shortness of breath. EXAM: CHEST  2 VIEW COMPARISON:  May 03, 2015. FINDINGS: The heart size and mediastinal contours are within normal limits. Both lungs are clear. No pneumothorax or pleural effusion is noted. Left-sided pacemaker is  unchanged in position. The visualized skeletal structures are unremarkable. IMPRESSION: No active cardiopulmonary disease. Electronically Signed   By: Marijo Conception, M.D.   On: 11/03/2015 19:30      Lab Results  Component Value Date   HGBA1C 7.8* 05/01/2015   HGBA1C 7.5* 10/08/2011   Lab Results   Component Value Date   LDLCALC 15 10/09/2011   CREATININE 2.92* 11/03/2015       Scheduled Meds: . carvedilol  12.5 mg Oral q morning - 10a  . doxazosin  8 mg Oral QHS  . insulin aspart  0-9 Units Subcutaneous Q4H  . [START ON 11/07/2015] pantoprazole (PROTONIX) IV  40 mg Intravenous Q12H  . rosuvastatin  20 mg Oral q1800   Continuous Infusions: . pantoprozole (PROTONIX) infusion 8 mg/hr (11/04/15 0033)     LOS: 1 day    Time spent: >30 MINS    [facility]  [facility] Pager [phone]. If 7PM-7AM, please contact night-coverage at [email or web address], password [facility] 11/04/2015, 9:27 AM  LOS: 1 day

## 2015-11-05 ENCOUNTER — Observation Stay (HOSPITAL_COMMUNITY): Payer: Medicare Other

## 2015-11-05 ENCOUNTER — Encounter (HOSPITAL_COMMUNITY)
Admission: EM | Disposition: A | Discharge status: Home / Self Care | Payer: Self-pay | Source: Home / Self Care | Attending: Internal Medicine

## 2015-11-05 ENCOUNTER — Encounter (HOSPITAL_COMMUNITY): Payer: Self-pay | Admitting: *Deleted

## 2015-11-05 DIAGNOSIS — C184 Malignant neoplasm of transverse colon: Secondary | ICD-10-CM | POA: Diagnosis present

## 2015-11-05 DIAGNOSIS — Z8711 Personal history of peptic ulcer disease: Secondary | ICD-10-CM | POA: Diagnosis not present

## 2015-11-05 DIAGNOSIS — G4700 Insomnia, unspecified: Secondary | ICD-10-CM | POA: Diagnosis present

## 2015-11-05 DIAGNOSIS — K2961 Other gastritis with bleeding: Secondary | ICD-10-CM | POA: Diagnosis not present

## 2015-11-05 DIAGNOSIS — Z7901 Long term (current) use of anticoagulants: Secondary | ICD-10-CM | POA: Diagnosis not present

## 2015-11-05 DIAGNOSIS — Z791 Long term (current) use of non-steroidal anti-inflammatories (NSAID): Secondary | ICD-10-CM | POA: Diagnosis not present

## 2015-11-05 DIAGNOSIS — K921 Melena: Secondary | ICD-10-CM | POA: Diagnosis present

## 2015-11-05 DIAGNOSIS — Z888 Allergy status to other drugs, medicaments and biological substances status: Secondary | ICD-10-CM | POA: Diagnosis not present

## 2015-11-05 DIAGNOSIS — I5042 Chronic combined systolic (congestive) and diastolic (congestive) heart failure: Secondary | ICD-10-CM | POA: Diagnosis present

## 2015-11-05 DIAGNOSIS — N289 Disorder of kidney and ureter, unspecified: Secondary | ICD-10-CM | POA: Diagnosis not present

## 2015-11-05 DIAGNOSIS — N179 Acute kidney failure, unspecified: Secondary | ICD-10-CM | POA: Diagnosis not present

## 2015-11-05 DIAGNOSIS — I10 Essential (primary) hypertension: Secondary | ICD-10-CM | POA: Diagnosis not present

## 2015-11-05 DIAGNOSIS — E1165 Type 2 diabetes mellitus with hyperglycemia: Secondary | ICD-10-CM | POA: Diagnosis present

## 2015-11-05 DIAGNOSIS — C189 Malignant neoplasm of colon, unspecified: Secondary | ICD-10-CM | POA: Diagnosis not present

## 2015-11-05 DIAGNOSIS — D509 Iron deficiency anemia, unspecified: Secondary | ICD-10-CM | POA: Diagnosis not present

## 2015-11-05 DIAGNOSIS — I480 Paroxysmal atrial fibrillation: Secondary | ICD-10-CM | POA: Diagnosis present

## 2015-11-05 DIAGNOSIS — I442 Atrioventricular block, complete: Secondary | ICD-10-CM | POA: Diagnosis not present

## 2015-11-05 DIAGNOSIS — N183 Chronic kidney disease, stage 3 (moderate): Secondary | ICD-10-CM | POA: Diagnosis present

## 2015-11-05 DIAGNOSIS — G4733 Obstructive sleep apnea (adult) (pediatric): Secondary | ICD-10-CM | POA: Diagnosis present

## 2015-11-05 DIAGNOSIS — E118 Type 2 diabetes mellitus with unspecified complications: Secondary | ICD-10-CM | POA: Diagnosis not present

## 2015-11-05 DIAGNOSIS — K922 Gastrointestinal hemorrhage, unspecified: Secondary | ICD-10-CM | POA: Diagnosis not present

## 2015-11-05 DIAGNOSIS — E669 Obesity, unspecified: Secondary | ICD-10-CM | POA: Diagnosis present

## 2015-11-05 DIAGNOSIS — N170 Acute kidney failure with tubular necrosis: Secondary | ICD-10-CM | POA: Diagnosis present

## 2015-11-05 DIAGNOSIS — E785 Hyperlipidemia, unspecified: Secondary | ICD-10-CM | POA: Diagnosis present

## 2015-11-05 DIAGNOSIS — K2951 Unspecified chronic gastritis with bleeding: Secondary | ICD-10-CM | POA: Diagnosis not present

## 2015-11-05 DIAGNOSIS — K5730 Diverticulosis of large intestine without perforation or abscess without bleeding: Secondary | ICD-10-CM | POA: Diagnosis present

## 2015-11-05 DIAGNOSIS — Z800 Family history of malignant neoplasm of digestive organs: Secondary | ICD-10-CM | POA: Diagnosis not present

## 2015-11-05 DIAGNOSIS — D649 Anemia, unspecified: Secondary | ICD-10-CM | POA: Diagnosis not present

## 2015-11-05 DIAGNOSIS — Z7984 Long term (current) use of oral hypoglycemic drugs: Secondary | ICD-10-CM | POA: Diagnosis not present

## 2015-11-05 DIAGNOSIS — D62 Acute posthemorrhagic anemia: Secondary | ICD-10-CM | POA: Diagnosis not present

## 2015-11-05 DIAGNOSIS — R3915 Urgency of urination: Secondary | ICD-10-CM | POA: Diagnosis not present

## 2015-11-05 DIAGNOSIS — D500 Iron deficiency anemia secondary to blood loss (chronic): Secondary | ICD-10-CM | POA: Diagnosis not present

## 2015-11-05 DIAGNOSIS — D374 Neoplasm of uncertain behavior of colon: Secondary | ICD-10-CM | POA: Diagnosis not present

## 2015-11-05 DIAGNOSIS — R531 Weakness: Secondary | ICD-10-CM | POA: Diagnosis present

## 2015-11-05 DIAGNOSIS — N189 Chronic kidney disease, unspecified: Secondary | ICD-10-CM | POA: Diagnosis not present

## 2015-11-05 DIAGNOSIS — K264 Chronic or unspecified duodenal ulcer with hemorrhage: Secondary | ICD-10-CM | POA: Diagnosis not present

## 2015-11-05 DIAGNOSIS — Z6832 Body mass index (BMI) 32.0-32.9, adult: Secondary | ICD-10-CM | POA: Diagnosis not present

## 2015-11-05 DIAGNOSIS — K6389 Other specified diseases of intestine: Secondary | ICD-10-CM | POA: Diagnosis not present

## 2015-11-05 DIAGNOSIS — K625 Hemorrhage of anus and rectum: Secondary | ICD-10-CM | POA: Diagnosis not present

## 2015-11-05 DIAGNOSIS — I130 Hypertensive heart and chronic kidney disease with heart failure and stage 1 through stage 4 chronic kidney disease, or unspecified chronic kidney disease: Secondary | ICD-10-CM | POA: Diagnosis present

## 2015-11-05 DIAGNOSIS — Z79899 Other long term (current) drug therapy: Secondary | ICD-10-CM | POA: Diagnosis not present

## 2015-11-05 DIAGNOSIS — D127 Benign neoplasm of rectosigmoid junction: Secondary | ICD-10-CM | POA: Diagnosis present

## 2015-11-05 DIAGNOSIS — I429 Cardiomyopathy, unspecified: Secondary | ICD-10-CM | POA: Diagnosis present

## 2015-11-05 DIAGNOSIS — N400 Enlarged prostate without lower urinary tract symptoms: Secondary | ICD-10-CM | POA: Diagnosis present

## 2015-11-05 DIAGNOSIS — Z950 Presence of cardiac pacemaker: Secondary | ICD-10-CM | POA: Diagnosis not present

## 2015-11-05 DIAGNOSIS — K5900 Constipation, unspecified: Secondary | ICD-10-CM | POA: Diagnosis present

## 2015-11-05 DIAGNOSIS — K269 Duodenal ulcer, unspecified as acute or chronic, without hemorrhage or perforation: Secondary | ICD-10-CM | POA: Diagnosis present

## 2015-11-05 DIAGNOSIS — I5032 Chronic diastolic (congestive) heart failure: Secondary | ICD-10-CM | POA: Diagnosis not present

## 2015-11-05 DIAGNOSIS — E1122 Type 2 diabetes mellitus with diabetic chronic kidney disease: Secondary | ICD-10-CM | POA: Diagnosis present

## 2015-11-05 DIAGNOSIS — Z841 Family history of disorders of kidney and ureter: Secondary | ICD-10-CM | POA: Diagnosis not present

## 2015-11-05 HISTORY — PX: COLONOSCOPY: SHX5424

## 2015-11-05 HISTORY — PX: ESOPHAGOGASTRODUODENOSCOPY: SHX5428

## 2015-11-05 LAB — CBC
HCT: 24.2 % — ABNORMAL LOW (ref 39.0–52.0)
Hemoglobin: 7.4 g/dL — ABNORMAL LOW (ref 13.0–17.0)
MCH: 21.2 pg — ABNORMAL LOW (ref 26.0–34.0)
MCHC: 30.6 g/dL (ref 30.0–36.0)
MCV: 69.3 fL — AB (ref 78.0–100.0)
PLATELETS: 251 10*3/uL (ref 150–400)
RBC: 3.49 MIL/uL — AB (ref 4.22–5.81)
RDW: 21.8 % — AB (ref 11.5–15.5)
WBC: 6.3 10*3/uL (ref 4.0–10.5)

## 2015-11-05 LAB — GLUCOSE, CAPILLARY
GLUCOSE-CAPILLARY: 148 mg/dL — AB (ref 65–99)
GLUCOSE-CAPILLARY: 153 mg/dL — AB (ref 65–99)
GLUCOSE-CAPILLARY: 164 mg/dL — AB (ref 65–99)
GLUCOSE-CAPILLARY: 203 mg/dL — AB (ref 65–99)
Glucose-Capillary: 136 mg/dL — ABNORMAL HIGH (ref 65–99)
Glucose-Capillary: 148 mg/dL — ABNORMAL HIGH (ref 65–99)

## 2015-11-05 LAB — PREPARE FRESH FROZEN PLASMA
UNIT DIVISION: 0
UNIT DIVISION: 0

## 2015-11-05 LAB — COMPREHENSIVE METABOLIC PANEL
ALT: 14 U/L — AB (ref 17–63)
AST: 18 U/L (ref 15–41)
Albumin: 3.1 g/dL — ABNORMAL LOW (ref 3.5–5.0)
Alkaline Phosphatase: 60 U/L (ref 38–126)
Anion gap: 9 (ref 5–15)
BUN: 37 mg/dL — ABNORMAL HIGH (ref 6–20)
CHLORIDE: 105 mmol/L (ref 101–111)
CO2: 25 mmol/L (ref 22–32)
CREATININE: 2.6 mg/dL — AB (ref 0.61–1.24)
Calcium: 9.8 mg/dL (ref 8.9–10.3)
GFR, EST AFRICAN AMERICAN: 26 mL/min — AB (ref >60)
GFR, EST NON AFRICAN AMERICAN: 23 mL/min — AB (ref >60)
Glucose, Bld: 137 mg/dL — ABNORMAL HIGH (ref 65–99)
POTASSIUM: 3.6 mmol/L (ref 3.5–5.1)
SODIUM: 139 mmol/L (ref 135–145)
Total Bilirubin: 1.5 mg/dL — ABNORMAL HIGH (ref 0.3–1.2)
Total Protein: 6.3 g/dL — ABNORMAL LOW (ref 6.5–8.1)

## 2015-11-05 LAB — PROTIME-INR
INR: 1.54 — AB (ref 0.00–1.49)
PROTHROMBIN TIME: 18.6 s — AB (ref 11.6–15.2)

## 2015-11-05 LAB — PREPARE RBC (CROSSMATCH)

## 2015-11-05 SURGERY — EGD (ESOPHAGOGASTRODUODENOSCOPY)
Anesthesia: Moderate Sedation

## 2015-11-05 MED ORDER — BUTAMBEN-TETRACAINE-BENZOCAINE 2-2-14 % EX AERO
INHALATION_SPRAY | CUTANEOUS | Status: DC | PRN
  Administered 2015-11-05: 2 via TOPICAL

## 2015-11-05 MED ORDER — FENTANYL CITRATE (PF) 100 MCG/2ML IJ SOLN
INTRAMUSCULAR | Status: AC
  Filled 2015-11-05: qty 2

## 2015-11-05 MED ORDER — SODIUM CHLORIDE 0.9 % IV SOLN
Freq: Once | INTRAVENOUS | Status: AC
  Administered 2015-11-05: 13:00:00 via INTRAVENOUS

## 2015-11-05 MED ORDER — SUCRALFATE 1 G PO TABS
1.0000 g | ORAL_TABLET | Freq: Four times a day (QID) | ORAL | Status: DC
  Administered 2015-11-05 – 2015-11-09 (×17): 1 g via ORAL
  Filled 2015-11-05 (×19): qty 1

## 2015-11-05 MED ORDER — POLYETHYLENE GLYCOL 3350 17 G PO PACK
17.0000 g | PACK | Freq: Two times a day (BID) | ORAL | Status: DC
  Administered 2015-11-05: 17 g via ORAL
  Filled 2015-11-05 (×4): qty 1

## 2015-11-05 MED ORDER — FENTANYL CITRATE (PF) 100 MCG/2ML IJ SOLN
INTRAMUSCULAR | Status: DC | PRN
  Administered 2015-11-05 (×2): 12.5 ug via INTRAVENOUS

## 2015-11-05 MED ORDER — SPOT INK MARKER SYRINGE KIT
PACK | SUBMUCOSAL | Status: DC | PRN
  Administered 2015-11-05: 4 mL via SUBMUCOSAL

## 2015-11-05 MED ORDER — SODIUM CHLORIDE 0.9 % IV SOLN
INTRAVENOUS | Status: DC
  Administered 2015-11-05: 11:00:00 via INTRAVENOUS
  Administered 2015-11-05: 500 mL via INTRAVENOUS

## 2015-11-05 MED ORDER — PANTOPRAZOLE SODIUM 40 MG PO TBEC
40.0000 mg | DELAYED_RELEASE_TABLET | Freq: Two times a day (BID) | ORAL | Status: AC
  Administered 2015-11-05 – 2015-11-09 (×8): 40 mg via ORAL
  Filled 2015-11-05 (×8): qty 1

## 2015-11-05 MED ORDER — MIDAZOLAM HCL 10 MG/2ML IJ SOLN
INTRAMUSCULAR | Status: DC | PRN
  Administered 2015-11-05 (×5): 1 mg via INTRAVENOUS

## 2015-11-05 MED ORDER — MIDAZOLAM HCL 5 MG/ML IJ SOLN
INTRAMUSCULAR | Status: AC
  Filled 2015-11-05: qty 2

## 2015-11-05 MED ORDER — SPOT INK MARKER SYRINGE KIT
PACK | SUBMUCOSAL | Status: AC
  Filled 2015-11-05: qty 5

## 2015-11-05 NOTE — Progress Notes (Signed)
Endoscopy and colonoscopy were well tolerated.  The patient has a clean-based duodenal ulcer, and 2 colon masses (one of which looks malignant, and the other of which--in the proximal rectum--was not amenable to colonoscopic excision).  Patient and sons aware.  Recommend:  1. The above findings could certainly explain the patient's chronic anemia 2. I will obtain surgical consultation tomorrow. It might be appropriate to keep the patient on clear liquids and off anticoagulation, and to proceed to surgical resection of both of the colonic masses in the next several days.  See orders. Will discuss with Dr. Allyson Sabal.  Cleotis Nipper, M.D. Pager [phone] If no answer or after 5 PM call [phone]

## 2015-11-05 NOTE — Op Note (Signed)
[facility] Patient Name: Ernest Lara Procedure Date : 11/05/2015 MRN: [id number] Attending MD: Ronald Lobo , MD Date of Birth: 1940/06/20 CSN: [id number] Age: 75 Admit Type: Inpatient Procedure:                Colonoscopy Indications:              Heme positive stool, Iron deficiency anemia                            secondary to chronic blood loss in a patient on                            Aleve and Xarelto Providers:                Ronald Lobo, MD, Cleda Daub, RN, Cletis Athens,                            Technician Referring MD:              Medicines:                Fentanyl 25 micrograms IV, Midazolam 5 mg IV Complications:            No immediate complications. Estimated Blood Loss:     Estimated blood loss: 5 mL treated with placement                            of hemostatic clips. Procedure:                Pre-Anesthesia Assessment:                           - Prior to the procedure, a History and Physical                            was performed, and patient medications and                            allergies were reviewed. The patient's tolerance of                            previous anesthesia was also reviewed. The risks                            and benefits of the procedure and the sedation                            options and risks were discussed with the patient.                            All questions were answered, and informed consent                            was obtained. Prior Anticoagulants: The patient has  taken Xarelto (rivaroxaban), last dose was 2 days                            prior to procedure. ASA Grade Assessment: III - A                            patient with severe systemic disease. After                            reviewing the risks and benefits, the patient was                            deemed in satisfactory condition to undergo the                            procedure. The patient was  brought from his                            hospital room to the [facility] cone endoscopy unit.                            This procedure was performed immediately following                            his upper endoscopy.                           Cautious and conservative sedation was performed,                            because of a prior history of apnea related to                            obstructive sleep apnea during a TEE.                           After obtaining informed consent, the colonoscope                            was passed under direct vision. Throughout the                            procedure, the patient's blood pressure, pulse, and                            oxygen saturations were monitored continuously. The                            EC-[id number]) scope was introduced through                            the anus and advanced to the the cecum, identified  by appendiceal orifice and ileocecal valve. The                            colonoscopy was performed without difficulty. The                            patient tolerated the procedure well, specifically                            without any evidence of airway obstruction, apnea,                            or hypoxemia. The quality of the bowel preparation                            was good. The appendiceal orifice and the rectum                            were photographed. Scope In: 2:18:10 PM Scope Out: 3:34:35 PM Scope Withdrawal Time: 1 hour 10 minutes 7 seconds  Total Procedure Duration: 1 hour 16 minutes 25 seconds  Findings:      The digital rectal exam was normal. Pertinent negatives include normal       prostate (size, shape, and consistency).      A sessile, flat, pancake-like non-obstructing medium-sized mass with       central excavation and ulceration was found in the transverse colon. The       mass was non-circumferential; its diameter measured about 6 cm. No        bleeding was present. Biopsies were taken with a cold forceps for       histology--the lesion was very firm. Estimated blood loss was minimal.       For location marking, one hemostatic clip was successfully placed. Area       was tattooed with an injection of 2 mL of Spot (carbon black) proximally       and distally.      Three semi-pedunculated polyps were found in the transverse colon,       hepatic flexure and proximal ascending colon. The polyps were 8 to 14 mm       in size. These polyps were removed with a hot snare. Resection and       retrieval were complete. Estimated blood loss was minimal.      A 11 mm polyp was found in the hepatic flexure. The polyp was       semi-pedunculated. The polyp was removed with a cold snare. Resection       and retrieval were complete. Estimated blood loss from gradual oozing       totalled about 5 mL prompting treatment with placement of 2 hemostatic       clips. By the time they were applied, the bleeding was almost completely       stop spontaneously, but it was confirmed to have complete hemostasis       after application of the 2 clips.      Seven sessile and semi-pedunculated polyps were found in the transverse       colon and hepatic flexure. The polyps were 5 to 10 mm in size. These  polyps were removed with a cold snare. Resection and retrieval were       complete. Estimated blood loss was minimal.      A 40 mm polyp was found in the recto-sigmoid colon. The polyp was       sessile and almost flat. Biopsies were taken with a cold forceps for       histology. Area was tattooed with an injection of 2 mL of Spot (carbon       black) Proximally and distally.      A submucosal medium-sized mass was found in the proximal transverse       colon; its diameter measured thirty mm. Biopsies were taken with a cold       forceps for histology--it was very soft and had the typical consistency       and configuration of a lipoma, although it did not  have a yellow color..      Scattered medium-mouthed diverticula were found in the entire colon.      The retroflexed view of the distal rectum and anal verge was normal and       showed no anal or rectal abnormalities. 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                            Biopsied. Clip was placed for radiographic                            localization. Tattooed proximally and distally.                           - Three 8 to 14 mm polyps in the transverse colon,                            at the hepatic flexure and in the proximal                            ascending colon, removed with a hot snare. Resected                            and retrieved.                           - One 11 mm polyp at the hepatic flexure, removed                            with a cold snare. Resected and retrieved. 2                            hemoclips applied at this location because of                            somewhat prolonged oozing, with complete hemostasis                            achieved.                           -  Seven 5 to 10 mm polyps in the transverse colon                            and at the hepatic flexure, removed with a cold                            snare. Resected and retrieved.                           - One 40 mm polyp at the recto-sigmoid colon.                            Biopsied. Tattooed proximally and distally.                           - Likely benign tumor in the proximal transverse                            colon. Biopsied.                           - Diverticulosis in the entire examined colon. Moderate Sedation:      Moderate (conscious) sedation was administered by the endoscopy nurse       and supervised by the endoscopist. The following parameters were       monitored: oxygen saturation, heart rate, blood pressure, and response       to care. Total physician intraservice time was 75 minutes. Recommendation:            - Await pathology results.                           - Repeat colonoscopy in 1 year for surveillance                            based on pathology results.                           - Surgical consultation tomorrow.                           - Clear liquid diet indefinitely, in anticipation                            of possible colon resection in the next several                            days.                           - Perform a flat plate abdominal x-ray today to                            localize tumor, which is adjacent to the SINGLE  clip (the double clips were placed proximal to                            that, at an oozing polypectomy site).                           - May resume Xarelto (rivaroxaban) at prior dose in                            2 days if felt to be strongly indicated, otherwise,                            would prefer to wait one to 2 weeks--unless surgery                            for colonic lesions can be performed this                            admission, in which case the patient needs to                            remain off the Xarelto until the surgery is                            accomplished. Procedure Code(s):        --- Professional ---                           [id number], Colonoscopy, flexible; with removal of                            tumor(s), polyp(s), or other lesion(s) by snare                            technique                           45381, Colonoscopy, flexible; with directed                            submucosal injection(s), any substance                           [id number], 59, Colonoscopy, flexible; with biopsy,                            single or multiple Diagnosis Code(s):        --- Professional ---                           D50.0, Iron deficiency anemia secondary to blood                            loss (chronic)  D12.3, Benign neoplasm of transverse colon (hepatic                             flexure or splenic flexure)                           D49.0, Neoplasm of unspecified behavior of                            digestive system                           D12.7, Benign neoplasm of rectosigmoid junction CPT copyright 2016 [employer]. All rights reserved. The codes documented in this report are preliminary and upon coder review may  be revised to meet current compliance requirements. Ronald Lobo, MD 11/05/2015 4:56:36 PM This report has been signed electronically. Number of Addenda: 0

## 2015-11-05 NOTE — Op Note (Signed)
[facility] Patient Name: Ernest Lara Procedure Date : 11/05/2015 MRN: [id number] Attending MD: Ronald Lobo , MD Date of Birth: May 04, 1941 CSN: [id number] Age: 75 Admit Type: Inpatient Procedure:                Upper GI endoscopy Indications:              Iron deficiency anemia secondary to chronic blood                            loss, Heme positive stool, history of regular Aleve                            use. Providers:                Ronald Lobo, MD, Cleda Daub, RN, Cletis Athens,                            Technician Referring MD:              Medicines:                Midazolam 5 mg IV Complications:            No immediate complications. Estimated Blood Loss:     Estimated blood loss was minimal. Procedure:                Pre-Anesthesia Assessment:                           - Prior to the procedure, a History and Physical                            was performed, and patient medications and                            allergies were reviewed. The patient's tolerance of                            previous anesthesia was also reviewed. THE PATIENT                            HAS A PRIOR HISTORY OF OSA WITH APNEA DURING A TEE                            4 YEARS AGO, SO SEDATION WAS ADMINISTERED                            CAUTIOUSLY AND SPARINGLY. The risks and benefits of                            the procedure and the sedation options and risks                            were discussed with the patient. All questions were  answered, and informed consent was obtained. Prior                            Anticoagulants: The patient has taken Xarelto                            (rivaroxaban), last dose was 2 days prior to                            procedure. ASA Grade Assessment: III - A patient                            with severe systemic disease. After reviewing the                            risks and benefits, the patient was  deemed in                            satisfactory condition to undergo the procedure.                            The Patient was brought from his hospital room to                            the [facility] cone endoscopy unit.                           After obtaining informed consent, the endoscope was                            passed under direct vision. Throughout the                            procedure, the patient's blood pressure, pulse, and                            oxygen saturations were monitored continuously. The                            [id number]) scope was introduced through the                            mouth, and advanced to the second part of duodenum.                            The upper GI endoscopy was accomplished without                            difficulty. The patient tolerated the procedure                            well, specifically without any airway obstruction  or hypoxemia. Scope In: Scope Out: Findings:      The examined esophagus was normal.      The entire examined stomach was normal, other than a small amount of       bile noted at the beginning of the procedure. Specifically, no blood or       coffee-ground material were noted.      Biopsies were taken with a cold forceps for histology to check for       evidence of H. pylori infection.      One non-bleeding cratered duodenal ulcer with no stigmata of bleeding       (questionable minimal pigmented material at base of ulcer, no bleeding,       no adherent clot, no visible vessel) was found in the proximal second       portion of the duodenum, just beyond the duodenal bulb, in the superior       aspect. The lesion was 14 mm in largest dimension.      Several erosions without bleeding were found in the second portion of       the duodenum.      The cardia and gastric fundus were normal on retroflexion. Impression:               - Normal esophagus.                            - Normal stomach. Biopsied.                           - One non-bleeding duodenal ulcer with no stigmata                            of bleeding. This might well account for the                            patient's anemia, especially with him having been                            on anticoagulation.                           - Duodenal erosions without bleeding. Moderate Sedation:      Moderate (conscious) sedation was administered by the endoscopy nurse       and supervised by the endoscopist. The following parameters were       monitored: oxygen saturation, heart rate, blood pressure, and response       to care. Total physician intraservice time was 17 minutes. Recommendation:           - Use Protonix (pantoprazole) 40 mg PO BID for 2                            months, thereafter at least once daily for                            prophylaxis IF the patient is going to be on any  ulcerogenic medications such as aspirin or                            nonsteroidal anti-inflammatory drugs.                           - Use sucralfate tablets 1 gram PO QID for 2 weeksn                            to help expedite healing of the ulcer.                           - Ideally, no (or minimal) ibuprofen, naproxen, or                            other non-steroidal anti-inflammatory drugs going                            forward, in this patient who has had a documented                            ulcer. However, if the patient must use such                            medications, I would favor ongoing PPI prophylaxis,                            as mentioned above.                           - May resume Xarelto (rivaroxaban) at prior dose in                            2 days if strongly indicated, otherwise would                            ideally wait 1 week before resumption.                           - Await pathology results. Treat Helicobacter                             pylori infection if present.                           - Clear liquid diet indefinitely. Procedure Code(s):        --- Professional ---                           [phone], Esophagogastroduodenoscopy, flexible,                            transoral; with biopsy, single or multiple  [id number], Moderate sedation services provided by the                            same physician or other qualified health care                            professional performing the diagnostic or                            therapeutic service that the sedation supports,                            requiring the presence of an independent trained                            observer to assist in the monitoring of the                            patient's level of consciousness and physiological                            status; initial 15 minutes of intraservice time,                            patient age 31 years or older Diagnosis Code(s):        --- Professional ---                           D50.0, Iron deficiency anemia secondary to blood                            loss (chronic)                           K26.9, Duodenal ulcer, unspecified as acute or                            chronic, without hemorrhage or perforation                           R19.5, Other fecal abnormalities CPT copyright 2016 [employer]. All rights reserved. The codes documented in this report are preliminary and upon coder review may  be revised to meet current compliance requirements. Ronald Lobo, MD 11/05/2015 4:25:02 PM This report has been signed electronically. Number of Addenda: 0

## 2015-11-05 NOTE — Progress Notes (Addendum)
[facility] PROGRESS NOTE  Ernest Lara [id number] DOB: 1941/04/13 DOA: 11/03/2015   PCP: [name], MD     Assessment/Plan: Principal Problem:   GI bleed Active Problems:   DM (diabetes mellitus), type 2    HTN (hypertension)   PAF (paroxysmal atrial fibrillation), recent DCCV 10/11/11 to SR.   Hyperlipidemia   Complete heart block (HCC)   Acute blood loss anemia   Acute kidney injury superimposed on chronic kidney disease ([place])   75 y.o. male with medical history significant of HTN, HLD, PAF on xarelto, Systolic CHF EF [id number] in 04/2015, CKD stage III, diabetes mellitus type 2, s/p PM; who presents with approximately 3 weeks of progressively worsening weakness. Upon admission patient was seen to be afebrile with blood pressure initially noted to be as low as 99/53. All other vital signs within normal limits. Lab work revealed a hemoglobin of 4.6. Admitted for workup of his anemia and weakness  Assessment and plan  Acute GI bleed:    guaiac positive on rectal exam, but no note of gross blood on rectal exam. ED physician consulted gastroenterology, [name]. Suspect patient could be having acute upper GI bleed given history of Aleve use., Patient on Xarelto at home which may not be continued due to his renal function, he may need an alternative anticoagulant prior to discharge , INR supratherapeutic, transfuse 2 units of fresh frozen plasma -Continue telemetry - NPO for possible EGD colonoscopy today Status post transfusion of 4 units of packed red blood cells, repeat CBC Continue Protonix drip,From a cardiac perspective he should be able to proceed without further cardiac specific testing per cardiology     Acute blood loss anemia: Patient Presents with weakness. Gives history of every other day Aleve/on Xarelto  .baseline hemoglobin around 10.5-11.  Reticulocyte count does not appear to be elevated at 57.6.  Patient noted to have a total of 4 units of  packed red blood cells and 2 units of FFP - Transfuse as needed to keep a goal hemoglobin >8, hg now 7.4, will transfuse one more unit     Iron deficiency Start Ferrous sulfate supplementation when able   Paroxysmal Atrial fibrillation/ flutter on chronic anticoagulation: Patient currently in sinus rhythm heart rates controlled less than 100.  - Held Xarelto , this may need to be discontinued given GFR of 23 - Continue Coreg    Acute kidney injury on chronic kidney disease stage III: Baseline creatinine noted to be somewhere around 1.4-1.9 upon review of previous admissions. However, creatinine acutely elevated to 2.92 with a BUN of 44 on admission. 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  Hyperlipidemia - Continue Crestor   BPH - Continue doxazosin  Insomnia - Benadryl prn   DVT prophylaxsis SCDs  Code Status:  Full code  Family Communication: Discussed in detail with the patient, all imaging results, lab results explained to the patient   Disposition Plan:  Anticipate discharge in 1-2 days    Consultants:  Gastroenterology  Cardiology  Procedures:  None  Antibiotics: Anti-infectives    None         HPI/Subjective: Denies any melena overnight   Objective: Filed Vitals:    11/04/15 2329 11/05/15 0149 11/05/15 0356 11/05/15 1000  BP: 124/56 119/53 120/52 130/52  Pulse: 75 64 61 65  Temp: 98.3 F (36.8 C) 98.3 F (36.8 C) 97.8 F (36.6 C)   TempSrc: Oral Oral Oral   Resp: 18 18 17    Height:      Weight:   117.436 kg (258 lb 14.4 oz)   SpO2: 100% 97% 99% 100%    Intake/Output Summary (Last 24 hours) at 11/05/15 1152 Last data filed at 11/05/15 0942  Gross per 24 hour  Intake   2606 ml  Output    350 ml  Net   2256 ml     General exam: Appears calm and comfortable  Respiratory system: Clear to auscultation. Respiratory effort normal. Cardiovascular system: S1 & S2 heard, RRR. No JVD, murmurs, rubs, gallops or clicks. No pedal edema. Gastrointestinal system: Abdomen is nondistended, soft and nontender. No organomegaly or masses felt. Normal bowel sounds heard. Central nervous system: Alert and oriented. No focal neurological deficits. Extremities: Symmetric 5 x 5 power. Skin: No rashes, lesions or ulcers Psychiatry: Judgement and insight appear normal. Mood & affect appropriate.     Data Reviewed: I have personally reviewed following labs and imaging studies  Micro Results No results found for this or any previous visit (from the past 240 hour(s)).  Radiology Reports Dg Chest 2 View  11/03/2015  CLINICAL DATA:  Shortness of breath. EXAM: CHEST  2 VIEW COMPARISON:  May 03, 2015. FINDINGS: The heart size and mediastinal contours are within normal limits. Both lungs are clear. No pneumothorax or pleural effusion is noted. Left-sided pacemaker is unchanged in position. The visualized skeletal structures are unremarkable. IMPRESSION: No active cardiopulmonary disease. Electronically Signed   By: Marijo Conception, M.D.   On: 11/03/2015 19:30     CBC  Recent Labs Lab 11/03/15 1913 11/03/15 1956 11/04/15 0950 11/05/15 0452  WBC 6.2 6.2 6.1 6.3  HGB 4.6* 4.6* 7.0* 7.4*  HCT 16.2* 15.6* 23.3* 24.2*  PLT 309 331 260 251  MCV 61.6* 61.2*  67.3* 69.3*  MCH 17.5* 18.0* 20.2* 21.2*  MCHC 28.4* 29.5* 30.0 30.6  RDW 17.1* 17.1* 20.5* 21.8*    Chemistries   Recent Labs Lab 11/03/15 1913 11/05/15 0452  NA 135 139  K 4.1 3.6  CL 99* 105  CO2 23 25  GLUCOSE 279* 137*  BUN 44* 37*  CREATININE 2.92* 2.60*  CALCIUM 9.9 9.8  AST  --  18  ALT  --  14*  ALKPHOS  --  60  BILITOT  --  1.5*   ------------------------------------------------------------------------------------------------------------------ estimated creatinine clearance is 34.4 mL/min (by C-G formula based on Cr of 2.6). ------------------------------------------------------------------------------------------------------------------ No results for input(s): HGBA1C in the last 72 hours. ------------------------------------------------------------------------------------------------------------------ No results for input(s): CHOL, HDL, LDLCALC, TRIG, CHOLHDL, LDLDIRECT in the last 72 hours. ------------------------------------------------------------------------------------------------------------------ No results for input(s): TSH, T4TOTAL, T3FREE, THYROIDAB in the last 72 hours.  Invalid input(s): FREET3 ------------------------------------------------------------------------------------------------------------------  Recent Labs  11/03/15 1956  VITAMINB12 315  FOLATE 18.0  FERRITIN 8*  TIBC 466*  IRON 16*  RETICCTPCT 2.2  Coagulation profile  Recent Labs Lab 11/03/15 2209 11/04/15 1239 11/05/15 0452  INR 3.85* 2.07* 1.54*    No results for input(s): DDIMER in the last 72 hours.  Cardiac Enzymes  Recent Labs Lab 11/03/15 1913  TROPONINI <0.03   ------------------------------------------------------------------------------------------------------------------ Invalid input(s): POCBNP   CBG:  Recent Labs Lab 11/04/15 1953 11/04/15 2134 11/04/15 2354 11/05/15 0354 11/05/15 0759  GLUCAP 179* 131* 120* 136* 148*        Studies: Dg Chest 2 View  11/03/2015  CLINICAL DATA:  Shortness of breath. EXAM: CHEST  2 VIEW COMPARISON:  May 03, 2015. FINDINGS: The heart size and mediastinal contours are within normal limits. Both lungs are clear. No pneumothorax or pleural effusion is noted. Left-sided pacemaker is unchanged in position. The visualized skeletal structures are unremarkable. IMPRESSION: No active cardiopulmonary disease. Electronically Signed   By: Marijo Conception, M.D.   On: 11/03/2015 19:30      Lab Results  Component Value Date   HGBA1C 7.8* 05/01/2015   HGBA1C 7.5* 10/08/2011   Lab Results  Component Value Date   LDLCALC 15 10/09/2011   CREATININE 2.60* 11/05/2015       Scheduled Meds: . carvedilol  12.5 mg Oral q morning - 10a  . doxazosin  8 mg Oral QHS  . insulin aspart  0-9 Units Subcutaneous Q4H  . [START ON 11/07/2015] pantoprazole (PROTONIX) IV  40 mg Intravenous Q12H  . rosuvastatin  20 mg Oral q1800   Continuous Infusions: . sodium chloride 20 mL/hr at 11/05/15 1034  . pantoprozole (PROTONIX) infusion 8 mg/hr (11/05/15 0201)     LOS: 2 days    Time spent: >30 MINS    [facility]  [facility] Pager [phone]. If 7PM-7AM, please contact night-coverage at [email or web address], password [facility] 11/05/2015, 11:52 AM  LOS: 2 days

## 2015-11-06 DIAGNOSIS — N189 Chronic kidney disease, unspecified: Secondary | ICD-10-CM

## 2015-11-06 DIAGNOSIS — K264 Chronic or unspecified duodenal ulcer with hemorrhage: Secondary | ICD-10-CM

## 2015-11-06 DIAGNOSIS — N179 Acute kidney failure, unspecified: Secondary | ICD-10-CM

## 2015-11-06 LAB — COMPREHENSIVE METABOLIC PANEL
ALK PHOS: 55 U/L (ref 38–126)
ALT: 13 U/L — ABNORMAL LOW (ref 17–63)
ANION GAP: 4 — AB (ref 5–15)
AST: 17 U/L (ref 15–41)
Albumin: 2.8 g/dL — ABNORMAL LOW (ref 3.5–5.0)
BILIRUBIN TOTAL: 1.4 mg/dL — AB (ref 0.3–1.2)
BUN: 23 mg/dL — ABNORMAL HIGH (ref 6–20)
CALCIUM: 9.6 mg/dL (ref 8.9–10.3)
CO2: 26 mmol/L (ref 22–32)
Chloride: 107 mmol/L (ref 101–111)
Creatinine, Ser: 2.04 mg/dL — ABNORMAL HIGH (ref 0.61–1.24)
GFR calc Af Amer: 35 mL/min — ABNORMAL LOW (ref >60)
GFR, EST NON AFRICAN AMERICAN: 30 mL/min — AB (ref >60)
Glucose, Bld: 124 mg/dL — ABNORMAL HIGH (ref 65–99)
POTASSIUM: 4 mmol/L (ref 3.5–5.1)
Sodium: 137 mmol/L (ref 135–145)
TOTAL PROTEIN: 5.9 g/dL — AB (ref 6.5–8.1)

## 2015-11-06 LAB — PREPARE RBC (CROSSMATCH)

## 2015-11-06 LAB — GLUCOSE, CAPILLARY
GLUCOSE-CAPILLARY: 119 mg/dL — AB (ref 65–99)
Glucose-Capillary: 144 mg/dL — ABNORMAL HIGH (ref 65–99)
Glucose-Capillary: 174 mg/dL — ABNORMAL HIGH (ref 65–99)
Glucose-Capillary: 163 mg/dL — ABNORMAL HIGH (ref 65–99)

## 2015-11-06 LAB — CBC
HEMATOCRIT: 25.5 % — AB (ref 39.0–52.0)
HEMATOCRIT: 29.8 % — AB (ref 39.0–52.0)
Hemoglobin: 7.9 g/dL — ABNORMAL LOW (ref 13.0–17.0)
Hemoglobin: 9.1 g/dL — ABNORMAL LOW (ref 13.0–17.0)
MCH: 21.9 pg — AB (ref 26.0–34.0)
MCH: 22.1 pg — AB (ref 26.0–34.0)
MCHC: 31 g/dL (ref 30.0–36.0)
MCHC: 30.5 g/dL (ref 30.0–36.0)
MCV: 70.6 fL — AB (ref 78.0–100.0)
MCV: 72.5 fL — ABNORMAL LOW (ref 78.0–100.0)
Platelets: 240 10*3/uL (ref 150–400)
Platelets: 239 10*3/uL (ref 150–400)
RBC: 3.61 MIL/uL — ABNORMAL LOW (ref 4.22–5.81)
RBC: 4.11 MIL/uL — ABNORMAL LOW (ref 4.22–5.81)
RDW: 22 % — AB (ref 11.5–15.5)
RDW: 22.4 % — AB (ref 11.5–15.5)
WBC: 7.1 10*3/uL (ref 4.0–10.5)
WBC: 7.4 10*3/uL (ref 4.0–10.5)

## 2015-11-06 MED ORDER — SODIUM CHLORIDE 0.9 % IV SOLN
Freq: Once | INTRAVENOUS | Status: AC
  Administered 2015-11-06: 09:00:00 via INTRAVENOUS

## 2015-11-06 NOTE — Progress Notes (Signed)
[facility] PROGRESS NOTE  Ernest Lara [id number] DOB: 1941/03/29 DOA: 11/03/2015   PCP: [name], MD     Assessment/Plan: Principal Problem:   GI bleed Active Problems:   DM (diabetes mellitus), type 2    HTN (hypertension)   PAF (paroxysmal atrial fibrillation), recent DCCV 10/11/11 to SR.   Hyperlipidemia   Complete heart block (HCC)   Acute blood loss anemia   Acute kidney injury superimposed on chronic kidney disease ([place])   75 y.o. male with medical history significant of HTN, HLD, PAF on xarelto, Systolic CHF EF [id number] in 04/2015, CKD stage III, diabetes mellitus type 2, s/p PM; who presents with approximately 3 weeks of progressively worsening weakness. Upon admission patient was seen to be afebrile with blood pressure initially noted to be as low as 99/53. All other vital signs within normal limits. Lab work revealed a hemoglobin of 4.6. Admitted for workup of his anemia and weakness. 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  Continue to hold Xarelto, this may not be continued due to his renal function, he may need an alternative anticoagulant prior to discharge , INR supratherapeutic,  GI recommends general surgery consultation for further evaluation of possible malignancy in the transverse colon Status post transfusion of 5 units of packed red blood cells, repeat CBC still shows hemoglobin of 7.9 Continue Protonix drip,From a cardiac perspective he should be able to proceed with surgery without further cardiac specific testing per cardiology     Acute blood loss anemia: Patient Presents with weakness. Gives history of every other day Aleve/on Xarelto  .baseline hemoglobin around 10.5-11.   Status post 5 units of packed  red blood cells and 2 units of FFP Transfusing one more unit today  Iron deficiency Start Ferrous sulfate supplementation when able   Paroxysmal Atrial fibrillation/ flutter on chronic anticoagulation: Patient currently in sinus rhythm heart rates controlled less than 100.  - Held Xarelto , this may need to be discontinued given GFR of 23 - Continue Coreg    Acute kidney injury on chronic kidney disease stage III: Baseline creatinine noted to be somewhere around 1.4-1.9 upon review of previous admissions. However, creatinine acutely elevated to 2.92 with a BUN of 44 on admission. 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  Hyperlipidemia - Continue Crestor   BPH - Continue doxazosin  Insomnia - Benadryl prn   DVT prophylaxsis SCDs  Code Status:  Full code     Family Communication: Discussed in detail with the patient, all imaging results, lab results explained to the patient   Disposition Plan:  Anticipate discharge in 1-2  days    Consultants:  Gastroenterology  Cardiology  Procedures:  None  Antibiotics: Anti-infectives    None         HPI/Subjective: Continues to be on a clear liquid diet, no active bleeding overnight   Objective: Filed Vitals:   11/05/15 1700 11/05/15 2031  11/06/15 0648 11/06/15 1050  BP: 153/73 162/64 144/87 128/63  Pulse: 59 59 68 68  Temp:  97.5 F (36.4 C) 98.5 F (36.9 C) 98.4 F (36.9 C)  TempSrc:  Oral Oral Oral  Resp: 18 16 17 18   Height:      Weight:   116.983 kg (257 lb 14.4 oz)   SpO2: 100% 97% 96%     Intake/Output Summary (Last 24 hours) at 11/06/15 1109 Last data filed at 11/06/15 0600  Gross per 24 hour  Intake 828.33 ml  Output      0 ml  Net 828.33 ml     General exam: Appears calm and comfortable  Respiratory system: Clear to auscultation. Respiratory effort normal. Cardiovascular system: S1 & S2 heard, RRR. No JVD, murmurs, rubs, gallops or clicks. No pedal edema. Gastrointestinal system: Abdomen is nondistended, soft and nontender. No organomegaly or masses felt. Normal bowel sounds heard. Central nervous system: Alert and oriented. No focal neurological deficits. Extremities: Symmetric 5 x 5 power. Skin: No rashes, lesions or ulcers Psychiatry: Judgement and insight appear normal. Mood & affect appropriate.     Data Reviewed: I have personally reviewed following labs and imaging studies  Micro Results No results found for this or any previous visit (from the past 240 hour(s)).  Radiology Reports Dg Chest 2 View  11/03/2015  CLINICAL DATA:  Shortness of breath. EXAM: CHEST  2 VIEW COMPARISON:  May 03, 2015. FINDINGS: The heart size and mediastinal contours are within normal limits. Both lungs are clear. No pneumothorax or pleural effusion is noted. Left-sided pacemaker is unchanged in position. The visualized skeletal structures are unremarkable. IMPRESSION: No active cardiopulmonary disease. Electronically Signed   By: Marijo Conception, M.D.   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 EXAM: PORTABLE ABDOMEN - 1 VIEW COMPARISON:  No priors. FINDINGS: Several nondilated gas-filled loops of small bowel are noted throughout  the central abdomen. No pathologic dilatation of small bowel or colon. No gross evidence of pneumoperitoneum on these supine images. Clips are seen projecting over the left side of the epigastric region and overlying the right upper quadrant. IMPRESSION: 1. Nonspecific, nonobstructive bowel gas pattern. 2. No pneumoperitoneum. Electronically Signed   By: Vinnie Langton M.D.   On: 11/05/2015 17:34     CBC  Recent Labs Lab 11/03/15 1913 11/03/15 1956 11/04/15 0950 11/05/15 0452 11/06/15 0347  WBC 6.2 6.2 6.1 6.3 7.1  HGB 4.6* 4.6* 7.0* 7.4* 7.9*  HCT 16.2* 15.6* 23.3* 24.2* 25.5*  PLT 309 331 260 251 240  MCV 61.6* 61.2* 67.3* 69.3* 70.6*  MCH 17.5* 18.0* 20.2* 21.2* 21.9*  MCHC 28.4* 29.5* 30.0 30.6 31.0  RDW 17.1* 17.1* 20.5* 21.8* 22.0*    Chemistries   Recent Labs Lab 11/03/15 1913 11/05/15 0452 11/06/15 0347  NA 135 139 137  K 4.1 3.6 4.0  CL 99* 105 107  CO2 23 25 26   GLUCOSE 279* 137* 124*  BUN 44* 37* 23*  CREATININE 2.92* 2.60* 2.04*  CALCIUM 9.9 9.8 9.6  AST  --  18 17  ALT  --  14* 13*  ALKPHOS  --  60 55  BILITOT  --  1.5* 1.4*   ------------------------------------------------------------------------------------------------------------------ estimated creatinine clearance is 43.8 mL/min (by C-G formula based on Cr of 2.04). ------------------------------------------------------------------------------------------------------------------ No results for input(s): HGBA1C in the last 72 hours. ------------------------------------------------------------------------------------------------------------------ No results for input(s): CHOL, HDL, LDLCALC, TRIG, CHOLHDL, LDLDIRECT in the last 72 hours. ------------------------------------------------------------------------------------------------------------------ No results for input(s): TSH, T4TOTAL, T3FREE, THYROIDAB in the last 72 hours.  Invalid input(s):  FREET3 ------------------------------------------------------------------------------------------------------------------  Recent Labs  11/03/15 1956  VITAMINB12 315  FOLATE 18.0  FERRITIN 8*  TIBC 466*  IRON 16*  RETICCTPCT 2.2    Coagulation profile  Recent Labs Lab 11/03/15 2209 11/04/15 1239 11/05/15 0452  INR 3.85* 2.07* 1.54*    No results for input(s): DDIMER in the last 72 hours.  Cardiac Enzymes  Recent Labs Lab 11/03/15 1913  TROPONINI <0.03   ------------------------------------------------------------------------------------------------------------------ Invalid input(s): POCBNP   CBG:  Recent Labs Lab 11/05/15 1633 11/05/15 2031 11/05/15 2332 11/06/15 0319 11/06/15 0614  GLUCAP 148* 203* 153* 119* 144*       Studies: Dg Abd Portable 1v  11/05/2015  CLINICAL DATA:  75 year old male with history of colonic neoplasm. EXAM: PORTABLE ABDOMEN - 1 VIEW COMPARISON:  No priors. FINDINGS: Several nondilated gas-filled loops of small bowel are noted throughout the central abdomen. No pathologic dilatation of small bowel or colon. No gross evidence of pneumoperitoneum on these supine images. Clips are seen projecting over the left side of the epigastric region and overlying the right upper quadrant. IMPRESSION: 1. Nonspecific, nonobstructive bowel gas pattern. 2. No pneumoperitoneum. Electronically Signed   By: Vinnie Langton M.D.   On: 11/05/2015 17:34      Lab Results  Component Value Date   HGBA1C 7.8* 05/01/2015   HGBA1C 7.5* 10/08/2011   Lab Results  Component Value Date   LDLCALC 15 10/09/2011   CREATININE 2.04* 11/06/2015       Scheduled Meds: . sodium chloride   Intravenous Once  . carvedilol  12.5 mg Oral q morning - 10a  . doxazosin  8 mg Oral QHS  . insulin aspart  0-9 Units Subcutaneous Q4H  . pantoprazole  40 mg Oral BID AC  . polyethylene glycol  17 g Oral BID  . rosuvastatin  20 mg Oral q1800  . sucralfate  1 g Oral QID    Continuous Infusions:     LOS: 3 days    Time spent: >30 MINS    [facility]  [facility] Pager [phone]. If 7PM-7AM, please contact night-coverage at [email or web address], password [facility] 11/06/2015, 11:09 AM  LOS: 3 days

## 2015-11-06 NOTE — Progress Notes (Signed)
Patient ID: Ernest Lara, male   DOB: 1941-03-13, 75 y.o.   MRN: [id number]    Subjective:  Denies SSCP, palpitations or Dyspnea Unaware of possible need for surgery   Objective:  Filed Vitals:   11/05/15 1600 11/05/15 1700 11/05/15 2031 11/06/15 0648  BP: 155/92 153/73 162/64 144/87  Pulse: 64 59 59 68  Temp:   97.5 F (36.4 C) 98.5 F (36.9 C)  TempSrc:   Oral Oral  Resp: 18 18 16 17   Height:      Weight:    116.983 kg (257 lb 14.4 oz)  SpO2: 98% 100% 97% 96%    Intake/Output from previous day:  Intake/Output Summary (Last 24 hours) at 11/06/15 0751 Last data filed at 11/06/15 0600  Gross per 24 hour  Intake 828.33 ml  Output      0 ml  Net 828.33 ml    Physical Exam: Affect appropriate Healthy:  appears stated age HEENT: normal Neck supple with no adenopathy JVP normal no bruits no thyromegaly Lungs clear with no wheezing and good diaphragmatic motion Heart:  S1/S2 no murmur, no rub, gallop or click  CRT under left clavicle  PMI normal Abdomen: benighn, BS positve, no tenderness, no AAA no bruit.  No HSM or HJR Distal pulses intact with no bruits No edema Neuro non-focal Skin warm and dry No muscular weakness   Lab Results: Basic Metabolic Panel:  Recent Labs  11/05/15 0452 11/06/15 0347  NA 139 137  K 3.6 4.0  CL 105 107  CO2 25 26  GLUCOSE 137* 124*  BUN 37* 23*  CREATININE 2.60* 2.04*  CALCIUM 9.8 9.6   Liver Function Tests:  Recent Labs  11/05/15 0452 11/06/15 0347  AST 18 17  ALT 14* 13*  ALKPHOS 60 55  BILITOT 1.5* 1.4*  PROT 6.3* 5.9*  ALBUMIN 3.1* 2.8*   CBC:  Recent Labs  11/05/15 0452 11/06/15 0347  WBC 6.3 7.1  HGB 7.4* 7.9*  HCT 24.2* 25.5*  MCV 69.3* 70.6*  PLT 251 240   Cardiac Enzymes:  Recent Labs  11/03/15 1913  TROPONINI <0.03    Anemia Panel:  Recent Labs  11/03/15 1956  VITAMINB12 315  FOLATE 18.0  FERRITIN 8*  TIBC 466*  IRON 16*  RETICCTPCT 2.2    Imaging: Dg Abd Portable  1v  11/05/2015  CLINICAL DATA:  75 year old male with history of colonic neoplasm. EXAM: PORTABLE ABDOMEN - 1 VIEW COMPARISON:  No priors. FINDINGS: Several nondilated gas-filled loops of small bowel are noted throughout the central abdomen. No pathologic dilatation of small bowel or colon. No gross evidence of pneumoperitoneum on these supine images. Clips are seen projecting over the left side of the epigastric region and overlying the right upper quadrant. IMPRESSION: 1. Nonspecific, nonobstructive bowel gas pattern. 2. No pneumoperitoneum. Electronically Signed   By: Vinnie Langton M.D.   On: 11/05/2015 17:34    Cardiac Studies:  ECG:  SR LAD IVCD    Telemetry:  NSR no afib 11/06/2015   Echo: 05/02/15 Study Conclusions  - Left ventricle: The cavity size was normal. Wall thickness was  normal. Systolic function was normal. The estimated ejection  fraction was in the range of 60% to 65%. Wall motion was normal;  there were no regional wall motion abnormalities. - Aortic valve: There was mild regurgitation. - Mitral valve: There was mild regurgitation. - Left atrium: The atrium was moderately dilated. - Right atrium: The atrium was moderately dilated. - Pulmonary arteries: Systolic  pressure was mildly increased. PA  peak pressure: 34 mm Hg (S).  Medications:   . carvedilol  12.5 mg Oral q morning - 10a  . doxazosin  8 mg Oral QHS  . insulin aspart  0-9 Units Subcutaneous Q4H  . pantoprazole  40 mg Oral BID AC  . polyethylene glycol  17 g Oral BID  . rosuvastatin  20 mg Oral q1800  . sucralfate  1 g Oral QID       Assessment/Plan:  PAF:  xarelto stopped due to anemia and presumed GI bleed from ulcer and polyps continue coreg Chol:  On statin Anemia:  See note from [name] recommends surgery this admission for presumed malignant polyps Clear From cardiac perspective to proceed continue to hold xarelto CRF:  ACE held hydrate as needed CRT:  Normal device function   DCM:  Non ischemic EF returned to normal by echo 04/2015  Patient needs to have diagnosis , plan and surgical consult as he is still unaware for the most part of Dr Buccini's recommendations Other than my initial discussion this am  Ernest Lara 11/06/2015, 7:51 AM

## 2015-11-06 NOTE — Care Management Important Message (Signed)
Important Message  Patient Details  Name: Ernest Lara MRN: [id number] Date of Birth: 10-23-1940   Medicare Important Message Given:  Yes    [name] [name] 11/06/2015, 9:49 AM

## 2015-11-06 NOTE — Progress Notes (Signed)
GASTROENTEROLOGY PROGRESS NOTE  Problem:   Profound anemia and heme positivity on Xarelto as an outpatient. Duodenal ulcer. Colonic mass, multiple colonic polyps, possible rectal mass.  Subjective: Doing okay, but anxious.  Objective: Patient reports several small loose bowel movements while on MiraLAX to maintain bowel prep.  KUB from yesterday showed the double clips at the polypectomy site which is right near the hepatic flexure; the single clip marking the mass apparently dislodged and was no longer radiographically apparent, but we know that the mass was slightly distal to the double clips, which would put it in the proximal transverse colon.  Hemoglobin 9.1 currently, following transfusion. Patient remains off Xarelto for the time being.   Assessment: Clinically stable at the moment.  Plan: 1. Surgical consult has been placed and is pending at this time. Issues for this patient include (1) whether he needs some surgical management for his rectal lesion in addition to his transverse colonic mass; (2) ideally getting his surgery within the next couple of days so that he can maintain his bowel prep and get back on his anticoagulation; and (3) anxiety issues pertaining to his wife dying of a neuroendocrine tumor after the surgeon told the family that "we got it all."  2. Await pathology results, with particular emphasis on whether the rectal lesion is neoplastic.  Cleotis Nipper, M.D. 11/06/2015 8:07 PM  Pager [phone] If no answer or after 5 PM call [phone]

## 2015-11-07 ENCOUNTER — Encounter (HOSPITAL_COMMUNITY): Payer: Self-pay | Admitting: General Surgery

## 2015-11-07 DIAGNOSIS — E118 Type 2 diabetes mellitus with unspecified complications: Secondary | ICD-10-CM

## 2015-11-07 DIAGNOSIS — D509 Iron deficiency anemia, unspecified: Secondary | ICD-10-CM

## 2015-11-07 DIAGNOSIS — I10 Essential (primary) hypertension: Secondary | ICD-10-CM

## 2015-11-07 DIAGNOSIS — D374 Neoplasm of uncertain behavior of colon: Secondary | ICD-10-CM

## 2015-11-07 DIAGNOSIS — D490 Neoplasm of unspecified behavior of digestive system: Secondary | ICD-10-CM | POA: Diagnosis present

## 2015-11-07 DIAGNOSIS — D500 Iron deficiency anemia secondary to blood loss (chronic): Secondary | ICD-10-CM | POA: Diagnosis present

## 2015-11-07 LAB — GLUCOSE, CAPILLARY
GLUCOSE-CAPILLARY: 140 mg/dL — AB (ref 65–99)
GLUCOSE-CAPILLARY: 145 mg/dL — AB (ref 65–99)
GLUCOSE-CAPILLARY: 164 mg/dL — AB (ref 65–99)
Glucose-Capillary: 138 mg/dL — ABNORMAL HIGH (ref 65–99)
Glucose-Capillary: 138 mg/dL — ABNORMAL HIGH (ref 65–99)
Glucose-Capillary: 160 mg/dL — ABNORMAL HIGH (ref 65–99)
Glucose-Capillary: 208 mg/dL — ABNORMAL HIGH (ref 65–99)

## 2015-11-07 LAB — COMPREHENSIVE METABOLIC PANEL
ALK PHOS: 57 U/L (ref 38–126)
ALT: 13 U/L — ABNORMAL LOW (ref 17–63)
ANION GAP: 6 (ref 5–15)
AST: 17 U/L (ref 15–41)
Albumin: 2.8 g/dL — ABNORMAL LOW (ref 3.5–5.0)
BILIRUBIN TOTAL: 1.6 mg/dL — AB (ref 0.3–1.2)
BUN: 14 mg/dL (ref 6–20)
CALCIUM: 9.7 mg/dL (ref 8.9–10.3)
CO2: 23 mmol/L (ref 22–32)
Chloride: 107 mmol/L (ref 101–111)
Creatinine, Ser: 1.76 mg/dL — ABNORMAL HIGH (ref 0.61–1.24)
GFR calc Af Amer: 42 mL/min — ABNORMAL LOW (ref >60)
GFR, EST NON AFRICAN AMERICAN: 36 mL/min — AB (ref >60)
Glucose, Bld: 142 mg/dL — ABNORMAL HIGH (ref 65–99)
POTASSIUM: 4.2 mmol/L (ref 3.5–5.1)
Sodium: 136 mmol/L (ref 135–145)
TOTAL PROTEIN: 6.1 g/dL — AB (ref 6.5–8.1)

## 2015-11-07 LAB — TYPE AND SCREEN
ABO/RH(D): O POS
ANTIBODY SCREEN: NEGATIVE
UNIT DIVISION: 0
UNIT DIVISION: 0
UNIT DIVISION: 0
UNIT DIVISION: 0
Unit division: 0
Unit division: 0

## 2015-11-07 LAB — CBC
HEMATOCRIT: 28.3 % — AB (ref 39.0–52.0)
Hemoglobin: 8.7 g/dL — ABNORMAL LOW (ref 13.0–17.0)
MCH: 22.4 pg — ABNORMAL LOW (ref 26.0–34.0)
MCHC: 30.7 g/dL (ref 30.0–36.0)
MCV: 72.9 fL — AB (ref 78.0–100.0)
Platelets: 206 10*3/uL (ref 150–400)
RBC: 3.88 MIL/uL — ABNORMAL LOW (ref 4.22–5.81)
RDW: 22.9 % — AB (ref 11.5–15.5)
WBC: 7.6 10*3/uL (ref 4.0–10.5)

## 2015-11-07 LAB — CEA: CEA: 1.2 ng/mL (ref 0.0–4.7)

## 2015-11-07 MED ORDER — DIATRIZOATE MEGLUMINE & SODIUM 66-10 % PO SOLN
ORAL | Status: AC
  Administered 2015-11-07: 21:00:00
  Filled 2015-11-07: qty 30

## 2015-11-07 MED ORDER — IOPAMIDOL (ISOVUE-300) INJECTION 61%
INTRAVENOUS | Status: AC
  Administered 2015-11-08: 100 mL
  Filled 2015-11-07: qty 100

## 2015-11-07 MED ORDER — SODIUM CHLORIDE 0.9 % IV SOLN
INTRAVENOUS | Status: DC
  Administered 2015-11-07 – 2015-11-10 (×3): via INTRAVENOUS

## 2015-11-07 MED ORDER — BOOST / RESOURCE BREEZE PO LIQD
1.0000 | Freq: Three times a day (TID) | ORAL | Status: DC
  Administered 2015-11-07 – 2015-11-09 (×7): 1 via ORAL

## 2015-11-07 NOTE — Progress Notes (Signed)
[facility] PROGRESS NOTE  Ernest Lara [id number] DOB: 10-Oct-1940 DOA: 11/03/2015   PCP: [name], MD     Assessment/Plan: Principal Problem:   GI bleed Active Problems:   DM (diabetes mellitus), type 2    HTN (hypertension)   PAF (paroxysmal atrial fibrillation), recent DCCV 10/11/11 to SR.   Hyperlipidemia   Complete heart block (HCC)   Acute blood loss anemia   Acute kidney injury superimposed on chronic kidney disease ([place])   75 y.o. male with medical history significant of HTN, HLD, PAF on xarelto, Systolic CHF EF [id number] in 04/2015, CKD stage III, diabetes mellitus type 2, s/p PM; who presents with approximately 3 weeks of progressively worsening weakness. Upon admission patient was seen to be afebrile with blood pressure initially noted to be as low as 99/53. All other vital signs within normal limits. Lab work revealed a hemoglobin of 4.6. Admitted for workup of his anemia and weakness. 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  Continue to hold Xarelto, this may not be continued due to his renal function, he may need an alternative anticoagulant prior to discharge , INR supratherapeutic on admission, reversed with FFP GI recommends general surgery consultation for further evaluation of possible malignancy in the transverse colon Status post transfusion of 6 units of packed red blood cells, repeat CBC 9.1> 8.7 Continue Protonix  ,From a cardiac perspective he should be able to proceed with surgery without further cardiac specific testing per cardiology. Continue Coreg perioperatively CEA normal     Acute blood loss anemia: Patient Presents with weakness. Gives history of every other day Aleve/on Xarelto  .baseline hemoglobin  around 10.5-11.   Status post 6 units of packed red blood cells and 2 units of FFP    Iron deficiency Start Ferrous sulfate supplementation when able   Paroxysmal Atrial fibrillation/ flutter on chronic anticoagulation: Patient currently in sinus rhythm heart rates controlled less than 100. CHADS2VASC=3 - Held Xarelto , this may need to be discontinued given GFR of 23 - Continue Coreg    Acute kidney injury on chronic kidney disease stage III: Baseline creatinine noted to be somewhere around 1.4-1.9 upon review of previous admissions. However, creatinine acutely elevated to 2.92 with a BUN of 44 on admission. 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  Hyperlipidemia - Continue Crestor   BPH - Continue doxazosin  Insomnia - Benadryl prn   DVT prophylaxsis SCDs  Code Status:  Full code     Family Communication: Discussed in detail with the patient/daughter, all imaging results, lab results explained to the patient  Disposition Plan:  Pending general surgery consultation    Consultants:  Gastroenterology  Cardiology  Gen. surgery  Procedures:  None  Antibiotics: Anti-infectives    None         HPI/Subjective: Patient anxious,He is very worried about the  surgery, no chest pain, no palpitations, no SOB   Objective: Filed Vitals:   11/06/15 1126 11/06/15 1312 11/06/15 2024 11/07/15 0444  BP: 129/60 135/68 129/80 138/61  Pulse: 63 67 67 76  Temp: 98 F (36.7 C) 98 F (36.7 C) 98.1 F (36.7 C) 99.1 F (37.3 C)  TempSrc: Oral Oral Oral Oral  Resp: 18 18 19 18   Height:      Weight:    118.298 kg (260 lb 12.8 oz)  SpO2:   99% 96%    Intake/Output Summary (Last 24 hours) at 11/07/15 [id number] Last data filed at 11/07/15 0600  Gross per 24 hour  Intake   2520 ml  Output    625 ml  Net   1895 ml     General exam: Appears calm and comfortable  Respiratory system: Clear to auscultation. Respiratory effort normal. Cardiovascular system: S1 & S2 heard, RRR. No JVD, murmurs, rubs, gallops or clicks. No pedal edema. Gastrointestinal system: Abdomen is nondistended, soft and nontender. No organomegaly or masses felt. Normal bowel sounds heard. Central nervous system: Alert and oriented. No focal neurological deficits. Extremities: Symmetric 5 x 5 power. Skin: No rashes, lesions or ulcers Psychiatry: Judgement and insight appear normal. Mood & affect appropriate.     Data Reviewed: I have personally reviewed following labs and imaging studies  Micro Results No results found for this or any previous visit (from the past 240 hour(s)).  Radiology Reports Dg Chest 2 View  11/03/2015  CLINICAL DATA:  Shortness of breath. EXAM: CHEST  2 VIEW COMPARISON:  May 03, 2015. FINDINGS: The heart size and mediastinal contours are within normal limits. Both lungs are clear. No pneumothorax or pleural effusion is noted. Left-sided pacemaker is unchanged in position. The visualized skeletal structures are unremarkable. IMPRESSION: No active cardiopulmonary disease. Electronically Signed   By: Marijo Conception, M.D.   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 EXAM: PORTABLE  ABDOMEN - 1 VIEW COMPARISON:  No priors. FINDINGS: Several nondilated gas-filled loops of small bowel are noted throughout the central abdomen. No pathologic dilatation of small bowel or colon. No gross evidence of pneumoperitoneum on these supine images. Clips are seen projecting over the left side of the epigastric region and overlying the right upper quadrant. IMPRESSION: 1. Nonspecific, nonobstructive bowel gas pattern. 2. No pneumoperitoneum. Electronically Signed   By: Vinnie Langton M.D.   On: 11/05/2015 17:34     CBC  Recent Labs Lab 11/04/15 0950 11/05/15 0452 11/06/15 0347 11/06/15 1504 11/07/15 0340  WBC 6.1 6.3 7.1 7.4 7.6  HGB 7.0* 7.4* 7.9* 9.1* 8.7*  HCT 23.3* 24.2* 25.5* 29.8* 28.3*  PLT 260 251 240 239 206  MCV 67.3* 69.3* 70.6* 72.5* 72.9*  MCH 20.2* 21.2* 21.9* 22.1* 22.4*  MCHC 30.0 30.6 31.0 30.5 30.7  RDW 20.5* 21.8* 22.0* 22.4* 22.9*    Chemistries   Recent Labs Lab 11/03/15 1913 11/05/15 0452 11/06/15 0347 11/07/15 0340  NA 135 139 137 136  K 4.1 3.6 4.0 4.2  CL 99* 105 107 107  CO2 23 25 26 23   GLUCOSE 279* 137* 124* 142*  BUN 44*  37* 23* 14  CREATININE 2.92* 2.60* 2.04* 1.76*  CALCIUM 9.9 9.8 9.6 9.7  AST  --  18 17 17   ALT  --  14* 13* 13*  ALKPHOS  --  60 55 57  BILITOT  --  1.5* 1.4* 1.6*   ------------------------------------------------------------------------------------------------------------------ estimated creatinine clearance is 51 mL/min (by C-G formula based on Cr of 1.76). ------------------------------------------------------------------------------------------------------------------ No results for input(s): HGBA1C in the last 72 hours. ------------------------------------------------------------------------------------------------------------------ No results for input(s): CHOL, HDL, LDLCALC, TRIG, CHOLHDL, LDLDIRECT in the last 72  hours. ------------------------------------------------------------------------------------------------------------------ No results for input(s): TSH, T4TOTAL, T3FREE, THYROIDAB in the last 72 hours.  Invalid input(s): FREET3 ------------------------------------------------------------------------------------------------------------------ No results for input(s): VITAMINB12, FOLATE, FERRITIN, TIBC, IRON, RETICCTPCT in the last 72 hours.  Coagulation profile  Recent Labs Lab 11/03/15 2209 11/04/15 1239 11/05/15 0452  INR 3.85* 2.07* 1.54*    No results for input(s): DDIMER in the last 72 hours.  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 EXAM: PORTABLE ABDOMEN - 1 VIEW COMPARISON:  No priors. FINDINGS: Several nondilated gas-filled loops of small bowel are noted throughout the central abdomen. No pathologic dilatation of small bowel or colon. No gross evidence of pneumoperitoneum on these supine images. Clips are seen projecting over the left side of the epigastric region and overlying the right upper quadrant. IMPRESSION: 1. Nonspecific, nonobstructive bowel gas pattern. 2. No pneumoperitoneum. Electronically Signed   By: Vinnie Langton M.D.   On: 11/05/2015 17:34      Lab Results  Component Value Date   HGBA1C 7.8* 05/01/2015   HGBA1C 7.5* 10/08/2011   Lab Results  Component Value Date   LDLCALC 15 10/09/2011   CREATININE 1.76* 11/07/2015       Scheduled Meds: . carvedilol  12.5 mg Oral q morning - 10a  . doxazosin  8 mg Oral QHS  . insulin aspart   0-9 Units Subcutaneous Q4H  . pantoprazole  40 mg Oral BID AC  . polyethylene glycol  17 g Oral BID  . rosuvastatin  20 mg Oral q1800  . sucralfate  1 g Oral QID   Continuous Infusions:     LOS: 4 days    Time spent: >30 MINS    [facility]  [facility] Pager [phone]. If 7PM-7AM, please contact night-coverage at [email or web address], password [email or web address] 11/07/2015, 8:32 AM  LOS: 4 days

## 2015-11-07 NOTE — Progress Notes (Signed)
Patient Name: Ernest Lara Date of Encounter: 11/07/2015  Principal Problem:   GI bleed Active Problems:   DM (diabetes mellitus), type 2    HTN (hypertension)   PAF (paroxysmal atrial fibrillation), recent DCCV 10/11/11 to SR.   Hyperlipidemia   Complete heart block (HCC)   Acute blood loss anemia   Acute kidney injury superimposed on chronic kidney disease [facility])   Primary Cardiologist: Dr Sallyanne Kuster Patient Profile: 75 yo male w/ hx PAF on Xarelto (CHADS2VASC=3), HTN, DM, CHB s/p BSci CRT-P, CKD III, remote NICM w/ EF now nl (echo 04/2015). Admitted 05/26 w/ GIB, Hgb 4.6, colon mass  SUBJECTIVE: He is very worried about the surgery, no chest pain, no palpitations, no SOB  OBJECTIVE Filed Vitals:   11/06/15 1126 11/06/15 1312 11/06/15 2024 11/07/15 0444  BP: 129/60 135/68 129/80 138/61  Pulse: 63 67 67 76  Temp: 98 F (36.7 C) 98 F (36.7 C) 98.1 F (36.7 C) 99.1 F (37.3 C)  TempSrc: Oral Oral Oral Oral  Resp: 18 18 19 18   Height:      Weight:    260 lb 12.8 oz (118.298 kg)  SpO2:   99% 96%    Intake/Output Summary (Last 24 hours) at 11/07/15 0809 Last data filed at 11/07/15 0600  Gross per 24 hour  Intake   2520 ml  Output    625 ml  Net   1895 ml   Filed Weights   11/05/15 0356 11/06/15 0648 11/07/15 0444  Weight: 258 lb 14.4 oz (117.436 kg) 257 lb 14.4 oz (116.983 kg) 260 lb 12.8 oz (118.298 kg)    PHYSICAL EXAM General: Well developed, well nourished, male in no acute distress. Head: Normocephalic, atraumatic.  Neck: Supple without bruits, JVD not elevated. Lungs:  Resp regular and unlabored, CTA. Heart: RRR, S1, S2, no S3, S4, soft murmur; no rub. Abdomen: Soft, non-tender, non-distended, BS + x 4.  Extremities: No clubbing, cyanosis, edema.  Neuro: Alert and oriented X 3. Moves all extremities spontaneously. Psych: Normal affect.  LABS: CBC: Recent Labs  11/06/15 1504 11/07/15 0340  WBC 7.4 7.6  HGB 9.1* 8.7*  HCT 29.8* 28.3*  MCV  72.5* 72.9*  PLT 239 206   INR: Recent Labs  11/05/15 0452  INR 0000000*   Basic Metabolic Panel: Recent Labs  11/06/15 0347 11/07/15 0340  NA 137 136  K 4.0 4.2  CL 107 107  CO2 26 23  GLUCOSE 124* 142*  BUN 23* 14  CREATININE 2.04* 1.76*  CALCIUM 9.6 9.7   Liver Function Tests: Recent Labs  11/06/15 0347 11/07/15 0340  AST 17 17  ALT 13* 13*  ALKPHOS 55 57  BILITOT 1.4* 1.6*  PROT 5.9* 6.1*  ALBUMIN 2.8* 2.8*   BNP:  B NATRIURETIC PEPTIDE  Date/Time Value Ref Range Status  11/03/2015 07:13 PM 215.8* 0.0 - 100.0 pg/mL Final   TELE:  SR, +/- V pacing, PVCs, ?afib at times     Radiology/Studies: Dg Abd Portable 1v 11/05/2015  CLINICAL DATA:  75 year old male with history of colonic neoplasm. EXAM: PORTABLE ABDOMEN - 1 VIEW COMPARISON:  No priors. FINDINGS: Several nondilated gas-filled loops of small bowel are noted throughout the central abdomen. No pathologic dilatation of small bowel or colon. No gross evidence of pneumoperitoneum on these supine images. Clips are seen projecting over the left side of the epigastric region and overlying the right upper quadrant. IMPRESSION: 1. Nonspecific, nonobstructive bowel gas pattern. 2. No pneumoperitoneum. Electronically Signed  By: Vinnie Langton M.D.   On: 11/05/2015 17:34   Dg Chest 2 View 11/03/2015  CLINICAL DATA:  Shortness of breath. EXAM: CHEST  2 VIEW COMPARISON:  May 03, 2015. FINDINGS: The heart size and mediastinal contours are within normal limits. Both lungs are clear. No pneumothorax or pleural effusion is noted. Left-sided pacemaker is unchanged in position. The visualized skeletal structures are unremarkable. IMPRESSION: No active cardiopulmonary disease. Electronically Signed   By: Marijo Conception, M.D.   On: 11/03/2015 19:30     Current Medications:  . carvedilol  12.5 mg Oral q morning - 10a  . doxazosin  8 mg Oral QHS  . insulin aspart  0-9 Units Subcutaneous Q4H  . pantoprazole  40 mg Oral BID  AC  . polyethylene glycol  17 g Oral BID  . rosuvastatin  20 mg Oral q1800  . sucralfate  1 g Oral QID      ASSESSMENT AND PLAN: Principal Problem:   GI bleed - per IM/GI - s/p transfusion, H&H trending down - colon polyps and mass seen on colonoscopy, final path report pending - [facility] needs to see today  Active Problems:   DM (diabetes mellitus), type 2  - per IM    HTN (hypertension) - good control on current rx - per IM    PAF (paroxysmal atrial fibrillation), recent DCCV 10/11/11 to SR -  Xarelto stopped due to anemia and presumed GI bleed from ulcer and polyps  - continue coreg    Hyperlipidemia - on statin    Complete heart block (HCC) - [employer] CRT-P device - appears to have Normal device function  - MD advise if check is needed    Acute blood loss anemia - See note from [name], he recommends surgery this admission for presumed malignant polyps colon and possibly rectum, final path report pending - Clear from cardiac perspective to proceed, no further workup needed - continue to hold xarelto    Acute kidney injury superimposed on chronic kidney disease ([place]) - initial BUN/CR 44/2.92 - ACE held  - hydrate as needed per IM  Signed, Barrett, Rhonda , PA-C 8:09 AM 11/07/2015  I have seen and examined the patient along with Barrett, Rhonda , PA-C.  I have reviewed the chart, notes and new data.  I agree with PA/NP's note.  Key new complaints: feeling better, sick of chicken broth Key examination changes: no overt CHF, RRR, no S3, no JVD Key new findings / data: will check pacemaker today, but has not had sustained atrial arrhythmia in years; improving renal function and stable Hgb. Pathology pending.  PLAN: Stay off anticoagulation until bleeding sources have been removed/healed (a few weeks for the duodenal ulcers, hopefully surgery this admission for the colon masses). Check pacemaker today. I do not think he is pacemaker dependent, although he  presented with CHB and ventricular escape rhythm in Nov 2016 (on high dose carvedilol). Low risk for major CV complications with partial/hemicolectomy. Continue beta blocker without interruption periop. Renal function back to baseline, restart ACEi tomorrow.   Sanda Klein, MD, [place] [phone] 11/07/2015, 9:49 AM

## 2015-11-07 NOTE — Consult Note (Signed)
Reason for Consult: transverse colon mass, rectosigmoid polyp, acute blood loss anemia  Referring Physician: Dr. Ronald Lobo    HPI: Ernest Lara is a 75 year old male with a history of PAF on Xarelto, NICMP, complete heart block s/p pacemaker, acute on chronic renal insufficiency, HTN, DM II who was admitted on 5/26 with GI bleeding.  Since admission, he has received 6 units of pRBCs.  He underwent a colonoscopy by Dr. [name] on 5/28 and was found to have a transverse colon mass which was biopsied, multiple polyps which were remove, a "52m" polyp in the recto-sigmoid which was biopsied. We have been asked to evaluate for surgery.  Currently the patient is prepped and remains on clears.  The patient reports constipation over the last 4 weeks.  Also developed profound fatigue at this time which persisted.  Denies nausea, vomiting, melena or hematochezia. Denies recent weight loss.  Denies a family history of colon cancer.  Past Medical History  Diagnosis Date  . Essential hypertension   . Type 2 diabetes mellitus ([facility]   . OSA (obstructive sleep apnea), pt with apnea during procedures 10/11/2011  . Complete heart block ([facility]     [employer] PPM, Dr. TLovena Le11/22/16  . Paroxysmal atrial fibrillation (HCC)   . History of cardiomyopathy     Nonischemic, possibly tachycardia mediated, LVEF 60-65% 04/2015  . CKD (chronic kidney disease) stage 3, GFR 30-59 ml/min     Past Surgical History  Procedure Laterality Date  . No past surgeries    . Tee without cardioversion  10/10/2011    Procedure: TRANSESOPHAGEAL ECHOCARDIOGRAM (TEE);  Surgeon: MSanda Klein MD;  [place] [facility]  Service: Cardiovascular;  Laterality: N/A;  . Cardioversion  10/11/2011    Procedure: CARDIOVERSION;  Surgeon: DLeonie Man MD;  [place] [facility]  Service: Cardiovascular;  Laterality: N/A;  . Nuclear stress  11/01/2011    Severe global hypokinesis,dilated ventricle  . Ep implantable device N/A 05/02/2015     Procedure: BiV Pacemaker Insertion CRT-P;  Surgeon: GEvans Lance MD;  [facility];  Service: Cardiovascular;  Laterality: N/A;  . Esophagogastroduodenoscopy N/A 11/05/2015    Procedure: ESOPHAGOGASTRODUODENOSCOPY (EGD);  Surgeon: RRonald Lobo MD;  [facility];  Service: Endoscopy;  Laterality: N/A;  . Colonoscopy N/A 11/05/2015    Procedure: COLONOSCOPY;  Surgeon: RRonald Lobo MD;  [facility];  Service: Endoscopy;  Laterality: N/A;    Family History  Problem Relation Age of Onset  . Colon cancer Mother   . Kidney disease Father     ESRF/HD, deceased    Social History:  reports that he has never smoked. He has never used smokeless tobacco. He reports that he does not drink alcohol or use illicit drugs.  Allergies:  Allergies  Allergen Reactions  . Amiodarone Shortness Of Breath    Pulmonary toxicity with amiodarone    Medications:  Scheduled Meds: . carvedilol  12.5 mg Oral q morning - 10a  . doxazosin  8 mg Oral QHS  . insulin aspart  0-9 Units Subcutaneous Q4H  . pantoprazole  40 mg Oral BID AC  . polyethylene glycol  17 g Oral BID  . rosuvastatin  20 mg Oral q1800  . sucralfate  1 g Oral QID   Continuous Infusions:  PRN Meds:.acetaminophen **OR** acetaminophen, albuterol, ondansetron **OR** ondansetron (ZOFRAN) IV, zolpidem   Results for orders placed or performed during the hospital encounter of 11/03/15 (from the past 48 hour(s))  Glucose, capillary  Status: Abnormal   Collection Time: 11/05/15 11:49 AM  Result Value Ref Range   Glucose-Capillary 164 (H) 65 - 99 mg/dL  Prepare RBC     Status: None   Collection Time: 11/05/15 12:03 PM  Result Value Ref Range   Order Confirmation ORDER PROCESSED BY BLOOD BANK   Glucose, capillary     Status: Abnormal   Collection Time: 11/05/15  4:33 PM  Result Value Ref Range   Glucose-Capillary 148 (H) 65 - 99 mg/dL  Glucose, capillary     Status: Abnormal   Collection Time:  11/05/15  8:31 PM  Result Value Ref Range   Glucose-Capillary 203 (H) 65 - 99 mg/dL   Comment 1 Notify RN    Comment 2 Document in Chart   Glucose, capillary     Status: Abnormal   Collection Time: 11/05/15 11:32 PM  Result Value Ref Range   Glucose-Capillary 153 (H) 65 - 99 mg/dL   Comment 1 Notify RN    Comment 2 Document in Chart   Glucose, capillary     Status: Abnormal   Collection Time: 11/06/15  3:19 AM  Result Value Ref Range   Glucose-Capillary 119 (H) 65 - 99 mg/dL  CBC     Status: Abnormal   Collection Time: 11/06/15  3:47 AM  Result Value Ref Range   WBC 7.1 4.0 - 10.5 K/uL   RBC 3.61 (L) 4.22 - 5.81 MIL/uL   Hemoglobin 7.9 (L) 13.0 - 17.0 g/dL   HCT 25.5 (L) 39.0 - 52.0 %   MCV 70.6 (L) 78.0 - 100.0 fL   MCH 21.9 (L) 26.0 - 34.0 pg   MCHC 31.0 30.0 - 36.0 g/dL   RDW 22.0 (H) 11.5 - 15.5 %   Platelets 240 150 - 400 K/uL  Comprehensive metabolic panel     Status: Abnormal   Collection Time: 11/06/15  3:47 AM  Result Value Ref Range   Sodium 137 135 - 145 mmol/L   Potassium 4.0 3.5 - 5.1 mmol/L   Chloride 107 101 - 111 mmol/L   CO2 26 22 - 32 mmol/L   Glucose, Bld 124 (H) 65 - 99 mg/dL   BUN 23 (H) 6 - 20 mg/dL   Creatinine, Ser 2.04 (H) 0.61 - 1.24 mg/dL   Calcium 9.6 8.9 - 10.3 mg/dL   Total Protein 5.9 (L) 6.5 - 8.1 g/dL   Albumin 2.8 (L) 3.5 - 5.0 g/dL   AST 17 15 - 41 U/L   ALT 13 (L) 17 - 63 U/L   Alkaline Phosphatase 55 38 - 126 U/L   Total Bilirubin 1.4 (H) 0.3 - 1.2 mg/dL   GFR calc non Af Amer 30 (L) >60 mL/min   GFR calc Af Amer 35 (L) >60 mL/min    Comment: (NOTE) The eGFR has been calculated using the CKD EPI equation. This calculation has not been validated in all clinical situations. eGFR's persistently <60 mL/min signify possible Chronic Kidney Disease.    Anion gap 4 (L) 5 - 15  CEA     Status: None   Collection Time: 11/06/15  3:47 AM  Result Value Ref Range   CEA 1.2 0.0 - 4.7 ng/mL    Comment: (NOTE)       Roche ECLIA methodology        Nonsmokers  <3.9  Smokers     <5.6 Performed At: [facility] [place], [place] [id number] Lindon Romp MD WU:[id number]   Glucose, capillary     Status: Abnormal   Collection Time: 11/06/15  6:14 AM  Result Value Ref Range   Glucose-Capillary 144 (H) 65 - 99 mg/dL  Prepare RBC     Status: None   Collection Time: 11/06/15  9:02 AM  Result Value Ref Range   Order Confirmation ORDER PROCESSED BY BLOOD BANK   CBC     Status: Abnormal   Collection Time: 11/06/15  3:04 PM  Result Value Ref Range   WBC 7.4 4.0 - 10.5 K/uL   RBC 4.11 (L) 4.22 - 5.81 MIL/uL   Hemoglobin 9.1 (L) 13.0 - 17.0 g/dL   HCT 29.8 (L) 39.0 - 52.0 %   MCV 72.5 (L) 78.0 - 100.0 fL   MCH 22.1 (L) 26.0 - 34.0 pg   MCHC 30.5 30.0 - 36.0 g/dL   RDW 22.4 (H) 11.5 - 15.5 %   Platelets 239 150 - 400 K/uL  Glucose, capillary     Status: Abnormal   Collection Time: 11/06/15  4:58 PM  Result Value Ref Range   Glucose-Capillary 174 (H) 65 - 99 mg/dL   Comment 1 Notify RN   Glucose, capillary     Status: Abnormal   Collection Time: 11/06/15  8:22 PM  Result Value Ref Range   Glucose-Capillary 163 (H) 65 - 99 mg/dL   Comment 1 Notify RN    Comment 2 Document in Chart   Glucose, capillary     Status: Abnormal   Collection Time: 11/07/15 12:57 AM  Result Value Ref Range   Glucose-Capillary 145 (H) 65 - 99 mg/dL   Comment 1 Notify RN    Comment 2 Document in Chart   CBC     Status: Abnormal   Collection Time: 11/07/15  3:40 AM  Result Value Ref Range   WBC 7.6 4.0 - 10.5 K/uL   RBC 3.88 (L) 4.22 - 5.81 MIL/uL   Hemoglobin 8.7 (L) 13.0 - 17.0 g/dL   HCT 28.3 (L) 39.0 - 52.0 %   MCV 72.9 (L) 78.0 - 100.0 fL   MCH 22.4 (L) 26.0 - 34.0 pg   MCHC 30.7 30.0 - 36.0 g/dL   RDW 22.9 (H) 11.5 - 15.5 %   Platelets 206 150 - 400 K/uL  Comprehensive metabolic panel     Status: Abnormal   Collection Time: 11/07/15  3:40 AM  Result Value Ref Range   Sodium  136 135 - 145 mmol/L   Potassium 4.2 3.5 - 5.1 mmol/L   Chloride 107 101 - 111 mmol/L   CO2 23 22 - 32 mmol/L   Glucose, Bld 142 (H) 65 - 99 mg/dL   BUN 14 6 - 20 mg/dL   Creatinine, Ser 1.76 (H) 0.61 - 1.24 mg/dL   Calcium 9.7 8.9 - 10.3 mg/dL   Total Protein 6.1 (L) 6.5 - 8.1 g/dL   Albumin 2.8 (L) 3.5 - 5.0 g/dL   AST 17 15 - 41 U/L   ALT 13 (L) 17 - 63 U/L   Alkaline Phosphatase 57 38 - 126 U/L   Total Bilirubin 1.6 (H) 0.3 - 1.2 mg/dL   GFR calc non Af Amer 36 (L) >60 mL/min   GFR calc Af Amer 42 (L) >60 mL/min    Comment: (NOTE) The eGFR has been calculated using the CKD EPI equation. This calculation has not  been validated in all clinical situations. eGFR's persistently <60 mL/min signify possible Chronic Kidney Disease.    Anion gap 6 5 - 15  Glucose, capillary     Status: Abnormal   Collection Time: 11/07/15  4:36 AM  Result Value Ref Range   Glucose-Capillary 140 (H) 65 - 99 mg/dL   Comment 1 Notify RN    Comment 2 Document in Chart   Glucose, capillary     Status: Abnormal   Collection Time: 11/07/15  7:51 AM  Result Value Ref Range   Glucose-Capillary 160 (H) 65 - 99 mg/dL   Comment 1 Notify RN     Dg Abd Portable 1v  11/05/2015  CLINICAL DATA:  75 year old male with history of colonic neoplasm. EXAM: PORTABLE ABDOMEN - 1 VIEW COMPARISON:  No priors. FINDINGS: Several nondilated gas-filled loops of small bowel are noted throughout the central abdomen. No pathologic dilatation of small bowel or colon. No gross evidence of pneumoperitoneum on these supine images. Clips are seen projecting over the left side of the epigastric region and overlying the right upper quadrant. IMPRESSION: 1. Nonspecific, nonobstructive bowel gas pattern. 2. No pneumoperitoneum. Electronically Signed   By: Vinnie Langton M.D.   On: 11/05/2015 17:34    Review of Systems  Constitutional: Positive for malaise/fatigue. Negative for fever, chills, weight loss and diaphoresis.  Eyes: Negative  for blurred vision, double vision, photophobia, pain, discharge and redness.  Respiratory: Negative for cough, hemoptysis, sputum production, shortness of breath and wheezing.   Cardiovascular: Negative for chest pain, palpitations, orthopnea, claudication, leg swelling and PND.  Gastrointestinal: Positive for constipation. Negative for heartburn, nausea, vomiting, abdominal pain, diarrhea, blood in stool and melena.  Genitourinary: Negative for dysuria, urgency, frequency, hematuria and flank pain.  Musculoskeletal: Negative for myalgias, back pain, joint pain, falls and neck pain.  Neurological: Negative for dizziness, tingling, tremors, sensory change, speech change, focal weakness, seizures, loss of consciousness and weakness.   Blood pressure 155/63, pulse 73, temperature 98.9 F (37.2 C), temperature source Oral, resp. rate 18, height _0  (1.905 m), weight 118.298 kg (260 lb 12.8 oz), SpO2 98 %. Physical Exam  Constitutional: He is oriented to person, place, and time. He appears well-developed and well-nourished. No distress.  Cardiovascular: Normal rate, regular rhythm, normal heart sounds and intact distal pulses.  Exam reveals no gallop and no friction rub.   No murmur heard. Respiratory: Effort normal and breath sounds normal. No respiratory distress. He has no wheezes. He has no rales. He exhibits no tenderness.  GI: Soft. Bowel sounds are normal. He exhibits no distension and no mass. There is no tenderness. There is no rebound and no guarding.  Musculoskeletal: Normal range of motion. He exhibits no edema or tenderness.  Neurological: He is oriented to person, place, and time.  Skin: Skin is warm and dry. No rash noted. He is not diaphoretic. No erythema. No pallor.  Psychiatric: He has a normal mood and affect. His behavior is normal. Judgment and thought content normal.    Assessment/Plan: Transverse colon mass Recto-sigmoid polyps ABLA No final surgical recommendations can  be made until we have pathology from both the mass and polyp.  Would leave him on clears for now, may add resource breeze, but Ill leave that up to primary team given diabetes.  I spoke with the patient and his daughter.  Will follow up closely for further recommendations.  PAF, Xarelto on hold NICMP DM II HTN Complete heart block s/p pacemaker Acute on chronic  kidney disease  Jeydi Klingel, [place] ANP-BC 11/07/2015, 11:20 AM

## 2015-11-07 NOTE — Progress Notes (Signed)
Awaiting surgical consultation; I will revisit tomorrow.

## 2015-11-08 ENCOUNTER — Ambulatory Visit: Payer: Medicare Other | Admitting: Cardiology

## 2015-11-08 ENCOUNTER — Inpatient Hospital Stay (HOSPITAL_COMMUNITY): Payer: Medicare Other

## 2015-11-08 DIAGNOSIS — E785 Hyperlipidemia, unspecified: Secondary | ICD-10-CM

## 2015-11-08 DIAGNOSIS — N289 Disorder of kidney and ureter, unspecified: Secondary | ICD-10-CM

## 2015-11-08 LAB — GLUCOSE, CAPILLARY
GLUCOSE-CAPILLARY: 131 mg/dL — AB (ref 65–99)
GLUCOSE-CAPILLARY: 147 mg/dL — AB (ref 65–99)
GLUCOSE-CAPILLARY: 152 mg/dL — AB (ref 65–99)
GLUCOSE-CAPILLARY: 159 mg/dL — AB (ref 65–99)
Glucose-Capillary: 113 mg/dL — ABNORMAL HIGH (ref 65–99)
Glucose-Capillary: 160 mg/dL — ABNORMAL HIGH (ref 65–99)

## 2015-11-08 LAB — COMPREHENSIVE METABOLIC PANEL
ALBUMIN: 2.8 g/dL — AB (ref 3.5–5.0)
ALT: 13 U/L — AB (ref 17–63)
AST: 20 U/L (ref 15–41)
Alkaline Phosphatase: 53 U/L (ref 38–126)
Anion gap: 6 (ref 5–15)
BUN: 11 mg/dL (ref 6–20)
CHLORIDE: 106 mmol/L (ref 101–111)
CO2: 23 mmol/L (ref 22–32)
CREATININE: 1.82 mg/dL — AB (ref 0.61–1.24)
Calcium: 10 mg/dL (ref 8.9–10.3)
GFR calc non Af Amer: 35 mL/min — ABNORMAL LOW (ref >60)
GFR, EST AFRICAN AMERICAN: 40 mL/min — AB (ref >60)
GLUCOSE: 114 mg/dL — AB (ref 65–99)
Potassium: 4.3 mmol/L (ref 3.5–5.1)
SODIUM: 135 mmol/L (ref 135–145)
Total Bilirubin: 1.7 mg/dL — ABNORMAL HIGH (ref 0.3–1.2)
Total Protein: 6.2 g/dL — ABNORMAL LOW (ref 6.5–8.1)

## 2015-11-08 LAB — CBC
HCT: 28.1 % — ABNORMAL LOW (ref 39.0–52.0)
HEMOGLOBIN: 8.5 g/dL — AB (ref 13.0–17.0)
MCH: 21.8 pg — AB (ref 26.0–34.0)
MCHC: 30.2 g/dL (ref 30.0–36.0)
MCV: 72.1 fL — ABNORMAL LOW (ref 78.0–100.0)
PLATELETS: 219 10*3/uL (ref 150–400)
RBC: 3.9 MIL/uL — AB (ref 4.22–5.81)
RDW: 23 % — ABNORMAL HIGH (ref 11.5–15.5)
WBC: 5.9 10*3/uL (ref 4.0–10.5)

## 2015-11-08 LAB — PREALBUMIN: Prealbumin: 17.9 mg/dL — ABNORMAL LOW (ref 18–38)

## 2015-11-08 NOTE — Progress Notes (Addendum)
[facility] PROGRESS NOTE  Ernest Lara [id number] DOB: 18-Jul-1940 DOA: 11/03/2015   PCP: [name], MD     Assessment/Plan: Principal Problem:   GI bleed Active Problems:   DM (diabetes mellitus), type 2    HTN (hypertension)   PAF (paroxysmal atrial fibrillation), recent DCCV 10/11/11 to SR.   Hyperlipidemia   Complete heart block (HCC)   Acute blood loss anemia   Acute kidney injury superimposed on chronic kidney disease (HCC)   Colon tumor   Iron deficiency anemia   Chronic blood loss anemia   75 y.o. male with medical history significant of HTN, HLD, PAF on xarelto, Systolic CHF EF [id number] in 04/2015, CKD stage III, diabetes mellitus type 2, s/p PM; who presents with approximately 3 weeks of progressively worsening weakness. Upon admission patient was seen to be afebrile with blood pressure initially noted to be as low as 99/53. All other vital signs within normal limits. Lab work revealed a hemoglobin of 4.6. Admitted for workup of his anemia and weakness. 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  Continue to hold Xarelto, this may not be continued due to his renal function, he may need an alternative anticoagulant prior to discharge , INR supratherapeutic on admission, reversed with FFP GI recommends general surgery consultation for further evaluation of possible malignancy in the transverse colon Status post transfusion of 6 units of packed red blood cells, repeat CBC 9.1> 8.7 Continue Protonix  ,From a cardiac perspective he should be able to proceed with surgery without further cardiac specific testing per cardiology. Continue Coreg perioperatively CEA normal, CT scan chest abdomen and pelvis does not show any evidence of  metastatic disease Defer further surgical plan to surgery Would   Request transfer to surgery service as cardiology  already on board and managing patient's cardiac issues,[name], MD would continue following the patient  ultimately need another colonoscopy, post-operatively, in about one year with Dr. [name]    Acute blood loss anemia: Patient Presents with weakness. Gives history of every other day Aleve/on Xarelto  .baseline hemoglobin around 10.5-11.   Status post 6 units of packed red blood cells and 2 units of FFP  Hemoglobin around 8.5   Iron deficiency Start Ferrous sulfate supplementation when able   Paroxysmal Atrial fibrillation/ flutter on chronic anticoagulation: Patient currently in sinus rhythm heart rates controlled less than 100. CHADS2VASC=3 - Held Xarelto , this may need to be discontinued given GFR of 23 - Continue Coreg    Acute kidney injury on chronic kidney disease stage III: Baseline creatinine noted to be somewhere around 1.4-1.9 upon review of previous admissions. However, creatinine acutely elevated to 2.92 with a BUN of 44 on admission. 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  Hyperlipidemia - Continue Crestor   BPH - Continue  doxazosin  Insomnia - Benadryl prn   DVT prophylaxsis SCDs  Code Status:  Full code     Family Communication: Discussed in detail with the patient/daughter, all imaging results, lab results explained to the patient   Disposition Plan:  Pending general surgery consultation    Consultants:  Gastroenterology  Cardiology  Gen. surgery  Procedures:  None  Antibiotics: Anti-infectives    None         HPI/Subjective: Had some diarrhea this morning, discontinued MiraLAX, patient is ambulatory   Objective: Filed Vitals:   11/07/15 0444 11/07/15 0926 11/07/15 2050 11/08/15 0454  BP: 138/61 155/63 143/65 155/61  Pulse: 76 73 65 68  Temp: 99.1 F (37.3 C) 98.9 F (37.2 C) 98.3 F (36.8 C) 98.7 F (37.1 C)  TempSrc: Oral Oral Oral Oral  Resp: 18 18 18 18   Height:      Weight: 118.298 kg (260 lb 12.8 oz)   116.983 kg (257 lb 14.4 oz)  SpO2: 96% 98% 100% 97%    Intake/Output Summary (Last 24 hours) at 11/08/15 1021 Last data filed at 11/08/15 0938  Gross per 24 hour  Intake   2280 ml  Output    325 ml  Net   1955 ml     General exam: Appears calm and comfortable  Respiratory system: Clear to auscultation. Respiratory effort normal. Cardiovascular system: S1 & S2 heard, RRR. No JVD, murmurs, rubs, gallops or clicks. No pedal edema. Gastrointestinal system: Abdomen is nondistended, soft and nontender. No organomegaly or masses felt. Normal bowel sounds heard. Central nervous system: Alert and oriented. No focal neurological deficits. Extremities: Symmetric 5 x 5 power. Skin: No rashes, lesions or ulcers Psychiatry: Judgement and insight appear normal. Mood & affect appropriate.     Data Reviewed: I have personally reviewed following labs and imaging studies  Micro Results No results found for this or any previous visit (from the past 240 hour(s)).  Radiology Reports Dg Chest 2 View  11/03/2015  CLINICAL DATA:  Shortness of breath. EXAM: CHEST  2 VIEW  COMPARISON:  May 03, 2015. FINDINGS: The heart size and mediastinal contours are within normal limits. Both lungs are clear. No pneumothorax or pleural effusion is noted. Left-sided pacemaker is unchanged in position. The visualized skeletal structures are unremarkable. IMPRESSION: No active cardiopulmonary disease. Electronically Signed   By: Marijo Conception, M.D.   On: 11/03/2015 19:30   Ct Chest W Contrast  11/08/2015  CLINICAL DATA:  History of colon lesion, cardiomyopathy, chronic kidney disease, diabetes and hypertension EXAM: CT CHEST, ABDOMEN, AND PELVIS WITH CONTRAST TECHNIQUE: Multidetector CT imaging of the chest, abdomen and pelvis was performed following the standard protocol during bolus administration of intravenous contrast. CONTRAST:  183mL ISOVUE-300 IOPAMIDOL (ISOVUE-300) INJECTION 61% COMPARISON:  Chest x-ray of 11/03/2015 FINDINGS: CT CHEST On lung window images, no evidence of lung metastasis is seen. There are small bilateral pleural effusions present. In addition there are foci of higher attenuation within both lower lobes posteriorly most suspicious for prior aspiration. There are somewhat prominent interstitial markings to the pleura as well which could represent mild changes of interstitial edema. There are degenerative changes throughout the thoracic spine, in the shoulders, and in the sternoclavicular joints. On lung window images, the thyroid gland contains  a few small low-attenuation nodules of less than 1 cm in size, of doubtful clinical significance in this age patient. The thoracic aorta opacifies with no significant abnormality noted. The pulmonary arteries also are faintly opacify with no central abnormality evident. The heart is mildly enlarged and there is a small pericardial effusion present. Pacer wires are noted. CT ABDOMEN AND PELVIS The liver enhances with no focal abnormality and no ductal dilatation is seen. No calcified gallstones are noted. The pancreas is  normal in size in the pancreatic duct is not dilated. There may be a small lipoma within the third portion of the duodenum. The adrenal glands and the spleen are unremarkable. The stomach is decompressed. There is a small left lower pole renal calculus of approximately 5 mm. No hydronephrosis is seen. On delayed images the pelvocaliceal systems are unremarkable. Small low-attenuation structures are noted bilaterally involving both kidneys most consistent with small cysts but difficult to characterize on this study. Ultrasound may be helpful if further assessment is warranted clinically. The proximal ureters are normal in caliber. The abdominal aorta is normal in caliber with moderate atherosclerotic change. No adenopathy is seen. The urinary bladder is not optimally distended but no abnormality is seen. The urinary bladder is slightly thick walled, most likely due to a degree of bladder outlet obstruction caused by a significant enlarged prostate measuring 6.5 x 6.2 cm. The colon is largely decompressed and no obvious colonic mass is seen. Areas of contraction i.e. in the distal transverse colon near the splenic flexure of the could conceivably represent a colonic lesion, but this area is poorly distended. It is noted that within the ascending colon near the hepatic flexure of colon there is and apparent lipoma of 17 mm in diameter with attenuation of -68 age U. scattered diverticula present within the rectosigmoid colon. 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 Also there is significant degenerative change involving the facet joints of the entire lumbar spine. IMPRESSION: 1. Cardiomegaly, small pericardial effusion, small bilateral pleural effusions, and somewhat prominent interstitial markings suggest mild interstitial edema. 2. No evidence of metastatic involvement of the chest, abdomen, or pelvis. 3. The colon is largely decompressed. No  obvious mass is seen other than an apparent lipoma within the ascending colon of 17 mm in diameter. 4. There is an area of poor distension of the distal transverse colon near the splenic flexure and a colonic lesion cannot be excluded in that region. 5. 5 mm left lower pole renal calculus without obstruction. Electronically Signed   By: Ivar Drape M.D.   On: 11/08/2015 08:20   Ct Abdomen Pelvis W Contrast  11/08/2015  CLINICAL DATA:  History of colon lesion, cardiomyopathy, chronic kidney disease, diabetes and hypertension EXAM: CT CHEST, ABDOMEN, AND PELVIS WITH CONTRAST TECHNIQUE: Multidetector CT imaging of the chest, abdomen and pelvis was performed following the standard protocol during bolus administration of intravenous contrast. CONTRAST:  191mL ISOVUE-300 IOPAMIDOL (ISOVUE-300) INJECTION 61% COMPARISON:  Chest x-ray of 11/03/2015 FINDINGS: CT CHEST On lung window images, no evidence of lung metastasis is seen. There are small bilateral pleural effusions present. In addition there are foci of higher attenuation within both lower lobes posteriorly most suspicious for prior aspiration. There are somewhat prominent interstitial markings to the pleura as well which could represent mild changes of interstitial edema. There are degenerative changes throughout the thoracic spine, in the shoulders, and in the sternoclavicular joints. On lung window images, the thyroid gland  contains a few small low-attenuation nodules of less than 1 cm in size, of doubtful clinical significance in this age patient. The thoracic aorta opacifies with no significant abnormality noted. The pulmonary arteries also are faintly opacify with no central abnormality evident. The heart is mildly enlarged and there is a small pericardial effusion present. Pacer wires are noted. CT ABDOMEN AND PELVIS The liver enhances with no focal abnormality and no ductal dilatation is seen. No calcified gallstones are noted. The pancreas is normal in  size in the pancreatic duct is not dilated. There may be a small lipoma within the third portion of the duodenum. The adrenal glands and the spleen are unremarkable. The stomach is decompressed. There is a small left lower pole renal calculus of approximately 5 mm. No hydronephrosis is seen. On delayed images the pelvocaliceal systems are unremarkable. Small low-attenuation structures are noted bilaterally involving both kidneys most consistent with small cysts but difficult to characterize on this study. Ultrasound may be helpful if further assessment is warranted clinically. The proximal ureters are normal in caliber. The abdominal aorta is normal in caliber with moderate atherosclerotic change. No adenopathy is seen. The urinary bladder is not optimally distended but no abnormality is seen. The urinary bladder is slightly thick walled, most likely due to a degree of bladder outlet obstruction caused by a significant enlarged prostate measuring 6.5 x 6.2 cm. The colon is largely decompressed and no obvious colonic mass is seen. Areas of contraction i.e. in the distal transverse colon near the splenic flexure of the could conceivably represent a colonic lesion, but this area is poorly distended. It is noted that within the ascending colon near the hepatic flexure of colon there is and apparent lipoma of 17 mm in diameter with attenuation of -37 age U. scattered diverticula present within the rectosigmoid colon. 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 Also there is significant degenerative change involving the facet joints of the entire lumbar spine. IMPRESSION: 1. Cardiomegaly, small pericardial effusion, small bilateral pleural effusions, and somewhat prominent interstitial markings suggest mild interstitial edema. 2. No evidence of metastatic involvement of the chest, abdomen, or pelvis. 3. The colon is largely decompressed. No obvious mass  is seen other than an apparent lipoma within the ascending colon of 17 mm in diameter. 4. There is an area of poor distension of the distal transverse colon near the splenic flexure and a colonic lesion cannot be excluded in that region. 5. 5 mm left lower pole renal calculus without obstruction. Electronically Signed   By: Ivar Drape M.D.   On: 11/08/2015 08:20   Dg Abd Portable 1v  11/05/2015  CLINICAL DATA:  75 year old male with history of colonic neoplasm. EXAM: PORTABLE ABDOMEN - 1 VIEW COMPARISON:  No priors. FINDINGS: Several nondilated gas-filled loops of small bowel are noted throughout the central abdomen. No pathologic dilatation of small bowel or colon. No gross evidence of pneumoperitoneum on these supine images. Clips are seen projecting over the left side of the epigastric region and overlying the right upper quadrant. IMPRESSION: 1. Nonspecific, nonobstructive bowel gas pattern. 2. No pneumoperitoneum. Electronically Signed   By: Vinnie Langton M.D.   On: 11/05/2015 17:34     CBC  Recent Labs Lab 11/05/15 0452 11/06/15 0347 11/06/15 1504 11/07/15 0340 11/08/15 0445  WBC 6.3 7.1 7.4 7.6 5.9  HGB 7.4* 7.9* 9.1* 8.7* 8.5*  HCT 24.2* 25.5* 29.8* 28.3* 28.1*  PLT 251 240 239 206  219  MCV 69.3* 70.6* 72.5* 72.9* 72.1*  MCH 21.2* 21.9* 22.1* 22.4* 21.8*  MCHC 30.6 31.0 30.5 30.7 30.2  RDW 21.8* 22.0* 22.4* 22.9* 23.0*    Chemistries   Recent Labs Lab 11/03/15 1913 11/05/15 0452 11/06/15 0347 11/07/15 0340 11/08/15 0445  NA 135 139 137 136 135  K 4.1 3.6 4.0 4.2 4.3  CL 99* 105 107 107 106  CO2 23 25 26 23 23   GLUCOSE 279* 137* 124* 142* 114*  BUN 44* 37* 23* 14 11  CREATININE 2.92* 2.60* 2.04* 1.76* 1.82*  CALCIUM 9.9 9.8 9.6 9.7 10.0  AST  --  18 17 17 20   ALT  --  14* 13* 13* 13*  ALKPHOS  --  60 55 57 53  BILITOT  --  1.5* 1.4* 1.6* 1.7*    ------------------------------------------------------------------------------------------------------------------ estimated creatinine clearance is 49.1 mL/min (by C-G formula based on Cr of 1.82). ------------------------------------------------------------------------------------------------------------------ No results for input(s): HGBA1C in the last 72 hours. ------------------------------------------------------------------------------------------------------------------ No results for input(s): CHOL, HDL, LDLCALC, TRIG, CHOLHDL, LDLDIRECT in the last 72 hours. ------------------------------------------------------------------------------------------------------------------ No results for input(s): TSH, T4TOTAL, T3FREE, THYROIDAB in the last 72 hours.  Invalid input(s): FREET3 ------------------------------------------------------------------------------------------------------------------ No results for input(s): VITAMINB12, FOLATE, FERRITIN, TIBC, IRON, RETICCTPCT in the last 72 hours.  Coagulation profile  Recent Labs Lab 11/03/15 2209 11/04/15 1239 11/05/15 0452  INR 3.85* 2.07* 1.54*    No results for input(s): DDIMER in the last 72 hours.  Cardiac Enzymes  Recent Labs Lab 11/03/15 1913  TROPONINI <0.03   ------------------------------------------------------------------------------------------------------------------ Invalid input(s): POCBNP   CBG:  Recent Labs Lab 11/07/15 1631 11/07/15 1958 11/07/15 2354 11/08/15 0446 11/08/15 0743  GLUCAP 164* 138* 138* 113* 131*       Studies: Ct Chest W Contrast  11/08/2015  CLINICAL DATA:  History of colon lesion, cardiomyopathy, chronic kidney disease, diabetes and hypertension EXAM: CT CHEST, ABDOMEN, AND PELVIS WITH CONTRAST TECHNIQUE: Multidetector CT imaging of the chest, abdomen and pelvis was performed following the standard protocol during bolus administration of intravenous contrast. CONTRAST:   144mL ISOVUE-300 IOPAMIDOL (ISOVUE-300) INJECTION 61% COMPARISON:  Chest x-ray of 11/03/2015 FINDINGS: CT CHEST On lung window images, no evidence of lung metastasis is seen. There are small bilateral pleural effusions present. In addition there are foci of higher attenuation within both lower lobes posteriorly most suspicious for prior aspiration. There are somewhat prominent interstitial markings to the pleura as well which could represent mild changes of interstitial edema. There are degenerative changes throughout the thoracic spine, in the shoulders, and in the sternoclavicular joints. On lung window images, the thyroid gland contains a few small low-attenuation nodules of less than 1 cm in size, of doubtful clinical significance in this age patient. The thoracic aorta opacifies with no significant abnormality noted. The pulmonary arteries also are faintly opacify with no central abnormality evident. The heart is mildly enlarged and there is a small pericardial effusion present. Pacer wires are noted. CT ABDOMEN AND PELVIS The liver enhances with no focal abnormality and no ductal dilatation is seen. No calcified gallstones are noted. The pancreas is normal in size in the pancreatic duct is not dilated. There may be a small lipoma within the third portion of the duodenum. The adrenal glands and the spleen are unremarkable. The stomach is decompressed. There is a small left lower pole renal calculus of approximately 5 mm. No hydronephrosis is seen. On delayed images the pelvocaliceal systems are unremarkable. Small low-attenuation structures are noted bilaterally involving both kidneys most  consistent with small cysts but difficult to characterize on this study. Ultrasound may be helpful if further assessment is warranted clinically. The proximal ureters are normal in caliber. The abdominal aorta is normal in caliber with moderate atherosclerotic change. No adenopathy is seen. The urinary bladder is not  optimally distended but no abnormality is seen. The urinary bladder is slightly thick walled, most likely due to a degree of bladder outlet obstruction caused by a significant enlarged prostate measuring 6.5 x 6.2 cm. The colon is largely decompressed and no obvious colonic mass is seen. Areas of contraction i.e. in the distal transverse colon near the splenic flexure of the could conceivably represent a colonic lesion, but this area is poorly distended. It is noted that within the ascending colon near the hepatic flexure of colon there is and apparent lipoma of 17 mm in diameter with attenuation of -25 age U. scattered diverticula present within the rectosigmoid colon. 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 Also there is significant degenerative change involving the facet joints of the entire lumbar spine. IMPRESSION: 1. Cardiomegaly, small pericardial effusion, small bilateral pleural effusions, and somewhat prominent interstitial markings suggest mild interstitial edema. 2. No evidence of metastatic involvement of the chest, abdomen, or pelvis. 3. The colon is largely decompressed. No obvious mass is seen other than an apparent lipoma within the ascending colon of 17 mm in diameter. 4. There is an area of poor distension of the distal transverse colon near the splenic flexure and a colonic lesion cannot be excluded in that region. 5. 5 mm left lower pole renal calculus without obstruction. Electronically Signed   By: Ivar Drape M.D.   On: 11/08/2015 08:20   Ct Abdomen Pelvis W Contrast  11/08/2015  CLINICAL DATA:  History of colon lesion, cardiomyopathy, chronic kidney disease, diabetes and hypertension EXAM: CT CHEST, ABDOMEN, AND PELVIS WITH CONTRAST TECHNIQUE: Multidetector CT imaging of the chest, abdomen and pelvis was performed following the standard protocol during bolus administration of intravenous contrast. CONTRAST:  14mL  ISOVUE-300 IOPAMIDOL (ISOVUE-300) INJECTION 61% COMPARISON:  Chest x-ray of 11/03/2015 FINDINGS: CT CHEST On lung window images, no evidence of lung metastasis is seen. There are small bilateral pleural effusions present. In addition there are foci of higher attenuation within both lower lobes posteriorly most suspicious for prior aspiration. There are somewhat prominent interstitial markings to the pleura as well which could represent mild changes of interstitial edema. There are degenerative changes throughout the thoracic spine, in the shoulders, and in the sternoclavicular joints. On lung window images, the thyroid gland contains a few small low-attenuation nodules of less than 1 cm in size, of doubtful clinical significance in this age patient. The thoracic aorta opacifies with no significant abnormality noted. The pulmonary arteries also are faintly opacify with no central abnormality evident. The heart is mildly enlarged and there is a small pericardial effusion present. Pacer wires are noted. CT ABDOMEN AND PELVIS The liver enhances with no focal abnormality and no ductal dilatation is seen. No calcified gallstones are noted. The pancreas is normal in size in the pancreatic duct is not dilated. There may be a small lipoma within the third portion of the duodenum. The adrenal glands and the spleen are unremarkable. The stomach is decompressed. There is a small left lower pole renal calculus of approximately 5 mm. No hydronephrosis is seen. On delayed images the pelvocaliceal systems are unremarkable. Small low-attenuation structures are noted bilaterally involving both kidneys  most consistent with small cysts but difficult to characterize on this study. Ultrasound may be helpful if further assessment is warranted clinically. The proximal ureters are normal in caliber. The abdominal aorta is normal in caliber with moderate atherosclerotic change. No adenopathy is seen. The urinary bladder is not optimally  distended but no abnormality is seen. The urinary bladder is slightly thick walled, most likely due to a degree of bladder outlet obstruction caused by a significant enlarged prostate measuring 6.5 x 6.2 cm. The colon is largely decompressed and no obvious colonic mass is seen. Areas of contraction i.e. in the distal transverse colon near the splenic flexure of the could conceivably represent a colonic lesion, but this area is poorly distended. It is noted that within the ascending colon near the hepatic flexure of colon there is and apparent lipoma of 17 mm in diameter with attenuation of -68 age U. scattered diverticula present within the rectosigmoid colon. 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 Also there is significant degenerative change involving the facet joints of the entire lumbar spine. IMPRESSION: 1. Cardiomegaly, small pericardial effusion, small bilateral pleural effusions, and somewhat prominent interstitial markings suggest mild interstitial edema. 2. No evidence of metastatic involvement of the chest, abdomen, or pelvis. 3. The colon is largely decompressed. No obvious mass is seen other than an apparent lipoma within the ascending colon of 17 mm in diameter. 4. There is an area of poor distension of the distal transverse colon near the splenic flexure and a colonic lesion cannot be excluded in that region. 5. 5 mm left lower pole renal calculus without obstruction. Electronically Signed   By: Ivar Drape M.D.   On: 11/08/2015 08:20      Lab Results  Component Value Date   HGBA1C 7.8* 05/01/2015   HGBA1C 7.5* 10/08/2011   Lab Results  Component Value Date   LDLCALC 15 10/09/2011   CREATININE 1.82* 11/08/2015       Scheduled Meds: . carvedilol  12.5 mg Oral q morning - 10a  . doxazosin  8 mg Oral QHS  . feeding supplement  1 Container Oral TID BM  . insulin aspart  0-9 Units Subcutaneous Q4H  .  pantoprazole  40 mg Oral BID AC  . rosuvastatin  20 mg Oral q1800  . sucralfate  1 g Oral QID   Continuous Infusions: . sodium chloride 50 mL/hr at 11/07/15 1415     LOS: 5 days    Time spent: >30 MINS    [facility]  [facility] Pager [phone]. If 7PM-7AM, please contact night-coverage at [email or web address], password [email or web address] 11/08/2015, 10:21 AM  LOS: 5 days

## 2015-11-08 NOTE — Progress Notes (Signed)
Appointment cancelled  Kerin Ransom PA-C 10/27/2018 4:53 PM

## 2015-11-08 NOTE — Progress Notes (Signed)
Subjective: No abdominal pain. No blood in stool.  Objective: Vital signs in last 24 hours: Temp:  [98.3 F (36.8 C)-98.9 F (37.2 C)] 98.7 F (37.1 C) (05/31 0454) Pulse Rate:  [65-73] 68 (05/31 0454) Resp:  [18] 18 (05/31 0454) BP: (143-155)/(61-65) 155/61 mmHg (05/31 0454) SpO2:  [97 %-100 %] 97 % (05/31 0454) Weight:  [116.983 kg (257 lb 14.4 oz)] 116.983 kg (257 lb 14.4 oz) (05/31 0454) Weight change: -1.315 kg (-2 lb 14.4 oz) Last BM Date: 11/07/15  PE: GEN:  Overweight, younger-appearing than stated age  Lab Results: CBC    Component Value Date/Time   WBC 5.9 11/08/2015 0445   RBC 3.90* 11/08/2015 0445   RBC 2.62* 11/03/2015 1956   HGB 8.5* 11/08/2015 0445   HCT 28.1* 11/08/2015 0445   PLT 219 11/08/2015 0445   MCV 72.1* 11/08/2015 0445   MCH 21.8* 11/08/2015 0445   MCHC 30.2 11/08/2015 0445   RDW 23.0* 11/08/2015 0445   LYMPHSABS 2.1 05/01/2015 1832   MONOABS 0.4 05/01/2015 1832   EOSABS 0.1 05/01/2015 1832   BASOSABS 0.0 05/01/2015 1832   CMP     Component Value Date/Time   NA 135 11/08/2015 0445   K 4.3 11/08/2015 0445   CL 106 11/08/2015 0445   CO2 23 11/08/2015 0445   GLUCOSE 114* 11/08/2015 0445   BUN 11 11/08/2015 0445   CREATININE 1.82* 11/08/2015 0445   CREATININE 1.43* 05/08/2015 1016   CALCIUM 10.0 11/08/2015 0445   PROT 6.2* 11/08/2015 0445   ALBUMIN 2.8* 11/08/2015 0445   AST 20 11/08/2015 0445   ALT 13* 11/08/2015 0445   ALKPHOS 53 11/08/2015 0445   BILITOT 1.7* 11/08/2015 0445   GFRNONAA 35* 11/08/2015 0445   GFRAA 40* 11/08/2015 0445   Studies/Results: Ct Chest W Contrast  11/08/2015  CLINICAL DATA:  History of colon lesion, cardiomyopathy, chronic kidney disease, diabetes and hypertension EXAM: CT CHEST, ABDOMEN, AND PELVIS WITH CONTRAST TECHNIQUE: Multidetector CT imaging of the chest, abdomen and pelvis was performed following the standard protocol during bolus administration of intravenous contrast. CONTRAST:  156mL ISOVUE-300  IOPAMIDOL (ISOVUE-300) INJECTION 61% COMPARISON:  Chest x-ray of 11/03/2015 FINDINGS: CT CHEST On lung window images, no evidence of lung metastasis is seen. There are small bilateral pleural effusions present. In addition there are foci of higher attenuation within both lower lobes posteriorly most suspicious for prior aspiration. There are somewhat prominent interstitial markings to the pleura as well which could represent mild changes of interstitial edema. There are degenerative changes throughout the thoracic spine, in the shoulders, and in the sternoclavicular joints. On lung window images, the thyroid gland contains a few small low-attenuation nodules of less than 1 cm in size, of doubtful clinical significance in this age patient. The thoracic aorta opacifies with no significant abnormality noted. The pulmonary arteries also are faintly opacify with no central abnormality evident. The heart is mildly enlarged and there is a small pericardial effusion present. Pacer wires are noted. CT ABDOMEN AND PELVIS The liver enhances with no focal abnormality and no ductal dilatation is seen. No calcified gallstones are noted. The pancreas is normal in size in the pancreatic duct is not dilated. There may be a small lipoma within the third portion of the duodenum. The adrenal glands and the spleen are unremarkable. The stomach is decompressed. There is a small left lower pole renal calculus of approximately 5 mm. No hydronephrosis is seen. On delayed images the pelvocaliceal systems are unremarkable. Small low-attenuation structures are  noted bilaterally involving both kidneys most consistent with small cysts but difficult to characterize on this study. Ultrasound may be helpful if further assessment is warranted clinically. The proximal ureters are normal in caliber. The abdominal aorta is normal in caliber with moderate atherosclerotic change. No adenopathy is seen. The urinary bladder is not optimally distended but  no abnormality is seen. The urinary bladder is slightly thick walled, most likely due to a degree of bladder outlet obstruction caused by a significant enlarged prostate measuring 6.5 x 6.2 cm. The colon is largely decompressed and no obvious colonic mass is seen. Areas of contraction i.e. in the distal transverse colon near the splenic flexure of the could conceivably represent a colonic lesion, but this area is poorly distended. It is noted that within the ascending colon near the hepatic flexure of colon there is and apparent lipoma of 17 mm in diameter with attenuation of -59 age U. scattered diverticula present within the rectosigmoid colon. 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 Also there is significant degenerative change involving the facet joints of the entire lumbar spine. IMPRESSION: 1. Cardiomegaly, small pericardial effusion, small bilateral pleural effusions, and somewhat prominent interstitial markings suggest mild interstitial edema. 2. No evidence of metastatic involvement of the chest, abdomen, or pelvis. 3. The colon is largely decompressed. No obvious mass is seen other than an apparent lipoma within the ascending colon of 17 mm in diameter. 4. There is an area of poor distension of the distal transverse colon near the splenic flexure and a colonic lesion cannot be excluded in that region. 5. 5 mm left lower pole renal calculus without obstruction. Electronically Signed   By: Ivar Drape M.D.   On: 11/08/2015 08:20   Ct Abdomen Pelvis W Contrast  11/08/2015  CLINICAL DATA:  History of colon lesion, cardiomyopathy, chronic kidney disease, diabetes and hypertension EXAM: CT CHEST, ABDOMEN, AND PELVIS WITH CONTRAST TECHNIQUE: Multidetector CT imaging of the chest, abdomen and pelvis was performed following the standard protocol during bolus administration of intravenous contrast. CONTRAST:  114mL ISOVUE-300 IOPAMIDOL  (ISOVUE-300) INJECTION 61% COMPARISON:  Chest x-ray of 11/03/2015 FINDINGS: CT CHEST On lung window images, no evidence of lung metastasis is seen. There are small bilateral pleural effusions present. In addition there are foci of higher attenuation within both lower lobes posteriorly most suspicious for prior aspiration. There are somewhat prominent interstitial markings to the pleura as well which could represent mild changes of interstitial edema. There are degenerative changes throughout the thoracic spine, in the shoulders, and in the sternoclavicular joints. On lung window images, the thyroid gland contains a few small low-attenuation nodules of less than 1 cm in size, of doubtful clinical significance in this age patient. The thoracic aorta opacifies with no significant abnormality noted. The pulmonary arteries also are faintly opacify with no central abnormality evident. The heart is mildly enlarged and there is a small pericardial effusion present. Pacer wires are noted. CT ABDOMEN AND PELVIS The liver enhances with no focal abnormality and no ductal dilatation is seen. No calcified gallstones are noted. The pancreas is normal in size in the pancreatic duct is not dilated. There may be a small lipoma within the third portion of the duodenum. The adrenal glands and the spleen are unremarkable. The stomach is decompressed. There is a small left lower pole renal calculus of approximately 5 mm. No hydronephrosis is seen. On delayed images the pelvocaliceal systems are unremarkable. Small low-attenuation structures  are noted bilaterally involving both kidneys most consistent with small cysts but difficult to characterize on this study. Ultrasound may be helpful if further assessment is warranted clinically. The proximal ureters are normal in caliber. The abdominal aorta is normal in caliber with moderate atherosclerotic change. No adenopathy is seen. The urinary bladder is not optimally distended but no  abnormality is seen. The urinary bladder is slightly thick walled, most likely due to a degree of bladder outlet obstruction caused by a significant enlarged prostate measuring 6.5 x 6.2 cm. The colon is largely decompressed and no obvious colonic mass is seen. Areas of contraction i.e. in the distal transverse colon near the splenic flexure of the could conceivably represent a colonic lesion, but this area is poorly distended. It is noted that within the ascending colon near the hepatic flexure of colon there is and apparent lipoma of 17 mm in diameter with attenuation of -38 age U. scattered diverticula present within the rectosigmoid colon. 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 Also there is significant degenerative change involving the facet joints of the entire lumbar spine. IMPRESSION: 1. Cardiomegaly, small pericardial effusion, small bilateral pleural effusions, and somewhat prominent interstitial markings suggest mild interstitial edema. 2. No evidence of metastatic involvement of the chest, abdomen, or pelvis. 3. The colon is largely decompressed. No obvious mass is seen other than an apparent lipoma within the ascending colon of 17 mm in diameter. 4. There is an area of poor distension of the distal transverse colon near the splenic flexure and a colonic lesion cannot be excluded in that region. 5. 5 mm left lower pole renal calculus without obstruction. Electronically Signed   By: Ivar Drape M.D.   On: 11/08/2015 08:20   Assessment:  1.  Anemia, likely at least in part to colon mass + multiple polyps. 2.  Multiple colonic polyps, adenomatous, removed. 3.  Sessile large rectosigmoid polyp, endoscopically unresectable, adenomatous on biopsies. 4.  Colonic lipoma. 5.  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  No evidence of metastatic disease in chest, abdomen, pelvis.  Plan:  1.  Clear liquids and Miralax for  now, pending surgical input. 2.  Defer further management to surgical team. 3.  Patient will ultimately need another colonoscopy, post-operatively, in about one year with Dr. [name]. 4.  No further GI recommendations at this time; will sign-off; please call with questions; thank you for the consultation.   Landry Dyke 11/08/2015, 9:08 AM   Pager [phone] If no answer or after 5 PM call [phone]

## 2015-11-08 NOTE — Progress Notes (Signed)
Patient Name: Ernest Lara Date of Encounter: 11/08/2015     Principal Problem:   GI bleed Active Problems:   DM (diabetes mellitus), type 2    HTN (hypertension)   PAF (paroxysmal atrial fibrillation), recent DCCV 10/11/11 to [facility].   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 Redmond Pulling about cancer and surgery plans  CURRENT MEDS . carvedilol  12.5 mg Oral q morning - 10a  . doxazosin  8 mg Oral QHS  . feeding supplement  1 Container Oral TID BM  . insulin aspart  0-9 Units Subcutaneous Q4H  . pantoprazole  40 mg Oral BID AC  . rosuvastatin  20 mg Oral q1800  . sucralfate  1 g Oral QID    OBJECTIVE  Filed Vitals:   11/07/15 0444 11/07/15 0926 11/07/15 2050 11/08/15 0454  BP: 138/61 155/63 143/65 155/61  Pulse: 76 73 65 68  Temp: 99.1 F (37.3 C) 98.9 F (37.2 C) 98.3 F (36.8 C) 98.7 F (37.1 C)  TempSrc: Oral Oral Oral Oral  Resp: 18 18 18 18   Height:      Weight: 260 lb 12.8 oz (118.298 kg)   257 lb 14.4 oz (116.983 kg)  SpO2: 96% 98% 100% 97%    Intake/Output Summary (Last 24 hours) at 11/08/15 1157 Last data filed at 11/08/15 0938  Gross per 24 hour  Intake   2280 ml  Output    325 ml  Net   1955 ml   Filed Weights   11/06/15 0648 11/07/15 0444 11/08/15 0454  Weight: 257 lb 14.4 oz (116.983 kg) 260 lb 12.8 oz (118.298 kg) 257 lb 14.4 oz (116.983 kg)    PHYSICAL EXAM  General: Pleasant, NAD. Neuro: Alert and oriented X 3. Moves all extremities spontaneously. Psych: Normal affect. HEENT:  Normal  Neck: Supple without bruits or JVD. Lungs:  Resp regular and unlabored, CTA. Heart: RRR no s3, s4, or murmurs. Abdomen: Soft, non-tender, non-distended, BS + x 4.  Extremities: No clubbing, cyanosis or edema. DP/PT/Radials 2+ and equal bilaterally.  Accessory  Clinical Findings  CBC  Recent Labs  11/07/15 0340 11/08/15 0445  WBC 7.6 5.9  HGB 8.7* 8.5*  HCT 28.3* 28.1*  MCV 72.9* 72.1*  PLT 206 [id number]   Basic Metabolic Panel  Recent Labs  11/07/15 0340 11/08/15 0445  NA 136 135  K 4.2 4.3  CL 107 106  CO2 23 23  GLUCOSE 142* 114*  BUN 14 11  CREATININE 1.76* 1.82*  CALCIUM 9.7 10.0   Liver Function Tests  Recent Labs  11/07/15 0340 11/08/15 0445  AST 17 20  ALT 13* 13*  ALKPHOS 57 53  BILITOT 1.6* 1.7*  PROT 6.1* 6.2*  ALBUMIN 2.8* 2.8*    TELE  V paced  Radiology/Studies  Dg Chest 2 View  11/03/2015  CLINICAL DATA:  Shortness of breath. EXAM: CHEST  2 VIEW COMPARISON:  May 03, 2015. FINDINGS: The heart size and mediastinal contours are within normal limits. Both lungs are clear. No pneumothorax or pleural effusion is noted. Left-sided pacemaker is unchanged in position. The visualized skeletal structures are unremarkable. IMPRESSION: No active cardiopulmonary disease. Electronically Signed   By: Marijo Conception, M.D.   On: 11/03/2015 19:30   Ct Chest W Contrast  11/08/2015  CLINICAL DATA:  History of colon lesion, cardiomyopathy, chronic kidney disease, diabetes and hypertension EXAM: CT CHEST, ABDOMEN, AND PELVIS WITH CONTRAST TECHNIQUE: Multidetector CT imaging of the chest, abdomen and pelvis was performed following the standard protocol during bolus administration of intravenous contrast. CONTRAST:  113mL ISOVUE-300 IOPAMIDOL (ISOVUE-300) INJECTION 61% COMPARISON:  Chest x-ray of 11/03/2015 FINDINGS: CT CHEST On lung window images, no evidence of lung metastasis is seen. There are small bilateral pleural effusions present. In addition there are foci of higher attenuation within both lower lobes posteriorly most suspicious for prior aspiration. There are somewhat prominent interstitial markings to the pleura as well which could represent mild changes of interstitial edema. There are degenerative changes throughout  the thoracic spine, in the shoulders, and in the sternoclavicular joints. On lung window images, the thyroid gland contains a few small low-attenuation nodules of less than 1 cm in size, of doubtful clinical significance in this age patient. The thoracic aorta opacifies with no significant abnormality noted. The pulmonary arteries also are faintly opacify with no central abnormality evident. The heart is mildly enlarged and there is a small pericardial effusion present. Pacer wires are noted. CT ABDOMEN AND PELVIS The liver enhances with no focal abnormality and no ductal dilatation is seen. No calcified gallstones are noted. The pancreas is normal in size in the pancreatic duct is not dilated. There may be a small lipoma within the third portion of the duodenum. The adrenal glands and the spleen are unremarkable. The stomach is decompressed. There is a small left lower pole renal calculus of approximately 5 mm. No hydronephrosis is seen. On delayed images the pelvocaliceal systems are unremarkable. Small low-attenuation structures are noted bilaterally involving both kidneys most consistent with small cysts but difficult to characterize on this study. Ultrasound may be helpful if further assessment is warranted clinically. The proximal ureters are normal in caliber. The abdominal aorta is normal in caliber with moderate atherosclerotic change. No adenopathy is seen. The urinary bladder is not optimally distended but no abnormality is seen. The urinary bladder is slightly thick walled, most likely due to a degree of bladder outlet obstruction caused by a significant enlarged prostate measuring 6.5 x 6.2 cm. The colon is largely decompressed and no obvious colonic mass is seen. Areas of contraction i.e. in the distal transverse colon near the splenic flexure of the could conceivably represent a colonic lesion, but this area is poorly distended. It is noted that within the ascending colon near the hepatic flexure of  colon there is and apparent lipoma of 17 mm in diameter with attenuation of -43 age U. scattered diverticula present within the rectosigmoid colon. 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 Also there is significant degenerative change involving the facet joints of the entire lumbar spine. IMPRESSION: 1. Cardiomegaly, small pericardial effusion, small bilateral pleural effusions, and somewhat prominent interstitial markings suggest mild interstitial edema. 2. No evidence of metastatic involvement of the chest, abdomen, or pelvis. 3. The colon is largely decompressed. No obvious mass is seen other than an apparent lipoma within the ascending colon of 17 mm in diameter. 4. There is an area of poor distension of the distal transverse colon near the splenic flexure and a colonic lesion cannot be excluded in that region. 5. 5 mm left lower pole renal calculus without obstruction. Electronically Signed   By: Ivar Drape M.D.   On: 11/08/2015 08:20   Ct Abdomen Pelvis W Contrast  11/08/2015  CLINICAL DATA:  History of colon lesion, cardiomyopathy, chronic kidney disease, diabetes and hypertension EXAM: CT CHEST, ABDOMEN, AND PELVIS WITH CONTRAST TECHNIQUE: Multidetector CT imaging of the chest, abdomen and pelvis was performed following the standard protocol during bolus administration of intravenous contrast. CONTRAST:  1101mL ISOVUE-300 IOPAMIDOL (ISOVUE-300) INJECTION 61% COMPARISON:  Chest x-ray of 11/03/2015 FINDINGS: CT CHEST On lung window images, no evidence of lung metastasis is seen. There are small bilateral pleural effusions present. In addition there are foci of higher attenuation within both lower lobes posteriorly most suspicious for prior aspiration. There are somewhat prominent interstitial markings to the pleura as well which could represent mild changes of interstitial edema. There are degenerative changes throughout the thoracic  spine, in the shoulders, and in the sternoclavicular joints. On lung window images, the thyroid gland contains a few small low-attenuation nodules of less than 1 cm in size, of doubtful clinical significance in this age patient. The thoracic aorta opacifies with no significant abnormality noted. The pulmonary arteries also are faintly opacify with no central abnormality evident. The heart is mildly enlarged and there is a small pericardial effusion present. Pacer wires are noted. CT ABDOMEN AND PELVIS The liver enhances with no focal abnormality and no ductal dilatation is seen. No calcified gallstones are noted. The pancreas is normal in size in the pancreatic duct is not dilated. There may be a small lipoma within the third portion of the duodenum. The adrenal glands and the spleen are unremarkable. The stomach is decompressed. There is a small left lower pole renal calculus of approximately 5 mm. No hydronephrosis is seen. On delayed images the pelvocaliceal systems are unremarkable. Small low-attenuation structures are noted bilaterally involving both kidneys most consistent with small cysts but difficult to characterize on this study. Ultrasound may be helpful if further assessment is warranted clinically. The proximal ureters are normal in caliber. The abdominal aorta is normal in caliber with moderate atherosclerotic change. No adenopathy is seen. The urinary bladder is not optimally distended but no abnormality is seen. The urinary bladder is slightly thick walled, most likely due to a degree of bladder outlet obstruction caused by a significant enlarged prostate measuring 6.5 x 6.2 cm. The colon is largely decompressed and no obvious colonic mass is seen. Areas of contraction i.e. in the distal transverse colon near the splenic flexure of the could conceivably represent a colonic lesion, but this area is poorly distended. It is noted that within the ascending colon near the hepatic flexure of colon there is  and apparent lipoma of 17 mm in diameter with attenuation of -47 age U. scattered diverticula present within the rectosigmoid colon. 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 Also there is significant degenerative change involving the facet joints of the entire lumbar spine. IMPRESSION: 1. Cardiomegaly, small pericardial effusion, small bilateral pleural effusions, and somewhat prominent interstitial markings suggest mild interstitial edema. 2. No evidence of metastatic involvement of the chest, abdomen, or pelvis. 3. The colon is largely decompressed. No obvious mass is seen other than an apparent lipoma within the ascending colon of 17 mm in diameter. 4. There is an area of poor distension of the distal transverse colon near the splenic flexure and a colonic lesion cannot be excluded in that region. 5. 5 mm left lower pole renal calculus without obstruction. Electronically Signed   By: Ivar Drape M.D.   On: 11/08/2015 08:20   Dg Abd Portable 1v  11/05/2015  CLINICAL DATA:  75 year old male with history of colonic neoplasm. EXAM: PORTABLE ABDOMEN - 1 VIEW COMPARISON:  No priors. FINDINGS: Several nondilated gas-filled loops of small bowel are noted throughout the central abdomen. No pathologic dilatation of small bowel or colon. No gross evidence of pneumoperitoneum on these supine images. Clips are seen projecting over the left side of the epigastric region and overlying the right upper quadrant. IMPRESSION: 1. Nonspecific, nonobstructive bowel gas pattern. 2. No pneumoperitoneum. Electronically Signed   By: Vinnie Langton M.D.   On: 11/05/2015 17:34    ASSESSMENT AND PLAN  75 yo male w/ hx PAF on Xarelto (CHADS2VASC=3), HTN, DM, CHB s/p BSci CRT-P, CKD III, remote NICM w/ EF now nl (echo 04/2015) who was admitted on 05/26 w/ GIB, Hgb 4.6 and found to have a malignant colon mass.  GI bleed/Colon cancer: he underwent a colonoscopy  by Dr. [name] on 5/28 and was found to have a transverse colon mass which was biopsied, multiple polyps which were removed and, a "10mm" polyp in the recto-sigmoid which was biopsied. 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 Surgery is seeing and will do surgery based on CT findings. Cleared from a cardiology perspective for surgery.   HTN (hypertension) - good control on current rx  PAF: with recent DCCV 10/11/11 to [place] stopped due to anemia and presumed GI bleed from ulcer and polyps. Stay off anticoagulation until bleeding sources have been removed/healed (a few weeks for the duodenal ulcers, hopefully surgery this admission for the colon masses/cancer). - continue coreg  HLD: continue statin  Complete heart block - s/p [employer] CRT-P device.  This was interrogated with one episode of NSVT and 99% RV/LV paced. Report in chart.   Acute kidney injury superimposed on chronic kidney disease ([place]) - initial BUN/CR 44/2.92. ACE held and creat around baseline. Plan is to restart Lotensin 40mg  daily but creat slightly up from yesterday 1.76--> 1.82  Signed, Angelena Form PA-C  Pager [phone]  I have seen and examined the patient along with Angelena Form PA-C.  I have reviewed the chart, notes and new data.  I agree with PA's note.  Key new complaints: no breathing difficulty, no palpitations Key examination changes: no signs of CHF Key new findings / data: normal pacemaker function, he is not pacemaker dependent (had normal AV conduction at time of device check), but has committed BiV pacing 100%. No recent atrial fibrillation. CT abdomen with no evidence of metastatic spread.  PLAN: Received contrast today, restart ACEi if creat OK tomorrow. Carvedilol should not be stopped periop to avoid rebound arrhythmia/HTN/CHF. May need IV metoprolol if intestinal transit fails to resume promptly after surgery. Low risk for major CV complications with partial/hemicolectomy  (scheduled June 2).  Sanda Klein, MD, [place] [phone] 11/08/2015, 1:31 PM

## 2015-11-08 NOTE — Care Management Important Message (Signed)
Important Message  Patient Details  Name: Ernest Lara MRN: [id number] Date of Birth: Dec 11, 1940   Medicare Important Message Given:  Yes    [name], [place] 11/08/2015, 9:20 AM

## 2015-11-08 NOTE — Progress Notes (Signed)
3 Days Post-Op  Subjective: Lots of loose stool last night/overnight. Just wants to get things done./moving  Objective: Vital signs in last 24 hours: Temp:  [98.3 F (36.8 C)-98.7 F (37.1 C)] 98.4 F (36.9 C) (05/31 1237) Pulse Rate:  [63-68] 63 (05/31 1237) Resp:  [18-20] 20 (05/31 1237) BP: (139-155)/(61-65) 139/64 mmHg (05/31 1237) SpO2:  [97 %-100 %] 97 % (05/31 1237) Weight:  [116.983 kg (257 lb 14.4 oz)] 116.983 kg (257 lb 14.4 oz) (05/31 0454) Last BM Date: 11/07/15  Intake/Output from previous day: 05/30 0701 - 05/31 0700 In: 1800 [P.O.:1800] Out: 325 [Urine:325] Intake/Output this shift: Total I/O In: 960 [P.O.:960] Out: -   Alert, nad Soft, obese  Lab Results:   Recent Labs  11/07/15 0340 11/08/15 0445  WBC 7.6 5.9  HGB 8.7* 8.5*  HCT 28.3* 28.1*  PLT 206 219   BMET  Recent Labs  11/07/15 0340 11/08/15 0445  NA 136 135  K 4.2 4.3  CL 107 106  CO2 23 23  GLUCOSE 142* 114*  BUN 14 11  CREATININE 1.76* 1.82*  CALCIUM 9.7 10.0   PT/INR No results for input(s): LABPROT, INR in the last 72 hours. ABG No results for input(s): PHART, HCO3 in the last 72 hours.  Invalid input(s): PCO2, PO2  Studies/Results: Ct Chest W Contrast  11/08/2015  CLINICAL DATA:  History of colon lesion, cardiomyopathy, chronic kidney disease, diabetes and hypertension EXAM: CT CHEST, ABDOMEN, AND PELVIS WITH CONTRAST TECHNIQUE: Multidetector CT imaging of the chest, abdomen and pelvis was performed following the standard protocol during bolus administration of intravenous contrast. CONTRAST:  126mL ISOVUE-300 IOPAMIDOL (ISOVUE-300) INJECTION 61% COMPARISON:  Chest x-ray of 11/03/2015 FINDINGS: CT CHEST On lung window images, no evidence of lung metastasis is seen. There are small bilateral pleural effusions present. In addition there are foci of higher attenuation within both lower lobes posteriorly most suspicious for prior aspiration. There are somewhat prominent  interstitial markings to the pleura as well which could represent mild changes of interstitial edema. There are degenerative changes throughout the thoracic spine, in the shoulders, and in the sternoclavicular joints. On lung window images, the thyroid gland contains a few small low-attenuation nodules of less than 1 cm in size, of doubtful clinical significance in this age patient. The thoracic aorta opacifies with no significant abnormality noted. The pulmonary arteries also are faintly opacify with no central abnormality evident. The heart is mildly enlarged and there is a small pericardial effusion present. Pacer wires are noted. CT ABDOMEN AND PELVIS The liver enhances with no focal abnormality and no ductal dilatation is seen. No calcified gallstones are noted. The pancreas is normal in size in the pancreatic duct is not dilated. There may be a small lipoma within the third portion of the duodenum. The adrenal glands and the spleen are unremarkable. The stomach is decompressed. There is a small left lower pole renal calculus of approximately 5 mm. No hydronephrosis is seen. On delayed images the pelvocaliceal systems are unremarkable. Small low-attenuation structures are noted bilaterally involving both kidneys most consistent with small cysts but difficult to characterize on this study. Ultrasound may be helpful if further assessment is warranted clinically. The proximal ureters are normal in caliber. The abdominal aorta is normal in caliber with moderate atherosclerotic change. No adenopathy is seen. The urinary bladder is not optimally distended but no abnormality is seen. The urinary bladder is slightly thick walled, most likely due to a degree of bladder outlet obstruction caused by a  significant enlarged prostate measuring 6.5 x 6.2 cm. The colon is largely decompressed and no obvious colonic mass is seen. Areas of contraction i.e. in the distal transverse colon near the splenic flexure of the could  conceivably represent a colonic lesion, but this area is poorly distended. It is noted that within the ascending colon near the hepatic flexure of colon there is and apparent lipoma of 17 mm in diameter with attenuation of -94 age U. scattered diverticula present within the rectosigmoid colon. 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 Also there is significant degenerative change involving the facet joints of the entire lumbar spine. IMPRESSION: 1. Cardiomegaly, small pericardial effusion, small bilateral pleural effusions, and somewhat prominent interstitial markings suggest mild interstitial edema. 2. No evidence of metastatic involvement of the chest, abdomen, or pelvis. 3. The colon is largely decompressed. No obvious mass is seen other than an apparent lipoma within the ascending colon of 17 mm in diameter. 4. There is an area of poor distension of the distal transverse colon near the splenic flexure and a colonic lesion cannot be excluded in that region. 5. 5 mm left lower pole renal calculus without obstruction. Electronically Signed   By: Ivar Drape M.D.   On: 11/08/2015 08:20   Ct Abdomen Pelvis W Contrast  11/08/2015  CLINICAL DATA:  History of colon lesion, cardiomyopathy, chronic kidney disease, diabetes and hypertension EXAM: CT CHEST, ABDOMEN, AND PELVIS WITH CONTRAST TECHNIQUE: Multidetector CT imaging of the chest, abdomen and pelvis was performed following the standard protocol during bolus administration of intravenous contrast. CONTRAST:  175mL ISOVUE-300 IOPAMIDOL (ISOVUE-300) INJECTION 61% COMPARISON:  Chest x-ray of 11/03/2015 FINDINGS: CT CHEST On lung window images, no evidence of lung metastasis is seen. There are small bilateral pleural effusions present. In addition there are foci of higher attenuation within both lower lobes posteriorly most suspicious for prior aspiration. There are somewhat prominent interstitial  markings to the pleura as well which could represent mild changes of interstitial edema. There are degenerative changes throughout the thoracic spine, in the shoulders, and in the sternoclavicular joints. On lung window images, the thyroid gland contains a few small low-attenuation nodules of less than 1 cm in size, of doubtful clinical significance in this age patient. The thoracic aorta opacifies with no significant abnormality noted. The pulmonary arteries also are faintly opacify with no central abnormality evident. The heart is mildly enlarged and there is a small pericardial effusion present. Pacer wires are noted. CT ABDOMEN AND PELVIS The liver enhances with no focal abnormality and no ductal dilatation is seen. No calcified gallstones are noted. The pancreas is normal in size in the pancreatic duct is not dilated. There may be a small lipoma within the third portion of the duodenum. The adrenal glands and the spleen are unremarkable. The stomach is decompressed. There is a small left lower pole renal calculus of approximately 5 mm. No hydronephrosis is seen. On delayed images the pelvocaliceal systems are unremarkable. Small low-attenuation structures are noted bilaterally involving both kidneys most consistent with small cysts but difficult to characterize on this study. Ultrasound may be helpful if further assessment is warranted clinically. The proximal ureters are normal in caliber. The abdominal aorta is normal in caliber with moderate atherosclerotic change. No adenopathy is seen. The urinary bladder is not optimally distended but no abnormality is seen. The urinary bladder is slightly thick walled, most likely due to a degree of bladder outlet obstruction caused by  a significant enlarged prostate measuring 6.5 x 6.2 cm. The colon is largely decompressed and no obvious colonic mass is seen. Areas of contraction i.e. in the distal transverse colon near the splenic flexure of the could conceivably  represent a colonic lesion, but this area is poorly distended. It is noted that within the ascending colon near the hepatic flexure of colon there is and apparent lipoma of 17 mm in diameter with attenuation of -90 age U. scattered diverticula present within the rectosigmoid colon. 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 Also there is significant degenerative change involving the facet joints of the entire lumbar spine. IMPRESSION: 1. Cardiomegaly, small pericardial effusion, small bilateral pleural effusions, and somewhat prominent interstitial markings suggest mild interstitial edema. 2. No evidence of metastatic involvement of the chest, abdomen, or pelvis. 3. The colon is largely decompressed. No obvious mass is seen other than an apparent lipoma within the ascending colon of 17 mm in diameter. 4. There is an area of poor distension of the distal transverse colon near the splenic flexure and a colonic lesion cannot be excluded in that region. 5. 5 mm left lower pole renal calculus without obstruction. Electronically Signed   By: Ivar Drape M.D.   On: 11/08/2015 08:20    Anti-infectives: Anti-infectives    None      Assessment/Plan: Transverse colon cancer Large benignRecto-sigmoid polyp Multiple colonic polyps s/p polypectomy ABLA Duodenal ulcer PAF, Xarelto on hold NICMP DM II HTN Complete heart block s/p pacemaker Acute on chronic kidney disease  If has ongoing loose stool in AM - will check C diff in am  No signs Of metastatic disease on CT scan. Does have significantly enlarged prostate consistent with BPH. He does have some BPH symptoms. I explained this would raise his risk for postoperative urinary retention.   Nutrition indices acceptable for proceeding with colon resection this admission  We will plan: Surgery on Friday early afternoon. I explained my rationale for waiting to Friday. This is the  earliest I have appropriate surgical assist. I explained my rationale for waiting for availability of a surgical assistant. In the interim we'll keep him on clear liquids +3 times a day protein shakes.  Even though he has pan colonic polyps, given his age and comorbidities I recommended just focusing primarily on the known cancer and ideally the unresectable rectosigmoid benign polyp. We'll plan on laparoscopic left colectomy on Friday.  I discussed the procedure in detail.   We discussed the risks and benefits of surgery including, but not limited to bleeding, infection (such as wound infection, abdominal abscess), injury to surrounding structures, blood clot formation, urinary retention, incisional hernia, anastomotic stricture, anastomotic leak, anesthesia risks, pulmonary & cardiac complications such as pneumonia &/or heart attack, need for additional procedures, ileus, & prolonged hospitalization.  We discussed the typical postoperative recovery course, including limitations & restrictions postoperatively. I explained that the likelihood of improvement in their symptoms is good.  Will "preop" on thursday  Ernest Lara. Redmond Pulling, MD, FACS General, Bariatric, & Minimally Invasive Surgery [facility], [place]    LOS: 5 days    Ernest Lara 11/08/2015

## 2015-11-09 ENCOUNTER — Encounter: Payer: Self-pay | Admitting: *Deleted

## 2015-11-09 DIAGNOSIS — K2951 Unspecified chronic gastritis with bleeding: Secondary | ICD-10-CM

## 2015-11-09 LAB — CBC
HCT: 27.5 % — ABNORMAL LOW (ref 39.0–52.0)
HEMOGLOBIN: 8.4 g/dL — AB (ref 13.0–17.0)
MCH: 22 pg — AB (ref 26.0–34.0)
MCHC: 30.5 g/dL (ref 30.0–36.0)
MCV: 72.2 fL — ABNORMAL LOW (ref 78.0–100.0)
PLATELETS: 209 10*3/uL (ref 150–400)
RBC: 3.81 MIL/uL — AB (ref 4.22–5.81)
RDW: 23.1 % — ABNORMAL HIGH (ref 11.5–15.5)
WBC: 5 10*3/uL (ref 4.0–10.5)

## 2015-11-09 LAB — COMPREHENSIVE METABOLIC PANEL
ALBUMIN: 2.6 g/dL — AB (ref 3.5–5.0)
ALK PHOS: 52 U/L (ref 38–126)
ALT: 16 U/L — AB (ref 17–63)
AST: 17 U/L (ref 15–41)
Anion gap: 7 (ref 5–15)
BUN: 10 mg/dL (ref 6–20)
CALCIUM: 9.7 mg/dL (ref 8.9–10.3)
CHLORIDE: 108 mmol/L (ref 101–111)
CO2: 22 mmol/L (ref 22–32)
CREATININE: 1.63 mg/dL — AB (ref 0.61–1.24)
GFR calc non Af Amer: 40 mL/min — ABNORMAL LOW (ref >60)
GFR, EST AFRICAN AMERICAN: 46 mL/min — AB (ref >60)
GLUCOSE: 116 mg/dL — AB (ref 65–99)
Potassium: 4 mmol/L (ref 3.5–5.1)
SODIUM: 137 mmol/L (ref 135–145)
Total Bilirubin: 1.2 mg/dL (ref 0.3–1.2)
Total Protein: 5.7 g/dL — ABNORMAL LOW (ref 6.5–8.1)

## 2015-11-09 LAB — GLUCOSE, CAPILLARY
GLUCOSE-CAPILLARY: 244 mg/dL — AB (ref 65–99)
GLUCOSE-CAPILLARY: 146 mg/dL — AB (ref 65–99)
GLUCOSE-CAPILLARY: 154 mg/dL — AB (ref 65–99)
Glucose-Capillary: 126 mg/dL — ABNORMAL HIGH (ref 65–99)
Glucose-Capillary: 174 mg/dL — ABNORMAL HIGH (ref 65–99)

## 2015-11-09 LAB — HEMOGLOBIN A1C
HEMOGLOBIN A1C: 7.3 % — AB (ref 4.8–5.6)
MEAN PLASMA GLUCOSE: 163 mg/dL

## 2015-11-09 LAB — PREPARE RBC (CROSSMATCH)

## 2015-11-09 MED ORDER — HEPARIN SODIUM (PORCINE) 5000 UNIT/ML IJ SOLN
5000.0000 [IU] | Freq: Once | INTRAMUSCULAR | Status: AC
  Administered 2015-11-09: 5000 [IU] via SUBCUTANEOUS
  Filled 2015-11-09: qty 1

## 2015-11-09 MED ORDER — DEXTROSE 5 % IV SOLN
2.0000 g | INTRAVENOUS | Status: AC
  Administered 2015-11-10: 2 g via INTRAVENOUS
  Filled 2015-11-09: qty 2

## 2015-11-09 MED ORDER — SODIUM CHLORIDE 0.9 % IV SOLN
Freq: Once | INTRAVENOUS | Status: AC
  Administered 2015-11-09: 09:00:00 via INTRAVENOUS

## 2015-11-09 MED ORDER — SODIUM CHLORIDE 0.9 % IV SOLN: INTRAVENOUS | Status: DC

## 2015-11-09 MED ORDER — SODIUM CHLORIDE 0.9 % IV SOLN
INTRAVENOUS | Status: DC
  Administered 2015-11-09 – 2015-11-10 (×2): via INTRAVENOUS

## 2015-11-09 MED ORDER — ZOLPIDEM TARTRATE 5 MG PO TABS
5.0000 mg | ORAL_TABLET | Freq: Every evening | ORAL | Status: DC | PRN
  Administered 2015-11-09: 5 mg via ORAL
  Filled 2015-11-09: qty 1

## 2015-11-09 MED ORDER — BENAZEPRIL HCL 10 MG PO TABS
40.0000 mg | ORAL_TABLET | Freq: Every day | ORAL | Status: DC
  Administered 2015-11-09 – 2015-11-12 (×3): 40 mg via ORAL
  Filled 2015-11-09: qty 1
  Filled 2015-11-09: qty 4
  Filled 2015-11-09: qty 1
  Filled 2015-11-09: qty 4

## 2015-11-09 MED ORDER — HEPARIN SODIUM (PORCINE) 5000 UNIT/ML IJ SOLN: 5000.0000 [IU] | Freq: Once | INTRAMUSCULAR | Status: DC

## 2015-11-09 MED ORDER — ALVIMOPAN 12 MG PO CAPS: 12.0000 mg | ORAL_CAPSULE | Freq: Once | ORAL | Status: DC

## 2015-11-09 MED ORDER — PANTOPRAZOLE SODIUM 40 MG IV SOLR
40.0000 mg | Freq: Two times a day (BID) | INTRAVENOUS | Status: DC
  Administered 2015-11-10 – 2015-11-13 (×6): 40 mg via INTRAVENOUS
  Filled 2015-11-09 (×7): qty 40

## 2015-11-09 MED ORDER — DEXTROSE 5 % IV SOLN: 2.0000 g | INTRAVENOUS | Status: DC

## 2015-11-09 MED ORDER — ALVIMOPAN 12 MG PO CAPS
12.0000 mg | ORAL_CAPSULE | Freq: Once | ORAL | Status: AC
  Administered 2015-11-10: 12 mg via ORAL
  Filled 2015-11-09 (×2): qty 1

## 2015-11-09 NOTE — Progress Notes (Signed)
4 Days Post-Op  Subjective: He is anxious but ready to get his surgery done.  No other real complaints.  Objective: Vital signs in last 24 hours: Temp:  [98.4 F (36.9 C)-98.6 F (37 C)] 98.6 F (37 C) (06/01 0451) Pulse Rate:  [62-81] 81 (06/01 0451) Resp:  [18-20] 18 (06/01 0451) BP: (131-145)/(64-76) 131/76 mmHg (06/01 0451) SpO2:  [97 %-100 %] 98 % (06/01 0451) Weight:  [117.754 kg (259 lb 9.6 oz)] 117.754 kg (259 lb 9.6 oz) (06/01 0451) Last BM Date: 11/08/15 1920 PO Urine 1475 Stool -1 recorded Afebrile, VSS Labs show creatinine 1.63 H/H is stable A1c 7.3 I do not see a result for C diff CT scan 11/08/15:  Cardiomegaly, small pericardial effusion, small bilateral pleural effusions, and somewhat prominent interstitial markings suggest mild interstitial edema.  No evidence of metastatic involvement of the chest, abdomen, or pelvis.  The colon is largely decompressed. No obvious mass is seen other than an apparent lipoma within the ascending colon of 17 mm in diameter. There is an area of poor distension of the distal transverse colon near the splenic flexure and a colonic lesion cannot be excluded in that region. 5. 5 mm left lower pole renal calculus without obstruction.  Intake/Output from previous day: 05/31 0701 - 06/01 0700 In: 2570 [P.O.:1920; I.V.:650] Out: 1475 [Urine:1475] Intake/Output this shift:    General appearance: alert, cooperative and no distress Resp: clear to auscultation bilaterally Cardio: regular, no murmurs heard GI: soft, non-tender; bowel sounds normal; no masses,  no organomegaly  Lab Results:   Recent Labs  11/08/15 0445 11/09/15 0250  WBC 5.9 5.0  HGB 8.5* 8.4*  HCT 28.1* 27.5*  PLT 219 209    BMET  Recent Labs  11/08/15 0445 11/09/15 0250  NA 135 137  K 4.3 4.0  CL 106 108  CO2 23 22  GLUCOSE 114* 116*  BUN 11 10  CREATININE 1.82* 1.63*  CALCIUM 10.0 9.7   PT/INR No results for input(s): LABPROT, INR in the last 72  hours.   Recent Labs Lab 11/05/15 0452 11/06/15 0347 11/07/15 0340 11/08/15 0445 11/09/15 0250  AST 18 17 17 20 17   ALT 14* 13* 13* 13* 16*  ALKPHOS 60 55 57 53 52  BILITOT 1.5* 1.4* 1.6* 1.7* 1.2  PROT 6.3* 5.9* 6.1* 6.2* 5.7*  ALBUMIN 3.1* 2.8* 2.8* 2.8* 2.6*     Lipase  No results found for: LIPASE   Studies/Results: Ct Chest W Contrast  11/08/2015  CLINICAL DATA:  History of colon lesion, cardiomyopathy, chronic kidney disease, diabetes and hypertension EXAM: CT CHEST, ABDOMEN, AND PELVIS WITH CONTRAST TECHNIQUE: Multidetector CT imaging of the chest, abdomen and pelvis was performed following the standard protocol during bolus administration of intravenous contrast. CONTRAST:  148mL ISOVUE-300 IOPAMIDOL (ISOVUE-300) INJECTION 61% COMPARISON:  Chest x-ray of 11/03/2015 FINDINGS: CT CHEST On lung window images, no evidence of lung metastasis is seen. There are small bilateral pleural effusions present. In addition there are foci of higher attenuation within both lower lobes posteriorly most suspicious for prior aspiration. There are somewhat prominent interstitial markings to the pleura as well which could represent mild changes of interstitial edema. There are degenerative changes throughout the thoracic spine, in the shoulders, and in the sternoclavicular joints. On lung window images, the thyroid gland contains a few small low-attenuation nodules of less than 1 cm in size, of doubtful clinical significance in this age patient. The thoracic aorta opacifies with no significant abnormality noted. The pulmonary arteries  also are faintly opacify with no central abnormality evident. The heart is mildly enlarged and there is a small pericardial effusion present. Pacer wires are noted. CT ABDOMEN AND PELVIS The liver enhances with no focal abnormality and no ductal dilatation is seen. No calcified gallstones are noted. The pancreas is normal in size in the pancreatic duct is not dilated. There  may be a small lipoma within the third portion of the duodenum. The adrenal glands and the spleen are unremarkable. The stomach is decompressed. There is a small left lower pole renal calculus of approximately 5 mm. No hydronephrosis is seen. On delayed images the pelvocaliceal systems are unremarkable. Small low-attenuation structures are noted bilaterally involving both kidneys most consistent with small cysts but difficult to characterize on this study. Ultrasound may be helpful if further assessment is warranted clinically. The proximal ureters are normal in caliber. The abdominal aorta is normal in caliber with moderate atherosclerotic change. No adenopathy is seen. The urinary bladder is not optimally distended but no abnormality is seen. The urinary bladder is slightly thick walled, most likely due to a degree of bladder outlet obstruction caused by a significant enlarged prostate measuring 6.5 x 6.2 cm. The colon is largely decompressed and no obvious colonic mass is seen. Areas of contraction i.e. in the distal transverse colon near the splenic flexure of the could conceivably represent a colonic lesion, but this area is poorly distended. It is noted that within the ascending colon near the hepatic flexure of colon there is and apparent lipoma of 17 mm in diameter with attenuation of -54 age U. scattered diverticula present within the rectosigmoid colon. 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 Also there is significant degenerative change involving the facet joints of the entire lumbar spine. IMPRESSION: 1. Cardiomegaly, small pericardial effusion, small bilateral pleural effusions, and somewhat prominent interstitial markings suggest mild interstitial edema. 2. No evidence of metastatic involvement of the chest, abdomen, or pelvis. 3. The colon is largely decompressed. No obvious mass is seen other than an apparent lipoma within the  ascending colon of 17 mm in diameter. 4. There is an area of poor distension of the distal transverse colon near the splenic flexure and a colonic lesion cannot be excluded in that region. 5. 5 mm left lower pole renal calculus without obstruction. Electronically Signed   By: Ivar Drape M.D.   On: 11/08/2015 08:20   Ct Abdomen Pelvis W Contrast  11/08/2015  CLINICAL DATA:  History of colon lesion, cardiomyopathy, chronic kidney disease, diabetes and hypertension EXAM: CT CHEST, ABDOMEN, AND PELVIS WITH CONTRAST TECHNIQUE: Multidetector CT imaging of the chest, abdomen and pelvis was performed following the standard protocol during bolus administration of intravenous contrast. CONTRAST:  188mL ISOVUE-300 IOPAMIDOL (ISOVUE-300) INJECTION 61% COMPARISON:  Chest x-ray of 11/03/2015 FINDINGS: CT CHEST On lung window images, no evidence of lung metastasis is seen. There are small bilateral pleural effusions present. In addition there are foci of higher attenuation within both lower lobes posteriorly most suspicious for prior aspiration. There are somewhat prominent interstitial markings to the pleura as well which could represent mild changes of interstitial edema. There are degenerative changes throughout the thoracic spine, in the shoulders, and in the sternoclavicular joints. On lung window images, the thyroid gland contains a few small low-attenuation nodules of less than 1 cm in size, of doubtful clinical significance in this age patient. The thoracic aorta opacifies with no significant abnormality noted. The pulmonary  arteries also are faintly opacify with no central abnormality evident. The heart is mildly enlarged and there is a small pericardial effusion present. Pacer wires are noted. CT ABDOMEN AND PELVIS The liver enhances with no focal abnormality and no ductal dilatation is seen. No calcified gallstones are noted. The pancreas is normal in size in the pancreatic duct is not dilated. There may be a small  lipoma within the third portion of the duodenum. The adrenal glands and the spleen are unremarkable. The stomach is decompressed. There is a small left lower pole renal calculus of approximately 5 mm. No hydronephrosis is seen. On delayed images the pelvocaliceal systems are unremarkable. Small low-attenuation structures are noted bilaterally involving both kidneys most consistent with small cysts but difficult to characterize on this study. Ultrasound may be helpful if further assessment is warranted clinically. The proximal ureters are normal in caliber. The abdominal aorta is normal in caliber with moderate atherosclerotic change. No adenopathy is seen. The urinary bladder is not optimally distended but no abnormality is seen. The urinary bladder is slightly thick walled, most likely due to a degree of bladder outlet obstruction caused by a significant enlarged prostate measuring 6.5 x 6.2 cm. The colon is largely decompressed and no obvious colonic mass is seen. Areas of contraction i.e. in the distal transverse colon near the splenic flexure of the could conceivably represent a colonic lesion, but this area is poorly distended. It is noted that within the ascending colon near the hepatic flexure of colon there is and apparent lipoma of 17 mm in diameter with attenuation of -15 age U. scattered diverticula present within the rectosigmoid colon. 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 Also there is significant degenerative change involving the facet joints of the entire lumbar spine. IMPRESSION: 1. Cardiomegaly, small pericardial effusion, small bilateral pleural effusions, and somewhat prominent interstitial markings suggest mild interstitial edema. 2. No evidence of metastatic involvement of the chest, abdomen, or pelvis. 3. The colon is largely decompressed. No obvious mass is seen other than an apparent lipoma within the ascending colon  of 17 mm in diameter. 4. There is an area of poor distension of the distal transverse colon near the splenic flexure and a colonic lesion cannot be excluded in that region. 5. 5 mm left lower pole renal calculus without obstruction. Electronically Signed   By: Ivar Drape M.D.   On: 11/08/2015 08:20    Medications: . benazepril  40 mg Oral Daily  . carvedilol  12.5 mg Oral q morning - 10a  . doxazosin  8 mg Oral QHS  . feeding supplement  1 Container Oral TID BM  . insulin aspart  0-9 Units Subcutaneous Q4H  . pantoprazole  40 mg Oral BID AC  . rosuvastatin  20 mg Oral q1800  . sucralfate  1 g Oral QID   . sodium chloride 50 mL/hr at 11/08/15 1527   Assessment/Plan Transverse colon caner,with lagre benign rectosigmoid polyp Multiple colonic polyps Hx of duodenal ulcer/GI bleed Anemia/chronic blood loss PAF, chronic anticoagulation, Xarelto pre admit Hx of Complete heart block with pacer AODM Hypertension Acute on chronic kidney disease Dyslipidemia  BPH FEN:  Clear liquids/IV fluids ID:  Currently none Cefotetan pre op DVT:  Currently SCD's only   Plan:  Left colectomy tomorrow. I do not see a C diff, no stools so far today and 3 stools he remembers from yesterday.  Type and screen today, Entereg pre op, heparin  pre op, NPO,  Heparin preop, Cefotetan pre op. Fluid up to 75 per hour pre op starting at 2200 hours this PM.    LOS: 6 days    Darnita Woodrum 11/09/2015 [id number]

## 2015-11-09 NOTE — Progress Notes (Signed)
Patient Name: Ernest Lara Date of Encounter: 11/09/2015     Principal Problem:   GI bleed Active Problems:   DM (diabetes mellitus), type 2    HTN (hypertension)   PAF (paroxysmal atrial fibrillation), recent DCCV 10/11/11 to SR.   Hyperlipidemia   Complete heart block (HCC)   Acute blood loss anemia   Acute kidney injury superimposed on chronic kidney disease (HCC)   Colon tumor   Iron deficiency anemia   Chronic blood loss anemia    SUBJECTIVE No complaints. Bummed he will be in the hospital for such a long time. Surgery tomorrow.   CURRENT MEDS . carvedilol  12.5 mg Oral q morning - 10a  . doxazosin  8 mg Oral QHS  . feeding supplement  1 Container Oral TID BM  . insulin aspart  0-9 Units Subcutaneous Q4H  . pantoprazole  40 mg Oral BID AC  . rosuvastatin  20 mg Oral q1800  . sucralfate  1 g Oral QID    OBJECTIVE  Filed Vitals:   11/08/15 0454 11/08/15 1237 11/08/15 2012 11/09/15 0451  BP: 155/61 139/64 145/71 131/76  Pulse: 68 63 62 81  Temp: 98.7 F (37.1 C) 98.4 F (36.9 C) 98.4 F (36.9 C) 98.6 F (37 C)  TempSrc: Oral Oral Oral Oral  Resp: 18 20 18 18   Height:      Weight: 257 lb 14.4 oz (116.983 kg)   259 lb 9.6 oz (117.754 kg)  SpO2: 97% 97% 100% 98%    Intake/Output Summary (Last 24 hours) at 11/09/15 0711 Last data filed at 11/09/15 [id number]  Gross per 24 hour  Intake   2570 ml  Output   1475 ml  Net   1095 ml   Filed Weights   11/07/15 0444 11/08/15 0454 11/09/15 0451  Weight: 260 lb 12.8 oz (118.298 kg) 257 lb 14.4 oz (116.983 kg) 259 lb 9.6 oz (117.754 kg)    PHYSICAL EXAM  General: Pleasant, NAD. Neuro: Alert and oriented X 3. Moves all extremities spontaneously. Psych: Normal affect. HEENT:  Normal  Neck: Supple without bruits or JVD. Lungs:  Resp regular and unlabored, CTA. Heart: RRR no s3, s4, or murmurs. Abdomen: Soft, non-tender, non-distended, BS + x 4.  Extremities: No clubbing, cyanosis or edema. DP/PT/Radials 2+ and  equal bilaterally.  Accessory Clinical Findings  CBC  Recent Labs  11/08/15 0445 11/09/15 0250  WBC 5.9 5.0  HGB 8.5* 8.4*  HCT 28.1* 27.5*  MCV 72.1* 72.2*  PLT 219 [id number]   Basic Metabolic Panel  Recent Labs  11/08/15 0445 11/09/15 0250  NA 135 137  K 4.3 4.0  CL 106 108  CO2 23 22  GLUCOSE 114* 116*  BUN 11 10  CREATININE 1.82* 1.63*  CALCIUM 10.0 9.7   Liver Function Tests  Recent Labs  11/08/15 0445 11/09/15 0250  AST 20 17  ALT 13* 16*  ALKPHOS 53 52  BILITOT 1.7* 1.2  PROT 6.2* 5.7*  ALBUMIN 2.8* 2.6*    TELE  V paced  Radiology/Studies  Dg Chest 2 View  11/03/2015  CLINICAL DATA:  Shortness of breath. EXAM: CHEST  2 VIEW COMPARISON:  May 03, 2015. FINDINGS: The heart size and mediastinal contours are within normal limits. Both lungs are clear. No pneumothorax or pleural effusion is noted. Left-sided pacemaker is unchanged in position. The visualized skeletal structures are unremarkable. IMPRESSION: No active cardiopulmonary disease. Electronically Signed   By: Marijo Conception, M.D.   On: 11/03/2015  19:30   Ct Chest W Contrast  11/08/2015  CLINICAL DATA:  History of colon lesion, cardiomyopathy, chronic kidney disease, diabetes and hypertension EXAM: CT CHEST, ABDOMEN, AND PELVIS WITH CONTRAST TECHNIQUE: Multidetector CT imaging of the chest, abdomen and pelvis was performed following the standard protocol during bolus administration of intravenous contrast. CONTRAST:  145mL ISOVUE-300 IOPAMIDOL (ISOVUE-300) INJECTION 61% COMPARISON:  Chest x-ray of 11/03/2015 FINDINGS: CT CHEST On lung window images, no evidence of lung metastasis is seen. There are small bilateral pleural effusions present. In addition there are foci of higher attenuation within both lower lobes posteriorly most suspicious for prior aspiration. There are somewhat prominent interstitial markings to the pleura as well which could represent mild changes of interstitial edema. There are  degenerative changes throughout the thoracic spine, in the shoulders, and in the sternoclavicular joints. On lung window images, the thyroid gland contains a few small low-attenuation nodules of less than 1 cm in size, of doubtful clinical significance in this age patient. The thoracic aorta opacifies with no significant abnormality noted. The pulmonary arteries also are faintly opacify with no central abnormality evident. The heart is mildly enlarged and there is a small pericardial effusion present. Pacer wires are noted. CT ABDOMEN AND PELVIS The liver enhances with no focal abnormality and no ductal dilatation is seen. No calcified gallstones are noted. The pancreas is normal in size in the pancreatic duct is not dilated. There may be a small lipoma within the third portion of the duodenum. The adrenal glands and the spleen are unremarkable. The stomach is decompressed. There is a small left lower pole renal calculus of approximately 5 mm. No hydronephrosis is seen. On delayed images the pelvocaliceal systems are unremarkable. Small low-attenuation structures are noted bilaterally involving both kidneys most consistent with small cysts but difficult to characterize on this study. Ultrasound may be helpful if further assessment is warranted clinically. The proximal ureters are normal in caliber. The abdominal aorta is normal in caliber with moderate atherosclerotic change. No adenopathy is seen. The urinary bladder is not optimally distended but no abnormality is seen. The urinary bladder is slightly thick walled, most likely due to a degree of bladder outlet obstruction caused by a significant enlarged prostate measuring 6.5 x 6.2 cm. The colon is largely decompressed and no obvious colonic mass is seen. Areas of contraction i.e. in the distal transverse colon near the splenic flexure of the could conceivably represent a colonic lesion, but this area is poorly distended. It is noted that within the ascending  colon near the hepatic flexure of colon there is and apparent lipoma of 17 mm in diameter with attenuation of -32 age U. scattered diverticula present within the rectosigmoid colon. 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 Also there is significant degenerative change involving the facet joints of the entire lumbar spine. IMPRESSION: 1. Cardiomegaly, small pericardial effusion, small bilateral pleural effusions, and somewhat prominent interstitial markings suggest mild interstitial edema. 2. No evidence of metastatic involvement of the chest, abdomen, or pelvis. 3. The colon is largely decompressed. No obvious mass is seen other than an apparent lipoma within the ascending colon of 17 mm in diameter. 4. There is an area of poor distension of the distal transverse colon near the splenic flexure and a colonic lesion cannot be excluded in that region. 5. 5 mm left lower pole renal calculus without obstruction. Electronically Signed   By: Ivar Drape M.D.   On: 11/08/2015  08:20   Ct Abdomen Pelvis W Contrast  11/08/2015  CLINICAL DATA:  History of colon lesion, cardiomyopathy, chronic kidney disease, diabetes and hypertension EXAM: CT CHEST, ABDOMEN, AND PELVIS WITH CONTRAST TECHNIQUE: Multidetector CT imaging of the chest, abdomen and pelvis was performed following the standard protocol during bolus administration of intravenous contrast. CONTRAST:  110mL ISOVUE-300 IOPAMIDOL (ISOVUE-300) INJECTION 61% COMPARISON:  Chest x-ray of 11/03/2015 FINDINGS: CT CHEST On lung window images, no evidence of lung metastasis is seen. There are small bilateral pleural effusions present. In addition there are foci of higher attenuation within both lower lobes posteriorly most suspicious for prior aspiration. There are somewhat prominent interstitial markings to the pleura as well which could represent mild changes of interstitial edema. There are degenerative  changes throughout the thoracic spine, in the shoulders, and in the sternoclavicular joints. On lung window images, the thyroid gland contains a few small low-attenuation nodules of less than 1 cm in size, of doubtful clinical significance in this age patient. The thoracic aorta opacifies with no significant abnormality noted. The pulmonary arteries also are faintly opacify with no central abnormality evident. The heart is mildly enlarged and there is a small pericardial effusion present. Pacer wires are noted. CT ABDOMEN AND PELVIS The liver enhances with no focal abnormality and no ductal dilatation is seen. No calcified gallstones are noted. The pancreas is normal in size in the pancreatic duct is not dilated. There may be a small lipoma within the third portion of the duodenum. The adrenal glands and the spleen are unremarkable. The stomach is decompressed. There is a small left lower pole renal calculus of approximately 5 mm. No hydronephrosis is seen. On delayed images the pelvocaliceal systems are unremarkable. Small low-attenuation structures are noted bilaterally involving both kidneys most consistent with small cysts but difficult to characterize on this study. Ultrasound may be helpful if further assessment is warranted clinically. The proximal ureters are normal in caliber. The abdominal aorta is normal in caliber with moderate atherosclerotic change. No adenopathy is seen. The urinary bladder is not optimally distended but no abnormality is seen. The urinary bladder is slightly thick walled, most likely due to a degree of bladder outlet obstruction caused by a significant enlarged prostate measuring 6.5 x 6.2 cm. The colon is largely decompressed and no obvious colonic mass is seen. Areas of contraction i.e. in the distal transverse colon near the splenic flexure of the could conceivably represent a colonic lesion, but this area is poorly distended. It is noted that within the ascending colon near the  hepatic flexure of colon there is and apparent lipoma of 17 mm in diameter with attenuation of -86 age U. scattered diverticula present within the rectosigmoid colon. 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 Also there is significant degenerative change involving the facet joints of the entire lumbar spine. IMPRESSION: 1. Cardiomegaly, small pericardial effusion, small bilateral pleural effusions, and somewhat prominent interstitial markings suggest mild interstitial edema. 2. No evidence of metastatic involvement of the chest, abdomen, or pelvis. 3. The colon is largely decompressed. No obvious mass is seen other than an apparent lipoma within the ascending colon of 17 mm in diameter. 4. There is an area of poor distension of the distal transverse colon near the splenic flexure and a colonic lesion cannot be excluded in that region. 5. 5 mm left lower pole renal calculus without obstruction. Electronically Signed   By: Ivar Drape M.D.   On:  11/08/2015 08:20   Dg Abd Portable 1v  11/05/2015  CLINICAL DATA:  75 year old male with history of colonic neoplasm. EXAM: PORTABLE ABDOMEN - 1 VIEW COMPARISON:  No priors. FINDINGS: Several nondilated gas-filled loops of small bowel are noted throughout the central abdomen. No pathologic dilatation of small bowel or colon. No gross evidence of pneumoperitoneum on these supine images. Clips are seen projecting over the left side of the epigastric region and overlying the right upper quadrant. IMPRESSION: 1. Nonspecific, nonobstructive bowel gas pattern. 2. No pneumoperitoneum. Electronically Signed   By: Vinnie Langton M.D.   On: 11/05/2015 17:34    ASSESSMENT AND PLAN  75 yo male w/ hx PAF on Xarelto (CHADS2VASC=3), HTN, DM, CHB s/p BSci CRT-P, CKD III, remote NICM w/ EF now nl (echo 04/2015) who was admitted on 05/26 w/ GIB, Hgb 4.6 and found to have a malignant colon mass.  GI bleed/Colon  cancer: he underwent a colonoscopy by Dr. [name] on 5/28 and was found to have a transverse colon mass which was biopsied, multiple polyps which were removed and, a "26mm" polyp in the recto-sigmoid which was biopsied. 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 CT abdomen with no evidence of metastatic spread. Surgery planning for hemicolectomy. Cleared from a cardiology perspective for surgery (scheduled June 2)  HTN (hypertension) - good control on current rx. Carvedilol should not be stopped periop to avoid rebound arrhythmia/HTN/CHF. May need IV metoprolol if intestinal transit fails to resume promptly after surgery.  PAF: with recent DCCV 10/11/11 to SR. No new episodes of Afib on recent device interrogation.  - Xarelto stopped due to anemia and presumed GI bleed from ulcer and polyps. Will stay off until cleared to resume after surgery.  - continue coreg  HLD: continue statin  Complete heart block - s/p [employer] CRT-P device. Normal pacemaker function, he is not pacemaker dependent (had normal AV conduction at time of device check), but has committed BiV pacing 100%. No recent atrial fibrillation.   Acute kidney injury superimposed on chronic kidney disease ([place]) - initial BUN/CR 44/2.92. ACE held and creat around baseline. Creat improved to 1.63. Will restart Lotensin 40  Signed, Angelena Form PA-C  Pager [phone]  I have seen and examined the patient along with Angelena Form PA-C.  I have reviewed the chart, notes and new data.  I agree with PA's note.  Key new complaints: no CV complaints Key examination changes: no overt hypervolemia or bleedinh Key new findings / data: Hgb 8.4 relatively unchanged since transfusion  PLAN: Will follow perioperatively. No new recommendations.  Sanda Klein, MD, [place] [phone] 11/09/2015, 12:59 PM

## 2015-11-09 NOTE — Progress Notes (Signed)
RN walked into pt's room fixing to hang blood while Dr. Redmond Pulling was at the bedside.  Dr. Redmond Pulling ordered to not give blood at this time.  Blood walked back down to blood bank and order cancelled.  Will continue to monitor.

## 2015-11-09 NOTE — Progress Notes (Signed)
[facility] PROGRESS NOTE  Ernest Lara [id number] DOB: 12/18/1940 DOA: 11/03/2015   PCP: [name], MD     Assessment/Plan: Principal Problem:   GI bleed Active Problems:   DM (diabetes mellitus), type 2    HTN (hypertension)   PAF (paroxysmal atrial fibrillation), recent DCCV 10/11/11 to SR.   Hyperlipidemia   Complete heart block (HCC)   Acute blood loss anemia   Acute kidney injury superimposed on chronic kidney disease (HCC)   Colon tumor   Iron deficiency anemia   Chronic blood loss anemia   75 y.o. male with medical history significant of HTN, HLD, PAF on xarelto, Systolic CHF EF [id number] in 04/2015, CKD stage III, diabetes mellitus type 2, s/p PM; who presents with approximately 3 weeks of progressively worsening weakness. Upon admission patient was seen to be afebrile with blood pressure initially noted to be as low as 99/53. All other vital signs within normal limits. Lab work revealed a hemoglobin of 4.6. Admitted for workup of his anemia and weakness. 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  Continue to hold Xarelto, this may not be continued due to his renal function, he may need an alternative anticoagulant prior to discharge , INR supratherapeutic on admission, reversed with FFP General surgery consulted  for further evaluation of possible malignancy in the transverse colon-recommend left colectomy 6/2 Status post transfusion of 7 units of packed red blood cells, repeat CBC 9.1> 8.7 Continue Protonix  ,From a cardiac perspective he should be able to proceed with surgery without further cardiac specific testing per cardiology. Continue Coreg perioperatively CEA normal, CT scan chest abdomen and pelvis does not show any  evidence of metastatic disease Would   Request transfer to surgery service as cardiology  already on board and managing patient's cardiac issues,[name], MD would continue following the patient  ultimately need another colonoscopy, post-operatively, in about one year with Dr. [name]    Acute blood loss anemia: Patient Presents with weakness. Gives history of every other day Aleve/on Xarelto  .baseline hemoglobin around 10.5-11.   Status post 6 units of packed red blood cells and 2 units of FFP  Hemoglobin around 8.5, with transfuse 1 more unit in anticipation of surgery tomorrow   Iron deficiency Start Ferrous sulfate supplementation when able   Paroxysmal Atrial fibrillation/ flutter on chronic anticoagulation: Patient currently in sinus rhythm heart rates controlled less than 100. CHADS2VASC=3 - Held Xarelto , this may need to be discontinued given GFR of 23 - Continue Coreg    Acute kidney injury on chronic kidney disease stage III: Baseline creatinine noted to be somewhere around 1.4-1.9 upon review of previous admissions. However, creatinine acutely elevated to 2.92 with a BUN of 44 on admission. 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  Hyperlipidemia - Continue  Crestor   BPH - Continue doxazosin  Insomnia - Benadryl prn   DVT prophylaxsis SCDs  Code Status:  Full code     Family Communication: Discussed in detail with the patient/daughter, all imaging results, lab results explained to the patient   Disposition Plan:  Anticipate surgery tomorrow    Consultants:  Gastroenterology  Cardiology  Gen. surgery  Procedures:  None  Antibiotics: Anti-infectives    None         HPI/Subjective: Diarrhea resolved after discontinuation of MiraLAX   Objective: Filed Vitals:   11/08/15 1237 11/08/15 2012 11/09/15 0451 11/09/15 0752  BP: 139/64 145/71 131/76 147/54  Pulse: 63 62 81 59  Temp: 98.4 F (36.9 C) 98.4 F (36.9 C) 98.6 F (37 C) 98 F (36.7 C)  TempSrc: Oral Oral Oral   Resp: 20 18 18 18   Height:      Weight:   117.754 kg (259 lb 9.6 oz)   SpO2: 97% 100% 98% 97%    Intake/Output Summary (Last 24 hours) at 11/09/15 1049 Last data filed at 11/09/15 0933  Gross per 24 hour  Intake   2330 ml  Output   1750 ml  Net    580 ml     General exam: Appears calm and comfortable  Respiratory system: Clear to auscultation. Respiratory effort normal. Cardiovascular system: S1 & S2 heard, RRR. No JVD, murmurs, rubs, gallops or clicks. No pedal edema. Gastrointestinal system: Abdomen is nondistended, soft and nontender. No organomegaly or masses felt. Normal bowel sounds heard. Central nervous system: Alert and oriented. No focal neurological deficits. Extremities: Symmetric 5 x 5 power. Skin: No rashes, lesions or ulcers Psychiatry: Judgement and insight appear normal. Mood & affect appropriate.     Data Reviewed: I have personally reviewed following labs and imaging studies  Micro Results No results found for this or any previous visit (from the past 240 hour(s)).  Radiology Reports Dg Chest 2 View  11/03/2015  CLINICAL DATA:  Shortness of breath. EXAM: CHEST  2 VIEW COMPARISON:  May 03, 2015. FINDINGS:  The heart size and mediastinal contours are within normal limits. Both lungs are clear. No pneumothorax or pleural effusion is noted. Left-sided pacemaker is unchanged in position. The visualized skeletal structures are unremarkable. IMPRESSION: No active cardiopulmonary disease. Electronically Signed   By: Marijo Conception, M.D.   On: 11/03/2015 19:30   Ct Chest W Contrast  11/08/2015  CLINICAL DATA:  History of colon lesion, cardiomyopathy, chronic kidney disease, diabetes and hypertension EXAM: CT CHEST, ABDOMEN, AND PELVIS WITH CONTRAST TECHNIQUE: Multidetector CT imaging of the chest, abdomen and pelvis was performed following the standard protocol during bolus administration of intravenous contrast. CONTRAST:  140mL ISOVUE-300 IOPAMIDOL (ISOVUE-300) INJECTION 61% COMPARISON:  Chest x-ray of 11/03/2015 FINDINGS: CT CHEST On lung window images, no evidence of lung metastasis is seen. There are small bilateral pleural effusions present. In addition there are foci of higher attenuation within both lower lobes posteriorly most suspicious for prior aspiration. There are somewhat prominent interstitial markings to the pleura as well which could represent mild changes of interstitial edema. There are degenerative changes throughout the thoracic spine, in the shoulders, and in the sternoclavicular joints. On lung window images, the thyroid gland contains a few small low-attenuation  nodules of less than 1 cm in size, of doubtful clinical significance in this age patient. The thoracic aorta opacifies with no significant abnormality noted. The pulmonary arteries also are faintly opacify with no central abnormality evident. The heart is mildly enlarged and there is a small pericardial effusion present. Pacer wires are noted. CT ABDOMEN AND PELVIS The liver enhances with no focal abnormality and no ductal dilatation is seen. No calcified gallstones are noted. The pancreas is normal in size in the pancreatic duct is not  dilated. There may be a small lipoma within the third portion of the duodenum. The adrenal glands and the spleen are unremarkable. The stomach is decompressed. There is a small left lower pole renal calculus of approximately 5 mm. No hydronephrosis is seen. On delayed images the pelvocaliceal systems are unremarkable. Small low-attenuation structures are noted bilaterally involving both kidneys most consistent with small cysts but difficult to characterize on this study. Ultrasound may be helpful if further assessment is warranted clinically. The proximal ureters are normal in caliber. The abdominal aorta is normal in caliber with moderate atherosclerotic change. No adenopathy is seen. The urinary bladder is not optimally distended but no abnormality is seen. The urinary bladder is slightly thick walled, most likely due to a degree of bladder outlet obstruction caused by a significant enlarged prostate measuring 6.5 x 6.2 cm. The colon is largely decompressed and no obvious colonic mass is seen. Areas of contraction i.e. in the distal transverse colon near the splenic flexure of the could conceivably represent a colonic lesion, but this area is poorly distended. It is noted that within the ascending colon near the hepatic flexure of colon there is and apparent lipoma of 17 mm in diameter with attenuation of -38 age U. scattered diverticula present within the rectosigmoid colon. 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 Also there is significant degenerative change involving the facet joints of the entire lumbar spine. IMPRESSION: 1. Cardiomegaly, small pericardial effusion, small bilateral pleural effusions, and somewhat prominent interstitial markings suggest mild interstitial edema. 2. No evidence of metastatic involvement of the chest, abdomen, or pelvis. 3. The colon is largely decompressed. No obvious mass is seen other than an apparent  lipoma within the ascending colon of 17 mm in diameter. 4. There is an area of poor distension of the distal transverse colon near the splenic flexure and a colonic lesion cannot be excluded in that region. 5. 5 mm left lower pole renal calculus without obstruction. Electronically Signed   By: Ivar Drape M.D.   On: 11/08/2015 08:20   Ct Abdomen Pelvis W Contrast  11/08/2015  CLINICAL DATA:  History of colon lesion, cardiomyopathy, chronic kidney disease, diabetes and hypertension EXAM: CT CHEST, ABDOMEN, AND PELVIS WITH CONTRAST TECHNIQUE: Multidetector CT imaging of the chest, abdomen and pelvis was performed following the standard protocol during bolus administration of intravenous contrast. CONTRAST:  114mL ISOVUE-300 IOPAMIDOL (ISOVUE-300) INJECTION 61% COMPARISON:  Chest x-ray of 11/03/2015 FINDINGS: CT CHEST On lung window images, no evidence of lung metastasis is seen. There are small bilateral pleural effusions present. In addition there are foci of higher attenuation within both lower lobes posteriorly most suspicious for prior aspiration. There are somewhat prominent interstitial markings to the pleura as well which could represent mild changes of interstitial edema. There are degenerative changes throughout the thoracic spine, in the shoulders, and in the sternoclavicular joints. On lung window images, the thyroid gland contains a few small  low-attenuation nodules of less than 1 cm in size, of doubtful clinical significance in this age patient. The thoracic aorta opacifies with no significant abnormality noted. The pulmonary arteries also are faintly opacify with no central abnormality evident. The heart is mildly enlarged and there is a small pericardial effusion present. Pacer wires are noted. CT ABDOMEN AND PELVIS The liver enhances with no focal abnormality and no ductal dilatation is seen. No calcified gallstones are noted. The pancreas is normal in size in the pancreatic duct is not dilated.  There may be a small lipoma within the third portion of the duodenum. The adrenal glands and the spleen are unremarkable. The stomach is decompressed. There is a small left lower pole renal calculus of approximately 5 mm. No hydronephrosis is seen. On delayed images the pelvocaliceal systems are unremarkable. Small low-attenuation structures are noted bilaterally involving both kidneys most consistent with small cysts but difficult to characterize on this study. Ultrasound may be helpful if further assessment is warranted clinically. The proximal ureters are normal in caliber. The abdominal aorta is normal in caliber with moderate atherosclerotic change. No adenopathy is seen. The urinary bladder is not optimally distended but no abnormality is seen. The urinary bladder is slightly thick walled, most likely due to a degree of bladder outlet obstruction caused by a significant enlarged prostate measuring 6.5 x 6.2 cm. The colon is largely decompressed and no obvious colonic mass is seen. Areas of contraction i.e. in the distal transverse colon near the splenic flexure of the could conceivably represent a colonic lesion, but this area is poorly distended. It is noted that within the ascending colon near the hepatic flexure of colon there is and apparent lipoma of 17 mm in diameter with attenuation of -40 age U. scattered diverticula present within the rectosigmoid colon. 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 Also there is significant degenerative change involving the facet joints of the entire lumbar spine. IMPRESSION: 1. Cardiomegaly, small pericardial effusion, small bilateral pleural effusions, and somewhat prominent interstitial markings suggest mild interstitial edema. 2. No evidence of metastatic involvement of the chest, abdomen, or pelvis. 3. The colon is largely decompressed. No obvious mass is seen other than an apparent lipoma within  the ascending colon of 17 mm in diameter. 4. There is an area of poor distension of the distal transverse colon near the splenic flexure and a colonic lesion cannot be excluded in that region. 5. 5 mm left lower pole renal calculus without obstruction. Electronically Signed   By: Ivar Drape M.D.   On: 11/08/2015 08:20   Dg Abd Portable 1v  11/05/2015  CLINICAL DATA:  75 year old male with history of colonic neoplasm. EXAM: PORTABLE ABDOMEN - 1 VIEW COMPARISON:  No priors. FINDINGS: Several nondilated gas-filled loops of small bowel are noted throughout the central abdomen. No pathologic dilatation of small bowel or colon. No gross evidence of pneumoperitoneum on these supine images. Clips are seen projecting over the left side of the epigastric region and overlying the right upper quadrant. IMPRESSION: 1. Nonspecific, nonobstructive bowel gas pattern. 2. No pneumoperitoneum. Electronically Signed   By: Vinnie Langton M.D.   On: 11/05/2015 17:34     CBC  Recent Labs Lab 11/06/15 0347 11/06/15 1504 11/07/15 0340 11/08/15 0445 11/09/15 0250  WBC 7.1 7.4 7.6 5.9 5.0  HGB 7.9* 9.1* 8.7* 8.5* 8.4*  HCT 25.5* 29.8* 28.3* 28.1* 27.5*  PLT 240 239 206 219 209  MCV 70.6*  72.5* 72.9* 72.1* 72.2*  MCH 21.9* 22.1* 22.4* 21.8* 22.0*  MCHC 31.0 30.5 30.7 30.2 30.5  RDW 22.0* 22.4* 22.9* 23.0* 23.1*    Chemistries   Recent Labs Lab 11/05/15 0452 11/06/15 0347 11/07/15 0340 11/08/15 0445 11/09/15 0250  NA 139 137 136 135 137  K 3.6 4.0 4.2 4.3 4.0  CL 105 107 107 106 108  CO2 25 26 23 23 22   GLUCOSE 137* 124* 142* 114* 116*  BUN 37* 23* 14 11 10   CREATININE 2.60* 2.04* 1.76* 1.82* 1.63*  CALCIUM 9.8 9.6 9.7 10.0 9.7  AST 18 17 17 20 17   ALT 14* 13* 13* 13* 16*  ALKPHOS 60 55 57 53 52  BILITOT 1.5* 1.4* 1.6* 1.7* 1.2   ------------------------------------------------------------------------------------------------------------------ estimated creatinine clearance is 55 mL/min (by C-G  formula based on Cr of 1.63). ------------------------------------------------------------------------------------------------------------------  Recent Labs  11/08/15 0445  HGBA1C 7.3*   ------------------------------------------------------------------------------------------------------------------ No results for input(s): CHOL, HDL, LDLCALC, TRIG, CHOLHDL, LDLDIRECT in the last 72 hours. ------------------------------------------------------------------------------------------------------------------ No results for input(s): TSH, T4TOTAL, T3FREE, THYROIDAB in the last 72 hours.  Invalid input(s): FREET3 ------------------------------------------------------------------------------------------------------------------ No results for input(s): VITAMINB12, FOLATE, FERRITIN, TIBC, IRON, RETICCTPCT in the last 72 hours.  Coagulation profile  Recent Labs Lab 11/03/15 2209 11/04/15 1239 11/05/15 0452  INR 3.85* 2.07* 1.54*    No results for input(s): DDIMER in the last 72 hours.  Cardiac Enzymes  Recent Labs Lab 11/03/15 1913  TROPONINI <0.03   ------------------------------------------------------------------------------------------------------------------ Invalid input(s): POCBNP   CBG:  Recent Labs Lab 11/08/15 1659 11/08/15 2010 11/08/15 2342 11/09/15 0443 11/09/15 0835  GLUCAP 159* 160* 152* 126* 174*       Studies: Ct Chest W Contrast  11/08/2015  CLINICAL DATA:  History of colon lesion, cardiomyopathy, chronic kidney disease, diabetes and hypertension EXAM: CT CHEST, ABDOMEN, AND PELVIS WITH CONTRAST TECHNIQUE: Multidetector CT imaging of the chest, abdomen and pelvis was performed following the standard protocol during bolus administration of intravenous contrast. CONTRAST:  177mL ISOVUE-300 IOPAMIDOL (ISOVUE-300) INJECTION 61% COMPARISON:  Chest x-ray of 11/03/2015 FINDINGS: CT CHEST On lung window images, no evidence of lung metastasis is seen. There are  small bilateral pleural effusions present. In addition there are foci of higher attenuation within both lower lobes posteriorly most suspicious for prior aspiration. There are somewhat prominent interstitial markings to the pleura as well which could represent mild changes of interstitial edema. There are degenerative changes throughout the thoracic spine, in the shoulders, and in the sternoclavicular joints. On lung window images, the thyroid gland contains a few small low-attenuation nodules of less than 1 cm in size, of doubtful clinical significance in this age patient. The thoracic aorta opacifies with no significant abnormality noted. The pulmonary arteries also are faintly opacify with no central abnormality evident. The heart is mildly enlarged and there is a small pericardial effusion present. Pacer wires are noted. CT ABDOMEN AND PELVIS The liver enhances with no focal abnormality and no ductal dilatation is seen. No calcified gallstones are noted. The pancreas is normal in size in the pancreatic duct is not dilated. There may be a small lipoma within the third portion of the duodenum. The adrenal glands and the spleen are unremarkable. The stomach is decompressed. There is a small left lower pole renal calculus of approximately 5 mm. No hydronephrosis is seen. On delayed images the pelvocaliceal systems are unremarkable. Small low-attenuation structures are noted bilaterally involving both kidneys most consistent with small cysts but difficult to characterize on this study.  Ultrasound may be helpful if further assessment is warranted clinically. The proximal ureters are normal in caliber. The abdominal aorta is normal in caliber with moderate atherosclerotic change. No adenopathy is seen. The urinary bladder is not optimally distended but no abnormality is seen. The urinary bladder is slightly thick walled, most likely due to a degree of bladder outlet obstruction caused by a significant enlarged prostate  measuring 6.5 x 6.2 cm. The colon is largely decompressed and no obvious colonic mass is seen. Areas of contraction i.e. in the distal transverse colon near the splenic flexure of the could conceivably represent a colonic lesion, but this area is poorly distended. It is noted that within the ascending colon near the hepatic flexure of colon there is and apparent lipoma of 17 mm in diameter with attenuation of -67 age U. scattered diverticula present within the rectosigmoid colon. 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 Also there is significant degenerative change involving the facet joints of the entire lumbar spine. IMPRESSION: 1. Cardiomegaly, small pericardial effusion, small bilateral pleural effusions, and somewhat prominent interstitial markings suggest mild interstitial edema. 2. No evidence of metastatic involvement of the chest, abdomen, or pelvis. 3. The colon is largely decompressed. No obvious mass is seen other than an apparent lipoma within the ascending colon of 17 mm in diameter. 4. There is an area of poor distension of the distal transverse colon near the splenic flexure and a colonic lesion cannot be excluded in that region. 5. 5 mm left lower pole renal calculus without obstruction. Electronically Signed   By: Ivar Drape M.D.   On: 11/08/2015 08:20   Ct Abdomen Pelvis W Contrast  11/08/2015  CLINICAL DATA:  History of colon lesion, cardiomyopathy, chronic kidney disease, diabetes and hypertension EXAM: CT CHEST, ABDOMEN, AND PELVIS WITH CONTRAST TECHNIQUE: Multidetector CT imaging of the chest, abdomen and pelvis was performed following the standard protocol during bolus administration of intravenous contrast. CONTRAST:  154mL ISOVUE-300 IOPAMIDOL (ISOVUE-300) INJECTION 61% COMPARISON:  Chest x-ray of 11/03/2015 FINDINGS: CT CHEST On lung window images, no evidence of lung metastasis is seen. There are small bilateral  pleural effusions present. In addition there are foci of higher attenuation within both lower lobes posteriorly most suspicious for prior aspiration. There are somewhat prominent interstitial markings to the pleura as well which could represent mild changes of interstitial edema. There are degenerative changes throughout the thoracic spine, in the shoulders, and in the sternoclavicular joints. On lung window images, the thyroid gland contains a few small low-attenuation nodules of less than 1 cm in size, of doubtful clinical significance in this age patient. The thoracic aorta opacifies with no significant abnormality noted. The pulmonary arteries also are faintly opacify with no central abnormality evident. The heart is mildly enlarged and there is a small pericardial effusion present. Pacer wires are noted. CT ABDOMEN AND PELVIS The liver enhances with no focal abnormality and no ductal dilatation is seen. No calcified gallstones are noted. The pancreas is normal in size in the pancreatic duct is not dilated. There may be a small lipoma within the third portion of the duodenum. The adrenal glands and the spleen are unremarkable. The stomach is decompressed. There is a small left lower pole renal calculus of approximately 5 mm. No hydronephrosis is seen. On delayed images the pelvocaliceal systems are unremarkable. Small low-attenuation structures are noted bilaterally involving both kidneys most consistent with small cysts but difficult to characterize on this  study. Ultrasound may be helpful if further assessment is warranted clinically. The proximal ureters are normal in caliber. The abdominal aorta is normal in caliber with moderate atherosclerotic change. No adenopathy is seen. The urinary bladder is not optimally distended but no abnormality is seen. The urinary bladder is slightly thick walled, most likely due to a degree of bladder outlet obstruction caused by a significant enlarged prostate measuring 6.5 x  6.2 cm. The colon is largely decompressed and no obvious colonic mass is seen. Areas of contraction i.e. in the distal transverse colon near the splenic flexure of the could conceivably represent a colonic lesion, but this area is poorly distended. It is noted that within the ascending colon near the hepatic flexure of colon there is and apparent lipoma of 17 mm in diameter with attenuation of -49 age U. scattered diverticula present within the rectosigmoid colon. 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 Also there is significant degenerative change involving the facet joints of the entire lumbar spine. IMPRESSION: 1. Cardiomegaly, small pericardial effusion, small bilateral pleural effusions, and somewhat prominent interstitial markings suggest mild interstitial edema. 2. No evidence of metastatic involvement of the chest, abdomen, or pelvis. 3. The colon is largely decompressed. No obvious mass is seen other than an apparent lipoma within the ascending colon of 17 mm in diameter. 4. There is an area of poor distension of the distal transverse colon near the splenic flexure and a colonic lesion cannot be excluded in that region. 5. 5 mm left lower pole renal calculus without obstruction. Electronically Signed   By: Ivar Drape M.D.   On: 11/08/2015 08:20      Lab Results  Component Value Date   HGBA1C 7.3* 11/08/2015   HGBA1C 7.8* 05/01/2015   HGBA1C 7.5* 10/08/2011   Lab Results  Component Value Date   LDLCALC 15 10/09/2011   CREATININE 1.63* 11/09/2015       Scheduled Meds: . benazepril  40 mg Oral Daily  . carvedilol  12.5 mg Oral q morning - 10a  . doxazosin  8 mg Oral QHS  . feeding supplement  1 Container Oral TID BM  . insulin aspart  0-9 Units Subcutaneous Q4H  . pantoprazole  40 mg Oral BID AC  . rosuvastatin  20 mg Oral q1800  . sucralfate  1 g Oral QID   Continuous Infusions: . sodium chloride 50 mL/hr  at 11/08/15 1527     LOS: 6 days    Time spent: >30 MINS    [facility]  [facility] Pager [phone]. If 7PM-7AM, please contact night-coverage at [email or web address], password [facility] 11/09/2015, 10:49 AM  LOS: 6 days

## 2015-11-10 ENCOUNTER — Encounter (HOSPITAL_COMMUNITY)
Admission: EM | Disposition: A | Discharge status: Home / Self Care | Payer: Self-pay | Source: Home / Self Care | Attending: Internal Medicine

## 2015-11-10 ENCOUNTER — Inpatient Hospital Stay (HOSPITAL_COMMUNITY): Payer: Medicare Other | Admitting: Certified Registered Nurse Anesthetist

## 2015-11-10 ENCOUNTER — Encounter (HOSPITAL_COMMUNITY): Payer: Self-pay | Admitting: Anesthesiology

## 2015-11-10 DIAGNOSIS — I5032 Chronic diastolic (congestive) heart failure: Secondary | ICD-10-CM | POA: Diagnosis present

## 2015-11-10 HISTORY — PX: LAPAROSCOPIC PARTIAL COLECTOMY: SHX5907

## 2015-11-10 LAB — COMPREHENSIVE METABOLIC PANEL
ALBUMIN: 2.6 g/dL — AB (ref 3.5–5.0)
ALK PHOS: 48 U/L (ref 38–126)
ALT: 17 U/L (ref 17–63)
AST: 20 U/L (ref 15–41)
Anion gap: 5 (ref 5–15)
BUN: 7 mg/dL (ref 6–20)
CALCIUM: 9.6 mg/dL (ref 8.9–10.3)
CHLORIDE: 108 mmol/L (ref 101–111)
CO2: 22 mmol/L (ref 22–32)
CREATININE: 1.47 mg/dL — AB (ref 0.61–1.24)
GFR calc Af Amer: 52 mL/min — ABNORMAL LOW (ref >60)
GFR calc non Af Amer: 45 mL/min — ABNORMAL LOW (ref >60)
GLUCOSE: 148 mg/dL — AB (ref 65–99)
Potassium: 3.8 mmol/L (ref 3.5–5.1)
SODIUM: 135 mmol/L (ref 135–145)
Total Bilirubin: 1.2 mg/dL (ref 0.3–1.2)
Total Protein: 5.8 g/dL — ABNORMAL LOW (ref 6.5–8.1)

## 2015-11-10 LAB — CBC
HCT: 30.8 % — ABNORMAL LOW (ref 39.0–52.0)
HEMATOCRIT: 27.7 % — AB (ref 39.0–52.0)
Hemoglobin: 8.5 g/dL — ABNORMAL LOW (ref 13.0–17.0)
Hemoglobin: 9.3 g/dL — ABNORMAL LOW (ref 13.0–17.0)
MCH: 21.9 pg — AB (ref 26.0–34.0)
MCH: 22.2 pg — AB (ref 26.0–34.0)
MCHC: 30.7 g/dL (ref 30.0–36.0)
MCHC: 30.2 g/dL (ref 30.0–36.0)
MCV: 71.4 fL — AB (ref 78.0–100.0)
MCV: 73.7 fL — AB (ref 78.0–100.0)
PLATELETS: 220 10*3/uL (ref 150–400)
Platelets: 209 10*3/uL (ref 150–400)
RBC: 3.88 MIL/uL — ABNORMAL LOW (ref 4.22–5.81)
RBC: 4.18 MIL/uL — AB (ref 4.22–5.81)
RDW: 23.1 % — ABNORMAL HIGH (ref 11.5–15.5)
RDW: 22.8 % — AB (ref 11.5–15.5)
WBC: 5.3 10*3/uL (ref 4.0–10.5)
WBC: 5.8 10*3/uL (ref 4.0–10.5)

## 2015-11-10 LAB — SURGICAL PCR SCREEN
MRSA, PCR: NEGATIVE
STAPHYLOCOCCUS AUREUS: POSITIVE — AB

## 2015-11-10 LAB — GLUCOSE, CAPILLARY
GLUCOSE-CAPILLARY: 128 mg/dL — AB (ref 65–99)
GLUCOSE-CAPILLARY: 155 mg/dL — AB (ref 65–99)
GLUCOSE-CAPILLARY: 144 mg/dL — AB (ref 65–99)
GLUCOSE-CAPILLARY: 152 mg/dL — AB (ref 65–99)
GLUCOSE-CAPILLARY: 167 mg/dL — AB (ref 65–99)
GLUCOSE-CAPILLARY: 121 mg/dL — AB (ref 65–99)
Glucose-Capillary: 186 mg/dL — ABNORMAL HIGH (ref 65–99)
Glucose-Capillary: 219 mg/dL — ABNORMAL HIGH (ref 65–99)

## 2015-11-10 LAB — BASIC METABOLIC PANEL
Anion gap: 6 (ref 5–15)
BUN: 8 mg/dL (ref 6–20)
CO2: 20 mmol/L — ABNORMAL LOW (ref 22–32)
CREATININE: 1.49 mg/dL — AB (ref 0.61–1.24)
Calcium: 9.2 mg/dL (ref 8.9–10.3)
Chloride: 111 mmol/L (ref 101–111)
GFR calc Af Amer: 52 mL/min — ABNORMAL LOW (ref >60)
GFR, EST NON AFRICAN AMERICAN: 44 mL/min — AB (ref >60)
GLUCOSE: 168 mg/dL — AB (ref 65–99)
Potassium: 4.1 mmol/L (ref 3.5–5.1)
SODIUM: 137 mmol/L (ref 135–145)

## 2015-11-10 SURGERY — LAPAROSCOPIC PARTIAL COLECTOMY
Anesthesia: General | Site: Abdomen

## 2015-11-10 MED ORDER — MIDAZOLAM HCL 5 MG/5ML IJ SOLN
INTRAMUSCULAR | Status: DC | PRN
  Administered 2015-11-10: 2 mg via INTRAVENOUS

## 2015-11-10 MED ORDER — PROPOFOL 10 MG/ML IV BOLUS
INTRAVENOUS | Status: DC | PRN
  Administered 2015-11-10: 100 mg via INTRAVENOUS
  Administered 2015-11-10: 20 mg via INTRAVENOUS

## 2015-11-10 MED ORDER — 0.9 % SODIUM CHLORIDE (POUR BTL) OPTIME
TOPICAL | Status: DC | PRN
  Administered 2015-11-10 (×2): 2000 mL

## 2015-11-10 MED ORDER — ACETAMINOPHEN 10 MG/ML IV SOLN
INTRAVENOUS | Status: AC
  Filled 2015-11-10: qty 100

## 2015-11-10 MED ORDER — ALVIMOPAN 12 MG PO CAPS
12.0000 mg | ORAL_CAPSULE | Freq: Two times a day (BID) | ORAL | Status: DC
  Administered 2015-11-11 – 2015-11-14 (×7): 12 mg via ORAL
  Filled 2015-11-10 (×9): qty 1

## 2015-11-10 MED ORDER — PHENYLEPHRINE HCL 10 MG/ML IJ SOLN
INTRAMUSCULAR | Status: DC | PRN
  Administered 2015-11-10: 80 ug via INTRAVENOUS

## 2015-11-10 MED ORDER — LACTATED RINGERS IV SOLN
INTRAVENOUS | Status: DC | PRN
  Administered 2015-11-10: 13:00:00 via INTRAVENOUS

## 2015-11-10 MED ORDER — SODIUM CHLORIDE 0.9 % IR SOLN
Status: DC | PRN
  Administered 2015-11-10: 1000 mL

## 2015-11-10 MED ORDER — FENTANYL CITRATE (PF) 100 MCG/2ML IJ SOLN
INTRAMUSCULAR | Status: DC | PRN
  Administered 2015-11-10 (×4): 50 ug via INTRAVENOUS

## 2015-11-10 MED ORDER — ROCURONIUM BROMIDE 100 MG/10ML IV SOLN
INTRAVENOUS | Status: DC | PRN
  Administered 2015-11-10: 40 mg via INTRAVENOUS
  Administered 2015-11-10 (×4): 10 mg via INTRAVENOUS

## 2015-11-10 MED ORDER — CEFOTETAN DISODIUM-DEXTROSE 2-2.08 GM-% IV SOLR
INTRAVENOUS | Status: AC
  Filled 2015-11-10: qty 50

## 2015-11-10 MED ORDER — BUPIVACAINE-EPINEPHRINE 0.25% -1:200000 IJ SOLN
INTRAMUSCULAR | Status: DC | PRN
  Administered 2015-11-10: 13 mL
  Administered 2015-11-10: 10 mL

## 2015-11-10 MED ORDER — POTASSIUM CHLORIDE IN NACL 20-0.45 MEQ/L-% IV SOLN
INTRAVENOUS | Status: DC
  Administered 2015-11-10 – 2015-11-14 (×5): via INTRAVENOUS
  Filled 2015-11-10 (×12): qty 1000

## 2015-11-10 MED ORDER — SODIUM CHLORIDE 0.9% FLUSH: 9.0000 mL | INTRAVENOUS | Status: DC | PRN

## 2015-11-10 MED ORDER — NEOSTIGMINE METHYLSULFATE 10 MG/10ML IV SOLN
INTRAVENOUS | Status: DC | PRN
  Administered 2015-11-10: 4 mg via INTRAVENOUS

## 2015-11-10 MED ORDER — PHENYLEPHRINE HCL 10 MG/ML IJ SOLN
10.0000 mg | INTRAVENOUS | Status: DC | PRN
  Administered 2015-11-10: 40 ug/min via INTRAVENOUS

## 2015-11-10 MED ORDER — LIDOCAINE HCL (CARDIAC) 20 MG/ML IV SOLN
INTRAVENOUS | Status: DC | PRN
  Administered 2015-11-10: 100 mg via INTRAVENOUS

## 2015-11-10 MED ORDER — LACTATED RINGERS IV SOLN
INTRAVENOUS | Status: DC
  Administered 2015-11-10: 12:00:00 via INTRAVENOUS

## 2015-11-10 MED ORDER — GLYCOPYRROLATE 0.2 MG/ML IJ SOLN
INTRAMUSCULAR | Status: DC | PRN
  Administered 2015-11-10: 0.1 mg via INTRAVENOUS
  Administered 2015-11-10: 0.6 mg via INTRAVENOUS

## 2015-11-10 MED ORDER — DIPHENHYDRAMINE HCL 50 MG/ML IJ SOLN
12.5000 mg | Freq: Four times a day (QID) | INTRAMUSCULAR | Status: DC | PRN
  Administered 2015-11-12: 12.5 mg via INTRAVENOUS
  Filled 2015-11-10: qty 1

## 2015-11-10 MED ORDER — DIPHENHYDRAMINE HCL 12.5 MG/5ML PO ELIX: 12.5000 mg | ORAL_SOLUTION | Freq: Four times a day (QID) | ORAL | Status: DC | PRN

## 2015-11-10 MED ORDER — EPHEDRINE SULFATE 50 MG/ML IJ SOLN
INTRAMUSCULAR | Status: DC | PRN
  Administered 2015-11-10: 10 mg via INTRAVENOUS

## 2015-11-10 MED ORDER — ACETAMINOPHEN 10 MG/ML IV SOLN
1000.0000 mg | Freq: Four times a day (QID) | INTRAVENOUS | Status: AC
  Administered 2015-11-10 – 2015-11-11 (×3): 1000 mg via INTRAVENOUS
  Filled 2015-11-10 (×4): qty 100

## 2015-11-10 MED ORDER — HEPARIN SODIUM (PORCINE) 5000 UNIT/ML IJ SOLN
5000.0000 [IU] | Freq: Three times a day (TID) | INTRAMUSCULAR | Status: DC
  Administered 2015-11-11 – 2015-11-15 (×12): 5000 [IU] via SUBCUTANEOUS
  Filled 2015-11-10 (×11): qty 1

## 2015-11-10 MED ORDER — NALOXONE HCL 0.4 MG/ML IJ SOLN: 0.4000 mg | INTRAMUSCULAR | Status: DC | PRN

## 2015-11-10 MED ORDER — BUPIVACAINE-EPINEPHRINE (PF) 0.25% -1:200000 IJ SOLN
INTRAMUSCULAR | Status: AC
Start: 2015-11-10 — End: 2015-11-10
  Filled 2015-11-10: qty 30

## 2015-11-10 MED ORDER — HYDROMORPHONE HCL 1 MG/ML IJ SOLN
INTRAMUSCULAR | Status: AC
Start: 2015-11-10 — End: 2015-11-11
  Filled 2015-11-10: qty 1

## 2015-11-10 MED ORDER — ONDANSETRON HCL 4 MG/2ML IJ SOLN: 4.0000 mg | Freq: Four times a day (QID) | INTRAMUSCULAR | Status: DC | PRN

## 2015-11-10 MED ORDER — SUCCINYLCHOLINE CHLORIDE 20 MG/ML IJ SOLN
INTRAMUSCULAR | Status: DC | PRN
  Administered 2015-11-10: 120 mg via INTRAVENOUS

## 2015-11-10 MED ORDER — ACETAMINOPHEN 10 MG/ML IV SOLN
INTRAVENOUS | Status: DC | PRN
  Administered 2015-11-10: 1000 mg via INTRAVENOUS

## 2015-11-10 MED ORDER — HEPARIN SODIUM (PORCINE) 5000 UNIT/ML IJ SOLN: 5000.0000 [IU] | Freq: Once | INTRAMUSCULAR | Status: DC

## 2015-11-10 MED ORDER — MORPHINE SULFATE 2 MG/ML IV SOLN
INTRAVENOUS | Status: DC
  Administered 2015-11-10: 20:00:00 via INTRAVENOUS
  Administered 2015-11-11 – 2015-11-12 (×7): 0 mg via INTRAVENOUS
  Filled 2015-11-10: qty 25

## 2015-11-10 MED ORDER — HEPARIN SODIUM (PORCINE) 5000 UNIT/ML IJ SOLN
INTRAMUSCULAR | Status: AC
  Administered 2015-11-10: 5000 [IU]
  Filled 2015-11-10: qty 1

## 2015-11-10 MED ORDER — HYDROMORPHONE HCL 1 MG/ML IJ SOLN
0.2500 mg | INTRAMUSCULAR | Status: DC | PRN
Start: 2015-11-10 — End: 2015-11-10
  Administered 2015-11-10: 0.25 mg via INTRAVENOUS

## 2015-11-10 SURGICAL SUPPLY — 87 items
ADH SKN CLS APL DERMABOND .7 (GAUZE/BANDAGES/DRESSINGS) ×1
APPLIER CLIP ROT 10 11.4 M/L (STAPLE)
APR CLP MED LRG 11.4X10 (STAPLE)
BLADE SURG ROTATE 9660 (MISCELLANEOUS) ×2 IMPLANT
CANISTER SUCTION 2500CC (MISCELLANEOUS) ×6 IMPLANT
CELLS DAT CNTRL 66122 CELL SVR (MISCELLANEOUS) IMPLANT
CHLORAPREP W/TINT 26ML (MISCELLANEOUS) ×3 IMPLANT
CLIP APPLIE ROT 10 11.4 M/L (STAPLE) IMPLANT
COVER MAYO STAND STRL (DRAPES) ×6 IMPLANT
COVER SURGICAL LIGHT HANDLE (MISCELLANEOUS) ×6 IMPLANT
DERMABOND ADVANCED (GAUZE/BANDAGES/DRESSINGS) ×2
DERMABOND ADVANCED .7 DNX12 (GAUZE/BANDAGES/DRESSINGS) ×1 IMPLANT
DRAPE PROXIMA HALF (DRAPES) IMPLANT
DRAPE UTILITY XL STRL (DRAPES) ×6 IMPLANT
DRAPE WARM FLUID 44X44 (DRAPE) ×3 IMPLANT
DRSG OPSITE POSTOP 4X10 (GAUZE/BANDAGES/DRESSINGS) IMPLANT
DRSG OPSITE POSTOP 4X6 (GAUZE/BANDAGES/DRESSINGS) ×3 IMPLANT
DRSG OPSITE POSTOP 4X8 (GAUZE/BANDAGES/DRESSINGS) IMPLANT
DRSG TEGADERM 4X4.75 (GAUZE/BANDAGES/DRESSINGS) ×3 IMPLANT
ELECT BLADE 6.5 EXT (BLADE) ×2 IMPLANT
ELECT CAUTERY BLADE 6.4 (BLADE) ×6 IMPLANT
ELECT REM PT RETURN 9FT ADLT (ELECTROSURGICAL) ×3
ELECTRODE REM PT RTRN 9FT ADLT (ELECTROSURGICAL) ×1 IMPLANT
GAUZE SPONGE 4X4 12PLY STRL (GAUZE/BANDAGES/DRESSINGS) ×2 IMPLANT
GEL ULTRASOUND 20GR AQUASONIC (MISCELLANEOUS) IMPLANT
GLOVE BIO SURGEON STRL SZ7.5 (GLOVE) ×2 IMPLANT
GLOVE BIOGEL M STRL SZ7.5 (GLOVE) ×6 IMPLANT
GLOVE BIOGEL PI IND STRL 7.0 (GLOVE) ×5 IMPLANT
GLOVE BIOGEL PI IND STRL 7.5 (GLOVE) ×1 IMPLANT
GLOVE BIOGEL PI IND STRL 8 (GLOVE) ×2 IMPLANT
GLOVE BIOGEL PI INDICATOR 7.0 (GLOVE) ×10
GLOVE BIOGEL PI INDICATOR 7.5 (GLOVE) ×2
GLOVE BIOGEL PI INDICATOR 8 (GLOVE) ×4
GLOVE SURG SIGNA 7.5 PF LTX (GLOVE) ×6 IMPLANT
GLOVE SURG SS PI 6.5 STRL IVOR (GLOVE) ×4 IMPLANT
GLOVE SURG SS PI 7.0 STRL IVOR (GLOVE) ×9 IMPLANT
GLOVE SURG SS PI 7.5 STRL IVOR (GLOVE) ×6 IMPLANT
GOWN STRL REUS W/ TWL LRG LVL3 (GOWN DISPOSABLE) ×5 IMPLANT
GOWN STRL REUS W/ TWL XL LVL3 (GOWN DISPOSABLE) ×4 IMPLANT
GOWN STRL REUS W/TWL LRG LVL3 (GOWN DISPOSABLE) ×15
GOWN STRL REUS W/TWL XL LVL3 (GOWN DISPOSABLE) ×12
KIT BASIN OR (CUSTOM PROCEDURE TRAY) ×3 IMPLANT
LEGGING LITHOTOMY PAIR STRL (DRAPES) ×3 IMPLANT
LIGASURE IMPACT 36 18CM CVD LR (INSTRUMENTS) ×3 IMPLANT
NEEDLE HYPO 25GX1X1/2 BEV (NEEDLE) ×3 IMPLANT
NS IRRIG 1000ML POUR BTL (IV SOLUTION) ×10 IMPLANT
PAD ARMBOARD 7.5X6 YLW CONV (MISCELLANEOUS) ×9 IMPLANT
PENCIL BUTTON HOLSTER BLD 10FT (ELECTRODE) ×6 IMPLANT
RELOAD PROXIMATE 75MM BLUE (ENDOMECHANICALS) ×6 IMPLANT
RTRCTR WOUND ALEXIS 18CM MED (MISCELLANEOUS)
SCALPEL HARMONIC ACE (MISCELLANEOUS) ×3 IMPLANT
SCISSORS LAP 5X35 DISP (ENDOMECHANICALS) ×3 IMPLANT
SET IRRIG TUBING LAPAROSCOPIC (IRRIGATION / IRRIGATOR) ×3 IMPLANT
SLEEVE ENDOPATH XCEL 5M (ENDOMECHANICALS) ×13 IMPLANT
SOL PREP POV-IOD 4OZ 10% (MISCELLANEOUS) ×3 IMPLANT
SPECIMEN JAR LARGE (MISCELLANEOUS) IMPLANT
SPONGE LAP 18X18 X RAY DECT (DISPOSABLE) ×2 IMPLANT
STAPLER PROXIMATE 75MM BLUE (STAPLE) ×3 IMPLANT
STAPLER VISISTAT 35W (STAPLE) ×3 IMPLANT
SUCTION POOLE TIP (SUCTIONS) ×3 IMPLANT
SURGILUBE 2OZ TUBE FLIPTOP (MISCELLANEOUS) ×3 IMPLANT
SUT MNCRL AB 4-0 PS2 18 (SUTURE) ×9 IMPLANT
SUT PDS AB 1 TP1 54 (SUTURE) ×12 IMPLANT
SUT PDS AB 1 TP1 96 (SUTURE) IMPLANT
SUT PROLENE 2 0 CT2 30 (SUTURE) IMPLANT
SUT PROLENE 2 0 KS (SUTURE) IMPLANT
SUT SILK 2 0 (SUTURE)
SUT SILK 2 0 SH CR/8 (SUTURE) ×6 IMPLANT
SUT SILK 2-0 18XBRD TIE 12 (SUTURE) IMPLANT
SUT SILK 3 0 (SUTURE)
SUT SILK 3 0 SH CR/8 (SUTURE) ×3 IMPLANT
SUT SILK 3-0 18XBRD TIE 12 (SUTURE) IMPLANT
SYR BULB IRRIGATION 50ML (SYRINGE) ×6 IMPLANT
SYR CONTROL 10ML LL (SYRINGE) ×3 IMPLANT
SYS LAPSCP GELPORT 120MM (MISCELLANEOUS) ×3
SYSTEM LAPSCP GELPORT 120MM (MISCELLANEOUS) IMPLANT
TOWEL OR 17X26 10 PK STRL BLUE (TOWEL DISPOSABLE) ×3 IMPLANT
TRAY FOLEY CATH 16FRSI W/METER (SET/KITS/TRAYS/PACK) ×3 IMPLANT
TRAY LAPAROSCOPIC MC (CUSTOM PROCEDURE TRAY) ×3 IMPLANT
TRAY PROCTOSCOPIC FIBER OPTIC (SET/KITS/TRAYS/PACK) ×3 IMPLANT
TROCAR XCEL BLUNT TIP 100MML (ENDOMECHANICALS) IMPLANT
TROCAR XCEL NON-BLD 11X100MML (ENDOMECHANICALS) IMPLANT
TROCAR XCEL NON-BLD 5MMX100MML (ENDOMECHANICALS) ×3 IMPLANT
TUBE CONNECTING 12'X1/4 (SUCTIONS) ×3
TUBE CONNECTING 12X1/4 (SUCTIONS) ×6 IMPLANT
TUBING FILTER THERMOFLATOR (ELECTROSURGICAL) ×3 IMPLANT
YANKAUER SUCT BULB TIP NO VENT (SUCTIONS) ×9 IMPLANT

## 2015-11-10 NOTE — Brief Op Note (Signed)
11/03/2015 - 11/10/2015  4:57 PM  PATIENT:  Ernest Lara  75 y.o. male  PRE-OPERATIVE DIAGNOSIS:  Transverse COLON CANCER  POST-OPERATIVE DIAGNOSIS:  same  PROCEDURE:  Procedure(s): LAPAROSCOPIC LEFT COLECTOMY LAPAROSCOPIC MOBILIZATION OF SPLENIC FLEXURE (N/A)  SURGEON:  Surgeon(s) and Role:    * Alphonsa Overall, MD - Assisting    * Greer Pickerel, MD - Primary  PHYSICIAN ASSISTANT:   ASSISTANTS: see above   ANESTHESIA:   general  EBL:  Total I/O In: 2585 [I.V.:2250; Blood:335] Out: [id number] [Urine:495; Blood:50]  BLOOD ADMINISTERED:300 CC PRBC  DRAINS: Urinary Catheter (Foley)   LOCAL MEDICATIONS USED:  MARCAINE     SPECIMEN:  Source of Specimen:  distal transverse colone, stitch proximal  DISPOSITION OF SPECIMEN:  PATHOLOGY  COUNTS:  YES  TOURNIQUET:  * No tourniquets in log *  DICTATION: .Other Dictation: Dictation Number [id number]  PLAN OF CARE: PACU   PATIENT DISPOSITION:  PACU - hemodynamically stable.   Delay start of Pharmacological VTE agent (>24hrs) due to surgical blood loss or risk of bleeding: no  Leighton Ruff. Redmond Pulling, MD, FACS General, Bariatric, & Minimally Invasive Surgery [facility], [place]

## 2015-11-10 NOTE — Progress Notes (Signed)
Report called to [place] in [place].  Karie Kirks, RN

## 2015-11-10 NOTE — Progress Notes (Signed)
Patient Name: Ernest Lara Date of Encounter: 11/10/2015  Principal Problem:   GI bleed Active Problems:   DM (diabetes mellitus), type 2    HTN (hypertension)   PAF (paroxysmal atrial fibrillation), recent DCCV 10/11/11 to SR.   Hyperlipidemia   Complete heart block (HCC)   Acute blood loss anemia   Acute kidney injury superimposed on chronic kidney disease ([place])   Colon tumor   Iron deficiency anemia   Chronic blood loss anemia   Primary Cardiologist: Dr. Sallyanne Kuster Patient Profile: 75 yo male w/ hx PAF on Xarelto (CHADS2VASC=3), HTN, DM, CHB s/p BSci CRT-P, CKD III, remote NICM w/ EF now nl (echo 04/2015) who was admitted on 05/26 w/ GIB, Hgb 4.6 and found to have a malignant colon mass.  SUBJECTIVE:Anxious about surgery. No other complaints.    OBJECTIVE Filed Vitals:   11/09/15 1305 11/09/15 1626 11/09/15 1955 11/10/15 0455  BP: 128/59 158/68 128/68 131/82  Pulse: 63 58 61 70  Temp: 98.3 F (36.8 C) 97.1 F (36.2 C) 98 F (36.7 C) 98.3 F (36.8 C)  TempSrc: Oral Oral Oral Oral  Resp: 18 18 18 18   Height:      Weight:    261 lb 14.4 oz (118.797 kg)  SpO2:  98% 100% 97%    Intake/Output Summary (Last 24 hours) at 11/10/15 0848 Last data filed at 11/10/15 [id number]  Gross per 24 hour  Intake 1036.25 ml  Output   1550 ml  Net -513.75 ml   Filed Weights   11/08/15 0454 11/09/15 0451 11/10/15 0455  Weight: 257 lb 14.4 oz (116.983 kg) 259 lb 9.6 oz (117.754 kg) 261 lb 14.4 oz (118.797 kg)    PHYSICAL EXAM General: Well developed, well nourished, male in no acute distress. Head: Normocephalic, atraumatic.  Neck: Supple without bruits, no JVD. Lungs:  Resp regular and unlabored, CTA. Heart: RRR, S1, S2, no S3, S4, or murmur; no rub. Abdomen: Soft, non-tender, non-distended, BS + x 4.  Extremities: No clubbing, cyanosis, no edema.  Neuro: Alert and oriented X 3. Moves all extremities spontaneously. Psych: Normal affect.  LABS: CBC: Recent Labs   11/09/15 0250 11/10/15 0312  WBC 5.0 5.3  HGB 8.4* 8.5*  HCT 27.5* 27.7*  MCV 72.2* 71.4*  PLT 209 [id number]   Basic Metabolic Panel: Recent Labs  11/09/15 0250 11/10/15 0312  NA 137 135  K 4.0 3.8  CL 108 108  CO2 22 22  GLUCOSE 116* 148*  BUN 10 7  CREATININE 1.63* 1.47*  CALCIUM 9.7 9.6   Liver Function Tests: Recent Labs  11/09/15 0250 11/10/15 0312  AST 17 20  ALT 16* 17  ALKPHOS 52 48  BILITOT 1.2 1.2  PROT 5.7* 5.8*  ALBUMIN 2.6* 2.6*   BNP:  B NATRIURETIC PEPTIDE  Date/Time Value Ref Range Status  11/03/2015 07:13 PM 215.8* 0.0 - 100.0 pg/mL Final    Hemoglobin A1C: Recent Labs  11/08/15 0445  HGBA1C 7.3*     Current facility-administered medications:  .  0.9 %  sodium chloride infusion, , Intravenous, Continuous, Emina Riebock, NP, Last Rate: 50 mL/hr at 11/08/15 1527 .  0.9 %  sodium chloride infusion, , Intravenous, Continuous, Earnstine Regal, PA-C, Last Rate: 75 mL/hr at 11/09/15 2235 .  acetaminophen (TYLENOL) tablet 650 mg, 650 mg, Oral, Q6H PRN, 650 mg at 11/04/15 1342 **OR** acetaminophen (TYLENOL) suppository 650 mg, 650 mg, Rectal, Q6H PRN, Norval Morton, MD .  albuterol (PROVENTIL) (2.5 MG/3ML) 0.083% nebulizer  solution 2.5 mg, 2.5 mg, Nebulization, Q2H PRN, Norval Morton, MD .  alvimopan (ENTEREG) capsule 12 mg, 12 mg, Oral, Once, Earnstine Regal, PA-C .  benazepril (LOTENSIN) tablet 40 mg, 40 mg, Oral, Daily, Eileen Stanford, PA-C, 40 mg at 11/09/15 0829 .  carvedilol (COREG) tablet 12.5 mg, 12.5 mg, Oral, q morning - 10a, Norval Morton, MD, 12.5 mg at 11/09/15 0949 .  cefoTEtan (CEFOTAN) 2 g in dextrose 5 % 50 mL IVPB, 2 g, Intravenous, to SS-Proc, Earnstine Regal, PA-C .  doxazosin (CARDURA) tablet 8 mg, 8 mg, Oral, QHS, Norval Morton, MD, 8 mg at 11/09/15 2233 .  feeding supplement (BOOST / RESOURCE BREEZE) liquid 1 Container, 1 Container, Oral, TID BM, Emina Riebock, NP, 1 Container at 11/09/15 2035 .  insulin aspart  (novoLOG) injection 0-9 Units, 0-9 Units, Subcutaneous, Q4H, Norval Morton, MD, 3 Units at 11/10/15 0000 .  ondansetron (ZOFRAN) tablet 4 mg, 4 mg, Oral, Q6H PRN **OR** ondansetron (ZOFRAN) injection 4 mg, 4 mg, Intravenous, Q6H PRN, Norval Morton, MD .  pantoprazole (PROTONIX) injection 40 mg, 40 mg, Intravenous, Q12H, Earnstine Regal, PA-C .  rosuvastatin (CRESTOR) tablet 20 mg, 20 mg, Oral, q1800, Norval Morton, MD, 20 mg at 11/09/15 1720 .  sucralfate (CARAFATE) tablet 1 g, 1 g, Oral, QID, Ronald Lobo, MD, 1 g at 11/09/15 2233 .  zolpidem (AMBIEN) tablet 5 mg, 5 mg, Oral, QHS PRN, Greer Pickerel, MD, 5 mg at 11/09/15 2234 . sodium chloride 50 mL/hr at 11/08/15 1527  . sodium chloride 75 mL/hr at 11/09/15 2235    TELE: V paced.       Current Medications:  . alvimopan  12 mg Oral Once  . benazepril  40 mg Oral Daily  . carvedilol  12.5 mg Oral q morning - 10a  . cefoTEtan (CEFOTAN) IV  2 g Intravenous to SS-Proc  . doxazosin  8 mg Oral QHS  . feeding supplement  1 Container Oral TID BM  . insulin aspart  0-9 Units Subcutaneous Q4H  . pantoprazole (PROTONIX) IV  40 mg Intravenous Q12H  . rosuvastatin  20 mg Oral q1800  . sucralfate  1 g Oral QID   . sodium chloride 50 mL/hr at 11/08/15 1527  . sodium chloride 75 mL/hr at 11/09/15 2235    ASSESSMENT AND PLAN: Principal Problem:   GI bleed Active Problems:   DM (diabetes mellitus), type 2    HTN (hypertension)   PAF (paroxysmal atrial fibrillation), recent DCCV 10/11/11 to SR.   Hyperlipidemia   Complete heart block (HCC)   Acute blood loss anemia   Acute kidney injury superimposed on chronic kidney disease (HCC)   Colon tumor   Iron deficiency anemia   Chronic blood loss anemia  1. GI bleed/Colon cancer: he underwent a colonoscopy by Dr. [name] on 5/28 and was found to have a transverse colon mass which was biopsied, multiple polyps which were removed and, a "87mm" polyp in the recto-sigmoid which was biopsied.  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 CT abdomen with no evidence of metastatic spread. Surgery planning for hemicolectomy. Cleared from a cardiology perspective for surgery. Scheduled for today.   2. HTN (hypertension) - good control on current rx. Carvedilol should not be stopped periop to avoid rebound arrhythmia/HTN/CHF. May need IV metoprolol if intestinal transit fails to resume promptly after surgery.  3. PAF: with recent DCCV 10/11/11 to SR. No new episodes of Afib on recent device interrogation.  - Xarelto  stopped due to anemia and presumed GI bleed from ulcer and polyps. Will stay off until cleared to resume after surgery.  - continue coreg  4. HLD: continue statin  5. Complete heart block - s/p [place] Sci CRT-P device. Normal pacemaker function, he is not pacemaker dependent (had normal AV conduction at time of device check), but has committed BiV pacing 100%. No recent atrial fibrillation.   6. Acute kidney injury superimposed on chronic kidney disease ([place]) - initial BUN/CR 44/2.92. ACE held and creat around baseline. Creat improved to 1.63. Will restart Lotensin 40    Signed, Arbutus Leas , NP 8:48 AM 11/10/2015 Pager [phone]  I have seen and examined the patient along with Arbutus Leas , NP.  I have reviewed the chart, notes and new data.  I agree with NP's note.  PLAN: Will follow perioperatively. Try to continue uninterrupted beta blocker therapy.  Sanda Klein, MD, [place] [phone] 11/10/2015, 12:52 PM

## 2015-11-10 NOTE — Transfer of Care (Signed)
Immediate Anesthesia Transfer of Care Note  Patient: Ernest Lara  Procedure(s) Performed: Procedure(s): LAPAROSCOPIC LEFT COLECTOMY LAPAROSCOPIC MOBILIZATION OF SPLENIC FLEXURE (N/A)  Patient Location: PACU  Anesthesia Type:General  Level of Consciousness: awake, alert  and sedated  Airway & Oxygen Therapy: Patient connected to face mask oxygen  Post-op Assessment: Post -op Vital signs reviewed and stable  Post vital signs: stable  Last Vitals:  Filed Vitals:   11/10/15 0455 11/10/15 1139  BP: 131/82   Pulse: 70 58  Temp: 36.8 C 36.6 C  Resp: 18 18    Last Pain:  Filed Vitals:   11/10/15 1142  PainSc: 0-No pain         Complications: No apparent anesthesia complications

## 2015-11-10 NOTE — Care Management Important Message (Signed)
Important Message  Patient Details  Name: Ernest Lara MRN: [id number] Date of Birth: 10-22-1940   Medicare Important Message Given:  Yes    Loann Quill 11/10/2015, 9:31 AM

## 2015-11-10 NOTE — Progress Notes (Signed)
5 Days Post-Op  Subjective: He had a good day, no diarrhea.  No real complaints.  Ready for surgery later today.    Objective: Vital signs in last 24 hours: Temp:  [97.1 F (36.2 C)-98.3 F (36.8 C)] 98.3 F (36.8 C) (06/02 0455) Pulse Rate:  [58-70] 70 (06/02 0455) Resp:  [18] 18 (06/02 0455) BP: (128-158)/(54-82) 131/82 mmHg (06/02 0455) SpO2:  [97 %-100 %] 97 % (06/02 0455) Weight:  [118.797 kg (261 lb 14.4 oz)] 118.797 kg (261 lb 14.4 oz) (06/02 0455) Last BM Date: 11/09/15 480 PO Urine 1175  Afebrile, VSS Creatinine continues to improve, labs OK Intake/Output from previous day: 06/01 0701 - 06/02 0700 In: 1036.3 [P.O.:480; I.V.:556.3] Out: 1175 [Urine:1175] Intake/Output this shift:    General appearance: alert, cooperative and no distress Resp: clear to auscultation bilaterally GI: soft, non-tender; bowel sounds normal; no masses,  no organomegaly  Lab Results:   Recent Labs  11/09/15 0250 11/10/15 0312  WBC 5.0 5.3  HGB 8.4* 8.5*  HCT 27.5* 27.7*  PLT 209 220    BMET  Recent Labs  11/09/15 0250 11/10/15 0312  NA 137 135  K 4.0 3.8  CL 108 108  CO2 22 22  GLUCOSE 116* 148*  BUN 10 7  CREATININE 1.63* 1.47*  CALCIUM 9.7 9.6   PT/INR No results for input(s): LABPROT, INR in the last 72 hours.   Recent Labs Lab 11/06/15 0347 11/07/15 0340 11/08/15 0445 11/09/15 0250 11/10/15 0312  AST 17 17 20 17 20   ALT 13* 13* 13* 16* 17  ALKPHOS 55 57 53 52 48  BILITOT 1.4* 1.6* 1.7* 1.2 1.2  PROT 5.9* 6.1* 6.2* 5.7* 5.8*  ALBUMIN 2.8* 2.8* 2.8* 2.6* 2.6*     Lipase  No results found for: LIPASE   Studies/Results: No results found.  Medications: . alvimopan  12 mg Oral Once  . benazepril  40 mg Oral Daily  . carvedilol  12.5 mg Oral q morning - 10a  . cefoTEtan (CEFOTAN) IV  2 g Intravenous to SS-Proc  . doxazosin  8 mg Oral QHS  . feeding supplement  1 Container Oral TID BM  . insulin aspart  0-9 Units Subcutaneous Q4H  . pantoprazole  (PROTONIX) IV  40 mg Intravenous Q12H  . rosuvastatin  20 mg Oral q1800  . sucralfate  1 g Oral QID   . sodium chloride 50 mL/hr at 11/08/15 1527  . sodium chloride 75 mL/hr at 11/09/15 2235    Assessment/Plan Transverse colon caner,with lagre benign rectosigmoid polyp Multiple colonic polyps Hx of duodenal ulcer/GI bleed Anemia/chronic blood loss PAF; chronic anticoagulation, Xarelto pre admit Hx of Complete heart block with pacer AODM Hypertension Acute on chronic kidney disease Dyslipidemia  BPH FEN: Clear liquids/IV fluids ID: Currently none Cefotetan pre op DVT: Currently SCD's only    Plan:  OR later today.     LOS: 7 days    Anayla Giannetti 11/10/2015 [phone]

## 2015-11-10 NOTE — Progress Notes (Addendum)
Placed on tele to monitor. Beta blocker held heart rate 59

## 2015-11-10 NOTE — Progress Notes (Addendum)
[facility] PROGRESS NOTE  Ernest Lara [id number] DOB: December 07, 1940 DOA: 11/03/2015   PCP: [name], MD     Assessment/Plan: Principal Problem:   GI bleed Active Problems:   DM (diabetes mellitus), type 2    HTN (hypertension)   PAF (paroxysmal atrial fibrillation), recent DCCV 10/11/11 to SR.   Hyperlipidemia   Complete heart block (HCC)   Acute blood loss anemia   Acute kidney injury superimposed on chronic kidney disease (HCC)   Colon tumor   Iron deficiency anemia   Chronic blood loss anemia   75 y.o. male with medical history significant of HTN, HLD, PAF on xarelto, Systolic CHF EF [id number] in 04/2015, CKD stage III, diabetes mellitus type 2, s/p PM; who presents with approximately 3 weeks of progressively worsening weakness. Upon admission patient was seen to be afebrile with blood pressure initially noted to be as low as 99/53. All other vital signs within normal limits. Lab work revealed a hemoglobin of 4.6. Admitted for workup of his anemia and weakness. 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  Continue to hold Xarelto, this may not be continued due to his renal function, he may need an alternative anticoagulant prior to discharge , INR supratherapeutic on admission, reversed with FFP General surgery consulted  for further evaluation of possible malignancy in the transverse colon-recommend left colectomy 6/2 Status post transfusion of 6 units of packed red blood cells, repeat CBC 9.1> 8.7>8.5 Continue Protonix  ,From a cardiac perspective he should be able to proceed with surgery without further cardiac specific testing per cardiology. Continue Coreg perioperatively CEA normal, CT scan chest abdomen and pelvis does not show  any evidence of metastatic disease Would   Request transfer to surgery service as cardiology  already on board and managing patient's cardiac issues,[name], MD would continue following the patient  ultimately needs another colonoscopy, post-operatively, in about one year with Dr. [name]    Acute blood loss anemia: Patient Presents with weakness. Gives history of every other day Aleve/on Xarelto  .baseline hemoglobin around 10.5-11.   Status post 6 units of packed red blood cells and 2 units of FFP  Hemoglobin stable around 8.5, monitor postoperatively   Iron deficiency Start Ferrous sulfate supplementation when able   Paroxysmal Atrial fibrillation/ flutter on chronic anticoagulation: Patient currently in sinus rhythm heart rates controlled less than 100. CHADS2VASC=3.with recent DCCV 10/11/11 to SR. No new episodes of Afib on recent device interrogation - Held Xarelto , this may need to be discontinued given GFR of 23 - Continue Coreg    Acute kidney injury on chronic kidney disease stage III: Baseline creatinine noted to be somewhere around 1.4-1.9 upon review of previous admissions. However, creatinine acutely elevated to 2.92 with a BUN of 44 on admission. 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  Hyperlipidemia - Continue Crestor   BPH - Continue doxazosin  Insomnia - Benadryl prn   DVT prophylaxsis SCDs  Code Status:  Full code     Family Communication: Discussed in detail with the patient/daughter, all imaging results, lab results explained to the patient   Disposition Plan:  Anticipate surgery tomorrow    Consultants:  Gastroenterology  Cardiology  Gen. surgery  Procedures:  None  Antibiotics: Anti-infectives    Start     Dose/Rate Route Frequency Ordered Stop   11/10/15 1230  cefoTEtan (CEFOTAN) 2 g in dextrose 5 % 50 mL IVPB     2 g 100 mL/hr over 30 Minutes Intravenous To ShortStay Procedural 11/09/15 1131 11/11/15 1230   11/09/15 1145  cefoTEtan (CEFOTAN) 2 g in dextrose 5 % 50 mL IVPB  Status:  Discontinued     2 g 100 mL/hr over 30 Minutes Intravenous On call to O.R. 11/09/15 1131 11/09/15 1139         HPI/Subjective: Resting comfortably, anxious about upcoming surgery today,NPO    Objective: Filed Vitals:   11/09/15 1305 11/09/15 1626 11/09/15 1955 11/10/15 0455  BP: 128/59 158/68 128/68 131/82  Pulse: 63 58 61 70  Temp: 98.3 F (36.8 C) 97.1 F (36.2 C) 98 F (36.7 C) 98.3 F (36.8 C)  TempSrc: Oral Oral Oral Oral  Resp: 18 18 18 18   Height:      Weight:    118.797 kg (261 lb 14.4 oz)  SpO2:  98% 100% 97%    Intake/Output Summary (Last 24 hours) at 11/10/15 1136 Last data filed at 11/10/15 [id number]  Gross per 24 hour  Intake 796.25 ml  Output   1275 ml  Net -478.75 ml     General exam: Appears calm and comfortable  Respiratory system: Clear to auscultation. Respiratory effort normal. Cardiovascular system: S1 & S2 heard, RRR. No JVD, murmurs, rubs, gallops or clicks. No pedal edema. Gastrointestinal system: Abdomen is nondistended, soft and nontender. No organomegaly or masses felt. Normal bowel sounds heard. Central nervous system: Alert and oriented. No focal neurological  deficits. Extremities: Symmetric 5 x 5 power. Skin: No rashes, lesions or ulcers Psychiatry: Judgement and insight appear normal. Mood & affect appropriate.     Data Reviewed: I have personally reviewed following labs and imaging studies  Micro Results Recent Results (from the past 240 hour(s))  Surgical pcr screen     Status: Abnormal   Collection Time: 11/10/15  2:23 AM  Result Value Ref Range Status   MRSA, PCR NEGATIVE NEGATIVE Final   Staphylococcus aureus POSITIVE (A) NEGATIVE Final    Comment:        The Xpert SA Assay (FDA approved for NASAL specimens in patients over 36 years of age), is one component of a comprehensive surveillance program.  Test performance has been validated by [facility] for patients greater than or equal to 30 year old. It is not intended to diagnose infection nor to guide or monitor treatment.     Radiology Reports Dg Chest 2 View  11/03/2015  CLINICAL DATA:  Shortness of breath. EXAM: CHEST  2 VIEW COMPARISON:  May 03, 2015. FINDINGS: The heart size and mediastinal contours are within normal limits. Both lungs are clear. No pneumothorax  or pleural effusion is noted. Left-sided pacemaker is unchanged in position. The visualized skeletal structures are unremarkable. IMPRESSION: No active cardiopulmonary disease. Electronically Signed   By: Marijo Conception, M.D.   On: 11/03/2015 19:30   Ct Chest W Contrast  11/08/2015  CLINICAL DATA:  History of colon lesion, cardiomyopathy, chronic kidney disease, diabetes and hypertension EXAM: CT CHEST, ABDOMEN, AND PELVIS WITH CONTRAST TECHNIQUE: Multidetector CT imaging of the chest, abdomen and pelvis was performed following the standard protocol during bolus administration of intravenous contrast. CONTRAST:  158mL ISOVUE-300 IOPAMIDOL (ISOVUE-300) INJECTION 61% COMPARISON:  Chest x-ray of 11/03/2015 FINDINGS: CT CHEST On lung window images, no evidence of lung metastasis is seen. There are small bilateral  pleural effusions present. In addition there are foci of higher attenuation within both lower lobes posteriorly most suspicious for prior aspiration. There are somewhat prominent interstitial markings to the pleura as well which could represent mild changes of interstitial edema. There are degenerative changes throughout the thoracic spine, in the shoulders, and in the sternoclavicular joints. On lung window images, the thyroid gland contains a few small low-attenuation nodules of less than 1 cm in size, of doubtful clinical significance in this age patient. The thoracic aorta opacifies with no significant abnormality noted. The pulmonary arteries also are faintly opacify with no central abnormality evident. The heart is mildly enlarged and there is a small pericardial effusion present. Pacer wires are noted. CT ABDOMEN AND PELVIS The liver enhances with no focal abnormality and no ductal dilatation is seen. No calcified gallstones are noted. The pancreas is normal in size in the pancreatic duct is not dilated. There may be a small lipoma within the third portion of the duodenum. The adrenal glands and the spleen are unremarkable. The stomach is decompressed. There is a small left lower pole renal calculus of approximately 5 mm. No hydronephrosis is seen. On delayed images the pelvocaliceal systems are unremarkable. Small low-attenuation structures are noted bilaterally involving both kidneys most consistent with small cysts but difficult to characterize on this study. Ultrasound may be helpful if further assessment is warranted clinically. The proximal ureters are normal in caliber. The abdominal aorta is normal in caliber with moderate atherosclerotic change. No adenopathy is seen. The urinary bladder is not optimally distended but no abnormality is seen. The urinary bladder is slightly thick walled, most likely due to a degree of bladder outlet obstruction caused by a significant enlarged prostate measuring 6.5 x  6.2 cm. The colon is largely decompressed and no obvious colonic mass is seen. Areas of contraction i.e. in the distal transverse colon near the splenic flexure of the could conceivably represent a colonic lesion, but this area is poorly distended. It is noted that within the ascending colon near the hepatic flexure of colon there is and apparent lipoma of 17 mm in diameter with attenuation of -33 age U. scattered diverticula present within the rectosigmoid colon. 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 Also there is significant degenerative change involving the facet joints of the entire lumbar spine. IMPRESSION: 1. Cardiomegaly, small pericardial effusion, small bilateral pleural effusions, and somewhat prominent interstitial markings suggest mild interstitial edema. 2. No evidence of metastatic involvement of the chest, abdomen, or pelvis. 3. The colon is largely decompressed. No obvious mass is seen other than an apparent lipoma within the ascending colon of 17 mm in diameter. 4. There is an area of poor distension of the distal transverse colon near  the splenic flexure and a colonic lesion cannot be excluded in that region. 5. 5 mm left lower pole renal calculus without obstruction. Electronically Signed   By: Ivar Drape M.D.   On: 11/08/2015 08:20   Ct Abdomen Pelvis W Contrast  11/08/2015  CLINICAL DATA:  History of colon lesion, cardiomyopathy, chronic kidney disease, diabetes and hypertension EXAM: CT CHEST, ABDOMEN, AND PELVIS WITH CONTRAST TECHNIQUE: Multidetector CT imaging of the chest, abdomen and pelvis was performed following the standard protocol during bolus administration of intravenous contrast. CONTRAST:  175mL ISOVUE-300 IOPAMIDOL (ISOVUE-300) INJECTION 61% COMPARISON:  Chest x-ray of 11/03/2015 FINDINGS: CT CHEST On lung window images, no evidence of lung metastasis is seen. There are small bilateral pleural effusions  present. In addition there are foci of higher attenuation within both lower lobes posteriorly most suspicious for prior aspiration. There are somewhat prominent interstitial markings to the pleura as well which could represent mild changes of interstitial edema. There are degenerative changes throughout the thoracic spine, in the shoulders, and in the sternoclavicular joints. On lung window images, the thyroid gland contains a few small low-attenuation nodules of less than 1 cm in size, of doubtful clinical significance in this age patient. The thoracic aorta opacifies with no significant abnormality noted. The pulmonary arteries also are faintly opacify with no central abnormality evident. The heart is mildly enlarged and there is a small pericardial effusion present. Pacer wires are noted. CT ABDOMEN AND PELVIS The liver enhances with no focal abnormality and no ductal dilatation is seen. No calcified gallstones are noted. The pancreas is normal in size in the pancreatic duct is not dilated. There may be a small lipoma within the third portion of the duodenum. The adrenal glands and the spleen are unremarkable. The stomach is decompressed. There is a small left lower pole renal calculus of approximately 5 mm. No hydronephrosis is seen. On delayed images the pelvocaliceal systems are unremarkable. Small low-attenuation structures are noted bilaterally involving both kidneys most consistent with small cysts but difficult to characterize on this study. Ultrasound may be helpful if further assessment is warranted clinically. The proximal ureters are normal in caliber. The abdominal aorta is normal in caliber with moderate atherosclerotic change. No adenopathy is seen. The urinary bladder is not optimally distended but no abnormality is seen. The urinary bladder is slightly thick walled, most likely due to a degree of bladder outlet obstruction caused by a significant enlarged prostate measuring 6.5 x 6.2 cm. The colon  is largely decompressed and no obvious colonic mass is seen. Areas of contraction i.e. in the distal transverse colon near the splenic flexure of the could conceivably represent a colonic lesion, but this area is poorly distended. It is noted that within the ascending colon near the hepatic flexure of colon there is and apparent lipoma of 17 mm in diameter with attenuation of -73 age U. scattered diverticula present within the rectosigmoid colon. 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 Also there is significant degenerative change involving the facet joints of the entire lumbar spine. IMPRESSION: 1. Cardiomegaly, small pericardial effusion, small bilateral pleural effusions, and somewhat prominent interstitial markings suggest mild interstitial edema. 2. No evidence of metastatic involvement of the chest, abdomen, or pelvis. 3. The colon is largely decompressed. No obvious mass is seen other than an apparent lipoma within the ascending colon of 17 mm in diameter. 4. There is an area of poor distension of the distal transverse colon  near the splenic flexure and a colonic lesion cannot be excluded in that region. 5. 5 mm left lower pole renal calculus without obstruction. Electronically Signed   By: Ivar Drape M.D.   On: 11/08/2015 08:20   Dg Abd Portable 1v  11/05/2015  CLINICAL DATA:  75 year old male with history of colonic neoplasm. EXAM: PORTABLE ABDOMEN - 1 VIEW COMPARISON:  No priors. FINDINGS: Several nondilated gas-filled loops of small bowel are noted throughout the central abdomen. No pathologic dilatation of small bowel or colon. No gross evidence of pneumoperitoneum on these supine images. Clips are seen projecting over the left side of the epigastric region and overlying the right upper quadrant. IMPRESSION: 1. Nonspecific, nonobstructive bowel gas pattern. 2. No pneumoperitoneum. Electronically Signed   By: Vinnie Langton  M.D.   On: 11/05/2015 17:34     CBC  Recent Labs Lab 11/06/15 1504 11/07/15 0340 11/08/15 0445 11/09/15 0250 11/10/15 0312  WBC 7.4 7.6 5.9 5.0 5.3  HGB 9.1* 8.7* 8.5* 8.4* 8.5*  HCT 29.8* 28.3* 28.1* 27.5* 27.7*  PLT 239 206 219 209 220  MCV 72.5* 72.9* 72.1* 72.2* 71.4*  MCH 22.1* 22.4* 21.8* 22.0* 21.9*  MCHC 30.5 30.7 30.2 30.5 30.7  RDW 22.4* 22.9* 23.0* 23.1* 23.1*    Chemistries   Recent Labs Lab 11/06/15 0347 11/07/15 0340 11/08/15 0445 11/09/15 0250 11/10/15 0312  NA 137 136 135 137 135  K 4.0 4.2 4.3 4.0 3.8  CL 107 107 106 108 108  CO2 26 23 23 22 22   GLUCOSE 124* 142* 114* 116* 148*  BUN 23* 14 11 10 7   CREATININE 2.04* 1.76* 1.82* 1.63* 1.47*  CALCIUM 9.6 9.7 10.0 9.7 9.6  AST 17 17 20 17 20   ALT 13* 13* 13* 16* 17  ALKPHOS 55 57 53 52 48  BILITOT 1.4* 1.6* 1.7* 1.2 1.2   ------------------------------------------------------------------------------------------------------------------ estimated creatinine clearance is 61.2 mL/min (by C-G formula based on Cr of 1.47). ------------------------------------------------------------------------------------------------------------------  Recent Labs  11/08/15 0445  HGBA1C 7.3*   ------------------------------------------------------------------------------------------------------------------ No results for input(s): CHOL, HDL, LDLCALC, TRIG, CHOLHDL, LDLDIRECT in the last 72 hours. ------------------------------------------------------------------------------------------------------------------ No results for input(s): TSH, T4TOTAL, T3FREE, THYROIDAB in the last 72 hours.  Invalid input(s): FREET3 ------------------------------------------------------------------------------------------------------------------ No results for input(s): VITAMINB12, FOLATE, FERRITIN, TIBC, IRON, RETICCTPCT in the last 72 hours.  Coagulation profile  Recent Labs Lab 11/03/15 2209 11/04/15 1239 11/05/15 0452  INR  3.85* 2.07* 1.54*    No results for input(s): DDIMER in the last 72 hours.  Cardiac Enzymes  Recent Labs Lab 11/03/15 1913  TROPONINI <0.03   ------------------------------------------------------------------------------------------------------------------ Invalid input(s): POCBNP   CBG:  Recent Labs Lab 11/09/15 1555 11/09/15 1953 11/09/15 2351 11/10/15 0450 11/10/15 0800  GLUCAP 146* 154* 219* 128* 155*       Studies: No results found.    Lab Results  Component Value Date   HGBA1C 7.3* 11/08/2015   HGBA1C 7.8* 05/01/2015   HGBA1C 7.5* 10/08/2011   Lab Results  Component Value Date   LDLCALC 15 10/09/2011   CREATININE 1.47* 11/10/2015       Scheduled Meds: . alvimopan  12 mg Oral Once  . benazepril  40 mg Oral Daily  . carvedilol  12.5 mg Oral q morning - 10a  . cefoTEtan (CEFOTAN) IV  2 g Intravenous to SS-Proc  . doxazosin  8 mg Oral QHS  . feeding supplement  1 Container Oral TID BM  . insulin aspart  0-9 Units Subcutaneous Q4H  . pantoprazole (PROTONIX)  IV  40 mg Intravenous Q12H  . rosuvastatin  20 mg Oral q1800  . sucralfate  1 g Oral QID   Continuous Infusions: . sodium chloride 50 mL/hr at 11/08/15 1527  . sodium chloride 75 mL/hr at 11/09/15 2235     LOS: 7 days    Time spent: >30 MINS    [facility] Pager [phone]. If 7PM-7AM, please contact night-coverage at [email or web address], password [facility] 11/10/2015, 11:36 AM  LOS: 7 days

## 2015-11-10 NOTE — Anesthesia Preprocedure Evaluation (Addendum)
Anesthesia Evaluation  Patient identified by MRN, date of birth, ID band Patient awake    Reviewed: Allergy & Precautions, H&P , NPO status , Patient's Chart, lab work & pertinent test results, reviewed documented beta blocker date and time   Airway Mallampati: II  TM Distance: >3 FB Neck ROM: Full    Dental no notable dental hx. (+) Dental Advisory Given, Missing,    Pulmonary sleep apnea ,    Pulmonary exam normal breath sounds clear to auscultation       Cardiovascular hypertension, Pt. on medications and Pt. on home beta blockers +CHF  + dysrhythmias Atrial Fibrillation + pacemaker  Rhythm:Regular Rate:Normal     Neuro/Psych negative neurological ROS  negative psych ROS   GI/Hepatic negative GI ROS, Neg liver ROS,   Endo/Other  diabetes, Type 2, Oral Hypoglycemic Agents  Renal/GU Renal InsufficiencyRenal disease  negative genitourinary   Musculoskeletal   Abdominal   Peds  Hematology negative hematology ROS (+) anemia ,   Anesthesia Other Findings Upper right incisor missing plus various molars  Reproductive/Obstetrics negative OB ROS                        Anesthesia Physical Anesthesia Plan  ASA: III  Anesthesia Plan: General   Post-op Pain Management:    Induction: Intravenous  Airway Management Planned: Oral ETT  Additional Equipment:   Intra-op Plan:   Post-operative Plan: Extubation in OR  Informed Consent: I have reviewed the patients History and Physical, chart, labs and discussed the procedure including the risks, benefits and alternatives for the proposed anesthesia with the patient or authorized representative who has indicated his/her understanding and acceptance.   Dental advisory given  Plan Discussed with: CRNA and Surgeon  Anesthesia Plan Comments:         Anesthesia Quick Evaluation

## 2015-11-10 NOTE — Anesthesia Postprocedure Evaluation (Signed)
Anesthesia Post Note  Patient: Ernest Lara  Procedure(s) Performed: Procedure(s) (LRB): LAPAROSCOPIC LEFT COLECTOMY LAPAROSCOPIC MOBILIZATION OF SPLENIC FLEXURE (N/A)  Patient location during evaluation: PACU Anesthesia Type: General Level of consciousness: oriented, patient uncooperative and sedated Pain management: pain level controlled Vital Signs Assessment: post-procedure vital signs reviewed and stable Respiratory status: spontaneous breathing, nonlabored ventilation, respiratory function stable and patient connected to nasal cannula oxygen Cardiovascular status: blood pressure returned to baseline and stable Postop Assessment: no signs of nausea or vomiting Anesthetic complications: no    Last Vitals:  Filed Vitals:   11/10/15 1727 11/10/15 1730  BP: 117/56   Pulse: 52 55  Temp:  36.3 C  Resp: 15 17    Last Pain:  Filed Vitals:   11/10/15 1733  PainSc: Asleep                 [name],E. Ayrianna Mcginniss

## 2015-11-10 NOTE — Progress Notes (Signed)
Clarified NPO order this am, if MD wants pt  To have med with sips of H20.  Instructed by Modena Jansky, PA, to keep pt NPO, & no meds.  Will continue to monitor.  Karie Kirks, Therapist, sports.

## 2015-11-10 NOTE — Anesthesia Procedure Notes (Signed)
Procedure Name: Intubation Date/Time: 11/10/2015 1:02 PM Performed by: Williemae Area B Pre-anesthesia Checklist: Patient identified, Emergency Drugs available, Suction available and Patient being monitored Patient Re-evaluated:Patient Re-evaluated prior to inductionOxygen Delivery Method: Circle system utilized Preoxygenation: Pre-oxygenation with 100% oxygen Intubation Type: IV induction Ventilation: Mask ventilation without difficulty and Oral airway inserted - appropriate to patient size Laryngoscope Size: Mac and 4 Grade View: Grade II Tube type: Oral Tube size: 7.5 mm Number of attempts: 1 Airway Equipment and Method: Stylet Placement Confirmation: ETT inserted through vocal cords under direct vision,  breath sounds checked- equal and bilateral and positive ETCO2 Secured at: 23 (cm at teeth) cm Tube secured with: Tape Dental Injury: Teeth and Oropharynx as per pre-operative assessment

## 2015-11-11 LAB — COMPREHENSIVE METABOLIC PANEL
ALT: 15 U/L — AB (ref 17–63)
ANION GAP: 8 (ref 5–15)
AST: 20 U/L (ref 15–41)
Albumin: 2.5 g/dL — ABNORMAL LOW (ref 3.5–5.0)
Alkaline Phosphatase: 50 U/L (ref 38–126)
BUN: 8 mg/dL (ref 6–20)
CO2: 21 mmol/L — ABNORMAL LOW (ref 22–32)
CREATININE: 1.56 mg/dL — AB (ref 0.61–1.24)
Calcium: 9.7 mg/dL (ref 8.9–10.3)
Chloride: 108 mmol/L (ref 101–111)
GFR, EST AFRICAN AMERICAN: 49 mL/min — AB (ref >60)
GFR, EST NON AFRICAN AMERICAN: 42 mL/min — AB (ref >60)
Glucose, Bld: 131 mg/dL — ABNORMAL HIGH (ref 65–99)
Potassium: 4.6 mmol/L (ref 3.5–5.1)
Sodium: 137 mmol/L (ref 135–145)
TOTAL PROTEIN: 5.6 g/dL — AB (ref 6.5–8.1)
Total Bilirubin: 1 mg/dL (ref 0.3–1.2)

## 2015-11-11 LAB — CBC
HCT: 32.2 % — ABNORMAL LOW (ref 39.0–52.0)
Hemoglobin: 10 g/dL — ABNORMAL LOW (ref 13.0–17.0)
MCH: 22.7 pg — AB (ref 26.0–34.0)
MCHC: 31.1 g/dL (ref 30.0–36.0)
MCV: 73 fL — AB (ref 78.0–100.0)
PLATELETS: 203 10*3/uL (ref 150–400)
RBC: 4.41 MIL/uL (ref 4.22–5.81)
RDW: 22.7 % — ABNORMAL HIGH (ref 11.5–15.5)
WBC: 11.1 10*3/uL — ABNORMAL HIGH (ref 4.0–10.5)

## 2015-11-11 LAB — GLUCOSE, CAPILLARY
GLUCOSE-CAPILLARY: 130 mg/dL — AB (ref 65–99)
GLUCOSE-CAPILLARY: 135 mg/dL — AB (ref 65–99)
GLUCOSE-CAPILLARY: 146 mg/dL — AB (ref 65–99)
Glucose-Capillary: 134 mg/dL — ABNORMAL HIGH (ref 65–99)
Glucose-Capillary: 141 mg/dL — ABNORMAL HIGH (ref 65–99)

## 2015-11-11 MED ORDER — ZOLPIDEM TARTRATE 5 MG PO TABS
5.0000 mg | ORAL_TABLET | Freq: Once | ORAL | Status: AC
  Administered 2015-11-11: 5 mg via ORAL
  Filled 2015-11-11: qty 1

## 2015-11-11 NOTE — Progress Notes (Signed)
Patient Name: ELISAUL HAMMERMEISTER Date of Encounter: 11/11/2015  Principal Problem:   GI bleed Active Problems:   DM (diabetes mellitus), type 2    HTN (hypertension)   PAF (paroxysmal atrial fibrillation), recent DCCV 10/11/11 to SR.   Hyperlipidemia   Complete heart block (HCC)   Acute blood loss anemia   Acute kidney injury superimposed on chronic kidney disease (HCC)   Colon tumor   Iron deficiency anemia   Chronic blood loss anemia   Chronic diastolic heart failure ([place])   Length of Stay: 8  SUBJECTIVE Postop #1  Pain is well controlled. No flatus yet. No periop CV complications.  CURRENT MEDS . acetaminophen  1,000 mg Intravenous Q6H  . alvimopan  12 mg Oral BID  . benazepril  40 mg Oral Daily  . carvedilol  12.5 mg Oral q morning - 10a  . doxazosin  8 mg Oral QHS  . heparin subcutaneous  5,000 Units Subcutaneous Q8H  . insulin aspart  0-9 Units Subcutaneous Q4H  . morphine   Intravenous Q4H  . pantoprazole (PROTONIX) IV  40 mg Intravenous Q12H    OBJECTIVE   Intake/Output Summary (Last 24 hours) at 11/11/15 1058 Last data filed at 11/11/15 0758  Gross per 24 hour  Intake   3880 ml  Output   1470 ml  Net   2410 ml   Filed Weights   11/08/15 0454 11/09/15 0451 11/10/15 0455  Weight: 116.983 kg (257 lb 14.4 oz) 117.754 kg (259 lb 9.6 oz) 118.797 kg (261 lb 14.4 oz)    PHYSICAL EXAM Filed Vitals:   11/10/15 2335 11/11/15 0354 11/11/15 0400 11/11/15 0729  BP:  148/70  149/75  Pulse:  97  88  Temp:  98.2 F (36.8 C)  98.4 F (36.9 C)  TempSrc:  Oral  Oral  Resp: 18 19 18 17   Height:      Weight:      SpO2: 100% 96% 100% 96%   General: Alert, oriented x3, no distress Head: no evidence of trauma, PERRL, EOMI, no exophtalmos or lid lag, no myxedema, no xanthelasma; normal ears, nose and oropharynx Neck: normal jugular venous pulsations and no hepatojugular reflux; brisk carotid pulses without delay and no carotid bruits Chest: clear to auscultation, no  signs of consolidation by percussion or palpation, normal fremitus, symmetrical and full respiratory excursions Cardiovascular: normal position and quality of the apical impulse, regular rhythm with frequent ectopy, normal first and split second heart sounds, no rubs or gallops, no murmur Abdomen:  mild distention, no masses by palpation, no abnormal pulsatility or arterial bruits, absent bowel sounds, no hepatosplenomegaly Extremities: no clubbing, cyanosis or edema; 2+ radial, ulnar and brachial pulses bilaterally; 2+ right femoral, posterior tibial and dorsalis pedis pulses; 2+ left femoral, posterior tibial and dorsalis pedis pulses; no subclavian or femoral bruits Neurological: grossly nonfocal  LABS  CBC  Recent Labs  11/10/15 1823 11/11/15 0453  WBC 5.8 11.1*  HGB 9.3* 10.0*  HCT 30.8* 32.2*  MCV 73.7* 73.0*  PLT 209 [id number]   Basic Metabolic Panel  Recent Labs  11/10/15 1823 11/11/15 0453  NA 137 137  K 4.1 4.6  CL 111 108  CO2 20* 21*  GLUCOSE 168* 131*  BUN 8 8  CREATININE 1.49* 1.56*  CALCIUM 9.2 9.7   Liver Function Tests  Recent Labs  11/10/15 0312 11/11/15 0453  AST 20 20  ALT 17 15*  ALKPHOS 48 50  BILITOT 1.2 1.0  PROT 5.8* 5.6*  ALBUMIN 2.6* 2.5*   Radiology Studies Imaging results have been reviewed   TELE A sensed (sinus), V paced, frequent PVCs    ASSESSMENT AND PLAN   1. GI bleed/Colon cancer: s/p hemicolectomy. Would keep off anticoagulation for a total of 4-6 weeks to allow for healing of peptic ulcers and surgical site.   2. HTN (hypertension) Fair control on current rx. Carvedilol should not be stopped periop to avoid rebound arrhythmia/HTN/CHF. May need IV metoprolol if intestinal transit fails to resume promptly after surgery.  3. PAF: with recent DCCV 10/11/11 to SR. No new episodes of Afib on recent device interrogation.  - Xarelto stopped due to anemia and presumed GI bleed from ulcer and polyps. Continue coreg  4. HLD:  continue statin  5. Complete heart block - s/p [employer] CRT-P device. Normal pacemaker function, he is not pacemaker dependent (had normal AV conduction at time of device check), but has committed BiV pacing 100%. No recent atrial fibrillation.   6. Acute kidney injury superimposed on chronic kidney disease ([place]) Improved, back on ACEi.   Sanda Klein, MD, [facility] [phone] office [phone] pager 11/11/2015 10:58 AM

## 2015-11-11 NOTE — Progress Notes (Signed)
1 Day Post-Op  Subjective: He looks pretty good.  Has not been OOB, no flatus.  Tolerating pain, with PCA.  Sites all look good, foley in.    Objective: Vital signs in last 24 hours: Temp:  [97.4 F (36.3 C)-98.2 F (36.8 C)] 98.2 F (36.8 C) (06/03 0354) Pulse Rate:  [50-97] 97 (06/03 0354) Resp:  [13-27] 18 (06/03 0400) BP: (109-148)/(52-88) 148/70 mmHg (06/03 0354) SpO2:  [91 %-100 %] 100 % (06/03 0400) Last BM Date: 11/09/15 1495 urine Afebrile, VSS Creatinine up some, H/H is up he was transfused yesterday Glucose is in good control  V paced with some PVC's.   Intake/Output from previous day: 06/02 0701 - 06/03 0700 In: 3880 [I.V.:3345; Blood:335; IV Piggyback:200] Out: [id number] [Urine:1495; Blood:50] Intake/Output this shift: Total I/O In: -  Out: 300 [Urine:300]  General appearance: alert, cooperative and no distress Resp: clear to auscultation bilaterally GI: soft, very sore and tender, BS hypoacitive.  Sites all look good.  Lab Results:   Recent Labs  11/10/15 1823 11/11/15 0453  WBC 5.8 11.1*  HGB 9.3* 10.0*  HCT 30.8* 32.2*  PLT 209 203    BMET  Recent Labs  11/10/15 1823 11/11/15 0453  NA 137 137  K 4.1 4.6  CL 111 108  CO2 20* 21*  GLUCOSE 168* 131*  BUN 8 8  CREATININE 1.49* 1.56*  CALCIUM 9.2 9.7   PT/INR No results for input(s): LABPROT, INR in the last 72 hours.   Recent Labs Lab 11/07/15 0340 11/08/15 0445 11/09/15 0250 11/10/15 0312 11/11/15 0453  AST 17 20 17 20 20   ALT 13* 13* 16* 17 15*  ALKPHOS 57 53 52 48 50  BILITOT 1.6* 1.7* 1.2 1.2 1.0  PROT 6.1* 6.2* 5.7* 5.8* 5.6*  ALBUMIN 2.8* 2.8* 2.6* 2.6* 2.5*     Lipase  No results found for: LIPASE   Studies/Results: No results found.  Medications: . acetaminophen  1,000 mg Intravenous Q6H  . alvimopan  12 mg Oral BID  . benazepril  40 mg Oral Daily  . carvedilol  12.5 mg Oral q morning - 10a  . doxazosin  8 mg Oral QHS  . heparin subcutaneous  5,000 Units  Subcutaneous Q8H  . insulin aspart  0-9 Units Subcutaneous Q4H  . morphine   Intravenous Q4H  . pantoprazole (PROTONIX) IV  40 mg Intravenous Q12H   . 0.45 % NaCl with KCl 20 mEq / L 100 mL/hr at 11/10/15 2103   Assessment/Plan Transverse colon caner,with lagre benign rectosigmoid polyp S/p LAPAROSCOPIC LEFT COLECTOMY LAPAROSCOPIC MOBILIZATION OF SPLENIC FLEXURE, 11/10/15, Dr. Greer Pickerel Multiple colonic polyps Hx of duodenal ulcer/GI bleed Anemia/chronic blood loss PAF; chronic anticoagulation, Xarelto pre admit Hx of Complete heart block with pacer AODM Hypertension Acute on chronic kidney disease Dyslipidemia  BPH FEN: Clear liquids/IV fluids ID: Currently none Cefotetan pre op DVT: Heparin/SCD   Plan:  I will get him OOB today to chair, d/c foley tomorrow, and begin some ambulation at that point.  Work on IS.  Recheck labs in AM.      LOS: 8 days    Ernest Lara 11/11/2015 [phone]

## 2015-11-11 NOTE — Op Note (Signed)
Ernest Lara, Ernest Lara NO.:  [id number]  MEDICAL RECORD NO.:  [id number]  LOCATION:  [id number]                        FACILITY:  [facility]  PHYSICIAN:  Leighton Ruff. Redmond Pulling, MD, FACSDATE OF BIRTH:  [birth year]  DATE OF PROCEDURE:  11/10/2015 DATE OF DISCHARGE:                              OPERATIVE REPORT   PREOPERATIVE DIAGNOSIS:  Transverse colon cancer.  POSTOPERATIVE DIAGNOSIS:  Transverse colon cancer.  PROCEDURE: 1. Laparoscopic left colectomy. 2. Laparoscopic mobilization of splenic flexure.  SURGEON:  Leighton Ruff. Redmond Pulling, MD, FACS.  ASSISTANT SURGEON:  Fenton Malling. Lucia Gaskins, MD.  ANESTHESIA:  General.  ESTIMATED BLOOD LOSS:  50.  SPECIMEN:  Distal transverse colon stitch marks proximal margin.  Local Marcaine.  Blood, 1 unit of PRBC.  INDICATIONS FOR PROCEDURE:  The patient is a 75 year old gentleman, who was admitted with a GI bleed.  He underwent upper and lower endoscopy, which showed a duodenal ulcer which had stopped bleeding.  On his colonoscopy, he was found to have multiple colonic polyps as well as a mass in his mid-to-distal transverse colon that was biopsy proven to be adenocarcinoma.  He also had a 4 cm rectosigmoid polyp that was not amenable to endoscopic resection, however, it was a tubular adenoma.  Since he was in the hospital with his colon prepped and was off his oral blood thinner that he took for heart disease, we were consulted to consider same hospitalization colonic resection.  He underwent a metastatic workup, and there was no evidence of metastatic disease.  He was cleared by Cardiology for surgery.  Prealbumin was 18. I discussed risks and benefits of surgery at length with the patient and his daughter.  Please see the medical record for that discussion.  We discussed that we definitely needed to take out the distal transverse colon where the primary colonic neoplasm was.  We discussed the possibility of also resecting the rectosigmoid  section where the nonresectable polyp was located if they were in close proximity to one another.  However, we did talk about if there is an extended distance between the 2 lesions that we would just proceed with taking out of the cancer today.  DESCRIPTION OF PROCEDURE:  The patient received 5000 units of subcutaneous heparin preoperatively along with a dose of Entereg.  He was taken to the OR #2 at [facility] and placed supine on the operating table.  General endotracheal anesthesia was established. Sequential compression devices were placed.  He was placed in lithotomy position.  His arms were tucked at the side with the appropriate padding.  His abdomen was prepped and draped in the usual standard surgical fashion with ChloraPrep.  His buttocks and perineum were prepped with Betadine.  A surgical time-out was performed.  He received cefotetan prior to skin incision.  I elected to gain access to his abdomen in the right upper quadrant using the Optiview technique.  A small 0.5 cm incision was made just below the right subcostal margin in the right mid abdomen using a 0 degree 5 mm laparoscope through a 5 mm trocar, advanced it through all layers of the abdominal wall under direct visualization and carefully entered the abdominal cavity. Pneumoperitoneum was  smoothly established up to a pressure of 15 mmHg with no changes in his vital signs.  A 5 mm trocar was placed just in the infraumbilical position and 1 in the right lower quadrant all under direct visualization.  The patient was placed in Trendelenburg and rotated to the right.  I first wanted to find the rectosigmoid tattoo to get an idea of where or how low it was.  The small bowel was swept out of the pelvis.  I identified the tattoo in the lower rectum probably about 2 inches above the peritoneal reflection.  We then placed the patient in reverse Trendelenburg, and I went about locating the transverse colon tattoo.   He had somewhat of a barrel-shaped abdomen. The omentum was lifted up above the transverse colon.  I identified the tattoo, it was in the distal transverse colon to the patient's left of the middle colic vessel.  At this point, I started taking some of the omentum off the transverse colon, starting several inches proximal to the tattoo, this was done with Harmonic scalpel.  I placed a 5 mm trocar in the left upper quadrant under direct visualization to help with retraction.  I continued to take the omentum off the transverse colon up toward the splenic flexure and freed it.  I got into the lesser sac.  At this point, Dr. Lucia Gaskins joined me in the operating room.  He had a very high and long abdomen, so I ended up adding 2 additional 5 mm trocars in the right abdomen under direct visualization.  I then started taking down the splenic flexure with Harmonic scalpel.  I ended up placing another trocar on the left mid abdomen to facilitate with this because I ended up coming up and taking down the lateral attachments along the proximal left colon sweeping the colon medially.  We stayed anterior to Gerota's fascia.  At this point, I felt that I achieved enough mobilization of the left colon and the splenic flexure and it came down easily.  I ended up taking the omentum off the transverse colon more proximally toward the hepatic flexure.  At this point, I felt like there was enough mobilization to exteriorize the colon.  After discussion with Dr. Lucia Gaskins, we both felt that we should just stick to the transverse colon resection today.  There was a large length of descending colon and sigmoid colon left in the abdomen, where we did not have to mobilize in order to bring up the distal transverse colon.  I felt doing an extended left colectomy for this patient was not warranted today.  There was enough colonic link between the 2 locations that technically we could do 2 anastomoses; however, the patient  was cold in the operating room despite being on a bear hugger, and I felt it was just safe to do the primary colon cancer today.  A transverse incision was made in the left upper abdomen incorporating one of the left trocar sites.  The subcutaneous tissue was divided and the anterior fascia was divided.  The rectus muscle was split and the posterior fascia was incised and abdominal cavity was entered. Pneumoperitoneum was released.  An abdominal wound protector was placed. I exteriorized the transverse colon, the splenic flexure, and the proximal left colon.  We were able to palpate the mass just around the tattoo.  I made a window in the mesocolon just approximately 5 cm proximal, then divided the colon with the GIA 75 stapler.  I then went  about 5 cm distal to the colonic mass and made another window in the mesocolon just next to the colon and divided the bowel with a GIA 75 stapler with a blue load.  I then took down the mesentery in a serial fashion using LigaSure device.  There were 1 or 2 mesenteric arcades that were tied with a 2-0 silk suture.  This freed our specimen.  The proximal margin was marked with a stitch.  I decided to bring the colon back together in a side-to-side fashion.  There were some redundant epiploic appendages on both the proximal colon and the distal colon, which was taken down and excised with LigaSure device and discarded. The colon was brought side-to-side where there was no tension. Colotomies were made in the proximal colon just distal to the staple line on each aspect of the colon.  A GIA 75 stapler with a blue load was then advanced through each defect and the stapler was fired to create a common channel.  I then closed the common defect with multiple interrupted 2-0 silk sutures.  A 3-0 silk suture was placed in the crotch of the anastomosis.  It was widely patent and appeared quite viable.  The colon was returned to the abdominal cavity.  We  then irrigated the abdomen with 2 L of saline.  The wound protector was removed.  We changed gowns and gloves, suction tubing, Bovie electrocautery and redraped the patient.  The posterior fascia was closed with two #1 PDS sutures.  The anterior fascia was closed with 2 running #1 PDS sutures.  The subcutaneous tissue was irrigated.  We briefly re-insufflated the abdomen and reviewed our closure laparoscopically.  It was air tight.  There was nothing trapped within it.  There were no signs of additional bleeding.  Pneumoperitoneum was released.  The remaining trocars were removed.  All the remaining trocar sites were closed with a 4-0 Monocryl in a subcuticular fashion.  A transverse incision was closed also with a subcuticular 4-0 Monocryl.  A honeycomb dressing was placed over that incision and Dermabond was placed on the trocar sites.  All needle, instrument, and sponge counts were correct x2.  There were no immediate complications.  The patient was slightly cold toward the end of the case and he was left intubated while Anesthesia was trying to warm him up.  I discussed the case specifics with the family.     Leighton Ruff. Redmond Pulling, MD, FACS     EMW/MEDQ  D:  11/10/2015  T:  11/11/2015  Job:  [id number]

## 2015-11-11 NOTE — Progress Notes (Signed)
[facility] PROGRESS NOTE  Ernest Lara [id number] DOB: 1940-11-06 DOA: 11/03/2015   PCP: [name], MD     Assessment/Plan: Principal Problem:   GI bleed Active Problems:   DM (diabetes mellitus), type 2    HTN (hypertension)   PAF (paroxysmal atrial fibrillation), recent DCCV 10/11/11 to SR.   Hyperlipidemia   Complete heart block (HCC)   Acute blood loss anemia   Acute kidney injury superimposed on chronic kidney disease (HCC)   Colon tumor   Iron deficiency anemia   Chronic blood loss anemia   Chronic diastolic heart failure ([place])   75 y.o. male with medical history significant of HTN, HLD, PAF on xarelto, Systolic CHF EF [id number] in 04/2015, CKD stage III, diabetes mellitus type 2, s/p PM; who presents with approximately 3 weeks of progressively worsening weakness. Upon admission patient was seen to be afebrile with blood pressure initially noted to be as low as 99/53. All other vital signs within normal limits. Lab work revealed a hemoglobin of 4.6. Received a total of 6 units of packed red blood cells. Admitted for workup of his anemia and weakness. Status post EGD and colonoscopy on 5/28. 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  Continue to hold Xarelto, this may not be continued due to his renal function, he may need an alternative anticoagulant prior to discharge , INR supratherapeutic on admission, reversed with FFP General surgery consulted  for further evaluation of possible malignancy in the transverse colon-recommend left colectomy 6/2 Status post transfusion of 6 units of packed red blood cells, repeat CBC 9.1> 8.7>8.5>10.0 Continue Protonix  ,From  a cardiac perspective he should be able to proceed with surgery without further cardiac specific testing per cardiology. Continue Coreg perioperatively CEA normal, CT scan chest abdomen and pelvis does not show any evidence of metastatic disease Would   Request transfer to surgery service as cardiology  already on board and managing patient's cardiac issues,Ernest Croitoru, MD would continue following the patient  ultimately needs another colonoscopy, post-operatively, in about one year with Ernest. Cristina Lara  NPO    Acute blood loss anemia: Patient Presents with weakness. Gives history of every other day Aleve/on Xarelto  .baseline hemoglobin around 10.5-11.   Status post 6 units of packed red blood cells and 2 units of FFP  Hemoglobin stable around 10, monitor postoperatively   Iron deficiency Start Ferrous sulfate supplementation when able   Paroxysmal Atrial fibrillation/ flutter on chronic anticoagulation: Patient currently in sinus rhythm heart rates controlled less than 100. CHADS2VASC=3.with recent DCCV 10/11/11 to SR. No new episodes of Afib on recent device interrogation - Held Xarelto , this may need to be discontinued given GFR of 23 - Continue Coreg    Acute kidney injury on chronic kidney disease stage III: Baseline creatinine noted to be somewhere around 1.4-1.9 upon review of previous admissions. However, creatinine acutely elevated to 2.92 with a BUN of 44 on admission. Suspect acutely worsened secondary to hypovolemic state with acute anemia./ATN/NSAIDs - Repeat BMP in a.m.now cr is 1.76>1.82>1.63>1.47>1.56 - Held ACE inhibitor, restart  prior to discharge   Systolic congestive heart failure last EF 60-65%:  - Strict I&Os and daily weights - Continuous pulse oximetry with nasal cannula oxygen as  needed to keep O2 sats greater than 92% - IV Lasix prn  for signs of fluid overload ongoing blood transfusions    Essential hypertension - Held benazepril and lasix - Continue  Coreg   Diabetes mellitus type 2 with hyperglycemia - Held metformin and Junuvia, hemoglobin A1c 7.3 - Hypoglycemic protocols, SSI    S/p pacemaker: Implantation of a [employer] CRT PPM on 05/02/15 by Ernest Lara for symptomatic bradycardia.  Hyperlipidemia - Continue Crestor   BPH - Continue doxazosin  Insomnia - Benadryl prn   DVT prophylaxsis SCDs  Code Status:  Full code     Family Communication: Discussed in detail with the patient/daughter, all imaging results, lab results explained to the patient   Disposition Plan:  Continue stepdown post surgery    Consultants:  Gastroenterology  Cardiology  Gen. surgery  Procedures: S/p LAPAROSCOPIC LEFT COLECTOMY LAPAROSCOPIC MOBILIZATION OF SPLENIC FLEXURE, 11/10/15, Ernest. Greer Pickerel  Antibiotics: Anti-infectives    Start     Dose/Rate Route Frequency Ordered Stop   11/10/15 1230  cefoTEtan (CEFOTAN) 2 g in dextrose 5 % 50 mL IVPB     2 g 100 mL/hr over 30 Minutes Intravenous To ShortStay Procedural 11/09/15 1131 11/10/15 1330   11/10/15 1211  cefoTEtan in Dextrose 5% (CEFOTAN) 2-2.08 GM-% IVPB    Comments:  Ernest Lara, [name]   : cabinet override      11/10/15 1211 11/11/15 0029   11/09/15 1145  cefoTEtan (CEFOTAN) 2 g in dextrose 5 % 50 mL IVPB  Status:  Discontinued     2 g 100 mL/hr over 30 Minutes Intravenous On call to O.R. 11/09/15 1131 11/09/15 1139         HPI/Subjective: He looks pretty good. Has not been OOB, no flatus. Tolerating pain, with PCA.    Objective: Filed Vitals:   11/10/15 2335 11/11/15 0354 11/11/15 0400 11/11/15 0729  BP:  148/70  149/75  Pulse:  97  88  Temp:  98.2 F (36.8 C)  98.4 F (36.9 C)  TempSrc:  Oral  Oral  Resp: 18 19 18 17   Height:      Weight:      SpO2: 100% 96% 100% 96%    Intake/Output Summary (Last 24 hours) at 11/11/15 1141 Last data filed at 11/11/15 0758  Gross per 24 hour  Intake   3880 ml  Output   1470 ml  Net   2410 ml     General exam:  Appears calm and comfortable  Respiratory system: Clear to auscultation. Respiratory effort normal. Cardiovascular system: S1 & S2 heard, RRR. No JVD, murmurs, rubs, gallops or clicks. No pedal edema. Gastrointestinal system: Abdomen is nondistended, soft and nontender. No organomegaly or masses felt. Normal bowel sounds heard. Central nervous system: Alert and oriented. No focal neurological deficits. Extremities: Symmetric 5 x 5 power. Skin: No rashes, lesions or ulcers Psychiatry: Judgement and insight appear normal. Mood & affect appropriate.     Data Reviewed: I have personally reviewed following labs and imaging studies  Micro Results Recent Results (from the past 240 hour(s))  Surgical pcr screen     Status: Abnormal   Collection Time: 11/10/15  2:23 AM  Result Value Ref Range Status   MRSA, PCR NEGATIVE NEGATIVE Final   Staphylococcus aureus POSITIVE (A) NEGATIVE Final    Comment:        The Xpert SA Assay (FDA approved for NASAL specimens in patients over 75 years of age), is one component of a comprehensive surveillance  program.  Test performance has been validated by [facility] for patients greater than or equal to 30 year old. It is not intended to diagnose infection nor to guide or monitor treatment.     Radiology Reports Dg Chest 2 View  11/03/2015  CLINICAL DATA:  Shortness of breath. EXAM: CHEST  2 VIEW COMPARISON:  May 03, 2015. FINDINGS: The heart size and mediastinal contours are within normal limits. Both lungs are clear. No pneumothorax or pleural effusion is noted. Left-sided pacemaker is unchanged in position. The visualized skeletal structures are unremarkable. IMPRESSION: No active cardiopulmonary disease. Electronically Signed   By: Marijo Conception, M.D.   On: 11/03/2015 19:30   Ct Chest W Contrast  11/08/2015  CLINICAL DATA:  History of colon lesion, cardiomyopathy, chronic kidney disease, diabetes and hypertension EXAM: CT CHEST, ABDOMEN, AND  PELVIS WITH CONTRAST TECHNIQUE: Multidetector CT imaging of the chest, abdomen and pelvis was performed following the standard protocol during bolus administration of intravenous contrast. CONTRAST:  183mL ISOVUE-300 IOPAMIDOL (ISOVUE-300) INJECTION 61% COMPARISON:  Chest x-ray of 11/03/2015 FINDINGS: CT CHEST On lung window images, no evidence of lung metastasis is seen. There are small bilateral pleural effusions present. In addition there are foci of higher attenuation within both lower lobes posteriorly most suspicious for prior aspiration. There are somewhat prominent interstitial markings to the pleura as well which could represent mild changes of interstitial edema. There are degenerative changes throughout the thoracic spine, in the shoulders, and in the sternoclavicular joints. On lung window images, the thyroid gland contains a few small low-attenuation nodules of less than 1 cm in size, of doubtful clinical significance in this age patient. The thoracic aorta opacifies with no significant abnormality noted. The pulmonary arteries also are faintly opacify with no central abnormality evident. The heart is mildly enlarged and there is a small pericardial effusion present. Pacer wires are noted. CT ABDOMEN AND PELVIS The liver enhances with no focal abnormality and no ductal dilatation is seen. No calcified gallstones are noted. The pancreas is normal in size in the pancreatic duct is not dilated. There may be a small lipoma within the third portion of the duodenum. The adrenal glands and the spleen are unremarkable. The stomach is decompressed. There is a small left lower pole renal calculus of approximately 5 mm. No hydronephrosis is seen. On delayed images the pelvocaliceal systems are unremarkable. Small low-attenuation structures are noted bilaterally involving both kidneys most consistent with small cysts but difficult to characterize on this study. Ultrasound may be helpful if further assessment is  warranted clinically. The proximal ureters are normal in caliber. The abdominal aorta is normal in caliber with moderate atherosclerotic change. No adenopathy is seen. The urinary bladder is not optimally distended but no abnormality is seen. The urinary bladder is slightly thick walled, most likely due to a degree of bladder outlet obstruction caused by a significant enlarged prostate measuring 6.5 x 6.2 cm. The colon is largely decompressed and no obvious colonic mass is seen. Areas of contraction i.e. in the distal transverse colon near the splenic flexure of the could conceivably represent a colonic lesion, but this area is poorly distended. It is noted that within the ascending colon near the hepatic flexure of colon there is and apparent lipoma of 17 mm in diameter with attenuation of -31 age U. scattered diverticula present within the rectosigmoid colon. 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 Also there is significant degenerative change  involving the facet joints of the entire lumbar spine. IMPRESSION: 1. Cardiomegaly, small pericardial effusion, small bilateral pleural effusions, and somewhat prominent interstitial markings suggest mild interstitial edema. 2. No evidence of metastatic involvement of the chest, abdomen, or pelvis. 3. The colon is largely decompressed. No obvious mass is seen other than an apparent lipoma within the ascending colon of 17 mm in diameter. 4. There is an area of poor distension of the distal transverse colon near the splenic flexure and a colonic lesion cannot be excluded in that region. 5. 5 mm left lower pole renal calculus without obstruction. Electronically Signed   By: Ivar Drape M.D.   On: 11/08/2015 08:20   Ct Abdomen Pelvis W Contrast  11/08/2015  CLINICAL DATA:  History of colon lesion, cardiomyopathy, chronic kidney disease, diabetes and hypertension EXAM: CT CHEST, ABDOMEN, AND PELVIS WITH  CONTRAST TECHNIQUE: Multidetector CT imaging of the chest, abdomen and pelvis was performed following the standard protocol during bolus administration of intravenous contrast. CONTRAST:  113mL ISOVUE-300 IOPAMIDOL (ISOVUE-300) INJECTION 61% COMPARISON:  Chest x-ray of 11/03/2015 FINDINGS: CT CHEST On lung window images, no evidence of lung metastasis is seen. There are small bilateral pleural effusions present. In addition there are foci of higher attenuation within both lower lobes posteriorly most suspicious for prior aspiration. There are somewhat prominent interstitial markings to the pleura as well which could represent mild changes of interstitial edema. There are degenerative changes throughout the thoracic spine, in the shoulders, and in the sternoclavicular joints. On lung window images, the thyroid gland contains a few small low-attenuation nodules of less than 1 cm in size, of doubtful clinical significance in this age patient. The thoracic aorta opacifies with no significant abnormality noted. The pulmonary arteries also are faintly opacify with no central abnormality evident. The heart is mildly enlarged and there is a small pericardial effusion present. Pacer wires are noted. CT ABDOMEN AND PELVIS The liver enhances with no focal abnormality and no ductal dilatation is seen. No calcified gallstones are noted. The pancreas is normal in size in the pancreatic duct is not dilated. There may be a small lipoma within the third portion of the duodenum. The adrenal glands and the spleen are unremarkable. The stomach is decompressed. There is a small left lower pole renal calculus of approximately 5 mm. No hydronephrosis is seen. On delayed images the pelvocaliceal systems are unremarkable. Small low-attenuation structures are noted bilaterally involving both kidneys most consistent with small cysts but difficult to characterize on this study. Ultrasound may be helpful if further assessment is warranted  clinically. The proximal ureters are normal in caliber. The abdominal aorta is normal in caliber with moderate atherosclerotic change. No adenopathy is seen. The urinary bladder is not optimally distended but no abnormality is seen. The urinary bladder is slightly thick walled, most likely due to a degree of bladder outlet obstruction caused by a significant enlarged prostate measuring 6.5 x 6.2 cm. The colon is largely decompressed and no obvious colonic mass is seen. Areas of contraction i.e. in the distal transverse colon near the splenic flexure of the could conceivably represent a colonic lesion, but this area is poorly distended. It is noted that within the ascending colon near the hepatic flexure of colon there is and apparent lipoma of 17 mm in diameter with attenuation of -8 age U. scattered diverticula present within the rectosigmoid colon. 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 Also there is significant degenerative  change involving the facet joints of the entire lumbar spine. IMPRESSION: 1. Cardiomegaly, small pericardial effusion, small bilateral pleural effusions, and somewhat prominent interstitial markings suggest mild interstitial edema. 2. No evidence of metastatic involvement of the chest, abdomen, or pelvis. 3. The colon is largely decompressed. No obvious mass is seen other than an apparent lipoma within the ascending colon of 17 mm in diameter. 4. There is an area of poor distension of the distal transverse colon near the splenic flexure and a colonic lesion cannot be excluded in that region. 5. 5 mm left lower pole renal calculus without obstruction. Electronically Signed   By: Ivar Drape M.D.   On: 11/08/2015 08:20   Dg Abd Portable 1v  11/05/2015  CLINICAL DATA:  75 year old male with history of colonic neoplasm. EXAM: PORTABLE ABDOMEN - 1 VIEW COMPARISON:  No priors. FINDINGS: Several nondilated gas-filled loops  of small bowel are noted throughout the central abdomen. No pathologic dilatation of small bowel or colon. No gross evidence of pneumoperitoneum on these supine images. Clips are seen projecting over the left side of the epigastric region and overlying the right upper quadrant. IMPRESSION: 1. Nonspecific, nonobstructive bowel gas pattern. 2. No pneumoperitoneum. Electronically Signed   By: Vinnie Langton M.D.   On: 11/05/2015 17:34     CBC  Recent Labs Lab 11/08/15 0445 11/09/15 0250 11/10/15 0312 11/10/15 1823 11/11/15 0453  WBC 5.9 5.0 5.3 5.8 11.1*  HGB 8.5* 8.4* 8.5* 9.3* 10.0*  HCT 28.1* 27.5* 27.7* 30.8* 32.2*  PLT 219 209 220 209 203  MCV 72.1* 72.2* 71.4* 73.7* 73.0*  MCH 21.8* 22.0* 21.9* 22.2* 22.7*  MCHC 30.2 30.5 30.7 30.2 31.1  RDW 23.0* 23.1* 23.1* 22.8* 22.7*    Chemistries   Recent Labs Lab 11/07/15 0340 11/08/15 0445 11/09/15 0250 11/10/15 0312 11/10/15 1823 11/11/15 0453  NA 136 135 137 135 137 137  K 4.2 4.3 4.0 3.8 4.1 4.6  CL 107 106 108 108 111 108  CO2 23 23 22 22  20* 21*  GLUCOSE 142* 114* 116* 148* 168* 131*  BUN 14 11 10 7 8 8   CREATININE 1.76* 1.82* 1.63* 1.47* 1.49* 1.56*  CALCIUM 9.7 10.0 9.7 9.6 9.2 9.7  AST 17 20 17 20   --  20  ALT 13* 13* 16* 17  --  15*  ALKPHOS 57 53 52 48  --  50  BILITOT 1.6* 1.7* 1.2 1.2  --  1.0   ------------------------------------------------------------------------------------------------------------------ estimated creatinine clearance is 57.7 mL/min (by C-G formula based on Cr of 1.56). ------------------------------------------------------------------------------------------------------------------ No results for input(s): HGBA1C in the last 72 hours. ------------------------------------------------------------------------------------------------------------------ No results for input(s): CHOL, HDL, LDLCALC, TRIG, CHOLHDL, LDLDIRECT in the last 72  hours. ------------------------------------------------------------------------------------------------------------------ No results for input(s): TSH, T4TOTAL, T3FREE, THYROIDAB in the last 72 hours.  Invalid input(s): FREET3 ------------------------------------------------------------------------------------------------------------------ No results for input(s): VITAMINB12, FOLATE, FERRITIN, TIBC, IRON, RETICCTPCT in the last 72 hours.  Coagulation profile  Recent Labs Lab 11/04/15 1239 11/05/15 0452  INR 2.07* 1.54*    No results for input(s): DDIMER in the last 72 hours.  Cardiac Enzymes No results for input(s): CKMB, TROPONINI, MYOGLOBIN in the last 168 hours.  Invalid input(s): CK ------------------------------------------------------------------------------------------------------------------ Invalid input(s): POCBNP   CBG:  Recent Labs Lab 11/10/15 1731 11/10/15 2032 11/10/15 2322 11/11/15 0353 11/11/15 0752  GLUCAP 167* 186* 121* 130* 134*       Studies: No results found.    Lab Results  Component Value Date   HGBA1C 7.3* 11/08/2015  HGBA1C 7.8* 05/01/2015   HGBA1C 7.5* 10/08/2011   Lab Results  Component Value Date   LDLCALC 15 10/09/2011   CREATININE 1.56* 11/11/2015       Scheduled Meds: . acetaminophen  1,000 mg Intravenous Q6H  . alvimopan  12 mg Oral BID  . benazepril  40 mg Oral Daily  . carvedilol  12.5 mg Oral q morning - 10a  . doxazosin  8 mg Oral QHS  . heparin subcutaneous  5,000 Units Subcutaneous Q8H  . insulin aspart  0-9 Units Subcutaneous Q4H  . morphine   Intravenous Q4H  . pantoprazole (PROTONIX) IV  40 mg Intravenous Q12H   Continuous Infusions: . 0.45 % NaCl with KCl 20 mEq / L 100 mL/hr at 11/10/15 2103     LOS: 8 days    Time spent: >30 MINS    [facility]  [facility] Pager [phone]. If 7PM-7AM, please contact night-coverage at [email or web address], password [facility] 11/11/2015, 11:41 AM  LOS: 8 days

## 2015-11-12 DIAGNOSIS — C189 Malignant neoplasm of colon, unspecified: Secondary | ICD-10-CM | POA: Diagnosis present

## 2015-11-12 DIAGNOSIS — E118 Type 2 diabetes mellitus with unspecified complications: Secondary | ICD-10-CM

## 2015-11-12 DIAGNOSIS — K625 Hemorrhage of anus and rectum: Secondary | ICD-10-CM

## 2015-11-12 DIAGNOSIS — D649 Anemia, unspecified: Secondary | ICD-10-CM | POA: Diagnosis present

## 2015-11-12 DIAGNOSIS — IMO0002 Reserved for concepts with insufficient information to code with codable children: Secondary | ICD-10-CM | POA: Diagnosis present

## 2015-11-12 DIAGNOSIS — I5032 Chronic diastolic (congestive) heart failure: Secondary | ICD-10-CM | POA: Diagnosis present

## 2015-11-12 DIAGNOSIS — E1165 Type 2 diabetes mellitus with hyperglycemia: Secondary | ICD-10-CM

## 2015-11-12 DIAGNOSIS — K6389 Other specified diseases of intestine: Secondary | ICD-10-CM

## 2015-11-12 LAB — URINALYSIS, ROUTINE W REFLEX MICROSCOPIC
BILIRUBIN URINE: NEGATIVE
Glucose, UA: NEGATIVE mg/dL
Ketones, ur: NEGATIVE mg/dL
Leukocytes, UA: NEGATIVE
NITRITE: NEGATIVE
PROTEIN: NEGATIVE mg/dL
Specific Gravity, Urine: 1.013 (ref 1.005–1.030)
pH: 5 (ref 5.0–8.0)

## 2015-11-12 LAB — COMPREHENSIVE METABOLIC PANEL
ALBUMIN: 2.6 g/dL — AB (ref 3.5–5.0)
ALK PHOS: 61 U/L (ref 38–126)
ALT: 17 U/L (ref 17–63)
AST: 22 U/L (ref 15–41)
Anion gap: 8 (ref 5–15)
BILIRUBIN TOTAL: 1.5 mg/dL — AB (ref 0.3–1.2)
BUN: 9 mg/dL (ref 6–20)
CALCIUM: 10.1 mg/dL (ref 8.9–10.3)
CO2: 20 mmol/L — ABNORMAL LOW (ref 22–32)
CREATININE: 1.75 mg/dL — AB (ref 0.61–1.24)
Chloride: 108 mmol/L (ref 101–111)
GFR calc Af Amer: 42 mL/min — ABNORMAL LOW (ref >60)
GFR, EST NON AFRICAN AMERICAN: 37 mL/min — AB (ref >60)
GLUCOSE: 154 mg/dL — AB (ref 65–99)
POTASSIUM: 4.2 mmol/L (ref 3.5–5.1)
Sodium: 136 mmol/L (ref 135–145)
TOTAL PROTEIN: 6.5 g/dL (ref 6.5–8.1)

## 2015-11-12 LAB — GLUCOSE, CAPILLARY
GLUCOSE-CAPILLARY: 129 mg/dL — AB (ref 65–99)
GLUCOSE-CAPILLARY: 137 mg/dL — AB (ref 65–99)
GLUCOSE-CAPILLARY: 157 mg/dL — AB (ref 65–99)
GLUCOSE-CAPILLARY: 159 mg/dL — AB (ref 65–99)
GLUCOSE-CAPILLARY: 178 mg/dL — AB (ref 65–99)
Glucose-Capillary: 139 mg/dL — ABNORMAL HIGH (ref 65–99)

## 2015-11-12 LAB — URINE MICROSCOPIC-ADD ON

## 2015-11-12 LAB — CBC
HEMATOCRIT: 32.5 % — AB (ref 39.0–52.0)
HEMOGLOBIN: 10.2 g/dL — AB (ref 13.0–17.0)
MCH: 22.7 pg — ABNORMAL LOW (ref 26.0–34.0)
MCHC: 31.4 g/dL (ref 30.0–36.0)
MCV: 72.2 fL — AB (ref 78.0–100.0)
Platelets: 216 10*3/uL (ref 150–400)
RBC: 4.5 MIL/uL (ref 4.22–5.81)
RDW: 23.3 % — AB (ref 11.5–15.5)
WBC: 13.5 10*3/uL — AB (ref 4.0–10.5)

## 2015-11-12 MED ORDER — HYDROCODONE-ACETAMINOPHEN 7.5-325 MG/15ML PO SOLN
10.0000 mL | ORAL | Status: DC | PRN
  Filled 2015-11-12: qty 15

## 2015-11-12 MED ORDER — ROSUVASTATIN CALCIUM 20 MG PO TABS
20.0000 mg | ORAL_TABLET | Freq: Every day | ORAL | Status: DC
  Administered 2015-11-13 – 2015-11-15 (×3): 20 mg via ORAL
  Filled 2015-11-12 (×3): qty 1

## 2015-11-12 NOTE — Progress Notes (Signed)
PROGRESS NOTE    Ernest Lara  [id number] DOB: [birth year] DOA: 11/03/2015 PCP: [name], MD   Brief Narrative:  75 y.o.BM PMHx HTN, HLD, PAF on xarelto, Chronic Systolic CHF EF [id number] in 04/2015, S/P pacemaker, CKD stage III, DM Type 2 uncontrolled with complication,;   who presents with approximately 3 weeks of progressively worsening weakness. Upon admission patient was seen to be afebrile with blood pressure initially noted to be as low as 99/53. All other vital signs within normal limits. Lab work revealed a hemoglobin of 4.6. Received a total of 6 units of packed red blood cells. Admitted for workup of his anemia and weakness. Status post EGD and colonoscopy on 5/28. Left hemicolectomy on 6/2   Assessment & Plan:   Principal Problem:   GI bleed Active Problems:   DM (diabetes mellitus), type 2    HTN (hypertension)   PAF (paroxysmal atrial fibrillation), recent DCCV 10/11/11 to SR.   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[place])   Uncontrolled type 2 diabetes mellitus with complication (HCC)    Acute GI bleed/Adenocarcinoma Colon:  -6/2 S/p LAPAROSCOPIC LEFT COLECTOMY LAPAROSCOPIC MOBILIZATION OF SPLENIC FLEXURE,  -EGD showed duodenal erosions without bleeding -Colonoscopy showed likely malignant tumor in the transverse colon, multiple polyps; see results below -CEA normal, - CT scan chest abdomen and pelvis; negative metastatic disease see report below -6/4 clear liquids -Ambulate q shift  Acute blood loss anemia: (Baseline hemoglobin~10-11)  - S/P transfusion  6 units PRBC + 2 units of FFP -monitor postoperatively  Recent Labs Lab  11/09/15 0250 11/10/15 0312 11/10/15 1823 11/11/15 0453 11/12/15 0403  HGB 8.4* 8.5* 9.3* 10.0* 10.2*  -Trending up  Iron deficiency -Start Ferrous sulfate supplementation when able   Paroxysmal Atrial fibrillation/ flutter on chronic anticoagulation (CHADS2VASC=3)   -Currently in NSR  -Rate controlled Patient currently in sinus rhythm heart rates controlled less than 100. . -10/11/2011 S/P  DCCV 10/11/11 to SR. No new episodes of Afib on recent device interrogation - Hold Xarelto , this may need to be discontinued given GFR of 23 - Coreg 12.5 mg daily  Chronic Diastolic CHF  - Strict I&Os since admission +9.5 L -Daily weights Filed Weights   11/08/15 0454 11/09/15 0451 11/10/15 0455  Weight: 116.983 kg (257 lb 14.4 oz) 117.754 kg (259 lb 9.6 oz) 118.797 kg (261 lb 14.4 oz)  - Titrate O2 to maintain SPO2 > 92% -Transfuse for hemoglobin<8  Essential hypertension - Held benazepril and lasix - Continue Coreg   Pacemaker: - Implantation of a [employer] CRT PPM on 05/02/15 by [name] for symptomatic bradycardia.  Acute kidney injury on chronic kidney disease stage III (Baseline Cr~1.4-1.9)  -Suspect acutely worsened secondary to hypovolemic state with acute anemia./ATN/NSAIDs -  Held ACE inhibitor, restart prior to discharge Lab Results  Component Value Date   CREATININE 1.75* 11/12/2015   CREATININE 1.56* 11/11/2015   CREATININE 1.49* 11/10/2015     Diabetes mellitus type 2 uncontrolled with complications  -[id number] hemoglobin A1c= 7.3 - Held metformin and Junuvia -Sensitive SSI  Hyperlipidemia - Crestor 20 mg daily -Lipid panel pending  BPH - Continue doxazosin  Insomnia - Benadryl prn    DVT prophylaxis: SCD Code Status: Full Family Communication: None Disposition Plan: Per GI/surgery   Consultants:  Dr Ronald Lobo. GI Dr.[name] cardiology Dr.Eric Wilson General Surgery   Procedures/Significant Events:  05/02/2015  echocardiogram;LVEF= 60% to 65%. -- Left atrium:  moderately dilated. - Right atrium: moderately dilated. - Pulmonary arteries: -PA peak pressure: 34 mm Hg (S). 5/28 EGD;-One non-bleeding duodenal ulcer with no stigmata  of bleeding. - Duodenal erosions without bleeding. 5/28 colonoscopy;-Likely malignant tumor transverse colon. Biopsied. - Three 8 to 14 mm polyps transverse colon,at the hepatic flexure and in the proximal  ascending colon, - One 11 mm polyp at the hepatic flexure, - Seven 5 to 10 mm polyps transverse colon/ hepatic flexure - One 40 mm polyp at the recto-sigmoid colon.  - Diverticulosis in the entire examined colon. 5/28 Colonoscopy Pathology Report; Transverse Colon mass biopsy demonstrate malignant tumor consistent with adenocarcinoma. 5/31 CT abdomen pelvis W contrast; negative metastatic disease-small bilateral pleural effusions 6/2Transverse COLON CANCER LAROSCOPIC LEFT COLECTOMY LAPAROSCOPIC MOBILIZATION OF SPLENIC FLEXURE  ?? S/P transfuse 6 units PRBC+ 2 units of FFP this stay.    Cultures   Antimicrobials:    Devices PCA pump   LINES / TUBES:      Continuous Infusions: . 0.45 % NaCl with KCl 20 mEq / L 75 mL/hr at 11/12/15 1838     Subjective: 6/4 A/O 4, sitting in chair comfortably. States he believes he would be ready to ambulate in the A.m.     Objective: Filed Vitals:   11/12/15 1200 11/12/15 1250 11/12/15 1840 11/12/15 1922  BP:  148/74 148/65 126/67  Pulse:  88 86 82  Temp: 99 F (37.2 C)  98.8 F (37.1 C) 99.3 F (37.4 C)  TempSrc: Oral  Oral Oral  Resp:  16 21 22   Height:      Weight:      SpO2:  100% 99% 98%    Intake/Output Summary (Last 24 hours) at 11/12/15 1937 Last data filed at 11/12/15 1930  Gross per 24 hour  Intake    700 ml  Output   3300 ml  Net  -2600 ml   Filed Weights   11/08/15 0454 11/09/15 0451 11/10/15 0455  Weight: 116.983 kg (257 lb 14.4 oz) 117.754 kg (259 lb 9.6 oz) 118.797 kg (261 lb 14.4  oz)    Examination:  General: A/O 4, NAD, sitting in chair comfortably, No acute respiratory distress Eyes: negative scleral hemorrhage, negative anisocoria, negative icterus ENT: Negative Runny nose, negative gingival bleeding, Neck:  Negative scars, masses, torticollis, lymphadenopathy, JVD Lungs: Clear to auscultation bilaterally without wheezes or crackles Cardiovascular: Regular rate and rhythm without murmur gallop or rub normal S1 and S2 Abdomen: negative abdominal pain, nondistended, positive hypoactive, bowel sounds, no rebound, no ascites, no appreciable mass Extremities: No significant cyanosis, clubbing, or edema bilateral lower extremities Skin: Negative rashes, lesions, ulcers Psychiatric:  Negative depression, negative anxiety, negative fatigue, negative mania  Central nervous system:  Cranial nerves II through XII intact, tongue/uvula midline, all extremities muscle strength 5/5, sensation intact throughout, negative dysarthria, negative expressive aphasia, negative receptive aphasia.  .     Data Reviewed: Care during the described time interval was provided by me .  I have reviewed this patient's available data, including medical history, events of note, physical examination, and all test results as part of my evaluation. I have personally reviewed and interpreted all radiology studies.  CBC:  Recent Labs Lab 11/09/15  PB:[id number] 11/10/15 0312 11/10/15 1823 11/11/15 0453 11/12/15 0403  WBC 5.0 5.3 5.8 11.1* 13.5*  HGB 8.4* 8.5* 9.3* 10.0* 10.2*  HCT 27.5* 27.7* 30.8* 32.2* 32.5*  MCV 72.2* 71.4* 73.7* 73.0* 72.2*  PLT 209 220 209 203 [id number]   Basic Metabolic Panel:  Recent Labs Lab 11/09/15 0250 11/10/15 0312 11/10/15 1823 11/11/15 0453 11/12/15 0403  NA 137 135 137 137 136  K 4.0 3.8 4.1 4.6 4.2  CL 108 108 111 108 108  CO2 22 22 20* 21* 20*  GLUCOSE 116* 148* 168* 131* 154*  BUN 10 7 8 8 9   CREATININE 1.63* 1.47* 1.49* 1.56* 1.75*  CALCIUM 9.7 9.6 9.2  9.7 10.1   GFR: Estimated Creatinine Clearance: 51.4 mL/min (by C-G formula based on Cr of 1.75). Liver Function Tests:  Recent Labs Lab 11/08/15 0445 11/09/15 0250 11/10/15 0312 11/11/15 0453 11/12/15 0403  AST 20 17 20 20 22   ALT 13* 16* 17 15* 17  ALKPHOS 53 52 48 50 61  BILITOT 1.7* 1.2 1.2 1.0 1.5*  PROT 6.2* 5.7* 5.8* 5.6* 6.5  ALBUMIN 2.8* 2.6* 2.6* 2.5* 2.6*   No results for input(s): LIPASE, AMYLASE in the last 168 hours. No results for input(s): AMMONIA in the last 168 hours. Coagulation Profile: No results for input(s): INR, PROTIME in the last 168 hours. Cardiac Enzymes: No results for input(s): CKTOTAL, CKMB, CKMBINDEX, TROPONINI in the last 168 hours. BNP (last 3 results) No results for input(s): PROBNP in the last 8760 hours. HbA1C: No results for input(s): HGBA1C in the last 72 hours. CBG:  Recent Labs Lab 11/12/15 0035 11/12/15 0455 11/12/15 0825 11/12/15 1249 11/12/15 1645  GLUCAP 157* 159* 137* 139* 129*   Lipid Profile: No results for input(s): CHOL, HDL, LDLCALC, TRIG, CHOLHDL, LDLDIRECT in the last 72 hours. Thyroid Function Tests: No results for input(s): TSH, T4TOTAL, FREET4, T3FREE, THYROIDAB in the last 72 hours. Anemia Panel: No results for input(s): VITAMINB12, FOLATE, FERRITIN, TIBC, IRON, RETICCTPCT in the last 72 hours. Urine analysis:    Component Value Date/Time   COLORURINE YELLOW 11/12/2015 1153   APPEARANCEUR HAZY* 11/12/2015 1153   LABSPEC 1.013 11/12/2015 1153   PHURINE 5.0 11/12/2015 1153   GLUCOSEU NEGATIVE 11/12/2015 1153   HGBUR LARGE* 11/12/2015 1153   [place] 11/12/2015 1153   KETONESUR NEGATIVE 11/12/2015 1153   PROTEINUR NEGATIVE 11/12/2015 1153   NITRITE NEGATIVE 11/12/2015 1153   LEUKOCYTESUR NEGATIVE 11/12/2015 1153   Sepsis Labs: @LABRCNTIP (procalcitonin:4,lacticidven:4)  ) Recent Results (from the past 240 hour(s))  Surgical pcr screen     Status: Abnormal   Collection Time: 11/10/15   2:23 AM  Result Value Ref Range Status   MRSA, PCR NEGATIVE NEGATIVE Final   Staphylococcus aureus POSITIVE (A) NEGATIVE Final    Comment:        The Xpert SA Assay (FDA approved for NASAL specimens in patients over 47 years of age), is one component of a comprehensive surveillance program.  Test performance has been validated by [facility] for patients greater than or equal to 57 year old. It is not intended to diagnose infection nor to guide or monitor treatment.          Radiology Studies: No results found.      Scheduled Meds: . alvimopan  12 mg Oral BID  . carvedilol  12.5 mg Oral q morning - 10a  . doxazosin  8 mg Oral QHS  . heparin subcutaneous  5,000 Units Subcutaneous Q8H  . insulin  aspart  0-9 Units Subcutaneous Q4H  . morphine   Intravenous Q4H  . pantoprazole (PROTONIX) IV  40 mg Intravenous Q12H  . [START ON 11/13/2015] rosuvastatin  20 mg Oral Daily   Continuous Infusions: . 0.45 % NaCl with KCl 20 mEq / L 75 mL/hr at 11/12/15 1838     LOS: 9 days    Time spent: 40 minutes    Adell Panek, Geraldo Docker, MD [facility] Pager [phone]   If 7PM-7AM, please contact night-coverage [email or web address] Password [facility] 11/12/2015, 7:37 PM

## 2015-11-12 NOTE — Progress Notes (Signed)
2 Days Post-Op  Subjective: He reports a lot of difficulty voiding, being uncomfortable Passing flatus Denies nausea  Objective: Vital signs in last 24 hours: Temp:  [98.6 F (37 C)-100.3 F (37.9 C)] 98.6 F (37 C) (06/04 0700) Pulse Rate:  [78-115] 115 (06/04 0521) Resp:  [18-29] 22 (06/04 0800) BP: (99-171)/(52-82) 152/61 mmHg (06/04 0521) SpO2:  [9 %-99 %] 98 % (06/04 0800) Last BM Date: 11/09/15  Intake/Output from previous day: 06/03 0701 - 06/04 0700 In: 2460 [P.O.:360; I.V.:2000; IV Piggyback:100] Out: 1375 [Urine:1375] Intake/Output this shift:   Abdomen obese, soft, minimally tender  Lab Results:   Recent Labs  11/11/15 0453 11/12/15 0403  WBC 11.1* 13.5*  HGB 10.0* 10.2*  HCT 32.2* 32.5*  PLT 203 216   BMET  Recent Labs  11/11/15 0453 11/12/15 0403  NA 137 136  K 4.6 4.2  CL 108 108  CO2 21* 20*  GLUCOSE 131* 154*  BUN 8 9  CREATININE 1.56* 1.75*  CALCIUM 9.7 10.1   PT/INR No results for input(s): LABPROT, INR in the last 72 hours. ABG No results for input(s): PHART, HCO3 in the last 72 hours.  Invalid input(s): PCO2, PO2  Studies/Results: No results found.  Anti-infectives: Anti-infectives    Start     Dose/Rate Route Frequency Ordered Stop   11/10/15 1230  cefoTEtan (CEFOTAN) 2 g in dextrose 5 % 50 mL IVPB     2 g 100 mL/hr over 30 Minutes Intravenous To ShortStay Procedural 11/09/15 1131 11/10/15 1330   11/10/15 1211  cefoTEtan in Dextrose 5% (CEFOTAN) 2-2.08 GM-% IVPB    Comments:  Vira Agar, [name]   : cabinet override      11/10/15 1211 11/11/15 0029   11/09/15 1145  cefoTEtan (CEFOTAN) 2 g in dextrose 5 % 50 mL IVPB  Status:  Discontinued     2 g 100 mL/hr over 30 Minutes Intravenous On call to O.R. 11/09/15 1131 11/09/15 1139      Assessment/Plan: s/p Procedure(s): LAPAROSCOPIC LEFT COLECTOMY LAPAROSCOPIC MOBILIZATION OF SPLENIC FLEXURE (N/A)  Replace foley at patient request Send u/a Start clear liquids  LOS: 9 days     Ernest Lara A 11/12/2015

## 2015-11-12 NOTE — Progress Notes (Signed)
Patient Name: Ernest Lara Date of Encounter: 11/12/2015  Principal Problem:   GI bleed Active Problems:   DM (diabetes mellitus), type 2    HTN (hypertension)   PAF (paroxysmal atrial fibrillation), recent DCCV 10/11/11 to SR.   Hyperlipidemia   Complete heart block (HCC)   Acute blood loss anemia   Acute kidney injury superimposed on chronic kidney disease (HCC)   Colon tumor   Iron deficiency anemia   Chronic blood loss anemia   Chronic diastolic heart failure ([place])   Length of Stay: 9  SUBJECTIVE  Pain well controlled, but has urinary urgency with minimal actual output. Passed flatus and small BM. No dyspnea lying flat. Lots of PVCs, but no sustained arrhythmia.  CURRENT MEDS . alvimopan  12 mg Oral BID  . benazepril  40 mg Oral Daily  . carvedilol  12.5 mg Oral q morning - 10a  . doxazosin  8 mg Oral QHS  . heparin subcutaneous  5,000 Units Subcutaneous Q8H  . insulin aspart  0-9 Units Subcutaneous Q4H  . morphine   Intravenous Q4H  . pantoprazole (PROTONIX) IV  40 mg Intravenous Q12H    OBJECTIVE   Intake/Output Summary (Last 24 hours) at 11/12/15 1043 Last data filed at 11/12/15 0200  Gross per 24 hour  Intake   2460 ml  Output   1075 ml  Net   1385 ml   Filed Weights   11/08/15 0454 11/09/15 0451 11/10/15 0455  Weight: 116.983 kg (257 lb 14.4 oz) 117.754 kg (259 lb 9.6 oz) 118.797 kg (261 lb 14.4 oz)    PHYSICAL EXAM Filed Vitals:   11/12/15 0400 11/12/15 0521 11/12/15 0700 11/12/15 0800  BP:  152/61    Pulse:  115    Temp:  98.9 F (37.2 C) 98.6 F (37 C)   TempSrc:  Oral Oral   Resp: 22 29  22   Height:      Weight:      SpO2: 95% 97%  98%   General: Alert, oriented x3, no distress Head: no evidence of trauma, PERRL, EOMI, no exophtalmos or lid lag, no myxedema, no xanthelasma; normal ears, nose and oropharynx Neck: normal jugular venous pulsations and no hepatojugular reflux; brisk carotid pulses without delay and no carotid  bruits Chest: clear to auscultation, no signs of consolidation by percussion or palpation, normal fremitus, symmetrical and full respiratory excursions Cardiovascular: normal position and quality of the apical impulse, regular rhythm with frequent ectopy, normal first and split second heart sounds, no rubs or gallops, no murmur Abdomen: mild distention, no masses by palpation, no abnormal pulsatility or arterial bruits, present bowel sounds in 4 quadrants Extremities: no clubbing, cyanosis or edema; 2+ radial, ulnar and brachial pulses bilaterally; 2+ right femoral, posterior tibial and dorsalis pedis pulses; 2+ left femoral, posterior tibial and dorsalis pedis pulses; no subclavian or femoral bruits Neurological: grossly nonfocal  LABS  CBC  Recent Labs  11/11/15 0453 11/12/15 0403  WBC 11.1* 13.5*  HGB 10.0* 10.2*  HCT 32.2* 32.5*  MCV 73.0* 72.2*  PLT 203 [id number]   Basic Metabolic Panel  Recent Labs  11/11/15 0453 11/12/15 0403  NA 137 136  K 4.6 4.2  CL 108 108  CO2 21* 20*  GLUCOSE 131* 154*  BUN 8 9  CREATININE 1.56* 1.75*  CALCIUM 9.7 10.1   Liver Function Tests  Recent Labs  11/11/15 0453 11/12/15 0403  AST 20 22  ALT 15* 17  ALKPHOS 50 61  BILITOT 1.0  1.5*  PROT 5.6* 6.5  ALBUMIN 2.5* 2.6*   Radiology Studies Imaging results have been reviewed and No results found.  TELE A sensed (sinus) V paced with frequent PVCs    ASSESSMENT AND PLAN   1. GI bleed/Colon cancer: s/p hemicolectomy. Would keep off anticoagulation for a total of 4-6 weeks to allow for healing of peptic ulcers and surgical site.   2. HTN (hypertension) Fair control on current rx. Carvedilol should not be stopped periop to avoid rebound arrhythmia/HTN/CHF. May need IV metoprolol if intestinal transit fails to resume promptly after surgery.  3. PAF: with recent DCCV 10/11/11 to SR. No new episodes of Afib on recent device interrogation.  - Xarelto stopped due to anemia and presumed  GI bleed from ulcer and polyps. Continue coreg  4. HLD: continue statin  5. Complete heart block - s/p [employer] CRT-P device. Normal pacemaker function, he is not pacemaker dependent (had normal AV conduction at time of device check), but has committed BiV pacing 100%. No recent atrial fibrillation.   6. Acute kidney injury superimposed on chronic kidney disease ([place]) Improved, back on ACEi, but creatinine starting to worsen again. Will hold temporarily.  7. Urinary retention - check bladder scan to confirm, may need urinary catheter. OK for alpha blocker if needed.  Sanda Klein, MD, [facility] [phone] office [phone] pager 11/12/2015 10:43 AM

## 2015-11-13 ENCOUNTER — Encounter (HOSPITAL_COMMUNITY): Payer: Self-pay | Admitting: General Surgery

## 2015-11-13 LAB — GLUCOSE, CAPILLARY
GLUCOSE-CAPILLARY: 168 mg/dL — AB (ref 65–99)
GLUCOSE-CAPILLARY: 145 mg/dL — AB (ref 65–99)
GLUCOSE-CAPILLARY: 149 mg/dL — AB (ref 65–99)
Glucose-Capillary: 130 mg/dL — ABNORMAL HIGH (ref 65–99)
Glucose-Capillary: 142 mg/dL — ABNORMAL HIGH (ref 65–99)
Glucose-Capillary: 202 mg/dL — ABNORMAL HIGH (ref 65–99)
Glucose-Capillary: 206 mg/dL — ABNORMAL HIGH (ref 65–99)

## 2015-11-13 LAB — TYPE AND SCREEN
ABO/RH(D): O POS
ANTIBODY SCREEN: NEGATIVE
Unit division: 0
Unit division: 0
Unit division: 0

## 2015-11-13 LAB — BASIC METABOLIC PANEL
ANION GAP: 5 (ref 5–15)
BUN: 10 mg/dL (ref 6–20)
CALCIUM: 10.1 mg/dL (ref 8.9–10.3)
CO2: 22 mmol/L (ref 22–32)
CREATININE: 1.56 mg/dL — AB (ref 0.61–1.24)
Chloride: 111 mmol/L (ref 101–111)
GFR calc Af Amer: 49 mL/min — ABNORMAL LOW (ref >60)
GFR, EST NON AFRICAN AMERICAN: 42 mL/min — AB (ref >60)
GLUCOSE: 191 mg/dL — AB (ref 65–99)
Potassium: 4.2 mmol/L (ref 3.5–5.1)
Sodium: 138 mmol/L (ref 135–145)

## 2015-11-13 LAB — CBC
HEMATOCRIT: 30.4 % — AB (ref 39.0–52.0)
Hemoglobin: 9.2 g/dL — ABNORMAL LOW (ref 13.0–17.0)
MCH: 22.1 pg — ABNORMAL LOW (ref 26.0–34.0)
MCHC: 30.3 g/dL (ref 30.0–36.0)
MCV: 72.9 fL — AB (ref 78.0–100.0)
PLATELETS: 230 10*3/uL (ref 150–400)
RBC: 4.17 MIL/uL — ABNORMAL LOW (ref 4.22–5.81)
RDW: 23.6 % — AB (ref 11.5–15.5)
WBC: 8.9 10*3/uL (ref 4.0–10.5)

## 2015-11-13 MED ORDER — SODIUM CHLORIDE 0.9 % IV SOLN
510.0000 mg | Freq: Once | INTRAVENOUS | Status: AC
  Administered 2015-11-13: 510 mg via INTRAVENOUS
  Filled 2015-11-13: qty 17

## 2015-11-13 MED ORDER — PANTOPRAZOLE SODIUM 40 MG PO TBEC
40.0000 mg | DELAYED_RELEASE_TABLET | Freq: Two times a day (BID) | ORAL | Status: DC
  Administered 2015-11-13 – 2015-11-15 (×4): 40 mg via ORAL
  Filled 2015-11-13 (×4): qty 1

## 2015-11-13 MED ORDER — ALBUTEROL SULFATE (2.5 MG/3ML) 0.083% IN NEBU: 2.5000 mg | INHALATION_SOLUTION | RESPIRATORY_TRACT | Status: DC | PRN

## 2015-11-13 NOTE — Progress Notes (Signed)
Patient refuses  to use PCA morphine x 2 days denies pain. MD notified to d/c PCA Morphine 21 mg wasted with Willene Hatchet RN witness waste into sink. I explained to patient he does have oral pain medicine if needed to call me anytime and he continues to deny pain. I will continue to monitor.

## 2015-11-13 NOTE — Progress Notes (Signed)
[place] TEAM 1 - Stepdown/ICU TEAM  Ernest Lara  [id number] DOB: 1940-07-17 DOA: 11/03/2015 PCP: [name], MD    Brief Narrative:  75 y.o. M w/ a Hx of HTN, HLD, PAF on xarelto, Chronic Systolic CHF EF [id number] in 04/2015, S/P pacemaker, CKD stage III, and DM2 who presented with 3 weeks of progressively worsening weakness. Lab work revealed a hemoglobin of 4.6.   Since his admission he has received a total of 6 units of packed red blood cells. Status post EGD and colonoscopy on 5/28. Left hemicolectomy on 6/2.  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  -EGD showed duodenal erosions without bleeding -Colonoscopy showed likely malignant tumor in the transverse colon, multiple polyps -CEA normal -CT scan chest abdomen and pelvis; negative metastatic disease -S/p laparoscopic left colectomy - Gen Surgery following and advancing diet    Acute blood loss anemia -S/P 8 units PRBC + 2 units of FFP - cont to follow Hgb   Iron deficiency -load w/ IV Fe    Paroxysmal Atrial fibrillation/ flutter on chronic anticoagulation -CHADS2VASC is 3 - currently in NSR - Cards following   Chronic Diastolic CHF  -no evidence of signif volume overload at this time   [facility] Weights   11/09/15 0451 11/10/15 0455 11/13/15 0448  Weight: 117.754 kg (259 lb 9.6 oz) 118.797 kg (261 lb 14.4 oz) 119.795 kg (264 lb 1.6 oz)   Urinary retention  S/p foley placement - once pt more active, will plan voiding trial - initiate urecholine for short trial  Essential hypertension -follow w/o change today   CHB s/p Pacemaker -Implantation of a [employer] CRT PPM on 05/02/15 by [name] for symptomatic bradycardia  Acute kidney injury on chronic kidney disease stage III baseline Cr~1.4-1.9 - acutely worsened secondary to acute anemia -holding ACE inhibitor - back to baseline range presently   DM2 -5/31 A1c 7.3 - CBG overall well controlled - follow w/o change  today   Hyperlipidemia -Crestor 20 mg daily  BPH -Continue doxazosin  DVT prophylaxis: SCDs Code Status: FULL CODE Family Communication: no family present at time of exam  Disposition Plan: transfer to tele bed - ambulate - diet per Surgery   Consultants:  [facility] Cards Gen Surgery   Procedures: 11/2016TTE EF 60-65%  5/28 EGD - One non-bleeding duodenal ulcer with no stigmata of bleeding - Duodenal erosions without bleeding. 5/28 colonoscopy - tumor transverse colon. Biopsied. - Three 8 to 14 mm polyps transverse colon,at the hepatic flexure and in the proximal ascending colon, - One 11 mm polyp at the hepatic flexure, - Seven 5 to 10 mm polyps transverse colon/ hepatic flexure - One 40 mm polyp at the recto-sigmoid colon.  - Diverticulosis in the entire examined colon. 5/31 CT abdomen pelvis W contrast; negative metastatic disease-small bilateral pleural effusions 6/2 LAROSCOPIC LEFT COLECTOMY LAPAROSCOPIC MOBILIZATION OF SPLENIC FLEXURE   Antimicrobials:  none  Subjective: Sitting up in bedside chair.  Pleasant and comfortable.  Denies cp, n/v, or abdom pain.  Tolerating current diet w/o difficulty and passing some gas.    Objective: Blood pressure 145/68, pulse 76, temperature 98.4 F (36.9 C), temperature source Axillary, resp. rate 16, height 6\' 3"  (1.905 m), weight 119.795 kg (264 lb 1.6 oz), SpO2 100 %.  Intake/Output Summary (Last 24 hours) at 11/13/15 1359 Last data filed at 11/13/15 1206  Gross per 24 hour  Intake   1275 ml  Output   4050 ml  Net  -2775 ml   Filed Weights   11/09/15 0451 11/10/15 0455 11/13/15 0448  Weight: 117.754 kg (259 lb 9.6 oz) 118.797 kg (261 lb 14.4 oz) 119.795 kg (264 lb 1.6 oz)    Examination: General: No acute respiratory distress Lungs: Clear to auscultation bilaterally without wheezes or crackles Cardiovascular: Regular rate and rhythm without murmur gallop or rub normal S1 and S2 Abdomen: Nontender, mildly  protuberent, soft, bowel sounds positive, no rebound, no ascites, no appreciable mass Extremities: No significant cyanosis, clubbing, or edema bilateral lower extremities  CBC:  Recent Labs Lab 11/10/15 0312 11/10/15 1823 11/11/15 0453 11/12/15 0403 11/13/15 1207  WBC 5.3 5.8 11.1* 13.5* 8.9  HGB 8.5* 9.3* 10.0* 10.2* 9.2*  HCT 27.7* 30.8* 32.2* 32.5* 30.4*  MCV 71.4* 73.7* 73.0* 72.2* 72.9*  PLT 220 209 203 216 [id number]   Basic Metabolic Panel:  Recent Labs Lab 11/10/15 0312 11/10/15 1823 11/11/15 0453 11/12/15 0403 11/13/15 1207  NA 135 137 137 136 138  K 3.8 4.1 4.6 4.2 4.2  CL 108 111 108 108 111  CO2 22 20* 21* 20* 22  GLUCOSE 148* 168* 131* 154* 191*  BUN 7 8 8 9 10   CREATININE 1.47* 1.49* 1.56* 1.75* 1.56*  CALCIUM 9.6 9.2 9.7 10.1 10.1   GFR: Estimated Creatinine Clearance: 57.9 mL/min (by C-G formula based on Cr of 1.56).  Liver Function Tests:  Recent Labs Lab 11/08/15 0445 11/09/15 0250 11/10/15 0312 11/11/15 0453 11/12/15 0403  AST 20 17 20 20 22   ALT 13* 16* 17 15* 17  ALKPHOS 53 52 48 50 61  BILITOT 1.7* 1.2 1.2 1.0 1.5*  PROT 6.2* 5.7* 5.8* 5.6* 6.5  ALBUMIN 2.8* 2.6* 2.6* 2.5* 2.6*    HbA1C: HGB A1C MFR BLD  Date/Time Value Ref Range Status  11/08/2015 04:45 AM 7.3* 4.8 - 5.6 % Final    Comment:    (NOTE)         Pre-diabetes: 5.7 - 6.4         Diabetes: >6.4         Glycemic control for adults with diabetes: <7.0   05/01/2015 06:32 PM 7.8* 4.8 - 5.6 % Final    Comment:    (NOTE)         Pre-diabetes: 5.7 - 6.4         Diabetes: >6.4         Glycemic control for adults with diabetes: <7.0     CBG:  Recent Labs Lab 11/12/15 1921 11/13/15 0036 11/13/15 0434 11/13/15 0727 11/13/15 1154  GLUCAP 178* 142* 130* 145* 202*    Recent Results (from the past 240 hour(s))  Surgical pcr screen     Status: Abnormal   Collection Time: 11/10/15  2:23 AM  Result Value Ref Range Status   MRSA, PCR NEGATIVE NEGATIVE Final    Staphylococcus aureus POSITIVE (A) NEGATIVE Final    Comment:        The Xpert SA Assay (FDA approved for NASAL specimens in patients over 66 years of age), is one component of a comprehensive surveillance program.  Test performance has been validated by [facility] for patients greater than or equal to 60 year old. It is not intended to diagnose infection nor to guide or monitor treatment.      Scheduled Meds: . alvimopan  12 mg Oral BID  . carvedilol  12.5 mg Oral q morning - 10a  . doxazosin  8 mg Oral QHS  . heparin  subcutaneous  5,000 Units Subcutaneous Q8H  . insulin aspart  0-9 Units Subcutaneous Q4H  . pantoprazole (PROTONIX) IV  40 mg Intravenous Q12H  . rosuvastatin  20 mg Oral Daily   Continuous Infusions: . 0.45 % NaCl with KCl 20 mEq / L 30 mL/hr at 11/13/15 1048     LOS: 10 days   Time spent: 35 minutes   Cherene Altes, MD [facility] Office  [phone] Pager - Text Page per Amion as per below:  On-Call/Text Page:      [email or web address]      password [email or web address]  If 7PM-7AM, please contact night-coverage [email or web address] Password [email or web address] 11/13/2015, 1:59 PM

## 2015-11-13 NOTE — Progress Notes (Signed)
Patient Name: Ernest Lara Date of Encounter: 11/13/2015     Principal Problem:   GI bleed Active Problems:   DM (diabetes mellitus), type 2    HTN (hypertension)   PAF (paroxysmal atrial fibrillation), recent DCCV 10/11/11 to SR.   Hyperlipidemia   Complete heart block (HCC)   Acute blood loss anemia   Acute kidney injury superimposed on chronic kidney disease (HCC)   Colon tumor   Iron deficiency anemia   Chronic blood loss anemia   Chronic diastolic heart failure (HCC)   Absolute anemia   Chronic diastolic CHF (congestive heart failure) ([place])   Colonic mass   Adenocarcinoma, colon ([place])   Uncontrolled type 2 diabetes mellitus with complication (HCC)    SUBJECTIVE  No complaints. Feeling much better after foley placed.   CURRENT MEDS . alvimopan  12 mg Oral BID  . carvedilol  12.5 mg Oral q morning - 10a  . doxazosin  8 mg Oral QHS  . heparin subcutaneous  5,000 Units Subcutaneous Q8H  . insulin aspart  0-9 Units Subcutaneous Q4H  . pantoprazole (PROTONIX) IV  40 mg Intravenous Q12H  . rosuvastatin  20 mg Oral Daily    OBJECTIVE  Filed Vitals:   11/12/15 2300 11/13/15 0039 11/13/15 0448 11/13/15 0728  BP:  126/65    Pulse:  87    Temp: 99.1 F (37.3 C)  98.3 F (36.8 C) 99.1 F (37.3 C)  TempSrc: Oral  Oral Oral  Resp:  17    Height:      Weight:   264 lb 1.6 oz (119.795 kg)   SpO2:  98%      Intake/Output Summary (Last 24 hours) at 11/13/15 1140 Last data filed at 11/13/15 0600  Gross per 24 hour  Intake   1275 ml  Output   3150 ml  Net  -1875 ml   Filed Weights   11/09/15 0451 11/10/15 0455 11/13/15 0448  Weight: 259 lb 9.6 oz (117.754 kg) 261 lb 14.4 oz (118.797 kg) 264 lb 1.6 oz (119.795 kg)    PHYSICAL EXAM  General: Pleasant, NAD. Neuro: Alert and oriented X 3. Moves all extremities spontaneously. Psych: Normal affect. HEENT:  Normal  Neck: Supple without bruits or JVD. Lungs:  Resp regular and unlabored, CTA. Heart: RRR no s3,  s4, or murmurs. Abdomen: Soft, non-tender, non-distended, BS + x 4.  Extremities: No clubbing, cyanosis or edema. DP/PT/Radials 2+ and equal bilaterally.  Accessory Clinical Findings  CBC  Recent Labs  11/11/15 0453 11/12/15 0403  WBC 11.1* 13.5*  HGB 10.0* 10.2*  HCT 32.2* 32.5*  MCV 73.0* 72.2*  PLT 203 [id number]   Basic Metabolic Panel  Recent Labs  11/11/15 0453 11/12/15 0403  NA 137 136  K 4.6 4.2  CL 108 108  CO2 21* 20*  GLUCOSE 131* 154*  BUN 8 9  CREATININE 1.56* 1.75*  CALCIUM 9.7 10.1   Liver Function Tests  Recent Labs  11/11/15 0453 11/12/15 0403  AST 20 22  ALT 15* 17  ALKPHOS 50 61  BILITOT 1.0 1.5*  PROT 5.6* 6.5  ALBUMIN 2.5* 2.6*    TELE  V paced  Radiology/Studies  Dg Chest 2 View  11/03/2015  CLINICAL DATA:  Shortness of breath. EXAM: CHEST  2 VIEW COMPARISON:  May 03, 2015. FINDINGS: The heart size and mediastinal contours are within normal limits. Both lungs are clear. No pneumothorax or pleural effusion is noted. Left-sided pacemaker is unchanged in position. The visualized  skeletal structures are unremarkable. IMPRESSION: No active cardiopulmonary disease. Electronically Signed   By: Marijo Conception, M.D.   On: 11/03/2015 19:30   Ct Chest W Contrast  11/08/2015  CLINICAL DATA:  History of colon lesion, cardiomyopathy, chronic kidney disease, diabetes and hypertension EXAM: CT CHEST, ABDOMEN, AND PELVIS WITH CONTRAST TECHNIQUE: Multidetector CT imaging of the chest, abdomen and pelvis was performed following the standard protocol during bolus administration of intravenous contrast. CONTRAST:  132mL ISOVUE-300 IOPAMIDOL (ISOVUE-300) INJECTION 61% COMPARISON:  Chest x-ray of 11/03/2015 FINDINGS: CT CHEST On lung window images, no evidence of lung metastasis is seen. There are small bilateral pleural effusions present. In addition there are foci of higher attenuation within both lower lobes posteriorly most suspicious for prior aspiration.  There are somewhat prominent interstitial markings to the pleura as well which could represent mild changes of interstitial edema. There are degenerative changes throughout the thoracic spine, in the shoulders, and in the sternoclavicular joints. On lung window images, the thyroid gland contains a few small low-attenuation nodules of less than 1 cm in size, of doubtful clinical significance in this age patient. The thoracic aorta opacifies with no significant abnormality noted. The pulmonary arteries also are faintly opacify with no central abnormality evident. The heart is mildly enlarged and there is a small pericardial effusion present. Pacer wires are noted. CT ABDOMEN AND PELVIS The liver enhances with no focal abnormality and no ductal dilatation is seen. No calcified gallstones are noted. The pancreas is normal in size in the pancreatic duct is not dilated. There may be a small lipoma within the third portion of the duodenum. The adrenal glands and the spleen are unremarkable. The stomach is decompressed. There is a small left lower pole renal calculus of approximately 5 mm. No hydronephrosis is seen. On delayed images the pelvocaliceal systems are unremarkable. Small low-attenuation structures are noted bilaterally involving both kidneys most consistent with small cysts but difficult to characterize on this study. Ultrasound may be helpful if further assessment is warranted clinically. The proximal ureters are normal in caliber. The abdominal aorta is normal in caliber with moderate atherosclerotic change. No adenopathy is seen. The urinary bladder is not optimally distended but no abnormality is seen. The urinary bladder is slightly thick walled, most likely due to a degree of bladder outlet obstruction caused by a significant enlarged prostate measuring 6.5 x 6.2 cm. The colon is largely decompressed and no obvious colonic mass is seen. Areas of contraction i.e. in the distal transverse colon near the  splenic flexure of the could conceivably represent a colonic lesion, but this area is poorly distended. It is noted that within the ascending colon near the hepatic flexure of colon there is and apparent lipoma of 17 mm in diameter with attenuation of -75 age U. scattered diverticula present within the rectosigmoid colon. 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 Also there is significant degenerative change involving the facet joints of the entire lumbar spine. IMPRESSION: 1. Cardiomegaly, small pericardial effusion, small bilateral pleural effusions, and somewhat prominent interstitial markings suggest mild interstitial edema. 2. No evidence of metastatic involvement of the chest, abdomen, or pelvis. 3. The colon is largely decompressed. No obvious mass is seen other than an apparent lipoma within the ascending colon of 17 mm in diameter. 4. There is an area of poor distension of the distal transverse colon near the splenic flexure and a colonic lesion cannot be excluded in that region.  5. 5 mm left lower pole renal calculus without obstruction. Electronically Signed   By: Ivar Drape M.D.   On: 11/08/2015 08:20   Ct Abdomen Pelvis W Contrast  11/08/2015  CLINICAL DATA:  History of colon lesion, cardiomyopathy, chronic kidney disease, diabetes and hypertension EXAM: CT CHEST, ABDOMEN, AND PELVIS WITH CONTRAST TECHNIQUE: Multidetector CT imaging of the chest, abdomen and pelvis was performed following the standard protocol during bolus administration of intravenous contrast. CONTRAST:  183mL ISOVUE-300 IOPAMIDOL (ISOVUE-300) INJECTION 61% COMPARISON:  Chest x-ray of 11/03/2015 FINDINGS: CT CHEST On lung window images, no evidence of lung metastasis is seen. There are small bilateral pleural effusions present. In addition there are foci of higher attenuation within both lower lobes posteriorly most suspicious for prior aspiration. There are  somewhat prominent interstitial markings to the pleura as well which could represent mild changes of interstitial edema. There are degenerative changes throughout the thoracic spine, in the shoulders, and in the sternoclavicular joints. On lung window images, the thyroid gland contains a few small low-attenuation nodules of less than 1 cm in size, of doubtful clinical significance in this age patient. The thoracic aorta opacifies with no significant abnormality noted. The pulmonary arteries also are faintly opacify with no central abnormality evident. The heart is mildly enlarged and there is a small pericardial effusion present. Pacer wires are noted. CT ABDOMEN AND PELVIS The liver enhances with no focal abnormality and no ductal dilatation is seen. No calcified gallstones are noted. The pancreas is normal in size in the pancreatic duct is not dilated. There may be a small lipoma within the third portion of the duodenum. The adrenal glands and the spleen are unremarkable. The stomach is decompressed. There is a small left lower pole renal calculus of approximately 5 mm. No hydronephrosis is seen. On delayed images the pelvocaliceal systems are unremarkable. Small low-attenuation structures are noted bilaterally involving both kidneys most consistent with small cysts but difficult to characterize on this study. Ultrasound may be helpful if further assessment is warranted clinically. The proximal ureters are normal in caliber. The abdominal aorta is normal in caliber with moderate atherosclerotic change. No adenopathy is seen. The urinary bladder is not optimally distended but no abnormality is seen. The urinary bladder is slightly thick walled, most likely due to a degree of bladder outlet obstruction caused by a significant enlarged prostate measuring 6.5 x 6.2 cm. The colon is largely decompressed and no obvious colonic mass is seen. Areas of contraction i.e. in the distal transverse colon near the splenic  flexure of the could conceivably represent a colonic lesion, but this area is poorly distended. It is noted that within the ascending colon near the hepatic flexure of colon there is and apparent lipoma of 17 mm in diameter with attenuation of -22 age U. scattered diverticula present within the rectosigmoid colon. 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 Also there is significant degenerative change involving the facet joints of the entire lumbar spine. IMPRESSION: 1. Cardiomegaly, small pericardial effusion, small bilateral pleural effusions, and somewhat prominent interstitial markings suggest mild interstitial edema. 2. No evidence of metastatic involvement of the chest, abdomen, or pelvis. 3. The colon is largely decompressed. No obvious mass is seen other than an apparent lipoma within the ascending colon of 17 mm in diameter. 4. There is an area of poor distension of the distal transverse colon near the splenic flexure and a colonic lesion cannot be excluded in that  region. 5. 5 mm left lower pole renal calculus without obstruction. Electronically Signed   By: Ivar Drape M.D.   On: 11/08/2015 08:20   Dg Abd Portable 1v  11/05/2015  CLINICAL DATA:  75 year old male with history of colonic neoplasm. EXAM: PORTABLE ABDOMEN - 1 VIEW COMPARISON:  No priors. FINDINGS: Several nondilated gas-filled loops of small bowel are noted throughout the central abdomen. No pathologic dilatation of small bowel or colon. No gross evidence of pneumoperitoneum on these supine images. Clips are seen projecting over the left side of the epigastric region and overlying the right upper quadrant. IMPRESSION: 1. Nonspecific, nonobstructive bowel gas pattern. 2. No pneumoperitoneum. Electronically Signed   By: Vinnie Langton M.D.   On: 11/05/2015 17:34    ASSESSMENT AND PLAN  75 yo male w/ hx PAF on Xarelto (CHADS2VASC=3), HTN, DM, CHB s/p BSci CRT-P, CKD  III, remote NICM w/ EF now nl (echo 04/2015) who was admitted on 05/26 w/ GIB, Hgb 4.6 and found to have a malignant colon mass.  GI bleed/Colon cancer: s/p hemicolectomy. Per his primary cardiologist (Dr. Sallyanne Kuster), would keep off anticoagulation for a total of 4-6 weeks to allow for healing of peptic ulcers and surgical site. Then resume home Xarelto   HTN (hypertension) Fair control on current rx. Carvedilol should not be stopped periop to avoid rebound arrhythmia/HTN/CHF. May need IV metoprolol if intestinal transit fails to resume promptly after surgery.  PAF: with recent DCCV 10/11/11 to SR. No new episodes of Afib on recent device interrogation.  - Xarelto stopped due to anemia and presumed GI bleed from ulcer and polyps. Will stay off until cleared to resume after surgery (plan 4-6 weeks) - continue coreg  HLD: continue statin  Complete heart block - s/p [employer] CRT-P device. Normal pacemaker function, he is not pacemaker dependent (had normal AV conduction at time of device check), but has committed BiV pacing 100%. No recent atrial fibrillation.   Acute kidney injury superimposed on chronic kidney disease ([place]) - initial BUN/CR 44/2.92. Improved and placed back on ACEi, but creatinine started to worsen again and ACE held again. No labs today. Will check BMET.   Urinary retention: now s/p foley for urinary retention. OK for alpha blocker if needed.  Signed, Angelena Form PA-C  Pager [phone]

## 2015-11-13 NOTE — Care Management Important Message (Signed)
Important Message  Patient Details  Name: Ernest Lara MRN: [id number] Date of Birth: [birth year]   Medicare Important Message Given:  Yes    Nathen May 11/13/2015, 12:06 PM

## 2015-11-13 NOTE — Progress Notes (Signed)
3 Days Post-Op  Subjective: He looks good, some flatus, no BM, site OK.  Some rales in his bases.  He had to have foley replaced for retention  Objective: Vital signs in last 24 hours: Temp:  [98.3 F (36.8 C)-99.3 F (37.4 C)] 99.1 F (37.3 C) (06/05 0728) Pulse Rate:  [82-88] 87 (06/05 0039) Resp:  [16-22] 17 (06/05 0039) BP: (126-148)/(65-74) 126/65 mmHg (06/05 0039) SpO2:  [98 %-100 %] 98 % (06/05 0039) Weight:  [119.795 kg (264 lb 1.6 oz)] 119.795 kg (264 lb 1.6 oz) (06/05 0448) Last BM Date: 11/12/15 No PO recorded Urine 4450 Afebrile, VSS No labs; will recheck in the AM   Intake/Output from previous day: 06/04 0701 - 06/05 0700 In: 2175 [I.V.:2175] Out: 4450 [Urine:4450] Intake/Output this shift:    General appearance: alert, cooperative and no distress Resp: some rales each base. GI: soft, sore, sites OK + flatus, no BM  Lab Results:   Recent Labs  11/11/15 0453 11/12/15 0403  WBC 11.1* 13.5*  HGB 10.0* 10.2*  HCT 32.2* 32.5*  PLT 203 216    BMET  Recent Labs  11/11/15 0453 11/12/15 0403  NA 137 136  K 4.6 4.2  CL 108 108  CO2 21* 20*  GLUCOSE 131* 154*  BUN 8 9  CREATININE 1.56* 1.75*  CALCIUM 9.7 10.1   PT/INR No results for input(s): LABPROT, INR in the last 72 hours.   Recent Labs Lab 11/08/15 0445 11/09/15 0250 11/10/15 0312 11/11/15 0453 11/12/15 0403  AST 20 17 20 20 22   ALT 13* 16* 17 15* 17  ALKPHOS 53 52 48 50 61  BILITOT 1.7* 1.2 1.2 1.0 1.5*  PROT 6.2* 5.7* 5.8* 5.6* 6.5  ALBUMIN 2.8* 2.6* 2.6* 2.5* 2.6*     Lipase  No results found for: LIPASE   Studies/Results: No results found.  Medications: . alvimopan  12 mg Oral BID  . carvedilol  12.5 mg Oral q morning - 10a  . doxazosin  8 mg Oral QHS  . heparin subcutaneous  5,000 Units Subcutaneous Q8H  . insulin aspart  0-9 Units Subcutaneous Q4H  . pantoprazole (PROTONIX) IV  40 mg Intravenous Q12H  . rosuvastatin  20 mg Oral Daily   . 0.45 % NaCl with KCl 20  mEq / L 75 mL/hr at 11/13/15 0802   Assessment/Plan Transverse colon cancer (adenocarcinoma),with lagre benign rectosigmoid polyp S/p LAPAROSCOPIC LEFT COLECTOMY LAPAROSCOPIC MOBILIZATION OF SPLENIC FLEXURE, 11/10/15, Dr. Greer Pickerel Multiple colonic polyps Post op urinary retention Hx of duodenal ulcer/GI bleed/BPH Anemia/chronic blood loss PAF; chronic anticoagulation, Xarelto pre admit Hx of Complete heart block with pacer AODM Hypertension Acute on chronic kidney disease creatinine is 1.75 11/11/15 Dyslipidemia  FEN: Clear liquids/IV fluids ID: Currently none Cefotetan pre op DVT: Heparin/SCD   Plan:  I will decrease his IV fluids, he needs to be walking.  V paced.  He is working on IS.  When stable medically he can go to the floor from our standpoint.  I will keep him on clears for now till more bowel function. Recheck labs in AM.    LOS: 10 days    Dabney Dever 11/13/2015 [phone]

## 2015-11-14 LAB — GLUCOSE, CAPILLARY
GLUCOSE-CAPILLARY: 170 mg/dL — AB (ref 65–99)
GLUCOSE-CAPILLARY: 189 mg/dL — AB (ref 65–99)
GLUCOSE-CAPILLARY: 202 mg/dL — AB (ref 65–99)
Glucose-Capillary: 136 mg/dL — ABNORMAL HIGH (ref 65–99)
Glucose-Capillary: 136 mg/dL — ABNORMAL HIGH (ref 65–99)
Glucose-Capillary: 192 mg/dL — ABNORMAL HIGH (ref 65–99)

## 2015-11-14 LAB — COMPREHENSIVE METABOLIC PANEL
ALBUMIN: 2.3 g/dL — AB (ref 3.5–5.0)
ALK PHOS: 53 U/L (ref 38–126)
ALT: 17 U/L (ref 17–63)
ANION GAP: 9 (ref 5–15)
AST: 21 U/L (ref 15–41)
BILIRUBIN TOTAL: 1 mg/dL (ref 0.3–1.2)
BUN: 8 mg/dL (ref 6–20)
CALCIUM: 10.5 mg/dL — AB (ref 8.9–10.3)
CO2: 23 mmol/L (ref 22–32)
CREATININE: 1.45 mg/dL — AB (ref 0.61–1.24)
Chloride: 107 mmol/L (ref 101–111)
GFR calc non Af Amer: 46 mL/min — ABNORMAL LOW (ref >60)
GFR, EST AFRICAN AMERICAN: 53 mL/min — AB (ref >60)
Glucose, Bld: 145 mg/dL — ABNORMAL HIGH (ref 65–99)
POTASSIUM: 4.1 mmol/L (ref 3.5–5.1)
Sodium: 139 mmol/L (ref 135–145)
TOTAL PROTEIN: 5.4 g/dL — AB (ref 6.5–8.1)

## 2015-11-14 LAB — CBC
HEMATOCRIT: 27.9 % — AB (ref 39.0–52.0)
HEMOGLOBIN: 8.6 g/dL — AB (ref 13.0–17.0)
MCH: 22.4 pg — AB (ref 26.0–34.0)
MCHC: 30.8 g/dL (ref 30.0–36.0)
MCV: 72.7 fL — ABNORMAL LOW (ref 78.0–100.0)
Platelets: 247 10*3/uL (ref 150–400)
RBC: 3.84 MIL/uL — ABNORMAL LOW (ref 4.22–5.81)
RDW: 23.9 % — AB (ref 11.5–15.5)
WBC: 6.2 10*3/uL (ref 4.0–10.5)

## 2015-11-14 LAB — BLOOD PRODUCT ORDER (VERBAL) VERIFICATION

## 2015-11-14 MED ORDER — INSULIN ASPART 100 UNIT/ML ~~LOC~~ SOLN
0.0000 [IU] | Freq: Three times a day (TID) | SUBCUTANEOUS | Status: DC
Start: 2015-11-14 — End: 2015-11-15
  Administered 2015-11-14: 1 [IU] via SUBCUTANEOUS
  Administered 2015-11-15 (×2): 2 [IU] via SUBCUTANEOUS

## 2015-11-14 MED ORDER — BACITRACIN-NEOMYCIN-POLYMYXIN OINTMENT TUBE
TOPICAL_OINTMENT | CUTANEOUS | Status: DC | PRN
  Filled 2015-11-14 (×2): qty 15

## 2015-11-14 NOTE — Progress Notes (Signed)
This pt apparently went to the hospital and was not seen by me.  Kerin Ransom PA-C 11/14/2015 4:51 PM

## 2015-11-14 NOTE — Progress Notes (Signed)
PROGRESS NOTE    GYASI COVER  [id number] DOB: 1940/11/10 DOA: 11/03/2015 PCP: [name], MD   Brief Narrative:  75 y.o. M w/ a Hx of HTN, HLD, PAF on xarelto, Chronic Systolic CHF EF 123456 in 04/2015, S/P pacemaker, CKD stage III, and DM2 who presented with 3 weeks of progressively worsening weakness. Lab work revealed a hemoglobin of 4.6.  Since his admission he has received a total of 6 units of packed red blood cells. Status post EGD and colonoscopy on 5/28. Left hemicolectomy on 6/2.  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  -Tolerated clear liquid diet.  Advancing to full liquid today.   Acute blood loss anemia -S/P 8 units PRBC + 2 units of FFP  -Hemoglobin currently 8.6 mildly trending downward -The monitor CBC closely  Iron deficiency -Given IV iron  Paroxysmal Atrial fibrillation/ flutter on chronic anticoagulation -CHADS2VASC is 3  -currently in sinus rhythm -Cardiology consulted and appreciated.   Chronic Diastolic CHF  -no evidence of signif volume overload at this time  -Continue to monitor intake/output, daily weights  Urinary retention  -S/p foley placement  -once pt more active, will plan voiding trial  -Started on urecholine for short trial  Essential hypertension -Continue Coreg, Cardura  CHB s/p Pacemaker -Implantation of a Chemical engineer CRT PPM on 05/02/15 by [name] for symptomatic bradycardia  Acute kidney injury on chronic kidney disease stage III -baseline Cr~1.4-1.9  -acutely worsened secondary to acute anemia  -holding ACE inhibitor -Creatinine appears to be back at baseline currently 1.45 -Continue to monitor  BMP  Diabetes mellitus, type 2 -5/31 for globin A1c 7.3  -Continue insulin sliding scale CBG monitoring  Hyperlipidemia -Continue statin  BPH -Continue doxazosin  DVT Prophylaxis  SCDs  Code Status: Full  Family Communication: None at bedside.  Disposition Plan: admitted.   Consultants [facility] Gastroenterology Cardiology Gen. surgery  Procedures  5/28 EGD - One non-bleeding duodenal ulcer with no stigmata of bleeding - Duodenal erosions without bleeding. 5/28 colonoscopy - tumor transverse colon. Biopsied. - Three 8 to 14 mm polyps transverse colon,at the hepatic flexure and in the proximal ascending colon, - One 11 mm polyp at the hepatic flexure, - Seven 5 to 10 mm polyps transverse colon/ hepatic flexure - One 40 mm polyp at the recto-sigmoid colon.  - Diverticulosis in the entire examined colon. 5/31 CT abdomen pelvis W contrast; negative metastatic disease-small bilateral pleural effusions 6/2 LAROSCOPIC LEFT COLECTOMY LAPAROSCOPIC MOBILIZATION OF SPLENIC FLEXURE   Antibiotics   Anti-infectives    Start     Dose/Rate Route Frequency Ordered Stop   11/10/15 1230  cefoTEtan (CEFOTAN) 2 g in dextrose 5 % 50 mL IVPB     2 g 100 mL/hr over 30 Minutes Intravenous To ShortStay Procedural 11/09/15 1131 11/10/15 1330   11/10/15 1211  cefoTEtan in Dextrose 5% (CEFOTAN) 2-2.08 GM-% IVPB    Comments:  Vira Agar, [name]   : cabinet override      11/10/15 1211 11/11/15 0029   11/09/15 1145  cefoTEtan (CEFOTAN) 2 g in dextrose 5 % 50 mL IVPB  Status:  Discontinued     2 g 100 mL/hr over 30 Minutes Intravenous On call to O.R. 11/09/15 1131 11/09/15 1139  Subjective:   Nolene Bernheim seen and examined today.  Patient denies any chest pain, shortness breath, abdominal pain, dizziness or headache. Does endorse gas, denies any bowel movement. States he's only been drinking liquids. Has been up and walking. Wishes to go home.  Objective:   Filed Vitals:   11/13/15 1509  11/13/15 1810 11/13/15 1951 11/14/15 0534  BP:  156/58 154/70 155/66  Pulse:  70 73 79  Temp: 97.9 F (36.6 C) 98 F (36.7 C) 98.2 F (36.8 C) 98.1 F (36.7 C)  TempSrc: Oral Oral Oral Oral  Resp:  20 18 19   Height:      Weight:      SpO2:  100% 100% 100%    Intake/Output Summary (Last 24 hours) at 11/14/15 1216 Last data filed at 11/14/15 1130  Gross per 24 hour  Intake   1116 ml  Output   1900 ml  Net   -784 ml   Filed Weights   11/09/15 0451 11/10/15 0455 11/13/15 0448  Weight: 117.754 kg (259 lb 9.6 oz) 118.797 kg (261 lb 14.4 oz) 119.795 kg (264 lb 1.6 oz)    Exam  General: Well developed, well nourished, NAD, appears stated age  28: NCAT,mucous membranes moist.   Cardiovascular: S1 S2 auscultated, RRR, no murmurs  Respiratory: Clear to auscultation bilaterally   Abdomen: Soft, nontender, mildly distended, + bowel sounds. Incisions clean  Extremities: warm dry without cyanosis clubbing or edema  Neuro: AAOx3, Nonfocal  Psych: Normal affect and demeanor    Data Reviewed: I have personally reviewed following labs and imaging studies  CBC:  Recent Labs Lab 11/10/15 1823 11/11/15 0453 11/12/15 0403 11/13/15 1207 11/14/15 0617  WBC 5.8 11.1* 13.5* 8.9 6.2  HGB 9.3* 10.0* 10.2* 9.2* 8.6*  HCT 30.8* 32.2* 32.5* 30.4* 27.9*  MCV 73.7* 73.0* 72.2* 72.9* 72.7*  PLT 209 203 216 230 [id number]   Basic Metabolic Panel:  Recent Labs Lab 11/10/15 1823 11/11/15 0453 11/12/15 0403 11/13/15 1207 11/14/15 0617  NA 137 137 136 138 139  K 4.1 4.6 4.2 4.2 4.1  CL 111 108 108 111 107  CO2 20* 21* 20* 22 23  GLUCOSE 168* 131* 154* 191* 145*  BUN 8 8 9 10 8   CREATININE 1.49* 1.56* 1.75* 1.56* 1.45*  CALCIUM 9.2 9.7 10.1 10.1 10.5*   GFR: Estimated Creatinine Clearance: 62.3 mL/min (by C-G formula based on Cr of 1.45). Liver Function Tests:  Recent Labs Lab 11/09/15 0250 11/10/15 0312 11/11/15 0453 11/12/15 0403 11/14/15 0617  AST 17 20 20 22 21     ALT 16* 17 15* 17 17  ALKPHOS 52 48 50 61 53  BILITOT 1.2 1.2 1.0 1.5* 1.0  PROT 5.7* 5.8* 5.6* 6.5 5.4*  ALBUMIN 2.6* 2.6* 2.5* 2.6* 2.3*   No results for input(s): LIPASE, AMYLASE in the last 168 hours. No results for input(s): AMMONIA in the last 168 hours. Coagulation Profile: No results for input(s): INR, PROTIME in the last 168 hours. Cardiac Enzymes: No results for input(s): CKTOTAL, CKMB, CKMBINDEX, TROPONINI in the last 168 hours. BNP (last 3 results) No results for input(s): PROBNP in the last 8760 hours. HbA1C: No results for input(s): HGBA1C in the last 72 hours. CBG:  Recent Labs Lab 11/13/15 1948 11/14/15 0021 11/14/15 0533 11/14/15 0736 11/14/15 1202  GLUCAP 149* 170* 136* 136* 189*   Lipid Profile: No results for input(s): CHOL, HDL, LDLCALC, TRIG, CHOLHDL, LDLDIRECT in the last 72 hours. Thyroid Function Tests: No results for input(s): TSH,  T4TOTAL, FREET4, T3FREE, THYROIDAB in the last 72 hours. Anemia Panel: No results for input(s): VITAMINB12, FOLATE, FERRITIN, TIBC, IRON, RETICCTPCT in the last 72 hours. Urine analysis:    Component Value Date/Time   COLORURINE YELLOW 11/12/2015 1153   APPEARANCEUR HAZY* 11/12/2015 1153   LABSPEC 1.013 11/12/2015 1153   PHURINE 5.0 11/12/2015 1153   GLUCOSEU NEGATIVE 11/12/2015 1153   HGBUR LARGE* 11/12/2015 1153   [place]uck 11/12/2015 1153   KETONESUR NEGATIVE 11/12/2015 1153   PROTEINUR NEGATIVE 11/12/2015 1153   NITRITE NEGATIVE 11/12/2015 1153   LEUKOCYTESUR NEGATIVE 11/12/2015 1153   Sepsis Labs: @LABRCNTIP (procalcitonin:4,lacticidven:4)  ) Recent Results (from the past 240 hour(s))  Surgical pcr screen     Status: Abnormal   Collection Time: 11/10/15  2:23 AM  Result Value Ref Range Status   MRSA, PCR NEGATIVE NEGATIVE Final   Staphylococcus aureus POSITIVE (A) NEGATIVE Final    Comment:        The Xpert SA Assay (FDA approved for NASAL specimens in patients over 21 years of  age), is one component of a comprehensive surveillance program.  Test performance has been validated by [facility] for patients greater than or equal to 26 year old. It is not intended to diagnose infection nor to guide or monitor treatment.       Radiology Studies: No results found.   Scheduled Meds: . alvimopan  12 mg Oral BID  . carvedilol  12.5 mg Oral q morning - 10a  . doxazosin  8 mg Oral QHS  . heparin subcutaneous  5,000 Units Subcutaneous Q8H  . insulin aspart  0-9 Units Subcutaneous Q4H  . pantoprazole  40 mg Oral BID  . rosuvastatin  20 mg Oral Daily   Continuous Infusions: . 0.45 % NaCl with KCl 20 mEq / L 30 mL/hr at 11/13/15 1048     LOS: 11 days   Time Spent in minutes   30 minutes  Jess Toney D.O. on 11/14/2015 at 12:16 PM  Between 7am to 7pm - Pager - [phone]  After 7pm go to [email or web address] - password [email or web address]  And look for the night coverage person covering for me after hours  [facility] Office  [phone]

## 2015-11-14 NOTE — Progress Notes (Signed)
[facility] Progress Note  4 Days Post-Op  Subjective: Sitting up in bed, looks great. Daughter in room. Ate all of his breakfast. Tolerating clears no emesis. Increased flatus this AM. BM this afternoon. Ambulated 3 laps yesterday. Foley in place. Pain controlled - none at rest; soreness with coughing or movement. Using IS once an hour, will try increasing to every 30 minutes today. Pulling 1500 on IS. Pt wants to know when his foley can come out, but does not want to have to have it replaced.  Pt is anxious to get out of the hospital.  Foley 24h: 2,200  Objective: Vital signs in last 24 hours: Temp:  [97.9 F (36.6 C)-98.4 F (36.9 C)] 98.1 F (36.7 C) (06/06 0534) Pulse Rate:  [70-79] 79 (06/06 0534) Resp:  [16-20] 19 (06/06 0534) BP: (145-156)/(58-70) 155/66 mmHg (06/06 0534) SpO2:  [100 %] 100 % (06/06 0534) Last BM Date: 11/12/15  Intake/Output from previous day: 06/05 0701 - 06/06 0700 In: 1116 [I.V.:1116] Out: 2200 [Urine:2200] Intake/Output this shift:    PE: Gen:  Alert, NAD, pleasant Card:  RRR, no M/G/R heard Pulm:  CTABL, unlabored. Abd: Soft, NT, distended, hypoactive BS, no HSM, incisions C/D/I, honeycomb dressing over LUQ  Ext:  No erythema, edema, or tenderness, radial pulse 2+BL  Lab Results:   Recent Labs  11/13/15 1207 11/14/15 0617  WBC 8.9 6.2  HGB 9.2* 8.6*  HCT 30.4* 27.9*  PLT 230 247   BMET  Recent Labs  11/13/15 1207 11/14/15 0617  NA 138 139  K 4.2 4.1  CL 111 107  CO2 22 23  GLUCOSE 191* 145*  BUN 10 8  CREATININE 1.56* 1.45*  CALCIUM 10.1 10.5*   PT/INR No results for input(s): LABPROT, INR in the last 72 hours. CMP     Component Value Date/Time   NA 139 11/14/2015 0617   K 4.1 11/14/2015 0617   CL 107 11/14/2015 0617   CO2 23 11/14/2015 0617   GLUCOSE 145* 11/14/2015 0617   BUN 8 11/14/2015 0617   CREATININE 1.45* 11/14/2015 0617   CREATININE 1.43* 05/08/2015 1016   CALCIUM 10.5* 11/14/2015 0617    PROT 5.4* 11/14/2015 0617   ALBUMIN 2.3* 11/14/2015 0617   AST 21 11/14/2015 0617   ALT 17 11/14/2015 0617   ALKPHOS 53 11/14/2015 0617   BILITOT 1.0 11/14/2015 0617   GFRNONAA 46* 11/14/2015 0617   GFRAA 53* 11/14/2015 0617   Lipase  No results found for: LIPASE     Studies/Results: No results found.  Anti-infectives: Anti-infectives    Start     Dose/Rate Route Frequency Ordered Stop   11/10/15 1230  cefoTEtan (CEFOTAN) 2 g in dextrose 5 % 50 mL IVPB     2 g 100 mL/hr over 30 Minutes Intravenous To ShortStay Procedural 11/09/15 1131 11/10/15 1330   11/10/15 1211  cefoTEtan in Dextrose 5% (CEFOTAN) 2-2.08 GM-% IVPB    Comments:  Vira Agar, [name]   : cabinet override      11/10/15 1211 11/11/15 0029   11/09/15 1145  cefoTEtan (CEFOTAN) 2 g in dextrose 5 % 50 mL IVPB  Status:  Discontinued     2 g 100 mL/hr over 30 Minutes Intravenous On call to O.R. 11/09/15 1131 11/09/15 1139       Assessment/Plan Transverse colon cancer (adenocarcinoma),with lagre benign rectosigmoid polyp POD#4: S/p LAPAROSCOPIC LEFT COLECTOMY LAPAROSCOPIC MOBILIZATION OF SPLENIC FLEXURE, 11/10/15, Dr. Greer Pickerel - left-sided incision healing nicely, honeycomb removed, well approximated, some serosanguinous  drainage on bandage from left side of incision. Antibiotic ointment applied, followed by an ABD and tape.  FEN: advance to full liquid diet, then as tolerated, d/c foley ID: prophylactic Cefotetan pre-op DVT Proph: Heparin/SCD's Dispo: floor, ambulate  Multiple colonic polyps Post op urinary retention Hx of duodenal ulcer/GI bleed/BPH Anemia/chronic blood loss PAF; chronic anticoagulation, Xarelto pre admit Hx of Complete heart block with pacer AODM Hypertension Acute on chronic kidney disease creatinine is 1.75 11/11/15 Dyslipidemia    LOS: 11 days    Jill Alexanders , [facility] Surgery 11/14/2015, 8:56 AM Pager: [phone] Mon-Fri 7:00 am-4:30 pm Sat-Sun 7:00 am-11:30  am

## 2015-11-15 DIAGNOSIS — C189 Malignant neoplasm of colon, unspecified: Secondary | ICD-10-CM

## 2015-11-15 DIAGNOSIS — K922 Gastrointestinal hemorrhage, unspecified: Secondary | ICD-10-CM

## 2015-11-15 LAB — GLUCOSE, CAPILLARY
GLUCOSE-CAPILLARY: 164 mg/dL — AB (ref 65–99)
Glucose-Capillary: 171 mg/dL — ABNORMAL HIGH (ref 65–99)

## 2015-11-15 MED ORDER — PANTOPRAZOLE SODIUM 40 MG PO TBEC: 40.0000 mg | DELAYED_RELEASE_TABLET | Freq: Two times a day (BID) | ORAL | Status: DC

## 2015-11-15 MED ORDER — FERROUS SULFATE 325 (65 FE) MG PO TBEC: 325.0000 mg | DELAYED_RELEASE_TABLET | Freq: Every day | ORAL | Status: DC

## 2015-11-15 NOTE — Discharge Instructions (Signed)
Please see your Primary care doctor for follow up in 1 week You should receive a call for an appointment with [facility] in the next week.  If you have not received a call about your appointment with oncology you should call your PCP for assistance.   Hold off taking lasix until you follow up with your primary care physician and have your labs checked again.  See your surgeon Dr. Freda Munro for follow up in the next 7-10 days.  Check blood sugars 1-2 times per day and report to primary care physician.  See your cardiologist in next 7-10 days for follow up.  Return if symptoms recur, worsen or new problems develop.   Blood Transfusion, Care After Refer to this sheet in the next few weeks. These instructions provide you with information about caring for yourself after your procedure. Your health care provider may also give you more specific instructions. Your treatment has been planned according to current medical practices, but problems sometimes occur. Call your health care provider if you have any problems or questions after your procedure. WHAT TO EXPECT AFTER THE PROCEDURE After your procedure, it is common to have:  Bruising and soreness at the IV site.  Chills or fever.  Headache. HOME CARE INSTRUCTIONS  Take medicines only as directed by your health care provider. Ask your health care provider if you can take an over-the-counter pain reliever in case you have a fever or headache a day or two after your transfusion.  Return to your normal activities as directed by your health care provider. SEEK MEDICAL CARE IF:   You develop redness or irritation at your IV site.  You have persistent fever, chills, or headache.  Your urine is darker than normal.  Your urine turns pink, red, or brown.   The white part of your eye turns yellow (jaundice).   You feel weak after doing your normal activities.  SEEK IMMEDIATE MEDICAL CARE IF:   You have trouble breathing.  You have fever  and chills along with:  Anxiety.  Chest or back pain.  Flushed skin.  Clammy skin.  A rapid heartbeat.  Nausea.   This information is not intended to replace advice given to you by your health care provider. Make sure you discuss any questions you have with your health care provider.   Document Released: 06/17/2014 Document Reviewed: 06/17/2014 Elsevier Interactive Patient Education [employer].   Colorectal Cancer Colorectal cancer is an abnormal growth of tissue (tumor) in the colon or rectum that is cancerous (malignant). Unlike noncancerous (benign) tumors, malignant tumors can spread to other parts of your body. The colon is the large bowel or large intestine. The rectum is the last several inches of the colon.  RISK FACTORS The exact cause of colorectal cancer is unknown. However, the following factors may increase your chances of getting colorectal cancer:   Age older than 33 years.   Abnormal growths (polyps) on the inner wall of the colon or rectum.   Diabetes.   African American race.   Family history of hereditary nonpolyposis colorectal cancer. This condition is caused by changes in the genes that are responsible for repairing mismatched DNA.   Personal history of cancer. A person who has already had colorectal cancer may develop it a second time. Also, women with a history of ovarian, uterine, or breast cancer are at a somewhat higher risk of developing colorectal cancer.  Certain hereditary conditions.  Eating a diet that is high in fat (especially  animal fat) and low in fiber, fruits, and vegetables.  Sedentary lifestyle.  Inflammatory bowel disease, including ulcerative colitis and Crohn's disease.   Smoking.   Excessive alcohol use.  SYMPTOMS Early colorectal cancer often does not cause symptoms. As the cancer grows, symptoms may include:   Changes in bowel habits.  Diarrhea.   Constipation.   Feeling like the bowel does not  empty completely after a bowel movement.   Blood in the stool.   Stools that are narrower than usual.   Abdominal discomfort, pain, bloating, fullness, or cramps.  Frequent gas pain.   Unexplained weight loss.   Constant tiredness.   Nausea and vomiting.  DIAGNOSIS  Your health care provider will ask about your medical history. He or she may also perform a number of procedures, such as:   A physical exam.  A digital rectal exam.  A fecal occult blood test.  A barium enema.  Blood tests.   X-rays.   Imaging tests, such as CT scans or MRIs.   Taking a tissue sample (biopsy) from your colon or rectum to look for cancer cells.   A sigmoidoscopy to view the inside of the last part of your colon.   A colonoscopy to view the inside of your entire colon.   An endorectal ultrasound to see how deep a rectal tumor has grown and whether the cancer has spread to lymph nodes or other nearby tissues.  Your cancer will be staged to determine its severity and extent. Staging is a careful attempt to find out the size of the tumor, whether the cancer has spread, and if so, to what parts of the body. You may need to have more tests to determine the stage of your cancer. The test results will help determine what treatment plan is best for you.   Stage 0. The cancer is found only in the innermost lining of the colon or rectum.   Stage I. The cancer has grown into the inner wall of the colon or rectum. The cancer has not yet reached the outer wall of the colon.   Stage II. The cancer extends more deeply into or through the wall of the colon or rectum. It may have invaded nearby tissue, but cancer cells have not spread to the lymph nodes.   Stage III. The cancer has spread to nearby lymph nodes but not to other parts of the body.   Stage IV. The cancer has spread to other parts of the body, such as the liver or lungs.  Your health care provider may tell you the detailed  stage of your cancer, which includes both a number and a letter.  TREATMENT  Depending on the type and stage, colorectal cancer may be treated with surgery, radiation therapy, chemotherapy, targeted therapy, or radiofrequency ablation. Some people have a combination of these therapies. Surgery may be done to remove the polyps from your colon. In early stages, your health care provider may be able to do this during a colonoscopy. In later stages, surgery may be done to remove part of your colon.  HOME CARE INSTRUCTIONS   Take medicines only as directed by your health care provider.   Maintain a healthy diet.   Consider joining a support group. This may help you learn to cope with the stress of having colorectal cancer.   Seek advice to help you manage treatment of side effects.   Keep all follow-up visits as directed by your health care provider.   Inform  your cancer specialist if you are admitted to the hospital.  [address] IF:  Your diarrhea or constipation does not go away.   Your bowel habits change.  You have increased abdominal pain.   You notice new fatigue or weakness.  You lose weight.   This information is not intended to replace advice given to you by your health care provider. Make sure you discuss any questions you have with your health care provider.   Document Released: 05/27/2005 Document Revised: 06/17/2014 Document Reviewed: 11/19/2012 Elsevier Interactive Patient Education 2016 [employer].   Anemia, Nonspecific Anemia is a condition in which the concentration of red blood cells or hemoglobin in the blood is below normal. Hemoglobin is a substance in red blood cells that carries oxygen to the tissues of the body. Anemia results in not enough oxygen reaching these tissues.  CAUSES  Common causes of anemia include:   Excessive bleeding. Bleeding may be internal or external. This includes excessive bleeding from periods (in women) or from the  intestine.   Poor nutrition.   Chronic kidney, thyroid, and liver disease.  Bone marrow disorders that decrease red blood cell production.  Cancer and treatments for cancer.  HIV, AIDS, and their treatments.  Spleen problems that increase red blood cell destruction.  Blood disorders.  Excess destruction of red blood cells due to infection, medicines, and autoimmune disorders. SIGNS AND SYMPTOMS   Minor weakness.   Dizziness.   Headache.  Palpitations.   Shortness of breath, especially with exercise.   Paleness.  Cold sensitivity.  Indigestion.  Nausea.  Difficulty sleeping.  Difficulty concentrating. Symptoms may occur suddenly or they may develop slowly.  DIAGNOSIS  Additional blood tests are often needed. These help your health care provider determine the best treatment. Your health care provider will check your stool for blood and look for other causes of blood loss.  TREATMENT  Treatment varies depending on the cause of the anemia. Treatment can include:   Supplements of iron, vitamin 123456, or folic acid.   Hormone medicines.   A blood transfusion. This may be needed if blood loss is severe.   Hospitalization. This may be needed if there is significant continual blood loss.   Dietary changes.  Spleen removal. HOME CARE INSTRUCTIONS Keep all follow-up appointments. It often takes many weeks to correct anemia, and having your health care provider check on your condition and your response to treatment is very important. SEEK IMMEDIATE MEDICAL CARE IF:   You develop extreme weakness, shortness of breath, or chest pain.   You become dizzy or have trouble concentrating.  You develop heavy vaginal bleeding.   You develop a rash.   You have bloody or black, tarry stools.   You faint.   You vomit up blood.   You vomit repeatedly.   You have abdominal pain.  You have a fever or persistent symptoms for more than 2-3 days.    You have a fever and your symptoms suddenly get worse.   You are dehydrated.  MAKE SURE YOU:  Understand these instructions.  Will watch your condition.  Will get help right away if you are not doing well or get worse.   This information is not intended to replace advice given to you by your health care provider. Make sure you discuss any questions you have with your health care provider.   Document Released: 07/04/2004 Document Revised: 01/27/2013 Document Reviewed: 11/20/2012 Elsevier Interactive Patient Education [employer].

## 2015-11-15 NOTE — Progress Notes (Signed)
[facility] Progress Note  5 Days Post-Op  Subjective: Sitting up in chair, NAD. Tolerated a regular diet yesterday evening without nausea or vomiting. Denies abdominal pain, only mild pain with coughing. BM yesterday. Having flatus. Urinating without foley. Ambulated 4 laps yesterday. Pulling > 1500 on IS. Denies weakness, dizziness. Pt anxious to go home. Is happy with the care he has received at [facility].  Objective: Vital signs in last 24 hours: Temp:  [98.2 F (36.8 C)-99.1 F (37.3 C)] 99.1 F (37.3 C) (06/07 0507) Pulse Rate:  [64-79] 79 (06/07 0507) Resp:  [18-19] 18 (06/07 0507) BP: (152-155)/(60-67) 155/67 mmHg (06/07 0507) SpO2:  [100 %] 100 % (06/07 0507) Weight:  [117.23 kg (258 lb 7.1 oz)] 117.23 kg (258 lb 7.1 oz) (06/07 0500) Last BM Date: 11/14/15  Intake/Output from previous day: 06/06 0701 - 06/07 0700 In: 1011.5 [P.O.:800; I.V.:211.5] Out: 1600 [Urine:1600] Intake/Output this shift: Total I/O In: -  Out: 200 [Urine:200]  PE: Gen: Alert, NAD, pleasant Card: RRR, no M/G/R heard Pulm: CTABL, unlabored. Abd: Soft, NT, mildly distended, +BS, no HSM, incisions C/D/I, LUQ incision and lap incisions healing appropriately Ext: No erythema, edema, or tenderness  Lab Results:   Recent Labs  11/13/15 1207 11/14/15 0617  WBC 8.9 6.2  HGB 9.2* 8.6*  HCT 30.4* 27.9*  PLT 230 247   BMET  Recent Labs  11/13/15 1207 11/14/15 0617  NA 138 139  K 4.2 4.1  CL 111 107  CO2 22 23  GLUCOSE 191* 145*  BUN 10 8  CREATININE 1.56* 1.45*  CALCIUM 10.1 10.5*   PT/INR No results for input(s): LABPROT, INR in the last 72 hours. CMP     Component Value Date/Time   NA 139 11/14/2015 0617   K 4.1 11/14/2015 0617   CL 107 11/14/2015 0617   CO2 23 11/14/2015 0617   GLUCOSE 145* 11/14/2015 0617   BUN 8 11/14/2015 0617   CREATININE 1.45* 11/14/2015 0617   CREATININE 1.43* 05/08/2015 1016   CALCIUM 10.5* 11/14/2015 0617   PROT 5.4* 11/14/2015 0617   ALBUMIN 2.3* 11/14/2015 0617   AST 21 11/14/2015 0617   ALT 17 11/14/2015 0617   ALKPHOS 53 11/14/2015 0617   BILITOT 1.0 11/14/2015 0617   GFRNONAA 46* 11/14/2015 0617   GFRAA 53* 11/14/2015 0617   Lipase  No results found for: LIPASE     Studies/Results: No results found.  Anti-infectives: Anti-infectives    Start     Dose/Rate Route Frequency Ordered Stop   11/10/15 1230  cefoTEtan (CEFOTAN) 2 g in dextrose 5 % 50 mL IVPB     2 g 100 mL/hr over 30 Minutes Intravenous To ShortStay Procedural 11/09/15 1131 11/10/15 1330   11/10/15 1211  cefoTEtan in Dextrose 5% (CEFOTAN) 2-2.08 GM-% IVPB    Comments:  Vira Agar, [name]   : cabinet override      11/10/15 1211 11/11/15 0029   11/09/15 1145  cefoTEtan (CEFOTAN) 2 g in dextrose 5 % 50 mL IVPB  Status:  Discontinued     2 g 100 mL/hr over 30 Minutes Intravenous On call to O.R. 11/09/15 1131 11/09/15 1139       Assessment/Plan Transverse colon cancer (adenocarcinoma),with lagre benign rectosigmoid polyp POD#5: S/p LAPAROSCOPIC LEFT COLECTOMY LAPAROSCOPIC MOBILIZATION OF SPLENIC FLEXURE, 11/10/15, Dr. Greer Pickerel - LUQ incision well approximated and healing nicely, honeycomb removed. FEN: regular diet, carb modified/low salt ID: prophylactic Cefotetan pre-op DVT Proph: Heparin/SCD's Dispo: floor, ambulate  Anemia/chronic blood loss -hgb  8.6, hct 27.9 on 11/14/15; trending down; medicine managing; already received 6 units PRBCs  Multiple colonic polyps Post op urinary retention Hx of duodenal ulcer/GI bleed/BPH PAF; chronic anticoagulation, Xarelto pre admit Hx of Complete heart block with pacer AODM Hypertension Acute on chronic kidney disease creatinine is 1.75 11/11/15 Dyslipidemia     LOS: 12 days    Jill Alexanders , [facility] 11/15/2015, 7:28 AM Pager: [phone] Mon-Fri 7:00 am-4:30 pm Sat-Sun 7:00 am-11:30 am

## 2015-11-15 NOTE — Progress Notes (Signed)
Pt ready for discharge to home. IV removed.Pt. Is alert and oriented. Pt is hemodynamically stable. AVS reviewed with pt. Capable of re verbalizing medication regimen. Discharge plan appropriate and in place. 

## 2015-11-15 NOTE — Discharge Summary (Signed)
Physician Discharge Summary  Ernest Lara [id number] DOB: 1941/04/21 DOA: 11/03/2015  PCP: [name], MD  Admit date: 11/03/2015 Discharge date: 11/15/2015  Admitted From:  Disposition: Home  Recommendations for Outpatient Follow-up:  1. Follow up with PCP in 1-2 weeks 2. Please obtain BMP/CBC in one week 3. Please follow up on the following pending results:  Home Health: No   Discharge Condition:Stable CODE STATUS: FULL Diet recommendation: Heart Healthy / Carb Modified and soft  Brief/Interim Summary:  Acute GI bleed due to Adenocarcinoma Colon (pathology proven) -EGD showed duodenal erosions without bleeding - DC home on protonix -Colonoscopy showed likely malignant tumor in the transverse colon (adenocarcinoma), multiple polyps -CEA normal -CT scan chest abdomen and pelvis; negative metastatic disease -S/p laparoscopic left colectomy - Gen Surgery following and advancing diet.  -Tolerated regular diet before discharge. - Arranging for oncology follow up - They will call patient with appointment.  - Following up with Gen Surgery and GI.     Acute blood loss anemia -S/P 8 units PRBC + 2 units of FFP  -Hemoglobin currently 8.6, repeat CBC with PCP in 1 week.  Home on Iron sulfate tabs once daily.   Iron deficiency -Given IV iron - Home on iron sulfate tabs once daily  Paroxysmal Atrial fibrillation/ flutter on chronic anticoagulation -CHADS2VASC is 3  -currently in sinus rhythm -Cardiology consulted and appreciated.  Pt to follow up in 1-2 weeks outpatient.  I told him that he was cleared by gen surgery to restart xarelto  but he is hesitant saying that he wants to follow up outpatient and discuss further with his cardiologist before restarting.   Chronic Diastolic CHF  -no evidence of signif volume overload at this time  -Continue to monitor intake/output, daily weights  Urinary retention  -S/p foley placement- now removed -once pt more  active, will plan voiding trial   Essential hypertension -Continue Coreg, Cardura  CHB s/p Pacemaker -Implantation of a [employer] CRT PPM on 05/02/15 by [name] for symptomatic bradycardia  Acute kidney injury on chronic kidney disease stage III -baseline Cr~1.4-1.9  -acutely worsened secondary to acute anemia, now back at baseline -holding ACE inhibitor -Creatinine appears to be back at baseline currently 1.45 -Monitor BMP outpatient with PCP  Diabetes mellitus, type 2 -5/31 for globin A1c 7.3  -Holding metformin for now given renal function.  - Continue sitagliptin.  Hyperlipidemia -Continue statin  BPH -Continue doxazosin  Discharge Diagnoses:  Principal Problem:   GI bleed Active Problems:   DM (diabetes mellitus), type 2    HTN (hypertension)   PAF (paroxysmal atrial fibrillation), recent DCCV 10/11/11 to SR.   Hyperlipidemia   Complete heart block (HCC)   Colon tumor   Iron deficiency anemia   Chronic blood loss anemia   Chronic diastolic heart failure (HCC)   Absolute anemia   Chronic diastolic CHF (congestive heart failure) ([place])   Colonic mass   Adenocarcinoma, colon ([place])   Uncontrolled type 2 diabetes mellitus with complication [facility])   Discharge Instructions      Discharge Instructions    Call MD for:  extreme fatigue    Complete by:  As directed      Diet - low sodium heart healthy    Complete by:  As directed      Discharge instructions    Complete by:  As directed   Please see your Primary care doctor for follow up in 1 week You should receive a call for an appointment with  [facility] in the next week.  If you have not received a call about your appointment with oncology you should call your PCP for assistance.   See your surgeon Dr. Freda Munro for follow up in the next 7-10 days.  Check blood sugars 1-2 times per day and report to primary care physician.  See your cardiologist in next 7-10 days for follow up.  Return if symptoms  recur, worsen or new problems develop.     Discharge wound care:    Complete by:  As directed   Keep dressing and wound clean and dry until you follow up with general surgery office.     Increase activity slowly    Complete by:  As directed      Leave dressing on - Keep it clean, dry, and intact until clinic visit    Complete by:  As directed             Medication List    STOP taking these medications        benazepril 40 MG tablet  Commonly known as:  LOTENSIN     furosemide 80 MG tablet  Commonly known as:  LASIX     metFORMIN 750 MG 24 hr tablet  Commonly known as:  GLUCOPHAGE-XR     naproxen sodium 220 MG tablet  Commonly known as:  ANAPROX     potassium chloride 10 MEQ tablet  Commonly known as:  KLOR-CON M10      TAKE these medications        carvedilol 25 MG tablet  Commonly known as:  COREG  TAKE A HALF TABLET (12.5 MG) EVERY MORNING AND A WHOLE TABLET (25 MG) EVERY EVENING     doxazosin 8 MG tablet  Commonly known as:  CARDURA  TAKE 1 TABLET (8 MG TOTAL) BY MOUTH AT BEDTIME.     ferrous sulfate 325 (65 FE) MG EC tablet  Take 1 tablet (325 mg total) by mouth daily with breakfast.     pantoprazole 40 MG tablet  Commonly known as:  PROTONIX  Take 1 tablet (40 mg total) by mouth 2 (two) times daily.     rosuvastatin 20 MG tablet  Commonly known as:  CRESTOR  TAKE 1 TABLET (20 MG TOTAL) BY MOUTH AT BEDTIME.     sitaGLIPtin 100 MG tablet  Commonly known as:  JANUVIA  Take 100 mg by mouth daily.     XARELTO 20 MG Tabs tablet  Generic drug:  rivaroxaban  TAKE 1 TABLET BY MOUTH DAILY       Follow-up Information    Follow up with [name], MD In 1 week.   Specialty:  Internal Medicine   Why:  Hospital Follow UP   Contact information:   [address] [place] [address] [phone]       Follow up with CORNETT,THOMAS A., MD In 1 week.   Specialty:  General Surgery   Why:  For wound re-check   Contact information:    [place] [address] [phone]       Follow up with Sanda Klein, MD In 1 week.   Specialty:  Cardiology   Why:  Hospital Follow up    Contact information:   [address] [place] [address] [phone]       Follow up with Cleotis Nipper, MD In 1 month.   Specialty:  Gastroenterology   Why:  Hospital Follow Up   Contact  information:   [address] [place] [address] [phone]       Follow up with Oncologist In 1 week.   Why:  The Oncologist office will call you directly with appointment - Call them if you haven't heard anything in the next week     Allergies  Allergen Reactions  . Amiodarone Shortness Of Breath    PULMONARY TOXICITY with AMIODARONE    Consultations:  Cardiology  GI  General Surgery  Procedures/Studies: Dg Chest 2 View  11/03/2015  CLINICAL DATA:  Shortness of breath. EXAM: CHEST  2 VIEW COMPARISON:  May 03, 2015. FINDINGS: The heart size and mediastinal contours are within normal limits. Both lungs are clear. No pneumothorax or pleural effusion is noted. Left-sided pacemaker is unchanged in position. The visualized skeletal structures are unremarkable. IMPRESSION: No active cardiopulmonary disease. Electronically Signed   By: Marijo Conception, M.D.   On: 11/03/2015 19:30   Ct Chest W Contrast  11/08/2015  CLINICAL DATA:  History of colon lesion, cardiomyopathy, chronic kidney disease, diabetes and hypertension EXAM: CT CHEST, ABDOMEN, AND PELVIS WITH CONTRAST TECHNIQUE: Multidetector CT imaging of the chest, abdomen and pelvis was performed following the standard protocol during bolus administration of intravenous contrast. CONTRAST:  156mL ISOVUE-300 IOPAMIDOL (ISOVUE-300) INJECTION 61% COMPARISON:  Chest x-ray of 11/03/2015 FINDINGS: CT CHEST On lung window images, no evidence of lung metastasis is seen. There are small bilateral pleural effusions present. In addition  there are foci of higher attenuation within both lower lobes posteriorly most suspicious for prior aspiration. There are somewhat prominent interstitial markings to the pleura as well which could represent mild changes of interstitial edema. There are degenerative changes throughout the thoracic spine, in the shoulders, and in the sternoclavicular joints. On lung window images, the thyroid gland contains a few small low-attenuation nodules of less than 1 cm in size, of doubtful clinical significance in this age patient. The thoracic aorta opacifies with no significant abnormality noted. The pulmonary arteries also are faintly opacify with no central abnormality evident. The heart is mildly enlarged and there is a small pericardial effusion present. Pacer wires are noted. CT ABDOMEN AND PELVIS The liver enhances with no focal abnormality and no ductal dilatation is seen. No calcified gallstones are noted. The pancreas is normal in size in the pancreatic duct is not dilated. There may be a small lipoma within the third portion of the duodenum. The adrenal glands and the spleen are unremarkable. The stomach is decompressed. There is a small left lower pole renal calculus of approximately 5 mm. No hydronephrosis is seen. On delayed images the pelvocaliceal systems are unremarkable. Small low-attenuation structures are noted bilaterally involving both kidneys most consistent with small cysts but difficult to characterize on this study. Ultrasound may be helpful if further assessment is warranted clinically. The proximal ureters are normal in caliber. The abdominal aorta is normal in caliber with moderate atherosclerotic change. No adenopathy is seen. The urinary bladder is not optimally distended but no abnormality is seen. The urinary bladder is slightly thick walled, most likely due to a degree of bladder outlet obstruction caused by a significant enlarged prostate measuring 6.5 x 6.2 cm. The colon is largely  decompressed and no obvious colonic mass is seen. Areas of contraction i.e. in the distal transverse colon near the splenic flexure of the could conceivably represent a colonic lesion, but this area is poorly distended. It is noted that within the ascending colon near the hepatic flexure  of colon there is and apparent lipoma of 17 mm in diameter with attenuation of -70 age U. scattered diverticula present within the rectosigmoid colon. 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 Also there is significant degenerative change involving the facet joints of the entire lumbar spine. IMPRESSION: 1. Cardiomegaly, small pericardial effusion, small bilateral pleural effusions, and somewhat prominent interstitial markings suggest mild interstitial edema. 2. No evidence of metastatic involvement of the chest, abdomen, or pelvis. 3. The colon is largely decompressed. No obvious mass is seen other than an apparent lipoma within the ascending colon of 17 mm in diameter. 4. There is an area of poor distension of the distal transverse colon near the splenic flexure and a colonic lesion cannot be excluded in that region. 5. 5 mm left lower pole renal calculus without obstruction. Electronically Signed   By: Ivar Drape M.D.   On: 11/08/2015 08:20   Ct Abdomen Pelvis W Contrast  11/08/2015  CLINICAL DATA:  History of colon lesion, cardiomyopathy, chronic kidney disease, diabetes and hypertension EXAM: CT CHEST, ABDOMEN, AND PELVIS WITH CONTRAST TECHNIQUE: Multidetector CT imaging of the chest, abdomen and pelvis was performed following the standard protocol during bolus administration of intravenous contrast. CONTRAST:  162mL ISOVUE-300 IOPAMIDOL (ISOVUE-300) INJECTION 61% COMPARISON:  Chest x-ray of 11/03/2015 FINDINGS: CT CHEST On lung window images, no evidence of lung metastasis is seen. There are small bilateral pleural effusions present. In addition there  are foci of higher attenuation within both lower lobes posteriorly most suspicious for prior aspiration. There are somewhat prominent interstitial markings to the pleura as well which could represent mild changes of interstitial edema. There are degenerative changes throughout the thoracic spine, in the shoulders, and in the sternoclavicular joints. On lung window images, the thyroid gland contains a few small low-attenuation nodules of less than 1 cm in size, of doubtful clinical significance in this age patient. The thoracic aorta opacifies with no significant abnormality noted. The pulmonary arteries also are faintly opacify with no central abnormality evident. The heart is mildly enlarged and there is a small pericardial effusion present. Pacer wires are noted. CT ABDOMEN AND PELVIS The liver enhances with no focal abnormality and no ductal dilatation is seen. No calcified gallstones are noted. The pancreas is normal in size in the pancreatic duct is not dilated. There may be a small lipoma within the third portion of the duodenum. The adrenal glands and the spleen are unremarkable. The stomach is decompressed. There is a small left lower pole renal calculus of approximately 5 mm. No hydronephrosis is seen. On delayed images the pelvocaliceal systems are unremarkable. Small low-attenuation structures are noted bilaterally involving both kidneys most consistent with small cysts but difficult to characterize on this study. Ultrasound may be helpful if further assessment is warranted clinically. The proximal ureters are normal in caliber. The abdominal aorta is normal in caliber with moderate atherosclerotic change. No adenopathy is seen. The urinary bladder is not optimally distended but no abnormality is seen. The urinary bladder is slightly thick walled, most likely due to a degree of bladder outlet obstruction caused by a significant enlarged prostate measuring 6.5 x 6.2 cm. The colon is largely decompressed  and no obvious colonic mass is seen. Areas of contraction i.e. in the distal transverse colon near the splenic flexure of the could conceivably represent a colonic lesion, but this area is poorly distended. It is noted that within the ascending colon near the hepatic  flexure of colon there is and apparent lipoma of 17 mm in diameter with attenuation of -64 age U. scattered diverticula present within the rectosigmoid colon. 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 Also there is significant degenerative change involving the facet joints of the entire lumbar spine. IMPRESSION: 1. Cardiomegaly, small pericardial effusion, small bilateral pleural effusions, and somewhat prominent interstitial markings suggest mild interstitial edema. 2. No evidence of metastatic involvement of the chest, abdomen, or pelvis. 3. The colon is largely decompressed. No obvious mass is seen other than an apparent lipoma within the ascending colon of 17 mm in diameter. 4. There is an area of poor distension of the distal transverse colon near the splenic flexure and a colonic lesion cannot be excluded in that region. 5. 5 mm left lower pole renal calculus without obstruction. Electronically Signed   By: Ivar Drape M.D.   On: 11/08/2015 08:20   Dg Abd Portable 1v  11/05/2015  CLINICAL DATA:  75 year old male with history of colonic neoplasm. EXAM: PORTABLE ABDOMEN - 1 VIEW COMPARISON:  No priors. FINDINGS: Several nondilated gas-filled loops of small bowel are noted throughout the central abdomen. No pathologic dilatation of small bowel or colon. No gross evidence of pneumoperitoneum on these supine images. Clips are seen projecting over the left side of the epigastric region and overlying the right upper quadrant. IMPRESSION: 1. Nonspecific, nonobstructive bowel gas pattern. 2. No pneumoperitoneum. Electronically Signed   By: Vinnie Langton M.D.   On: 11/05/2015 17:34    ( EGD, Colonoscopy,)  - See separate reports   Subjective: Pt anxious to go home, tolerated regular diet, no rectal bleeding, ambulating several times around hall.   Discharge Exam: Filed Vitals:   11/14/15 2053 11/15/15 0507  BP: 154/62 155/67  Pulse: 68 79  Temp: 98.2 F (36.8 C) 99.1 F (37.3 C)  Resp: 19 18   Filed Vitals:   11/14/15 1334 11/14/15 2053 11/15/15 0500 11/15/15 0507  BP: 152/60 154/62  155/67  Pulse: 64 68  79  Temp: 98.8 F (37.1 C) 98.2 F (36.8 C)  99.1 F (37.3 C)  TempSrc: Oral Oral  Oral  Resp:  19  18  Height:      Weight:   258 lb 7.1 oz (117.23 kg)   SpO2:  100%  100%    General: Pt is alert, awake, not in acute distress Cardiovascular: RRR, S1/S2 +, no rubs, no gallops Respiratory: CTA bilaterally, no wheezing, no rhonchi Abdominal: Wounds clean and dry, intact, Soft, NT, ND, bowel sounds + Extremities: no edema, no cyanosis   The results of significant diagnostics from this hospitalization (including imaging, microbiology, ancillary and laboratory) are listed below for reference.     Microbiology: Recent Results (from the past 240 hour(s))  Surgical pcr screen     Status: Abnormal   Collection Time: 11/10/15  2:23 AM  Result Value Ref Range Status   MRSA, PCR NEGATIVE NEGATIVE Final   Staphylococcus aureus POSITIVE (A) NEGATIVE Final    Comment:        The Xpert SA Assay (FDA approved for NASAL specimens in patients over 86 years of age), is one component of a comprehensive surveillance program.  Test performance has been validated by [facility] for patients greater than or equal to 14 year old. It is not intended to diagnose infection nor to guide or monitor treatment.      Labs: BNP (last 3 results)  Recent  Labs  11/03/15 1913  BNP 123XX123*   Basic Metabolic Panel:  Recent Labs Lab 11/10/15 1823 11/11/15 0453 11/12/15 0403 11/13/15 1207 11/14/15 0617  NA 137 137 136 138 139  K 4.1 4.6 4.2 4.2 4.1  CL  111 108 108 111 107  CO2 20* 21* 20* 22 23  GLUCOSE 168* 131* 154* 191* 145*  BUN 8 8 9 10 8   CREATININE 1.49* 1.56* 1.75* 1.56* 1.45*  CALCIUM 9.2 9.7 10.1 10.1 10.5*   Liver Function Tests:  Recent Labs Lab 11/09/15 0250 11/10/15 0312 11/11/15 0453 11/12/15 0403 11/14/15 0617  AST 17 20 20 22 21   ALT 16* 17 15* 17 17  ALKPHOS 52 48 50 61 53  BILITOT 1.2 1.2 1.0 1.5* 1.0  PROT 5.7* 5.8* 5.6* 6.5 5.4*  ALBUMIN 2.6* 2.6* 2.5* 2.6* 2.3*   No results for input(s): LIPASE, AMYLASE in the last 168 hours. No results for input(s): AMMONIA in the last 168 hours. CBC:  Recent Labs Lab 11/10/15 1823 11/11/15 0453 11/12/15 0403 11/13/15 1207 11/14/15 0617  WBC 5.8 11.1* 13.5* 8.9 6.2  HGB 9.3* 10.0* 10.2* 9.2* 8.6*  HCT 30.8* 32.2* 32.5* 30.4* 27.9*  MCV 73.7* 73.0* 72.2* 72.9* 72.7*  PLT 209 203 216 230 247   Cardiac Enzymes: No results for input(s): CKTOTAL, CKMB, CKMBINDEX, TROPONINI in the last 168 hours. BNP: Invalid input(s): POCBNP CBG:  Recent Labs Lab 11/14/15 1202 11/14/15 1611 11/14/15 2119 11/15/15 0736 11/15/15 1145  GLUCAP 189* 192* 202* 164* 171*   D-Dimer No results for input(s): DDIMER in the last 72 hours. Hgb A1c No results for input(s): HGBA1C in the last 72 hours. Lipid Profile No results for input(s): CHOL, HDL, LDLCALC, TRIG, CHOLHDL, LDLDIRECT in the last 72 hours. Thyroid function studies No results for input(s): TSH, T4TOTAL, T3FREE, THYROIDAB in the last 72 hours.  Invalid input(s): FREET3 Anemia work up No results for input(s): VITAMINB12, FOLATE, FERRITIN, TIBC, IRON, RETICCTPCT in the last 72 hours. Urinalysis    Component Value Date/Time   COLORURINE YELLOW 11/12/2015 1153   APPEARANCEUR HAZY* 11/12/2015 1153   LABSPEC 1.013 11/12/2015 1153   PHURINE 5.0 11/12/2015 1153   GLUCOSEU NEGATIVE 11/12/2015 1153   HGBUR LARGE* 11/12/2015 1153   BILIRUBINUR NEGATIVE 11/12/2015 1153   KETONESUR NEGATIVE 11/12/2015 1153    PROTEINUR NEGATIVE 11/12/2015 1153   NITRITE NEGATIVE 11/12/2015 1153   LEUKOCYTESUR NEGATIVE 11/12/2015 1153   Sepsis Labs Invalid input(s): PROCALCITONIN,  WBC,  LACTICIDVEN Microbiology Recent Results (from the past 240 hour(s))  Surgical pcr screen     Status: Abnormal   Collection Time: 11/10/15  2:23 AM  Result Value Ref Range Status   MRSA, PCR NEGATIVE NEGATIVE Final   Staphylococcus aureus POSITIVE (A) NEGATIVE Final    Comment:        The Xpert SA Assay (FDA approved for NASAL specimens in patients over 17 years of age), is one component of a comprehensive surveillance program.  Test performance has been validated by [facility] for patients greater than or equal to 28 year old. It is not intended to diagnose infection nor to guide or monitor treatment.     Time coordinating discharge: 45 minutes  SIGNED:  Irwin Brakeman, MD  [facility] 11/15/2015, 12:25 PM Pager   If 7PM-7AM, please contact night-coverage [email or web address] Password [email or web address]

## 2015-11-16 ENCOUNTER — Encounter: Payer: Self-pay | Admitting: Hematology

## 2015-11-16 ENCOUNTER — Telehealth: Payer: Self-pay | Admitting: Hematology

## 2015-11-16 NOTE — Telephone Encounter (Signed)
Tried to contact pt regarding his appt change with [facility] vm full. Mailed out new pt letter

## 2015-11-19 LAB — CUP PACEART REMOTE DEVICE CHECK
Battery Remaining Longevity: 126 mo
Battery Remaining Percentage: 100 %
Implantable Lead Implant Date: 20161122
Implantable Lead Implant Date: 20161122
Implantable Lead Location: 753858
Implantable Lead Location: 753860
Implantable Lead Model: 4674
Implantable Lead Serial Number: 521932
Implantable Lead Serial Number: 689802
Implantable Lead Serial Number: 693952
Lead Channel Impedance Value: 1038 Ohm
Lead Channel Pacing Threshold Amplitude: 0.5 V
Lead Channel Pacing Threshold Amplitude: 1.3 V
Lead Channel Pacing Threshold Pulse Width: 0.4 ms
Lead Channel Setting Pacing Amplitude: 2.5 V
Lead Channel Setting Pacing Pulse Width: 0.4 ms
Lead Channel Setting Sensing Sensitivity: 2.5 mV
MDC IDC LEAD IMPLANT DT: 20161122
MDC IDC LEAD LOCATION: 753859
MDC IDC MSMT LEADCHNL LV PACING THRESHOLD PULSEWIDTH: 0.4 ms
MDC IDC MSMT LEADCHNL RA IMPEDANCE VALUE: 713 Ohm
MDC IDC MSMT LEADCHNL RA PACING THRESHOLD PULSEWIDTH: 0.4 ms
MDC IDC MSMT LEADCHNL RV IMPEDANCE VALUE: 817 Ohm
MDC IDC MSMT LEADCHNL RV PACING THRESHOLD AMPLITUDE: 0.7 V
MDC IDC SESS DTM: 20170524141000
MDC IDC SET LEADCHNL LV SENSING SENSITIVITY: 2.5 mV
MDC IDC SET LEADCHNL RA PACING AMPLITUDE: 3.5 V
MDC IDC SET LEADCHNL RV PACING AMPLITUDE: 2 V
MDC IDC SET LEADCHNL RV PACING PULSEWIDTH: 0.4 ms
MDC IDC STAT BRADY RA PERCENT PACED: 0 %
MDC IDC STAT BRADY RV PERCENT PACED: 99 %
Pulse Gen Serial Number: 702386

## 2015-11-20 ENCOUNTER — Telehealth: Payer: Self-pay | Admitting: *Deleted

## 2015-11-20 NOTE — Telephone Encounter (Signed)
  Oncology Nurse Navigator Documentation  Navigator Location: [facility] (11/20/15 1202) Navigator Encounter Type: Introductory phone call (11/20/15 1202)   Left VM on home # that appointment with Dr. Irene Limbo is on 6/13 at 2 pm. If he has questions, call back and ask for HIM department.

## 2015-11-21 ENCOUNTER — Other Ambulatory Visit: Payer: Medicare Other

## 2015-11-21 ENCOUNTER — Telehealth: Payer: Self-pay | Admitting: Hematology

## 2015-11-21 ENCOUNTER — Encounter: Payer: Self-pay | Admitting: Hematology

## 2015-11-21 ENCOUNTER — Ambulatory Visit (HOSPITAL_BASED_OUTPATIENT_CLINIC_OR_DEPARTMENT_OTHER): Payer: Medicare Other | Admitting: Hematology

## 2015-11-21 VITALS — BP 141/55 | HR 78 | Temp 97.6°F | Resp 16 | Ht 75.0 in | Wt 258.0 lb

## 2015-11-21 DIAGNOSIS — D509 Iron deficiency anemia, unspecified: Secondary | ICD-10-CM

## 2015-11-21 DIAGNOSIS — I480 Paroxysmal atrial fibrillation: Secondary | ICD-10-CM | POA: Diagnosis not present

## 2015-11-21 DIAGNOSIS — C184 Malignant neoplasm of transverse colon: Secondary | ICD-10-CM

## 2015-11-21 DIAGNOSIS — C189 Malignant neoplasm of colon, unspecified: Secondary | ICD-10-CM

## 2015-11-21 DIAGNOSIS — E119 Type 2 diabetes mellitus without complications: Secondary | ICD-10-CM | POA: Diagnosis not present

## 2015-11-21 DIAGNOSIS — I10 Essential (primary) hypertension: Secondary | ICD-10-CM

## 2015-11-21 DIAGNOSIS — N183 Chronic kidney disease, stage 3 (moderate): Secondary | ICD-10-CM

## 2015-11-21 NOTE — Progress Notes (Signed)
Marland Kitchen    HEMATOLOGY/ONCOLOGY CONSULTATION NOTE  Date of Service: 11/21/2015  Patient Care Team: [name], MD as PCP - General (Endocrinology)  CHIEF COMPLAINTS/PURPOSE OF CONSULTATION:  Stage III Colon Cancer  HISTORY OF PRESENTING ILLNESS:   Ernest Lara is a wonderful 75 y.o. male who has been referred to [place] by Dr .[name], MD for evaluation and management of Stage III colon cancer.  Patient has a h/o HTN, DM2, OSA , CHB s/p PPM, P afib (on anticoagulation), h/o chronic diastolic Non-ischemic cardiomyopathy, CKD stage 3 who recently presented to the hospital with acute GI bleeding requiring 8 units of PRBC's and 2 units of FFP. He also received IV iron for iron deficiency. He has a GI workup. EGD showed non bleeding helicobacter pylori positive ulceration (patient has completed ABx treatment for this). 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 Patient had a laparoscopic left colectomy.on 11/10/2015. He has been doing well post-operatively but has some superficial open areas at the incision site which have not yet healed. He has a CT chest/abd/pelvis in the hospital that did not show any evidence of distal metastatic disease. Pathology results from the surgical sample showed pT3, pN1c Mx (Stage IIIB) colon cancer MSI stable. Patient is here to discuss adjuvant treatment options. His anticoagulation for on hold for surgery and he is back on Xarelto for his P Afib per cardiology. Notes no neuropathy from his diabetes.  MEDICAL HISTORY:  Past Medical History  Diagnosis Date  . Essential hypertension   . Type 2 diabetes mellitus ([place])   . OSA (obstructive sleep apnea), pt with apnea during procedures 10/11/2011  . Complete heart block [facility])     [employer] PPM, Dr. Lovena Le 05/02/15  . Paroxysmal atrial fibrillation (HCC)   . History of cardiomyopathy     Nonischemic, possibly tachycardia mediated, LVEF 60-65%  04/2015  . CKD (chronic kidney disease) stage 3, GFR 30-59 ml/min     SURGICAL HISTORY: Past Surgical History  Procedure Laterality Date  . No past surgeries    . Tee without cardioversion  10/10/2011    Procedure: TRANSESOPHAGEAL ECHOCARDIOGRAM (TEE);  Surgeon: Sanda Klein, MD;  Location: [place];  Service: Cardiovascular;  Laterality: N/A;  . Cardioversion  10/11/2011    Procedure: CARDIOVERSION;  Surgeon: Leonie Man, MD;  Location: [place];  Service: Cardiovascular;  Laterality: N/A;  . Nuclear stress  11/01/2011    Severe global hypokinesis,dilated ventricle  . Ep implantable device N/A 05/02/2015    Procedure: BiV Pacemaker Insertion CRT-P;  Surgeon: Evans Lance, MD;  Location: [place] CV LAB;  Service: Cardiovascular;  Laterality: N/A;  . Esophagogastroduodenoscopy N/A 11/05/2015    Procedure: ESOPHAGOGASTRODUODENOSCOPY (EGD);  Surgeon: Ronald Lobo, MD;  Location: [facility] ENDOSCOPY;  Service: Endoscopy;  Laterality: N/A;  . Colonoscopy N/A 11/05/2015    Procedure: COLONOSCOPY;  Surgeon: Ronald Lobo, MD;  Location: [facility] ENDOSCOPY;  Service: Endoscopy;  Laterality: N/A;  . Laparoscopic partial colectomy N/A 11/10/2015    Procedure: LAPAROSCOPIC LEFT COLECTOMY LAPAROSCOPIC MOBILIZATION OF SPLENIC FLEXURE;  Surgeon: Greer Pickerel, MD;  Location: [place];  Service: General;  Laterality: N/A;    SOCIAL HISTORY: Social History   Social History  . Marital Status: Married    Spouse Name: N/A  . Number of Children: N/A  . Years of Education: N/A   Occupational History  . Not on file.   Social History Main Topics  . Smoking status: Never  Smoker   . Smokeless tobacco: Never Used  . Alcohol Use: No  . Drug Use: No  . Sexual Activity: Not Currently   Other Topics Concern  . Not on file   Social History Narrative    FAMILY HISTORY: Family History  Problem Relation Age of Onset  . Colon cancer Mother   . Kidney disease Father     ESRF/HD, deceased    ALLERGIES:  is  allergic to amiodarone and allopurinol.  MEDICATIONS:  Current Outpatient Prescriptions  Medication Sig Dispense Refill  . carvedilol (COREG) 25 MG tablet TAKE A HALF TABLET (12.5 MG) EVERY MORNING AND A WHOLE TABLET (25 MG) EVERY EVENING 135 tablet 0  . doxazosin (CARDURA) 8 MG tablet TAKE 1 TABLET (8 MG TOTAL) BY MOUTH AT BEDTIME. 30 tablet 4  . ferrous sulfate 325 (65 FE) MG EC tablet Take 1 tablet (325 mg total) by mouth daily with breakfast. 30 tablet 0  . pantoprazole (PROTONIX) 40 MG tablet Take 1 tablet (40 mg total) by mouth 2 (two) times daily. 60 tablet 0  . rosuvastatin (CRESTOR) 20 MG tablet TAKE 1 TABLET (20 MG TOTAL) BY MOUTH AT BEDTIME. 30 tablet 10  . sitaGLIPtin (JANUVIA) 100 MG tablet Take 100 mg by mouth daily.    Alveda Reasons 20 MG TABS tablet TAKE 1 TABLET BY MOUTH DAILY (Patient not taking: Reported on 11/21/2015) 30 tablet 5   No current facility-administered medications for this visit.    REVIEW OF SYSTEMS:    10 Point review of Systems was done is negative except as noted above.  PHYSICAL EXAMINATION: ECOG PERFORMANCE STATUS: 2 - Symptomatic, <50% confined to bed  . Filed Vitals:   11/21/15 1416  BP: 141/55  Pulse: 78  Temp: 97.6 F (36.4 C)  Resp: 16   Filed Weights   11/21/15 1416  Weight: 258 lb (117.028 kg)   .Body mass index is 32.25 kg/(m^2).  GENERAL:alert, in no acute distress and comfortable SKIN: skin color, texture, turgor are normal, no rashes or significant lesions EYES: normal, conjunctiva are pink and non-injected, sclera clear OROPHARYNX:no exudate, no erythema and lips, buccal mucosa, and tongue normal  NECK: supple, no JVD, thyroid normal size, non-tender, without nodularity LYMPH:  no palpable lymphadenopathy in the cervical, axillary or inguinal LUNGS: clear to auscultation with normal respiratory effort HEART: regular rate & rhythm,  no murmurs and no lower extremity edema ABDOMEN: healing surgical wounds with some open area  with serous discharge, abdomen soft, non-tender, normoactive bowel sounds  Musculoskeletal: no cyanosis of digits and no clubbing  PSYCH: alert & oriented x 3 with fluent speech NEURO: no focal motor/sensory deficits  LABORATORY DATA:  I have reviewed the data as listed  . CBC Latest Ref Rng 11/22/2015 11/14/2015 11/13/2015  WBC 4.0 - 10.3 10e3/uL 6.5 6.2 8.9  Hemoglobin 13.0 - 17.1 g/dL 9.4(L) 8.6(L) 9.2(L)  Hematocrit 38.4 - 49.9 % 29.6(L) 27.9(L) 30.4(L)  Platelets 140 - 400 10e3/uL 380 247 230   . CBC    Component Value Date/Time   WBC 6.5 11/22/2015 0952   WBC 6.2 11/14/2015 0617   RBC 4.03* 11/22/2015 0952   RBC 3.84* 11/14/2015 0617   RBC 2.62* 11/03/2015 1956   HGB 9.4* 11/22/2015 0952   HGB 8.6* 11/14/2015 0617   HCT 29.6* 11/22/2015 0952   HCT 27.9* 11/14/2015 0617   PLT 380 11/22/2015 0952   PLT 247 11/14/2015 0617   MCV 73.4* 11/22/2015 0952   MCV 72.7* 11/14/2015 0617  MCH 23.3* 11/22/2015 0952   MCH 22.4* 11/14/2015 0617   MCHC 31.8* 11/22/2015 0952   MCHC 30.8 11/14/2015 0617   RDW 23.9* 11/22/2015 0952   RDW 23.9* 11/14/2015 0617   LYMPHSABS 1.0 11/22/2015 0952   LYMPHSABS 2.1 05/01/2015 1832   MONOABS 0.5 11/22/2015 0952   MONOABS 0.4 05/01/2015 1832   EOSABS 0.1 11/22/2015 0952   EOSABS 0.1 05/01/2015 1832   BASOSABS 0.0 11/22/2015 0952   BASOSABS 0.0 05/01/2015 1832     . CMP Latest Ref Rng 11/14/2015 11/13/2015 11/12/2015  Glucose 65 - 99 mg/dL 145(H) 191(H) 154(H)  BUN 6 - 20 mg/dL 8 10 9   Creatinine 0.61 - 1.24 mg/dL 1.45(H) 1.56(H) 1.75(H)  Sodium 135 - 145 mmol/L 139 138 136  Potassium 3.5 - 5.1 mmol/L 4.1 4.2 4.2  Chloride 101 - 111 mmol/L 107 111 108  CO2 22 - 32 mmol/L 23 22 20(L)  Calcium 8.9 - 10.3 mg/dL 10.5(H) 10.1 10.1  Total Protein 6.5 - 8.1 g/dL 5.4(L) - 6.5  Total Bilirubin 0.3 - 1.2 mg/dL 1.0 - 1.5(H)  Alkaline Phos 38 - 126 U/L 53 - 61  AST 15 - 41 U/L 21 - 22  ALT 17 - 63 U/L 17 - 17   Component     Latest Ref Rng  11/22/2015  Iron     42 - 163 ug/dL 24 (L)  TIBC     202 - 409 ug/dL 244  UIBC     117 - 376 ug/dL 219  %SAT     20 - 55 % 10 (L)  CEA     0.0 - 4.7 ng/mL 0.9  Ferritin     22 - 316 ng/ml 415 (H)  Vitamin B12     211 - 946 pg/mL 276      RADIOGRAPHIC STUDIES: I have personally reviewed the radiological images as listed and agreed with the findings in the report. Dg Chest 2 View  11/03/2015  CLINICAL DATA:  Shortness of breath. EXAM: CHEST  2 VIEW COMPARISON:  May 03, 2015. FINDINGS: The heart size and mediastinal contours are within normal limits. Both lungs are clear. No pneumothorax or pleural effusion is noted. Left-sided pacemaker is unchanged in position. The visualized skeletal structures are unremarkable. IMPRESSION: No active cardiopulmonary disease. Electronically Signed   By: Marijo Conception, M.D.   On: 11/03/2015 19:30   Ct Chest W Contrast  11/08/2015  CLINICAL DATA:  History of colon lesion, cardiomyopathy, chronic kidney disease, diabetes and hypertension EXAM: CT CHEST, ABDOMEN, AND PELVIS WITH CONTRAST TECHNIQUE: Multidetector CT imaging of the chest, abdomen and pelvis was performed following the standard protocol during bolus administration of intravenous contrast. CONTRAST:  185m ISOVUE-300 IOPAMIDOL (ISOVUE-300) INJECTION 61% COMPARISON:  Chest x-ray of 11/03/2015 FINDINGS: CT CHEST On lung window images, no evidence of lung metastasis is seen. There are small bilateral pleural effusions present. In addition there are foci of higher attenuation within both lower lobes posteriorly most suspicious for prior aspiration. There are somewhat prominent interstitial markings to the pleura as well which could represent mild changes of interstitial edema. There are degenerative changes throughout the thoracic spine, in the shoulders, and in the sternoclavicular joints. On lung window images, the thyroid gland contains a few small low-attenuation nodules of less than 1 cm in  size, of doubtful clinical significance in this age patient. The thoracic aorta opacifies with no significant abnormality noted. The pulmonary arteries also are faintly opacify with no central abnormality evident. The  heart is mildly enlarged and there is a small pericardial effusion present. Pacer wires are noted. CT ABDOMEN AND PELVIS The liver enhances with no focal abnormality and no ductal dilatation is seen. No calcified gallstones are noted. The pancreas is normal in size in the pancreatic duct is not dilated. There may be a small lipoma within the third portion of the duodenum. The adrenal glands and the spleen are unremarkable. The stomach is decompressed. There is a small left lower pole renal calculus of approximately 5 mm. No hydronephrosis is seen. On delayed images the pelvocaliceal systems are unremarkable. Small low-attenuation structures are noted bilaterally involving both kidneys most consistent with small cysts but difficult to characterize on this study. Ultrasound may be helpful if further assessment is warranted clinically. The proximal ureters are normal in caliber. The abdominal aorta is normal in caliber with moderate atherosclerotic change. No adenopathy is seen. The urinary bladder is not optimally distended but no abnormality is seen. The urinary bladder is slightly thick walled, most likely due to a degree of bladder outlet obstruction caused by a significant enlarged prostate measuring 6.5 x 6.2 cm. The colon is largely decompressed and no obvious colonic mass is seen. Areas of contraction i.e. in the distal transverse colon near the splenic flexure of the could conceivably represent a colonic lesion, but this area is poorly distended. It is noted that within the ascending colon near the hepatic flexure of colon there is and apparent lipoma of 17 mm in diameter with attenuation of -46 age U. scattered diverticula present within the rectosigmoid colon. Lumbar vertebrae are normal  alignment with diffuse degenerative disc disease throughout the entire lumbar spine. Also there is significant degenerative change involving the facet joints of the entire lumbar spine. IMPRESSION: 1. Cardiomegaly, small pericardial effusion, small bilateral pleural effusions, and somewhat prominent interstitial markings suggest mild interstitial edema. 2. No evidence of metastatic involvement of the chest, abdomen, or pelvis. 3. The colon is largely decompressed. No obvious mass is seen other than an apparent lipoma within the ascending colon of 17 mm in diameter. 4. There is an area of poor distension of the distal transverse colon near the splenic flexure and a colonic lesion cannot be excluded in that region. 5. 5 mm left lower pole renal calculus without obstruction. Electronically Signed   By: Ivar Drape M.D.   On: 11/08/2015 08:20   Ct Abdomen Pelvis W Contrast  11/08/2015  CLINICAL DATA:  History of colon lesion, cardiomyopathy, chronic kidney disease, diabetes and hypertension EXAM: CT CHEST, ABDOMEN, AND PELVIS WITH CONTRAST TECHNIQUE: Multidetector CT imaging of the chest, abdomen and pelvis was performed following the standard protocol during bolus administration of intravenous contrast. CONTRAST:  158m ISOVUE-300 IOPAMIDOL (ISOVUE-300) INJECTION 61% COMPARISON:  Chest x-ray of 11/03/2015 FINDINGS: CT CHEST On lung window images, no evidence of lung metastasis is seen. There are small bilateral pleural effusions present. In addition there are foci of higher attenuation within both lower lobes posteriorly most suspicious for prior aspiration. There are somewhat prominent interstitial markings to the pleura as well which could represent mild changes of interstitial edema. There are degenerative changes throughout the thoracic spine, in the shoulders, and in the sternoclavicular joints. On lung window images, the thyroid gland contains a few small low-attenuation nodules of less than 1 cm in size, of  doubtful clinical significance in this age patient. The thoracic aorta opacifies with no significant abnormality noted. The pulmonary arteries also are faintly opacify with no central abnormality evident.  The heart is mildly enlarged and there is a small pericardial effusion present. Pacer wires are noted. CT ABDOMEN AND PELVIS The liver enhances with no focal abnormality and no ductal dilatation is seen. No calcified gallstones are noted. The pancreas is normal in size in the pancreatic duct is not dilated. There may be a small lipoma within the third portion of the duodenum. The adrenal glands and the spleen are unremarkable. The stomach is decompressed. There is a small left lower pole renal calculus of approximately 5 mm. No hydronephrosis is seen. On delayed images the pelvocaliceal systems are unremarkable. Small low-attenuation structures are noted bilaterally involving both kidneys most consistent with small cysts but difficult to characterize on this study. Ultrasound may be helpful if further assessment is warranted clinically. The proximal ureters are normal in caliber. The abdominal aorta is normal in caliber with moderate atherosclerotic change. No adenopathy is seen. The urinary bladder is not optimally distended but no abnormality is seen. The urinary bladder is slightly thick walled, most likely due to a degree of bladder outlet obstruction caused by a significant enlarged prostate measuring 6.5 x 6.2 cm. The colon is largely decompressed and no obvious colonic mass is seen. Areas of contraction i.e. in the distal transverse colon near the splenic flexure of the could conceivably represent a colonic lesion, but this area is poorly distended. It is noted that within the ascending colon near the hepatic flexure of colon there is and apparent lipoma of 17 mm in diameter with attenuation of -5 age U. scattered diverticula present within the rectosigmoid colon. Lumbar vertebrae are normal alignment with  diffuse degenerative disc disease throughout the entire lumbar spine. Also there is significant degenerative change involving the facet joints of the entire lumbar spine. IMPRESSION: 1. Cardiomegaly, small pericardial effusion, small bilateral pleural effusions, and somewhat prominent interstitial markings suggest mild interstitial edema. 2. No evidence of metastatic involvement of the chest, abdomen, or pelvis. 3. The colon is largely decompressed. No obvious mass is seen other than an apparent lipoma within the ascending colon of 17 mm in diameter. 4. There is an area of poor distension of the distal transverse colon near the splenic flexure and a colonic lesion cannot be excluded in that region. 5. 5 mm left lower pole renal calculus without obstruction. Electronically Signed   By: Ivar Drape M.D.   On: 11/08/2015 08:20   Dg Abd Portable 1v  11/05/2015  CLINICAL DATA:  75 year old male with history of colonic neoplasm. EXAM: PORTABLE ABDOMEN - 1 VIEW COMPARISON:  No priors. FINDINGS: Several nondilated gas-filled loops of small bowel are noted throughout the central abdomen. No pathologic dilatation of small bowel or colon. No gross evidence of pneumoperitoneum on these supine images. Clips are seen projecting over the left side of the epigastric region and overlying the right upper quadrant. IMPRESSION: 1. Nonspecific, nonobstructive bowel gas pattern. 2. No pneumoperitoneum. Electronically Signed   By: Vinnie Langton M.D.   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 Patient received 8 units of PRBC and IV iron in the hospital recently. Hgb stable. B12 borderline low Post-operative CEA WNL 3) HTN 4) DM2 5) Non ischemic chronic diastolic CHF 6) Pa fib on  Xarelto per cardiology 7) CKD  3 8) Duodenal ulceration (h pylori -positive) - completed Abx for rx. On PPI PLAN -patient continues to heal from his left colectomy -will need to continue f/u with surgery team to monitor his wound. -we discussed diagnosis , prognosis, indication and choices/toxicities of adjuvant treatment options. -given his CKD we would prefer 5FU to capecitabine. -would plan to treat with adjuvant FOLFOX for 6 months if tolerated once he heals completely from surgery -will recommend replacing B12/Bcomplex and continue po iron. -continue to maintain ambulation and good po intake -continue f/uw with PCP for management of other medical problems. All of the patients questions were answered with apparent satisfaction. The patient knows to call the clinic with any problems, questions or concerns.  RTC with Dr Irene Limbo in 2weeks with rpt labs. Will then need port (will ask the surgeon for port placement) and need to be setup for chemotherapy.  I spent 60 minutes counseling the patient face to face. The total time spent in the appointment was 60 minutes and more than 50% was on counseling and direct patient cares.    Sullivan Lone MD [place] AAHIVMS [facility] Hematology/Oncology Physician [facility] [place]  (Office):       [phone] (Work cell):  [phone] (Fax):           [phone]  11/21/2015 2:17 PM

## 2015-11-21 NOTE — Telephone Encounter (Signed)
Gave pt apt & avs °

## 2015-11-22 ENCOUNTER — Telehealth: Payer: Self-pay

## 2015-11-22 ENCOUNTER — Other Ambulatory Visit: Payer: Self-pay | Admitting: Cardiovascular Disease

## 2015-11-22 ENCOUNTER — Other Ambulatory Visit (HOSPITAL_BASED_OUTPATIENT_CLINIC_OR_DEPARTMENT_OTHER): Payer: Medicare Other

## 2015-11-22 ENCOUNTER — Encounter (HOSPITAL_COMMUNITY): Payer: Self-pay

## 2015-11-22 ENCOUNTER — Telehealth: Payer: Self-pay | Admitting: Cardiovascular Disease

## 2015-11-22 DIAGNOSIS — D509 Iron deficiency anemia, unspecified: Secondary | ICD-10-CM | POA: Diagnosis not present

## 2015-11-22 DIAGNOSIS — C189 Malignant neoplasm of colon, unspecified: Secondary | ICD-10-CM

## 2015-11-22 LAB — IRON AND TIBC
%SAT: 10 % — AB (ref 20–55)
IRON: 24 ug/dL — AB (ref 42–163)
TIBC: 244 ug/dL (ref 202–409)
UIBC: 219 ug/dL (ref 117–376)

## 2015-11-22 LAB — CBC & DIFF AND RETIC
BASO%: 0.6 % (ref 0.0–2.0)
BASOS ABS: 0 10*3/uL (ref 0.0–0.1)
EOS%: 1.4 % (ref 0.0–7.0)
Eosinophils Absolute: 0.1 10*3/uL (ref 0.0–0.5)
HEMATOCRIT: 29.6 % — AB (ref 38.4–49.9)
HGB: 9.4 g/dL — ABNORMAL LOW (ref 13.0–17.1)
Immature Retic Fract: 14.6 % — ABNORMAL HIGH (ref 3.00–10.60)
LYMPH%: 15 % (ref 14.0–49.0)
MCH: 23.3 pg — AB (ref 27.2–33.4)
MCHC: 31.8 g/dL — AB (ref 32.0–36.0)
MCV: 73.4 fL — AB (ref 79.3–98.0)
MONO#: 0.5 10*3/uL (ref 0.1–0.9)
MONO%: 7.5 % (ref 0.0–14.0)
NEUT#: 4.9 10*3/uL (ref 1.5–6.5)
NEUT%: 75.5 % — AB (ref 39.0–75.0)
PLATELETS: 380 10*3/uL (ref 140–400)
RBC: 4.03 10*6/uL — ABNORMAL LOW (ref 4.20–5.82)
RDW: 23.9 % — ABNORMAL HIGH (ref 11.0–14.6)
RETIC CT ABS: 44.73 10*3/uL (ref 34.80–93.90)
Retic %: 1.11 % (ref 0.80–1.80)
WBC: 6.5 10*3/uL (ref 4.0–10.3)
lymph#: 1 10*3/uL (ref 0.9–3.3)

## 2015-11-22 LAB — COMPREHENSIVE METABOLIC PANEL
ALT: 18 U/L (ref 0–55)
ANION GAP: 8 meq/L (ref 3–11)
AST: 17 U/L (ref 5–34)
Albumin: 2.7 g/dL — ABNORMAL LOW (ref 3.5–5.0)
Alkaline Phosphatase: 62 U/L (ref 40–150)
BILIRUBIN TOTAL: 0.57 mg/dL (ref 0.20–1.20)
BUN: 12.5 mg/dL (ref 7.0–26.0)
CALCIUM: 9.8 mg/dL (ref 8.4–10.4)
CHLORIDE: 108 meq/L (ref 98–109)
CO2: 25 mEq/L (ref 22–29)
CREATININE: 1.5 mg/dL — AB (ref 0.7–1.3)
EGFR: 53 mL/min/{1.73_m2} — AB (ref >90)
Glucose: 148 mg/dl — ABNORMAL HIGH (ref 70–140)
Potassium: 3.9 mEq/L (ref 3.5–5.1)
Sodium: 141 mEq/L (ref 136–145)
Total Protein: 6.9 g/dL (ref 6.4–8.3)

## 2015-11-22 LAB — TECHNOLOGIST REVIEW

## 2015-11-22 LAB — FERRITIN: FERRITIN: 415 ng/mL — AB (ref 22–316)

## 2015-11-22 NOTE — Telephone Encounter (Signed)
Called patient. Patient had been taking Xarelto (not Eliquis) and will restart Xarelto on 6/26. He sees Ignacia Bayley, NP on 6/27 at 1:30p. Patient verbalized understanding and agreed with plan.

## 2015-11-22 NOTE — Telephone Encounter (Signed)
-----   Message from Sanda Klein, MD sent at 11/21/2015  5:41 PM EDT ----- Dalene Seltzer please have him come in to follow-up with APP in the next 2 weeks.  Chelley, please tell him I would like him to restart Eliquis around June 26. That should provide enough time for both his ulcers and the colectomy to be fully healed and not bleed. Hopefully he will have already seen an app by then. He should call us if the swelling does not begin to improve in the next 2-3 days (Dr. Redmond Pulling told him to restart furosemide and KCl and I agree).  MCr ----- Message -----    From: Greer Pickerel, MD    Sent: 11/21/2015   5:01 PM      To: Sanda Klein, MD, Ammie Eversole  [facility],  Just saw this patient in follow up after left colectomy. i think he needs to see earlier than mid July. He hasn't been taking his lasix or eliquis. He has some lower extremity edema so i told to go back on his lasix and K. i told him i was fine with eliquis but he wanted you to weigh in.   Thanks Park Breed M. Redmond Pulling, MD, FACS General, Bariatric, & Minimally Invasive Surgery [facility], [place]

## 2015-11-23 LAB — VITAMIN B12: Vitamin B12: 276 pg/mL (ref 211–946)

## 2015-11-23 LAB — CEA: CEA1: 0.9 ng/mL (ref 0.0–4.7)

## 2015-11-23 NOTE — Telephone Encounter (Signed)
Closed encounter °

## 2015-11-28 ENCOUNTER — Encounter: Payer: Self-pay | Admitting: Cardiology

## 2015-11-29 ENCOUNTER — Other Ambulatory Visit: Payer: Self-pay | Admitting: Cardiovascular Disease

## 2015-11-29 MED ORDER — B COMPLEX VITAMINS PO CAPS: 1.0000 | ORAL_CAPSULE | Freq: Every day | ORAL | Status: DC

## 2015-12-05 ENCOUNTER — Ambulatory Visit (INDEPENDENT_AMBULATORY_CARE_PROVIDER_SITE_OTHER): Payer: Medicare Other | Admitting: Nurse Practitioner

## 2015-12-05 ENCOUNTER — Encounter: Payer: Self-pay | Admitting: Nurse Practitioner

## 2015-12-05 VITALS — BP 126/67 | HR 80 | Ht 75.0 in | Wt 244.2 lb

## 2015-12-05 DIAGNOSIS — I5032 Chronic diastolic (congestive) heart failure: Secondary | ICD-10-CM

## 2015-12-05 DIAGNOSIS — I10 Essential (primary) hypertension: Secondary | ICD-10-CM | POA: Diagnosis not present

## 2015-12-05 DIAGNOSIS — K922 Gastrointestinal hemorrhage, unspecified: Secondary | ICD-10-CM | POA: Diagnosis not present

## 2015-12-05 DIAGNOSIS — C189 Malignant neoplasm of colon, unspecified: Secondary | ICD-10-CM | POA: Insufficient documentation

## 2015-12-05 DIAGNOSIS — E785 Hyperlipidemia, unspecified: Secondary | ICD-10-CM | POA: Diagnosis not present

## 2015-12-05 DIAGNOSIS — I480 Paroxysmal atrial fibrillation: Secondary | ICD-10-CM | POA: Diagnosis not present

## 2015-12-05 DIAGNOSIS — Z8679 Personal history of other diseases of the circulatory system: Secondary | ICD-10-CM | POA: Insufficient documentation

## 2015-12-05 NOTE — Patient Instructions (Signed)
Your physician recommends that you return for lab work in: 1 month.  Your physician recommends that you schedule a follow-up appointment in: 3 months with Dr Sallyanne Kuster.

## 2015-12-05 NOTE — Progress Notes (Signed)
Office Visit    Patient Name: Ernest Lara Date of Encounter: 12/05/2015  Primary Care Provider:  [name], MD Primary Cardiologist:  Jerilynn Mages. Croitoru, MD   Chief Complaint    75 year old male with a prior history of paroxysmal atrial fibrillation, hypertension, diastolic dysfunction, complete heart block status post CRT-P, and recent diagnosis of colon cancer in the setting of significant GI bleed requiring transfusions and subsequent laparoscopic left colectomy, who presents for follow-up.  Past Medical History    Past Medical History  Diagnosis Date  . Hypertensive heart disease   . Type 2 diabetes mellitus ([place])   . OSA (obstructive sleep apnea), pt with apnea during procedures 10/11/2011  . Complete heart block ([place])     s. 05/02/2015 s/p BSX U128 [place] CRT-P Beckie Salts).  . Paroxysmal atrial fibrillation ([facility])     a. CHA2DS2VASc = 2-3 (Xarelto).  . History of cardiomyopathy     a. Nonischemic, possibly tachycardia mediated;  b. 04/2015 Echo:  EF 60-65%, no rwma, mild AI/MR, mod dil LA/RA, PASP 17mHg.  . CKD (chronic kidney disease) stage 3, GFR 30-59 ml/min   . GIB (gastrointestinal bleeding)     a. 10/2015- Hgb 4.6-->8u PRBC's;  b. 10/2015 EGD: non bleeding H pylori + ulceration; c. 10/2015 Colonoscopy: invasive adenocarcinoma.  . Stage IIIB Colon Cancer     a. 11/2015 Colonscopy: invasive adenocarcinoma;  b. 11/2015 s/p lap L colectomy; c. Pathology: pT3, pN1c Mx, MSI stable.   Past Surgical History  Procedure Laterality Date  . No past surgeries    . Tee without cardioversion  10/10/2011    Procedure: TRANSESOPHAGEAL ECHOCARDIOGRAM (TEE);  Surgeon: MSanda Klein MD;  [place] [facility]  Service: Cardiovascular;  Laterality: N/A;  . Cardioversion  10/11/2011    Procedure: CARDIOVERSION;  Surgeon: DLeonie Man MD;  [place] [facility]  Service: Cardiovascular;  Laterality: N/A;  . Nuclear stress  11/01/2011    Severe global hypokinesis,dilated ventricle  . Ep  implantable device N/A 05/02/2015    Procedure: BiV Pacemaker Insertion CRT-P;  Surgeon: GEvans Lance MD;  [facility];  Service: Cardiovascular;  Laterality: N/A;  . Esophagogastroduodenoscopy N/A 11/05/2015    Procedure: ESOPHAGOGASTRODUODENOSCOPY (EGD);  Surgeon: RRonald Lobo MD;  [facility];  Service: Endoscopy;  Laterality: N/A;  . Colonoscopy N/A 11/05/2015    Procedure: COLONOSCOPY;  Surgeon: RRonald Lobo MD;  [facility];  Service: Endoscopy;  Laterality: N/A;  . Laparoscopic partial colectomy N/A 11/10/2015    Procedure: LAPAROSCOPIC LEFT COLECTOMY LAPAROSCOPIC MOBILIZATION OF SPLENIC FLEXURE;  Surgeon: EGreer Pickerel MD;  [place] [facility]  Service: General;  Laterality: N/A;    Allergies  Allergies  Allergen Reactions  . Amiodarone Shortness Of Breath    PULMONARY TOXICITY with AMIODARONE  . 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 He was recently hospitalized in setting of profound fatigue and anemia with a hemoglobin of 4.6. He required 8 units of packed red blood cells. His Xarelto was held and he was evaluated with GI with EGD revealing nonbleeding ulcer and colonoscopy revealing adenocarcinoma. He subsequently underwent laparoscopic left colectomy. He has follow-up with oncology with plans for chemotherapy once he is further out from surgery. He is now back on Xarelto as  of yesterday. He has not been having any dark stools or blood in his stools. His exercise tolerance is greatly improved since prior to his hospitalization. He denies chest pain, palpitations, dyspnea, PND, orthopnea, dizziness, syncope, edema, or early satiety.  Home Medications      Prior to Admission medications   Medication Sig Start Date End Date Taking? Authorizing Provider  b complex vitamins capsule Take 1 capsule by mouth daily. 11/29/15  Yes Brunetta Genera, MD  benazepril (LOTENSIN) 40 MG tablet TAKE 1 TABLET (40 MG TOTAL) BY MOUTH DAILY. 11/22/15  Yes [name], MD  carvedilol (COREG) 25 MG tablet TAKE A HALF TABLET (12.5 MG) EVERY MORNING AND A WHOLE TABLET (25 MG) EVERY EVENING 08/25/15  Yes [name], MD  doxazosin (CARDURA) 8 MG tablet TAKE 1 TABLET (8 MG TOTAL) BY MOUTH AT BEDTIME. 07/20/15  Yes [name], MD  ferrous sulfate 325 (65 FE) MG EC tablet Take 1 tablet (325 mg total) by mouth daily with breakfast. 11/15/15  Yes Clanford L Johnson, MD  pantoprazole (PROTONIX) 40 MG tablet Take 1 tablet (40 mg total) by mouth 2 (two) times daily. 11/15/15  Yes Clanford Marisa Hua, MD  rosuvastatin (CRESTOR) 20 MG tablet TAKE 1 TABLET (20 MG TOTAL) BY MOUTH AT BEDTIME. 10/23/15  Yes Evans Lance, MD  sitaGLIPtin (JANUVIA) 100 MG tablet Take 100 mg by mouth daily.   Yes Historical Provider, MD  XARELTO 20 MG TABS tablet TAKE 1 TABLET BY MOUTH DAILY 11/22/15  Yes Sanda Klein, MD    Review of Systems    As above, he has been doing well. He denies chest pain, palpitations, dyspnea, pnd, orthopnea, n, v, dizziness, syncope, edema, weight gain, or early satiety.  All other systems reviewed and are otherwise negative except as noted above.  Physical Exam    VS:  BP 126/67 mmHg  Pulse 80  Ht _0  (1.905 m)  Wt 244 lb 3.2 oz (110.768 kg)  BMI 30.52 kg/m2 , BMI Body mass index is 30.52 kg/(m^2). GEN: Well nourished, well developed, in no acute distress. HEENT: normal. Neck: Supple, no JVD, carotid bruits, or masses. Cardiac: RRR, no murmurs, rubs, or gallops. No clubbing, cyanosis, edema.  Radials/DP/PT 2+ and equal bilaterally.  Respiratory:  Respirations regular and unlabored, clear to auscultation bilaterally. GI: Soft, nontender, nondistended, BS + x  4. MS: no deformity or atrophy. Skin: warm and dry, no rash. Neuro:  Strength and sensation are intact. Psych: Normal affect.  Accessory Clinical Findings    None  Assessment & Plan    1.  Paroxysmal atrial fibrillation: He has been doing well without palpitations or tachycardia. He recently required holding of his Xarelto in setting of GI bleed and adenocarcinoma requiring left laparoscopic colectomy. He just resume Xarelto yesterday. He has not been having any dark stools or blood in his stools. I will arrange for a follow-up CBC in 1 week.  2. GI bleed/adenocarcinoma of the colon status post laparoscopic Left colectomy: Doing well following surgery. His site is healing well and he is changing his dressings at home. No significant discharge. He has follow-up with surgery once and will see him again in a few weeks. He has also followed up with oncology and per notes, there is a plan for chemotherapy going forward. I will follow-up his CBC in 1 week as he resumed Xarelto yesterday.  3. Chronic diastolic just heart failure: Volume is stable. Heart rate and blood pressure stable.  4. Essential hypertension:  Blood pressure stable. Continue beta blocker, ACE inhibitor, and Cardura.  5. Hyperlipidemia: Continue statin therapy.   6. Complete heart block: Status post CRT-P.  7. DM II:  On Januvia per primary care.  8.  Disposition: Follow-up CBC next week. Follow-up with Dr. Sallyanne Kuster in approximately 3 months or sooner if necessary.   Murray Hodgkins, NP 12/05/2015, 2:37 PM

## 2015-12-06 ENCOUNTER — Ambulatory Visit (HOSPITAL_BASED_OUTPATIENT_CLINIC_OR_DEPARTMENT_OTHER): Payer: Medicare Other

## 2015-12-06 ENCOUNTER — Other Ambulatory Visit: Payer: Medicare Other

## 2015-12-06 ENCOUNTER — Ambulatory Visit (HOSPITAL_BASED_OUTPATIENT_CLINIC_OR_DEPARTMENT_OTHER): Payer: Medicare Other | Admitting: Hematology

## 2015-12-06 ENCOUNTER — Telehealth: Payer: Self-pay | Admitting: Hematology

## 2015-12-06 ENCOUNTER — Encounter: Payer: Self-pay | Admitting: Hematology

## 2015-12-06 VITALS — BP 111/58 | HR 72 | Temp 98.1°F | Resp 18 | Ht 75.0 in | Wt 243.4 lb

## 2015-12-06 DIAGNOSIS — E538 Deficiency of other specified B group vitamins: Secondary | ICD-10-CM | POA: Diagnosis not present

## 2015-12-06 DIAGNOSIS — I5032 Chronic diastolic (congestive) heart failure: Secondary | ICD-10-CM

## 2015-12-06 DIAGNOSIS — C184 Malignant neoplasm of transverse colon: Secondary | ICD-10-CM

## 2015-12-06 DIAGNOSIS — Z7901 Long term (current) use of anticoagulants: Secondary | ICD-10-CM

## 2015-12-06 DIAGNOSIS — N183 Chronic kidney disease, stage 3 (moderate): Secondary | ICD-10-CM

## 2015-12-06 DIAGNOSIS — I10 Essential (primary) hypertension: Secondary | ICD-10-CM

## 2015-12-06 DIAGNOSIS — D509 Iron deficiency anemia, unspecified: Secondary | ICD-10-CM

## 2015-12-06 DIAGNOSIS — I480 Paroxysmal atrial fibrillation: Secondary | ICD-10-CM

## 2015-12-06 DIAGNOSIS — E119 Type 2 diabetes mellitus without complications: Secondary | ICD-10-CM

## 2015-12-06 LAB — COMPREHENSIVE METABOLIC PANEL
ALBUMIN: 3.4 g/dL — AB (ref 3.5–5.0)
ALK PHOS: 84 U/L (ref 40–150)
ALT: 10 U/L (ref 0–55)
AST: 15 U/L (ref 5–34)
Anion Gap: 11 mEq/L (ref 3–11)
BILIRUBIN TOTAL: 0.76 mg/dL (ref 0.20–1.20)
BUN: 25.4 mg/dL (ref 7.0–26.0)
CALCIUM: 10.6 mg/dL — AB (ref 8.4–10.4)
CO2: 29 mEq/L (ref 22–29)
CREATININE: 1.7 mg/dL — AB (ref 0.7–1.3)
Chloride: 102 mEq/L (ref 98–109)
EGFR: 44 mL/min/{1.73_m2} — ABNORMAL LOW (ref >90)
GLUCOSE: 166 mg/dL — AB (ref 70–140)
Potassium: 4.1 mEq/L (ref 3.5–5.1)
Sodium: 142 mEq/L (ref 136–145)
TOTAL PROTEIN: 7.8 g/dL (ref 6.4–8.3)

## 2015-12-06 LAB — CBC & DIFF AND RETIC
BASO%: 0.3 % (ref 0.0–2.0)
BASOS ABS: 0 10*3/uL (ref 0.0–0.1)
EOS ABS: 0.1 10*3/uL (ref 0.0–0.5)
EOS%: 2 % (ref 0.0–7.0)
HEMATOCRIT: 33.5 % — AB (ref 38.4–49.9)
HEMOGLOBIN: 10.7 g/dL — AB (ref 13.0–17.1)
IMMATURE RETIC FRACT: 13.4 % — AB (ref 3.00–10.60)
LYMPH%: 21.2 % (ref 14.0–49.0)
MCH: 23.5 pg — AB (ref 27.2–33.4)
MCHC: 31.9 g/dL — ABNORMAL LOW (ref 32.0–36.0)
MCV: 73.6 fL — ABNORMAL LOW (ref 79.3–98.0)
MONO#: 0.4 10*3/uL (ref 0.1–0.9)
MONO%: 7.3 % (ref 0.0–14.0)
NEUT#: 4.2 10*3/uL (ref 1.5–6.5)
NEUT%: 69.2 % (ref 39.0–75.0)
Platelets: 232 10*3/uL (ref 140–400)
RBC: 4.55 10*6/uL (ref 4.20–5.82)
RDW: 22.9 % — AB (ref 11.0–14.6)
RETIC %: 0.89 % (ref 0.80–1.80)
Retic Ct Abs: 40.5 10*3/uL (ref 34.80–93.90)
WBC: 6 10*3/uL (ref 4.0–10.3)
lymph#: 1.3 10*3/uL (ref 0.9–3.3)

## 2015-12-06 NOTE — Progress Notes (Signed)
Marland Kitchen    HEMATOLOGY/ONCOLOGY CONSULTATION NOTE  Date of Service: 12/06/2015  Patient Care Team: [name], MD as PCP - General (Endocrinology)  CHIEF COMPLAINTS/PURPOSE OF CONSULTATION:  Stage III Colon Cancer  HISTORY OF PRESENTING ILLNESS:   Ernest Lara is a wonderful 75 y.o. male who has been referred to [place] by Dr .[name], MD for evaluation and management of Stage III colon cancer.  Patient has a h/o HTN, DM2, OSA , CHB s/p PPM, P afib (on anticoagulation), h/o chronic diastolic Non-ischemic cardiomyopathy, CKD stage 3 who recently presented to the hospital with acute GI bleeding requiring 8 units of PRBC's and 2 units of FFP. He also received IV iron for iron deficiency. He has a GI workup. EGD showed non bleeding helicobacter pylori positive ulceration (patient has completed ABx treatment for this). 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 Patient had a laparoscopic left colectomy.on 11/10/2015. He has been doing well post-operatively but has some superficial open areas at the incision site which have not yet healed. He has a CT chest/abd/pelvis in the hospital that did not show any evidence of distal metastatic disease. Pathology results from the surgical sample showed pT3, pN1c Mx (Stage IIIB) colon cancer MSI stable. Patient is here to discuss adjuvant treatment options. His anticoagulation for on hold for surgery and he is back on Xarelto for his P Afib per cardiology. Notes no neuropathy from his diabetes.   INTERVAL HISTORY  Patient is here for his scheduled every week follow-up and is about about 4 weeks from his colon surgery. His surgical incision is still not completely closed up at this time. He is following with his surgical clinic for wound dressings. Wound looks better but still not completely closed. No fevers or chills. No signs of wound infection. We discussed getting repeat labs to see how his  blood counts are improving with iron replacement. He reports regular bowel habits with no overt GI bleeding. Good oral intake. Overall feels better. He was accompanied by his son for the visit. We went over the chemotherapy option again and talked about her medications in more detail. We discussed that he would become another 4 weeks to allow the surgical wound to heal completely prior to considering adjuvant chemotherapy. Once his wound heals he will also need a port for his FOLFOX chemotherapy and that we shall ask Dr. Redmond Pulling if he might be able to do that or if he would like [place] to consult IR for this. Given printout regarding the medication regimen recommended for his adjuvant therapy.  MEDICAL HISTORY:  Past Medical History  Diagnosis Date  . Hypertensive heart disease   . Type 2 diabetes mellitus ([place])   . OSA (obstructive sleep apnea), pt with apnea during procedures 10/11/2011  . Complete heart block ([place])     s. 05/02/2015 s/p BSX U128 [place] CRT-P Beckie Salts).  . Paroxysmal atrial fibrillation ([facility])     a. CHA2DS2VASc = 2-3 (Xarelto).  . History of cardiomyopathy     a. Nonischemic, possibly tachycardia mediated;  b. 04/2015 Echo:  EF 60-65%, no rwma, mild AI/MR, mod dil LA/RA, PASP 58mHg.  . CKD (chronic kidney disease) stage 3, GFR 30-59 ml/min   . GIB (gastrointestinal bleeding)     a. 10/2015- Hgb 4.6-->8u PRBC's;  b. 10/2015 EGD: non bleeding H pylori + ulceration; c. 10/2015 Colonoscopy: invasive adenocarcinoma.  . Stage IIIB Colon Cancer     a. 11/2015  Colonscopy: invasive adenocarcinoma;  b. 11/2015 s/p lap L colectomy; c. Pathology: pT3, pN1c Mx, MSI stable.    SURGICAL HISTORY: Past Surgical History  Procedure Laterality Date  . No past surgeries    . Tee without cardioversion  10/10/2011    Procedure: TRANSESOPHAGEAL ECHOCARDIOGRAM (TEE);  Surgeon: Sanda Klein, MD;  Location: [place];  Service: Cardiovascular;  Laterality: N/A;  . Cardioversion  10/11/2011     Procedure: CARDIOVERSION;  Surgeon: Leonie Man, MD;  Location: [place];  Service: Cardiovascular;  Laterality: N/A;  . Nuclear stress  11/01/2011    Severe global hypokinesis,dilated ventricle  . Ep implantable device N/A 05/02/2015    Procedure: BiV Pacemaker Insertion CRT-P;  Surgeon: Evans Lance, MD;  Location: [place] [facility];  Service: Cardiovascular;  Laterality: N/A;  . Esophagogastroduodenoscopy N/A 11/05/2015    Procedure: ESOPHAGOGASTRODUODENOSCOPY (EGD);  Surgeon: Ronald Lobo, MD;  Location: [facility] ENDOSCOPY;  Service: Endoscopy;  Laterality: N/A;  . Colonoscopy N/A 11/05/2015    Procedure: COLONOSCOPY;  Surgeon: Ronald Lobo, MD;  Location: [facility] ENDOSCOPY;  Service: Endoscopy;  Laterality: N/A;  . Laparoscopic partial colectomy N/A 11/10/2015    Procedure: LAPAROSCOPIC LEFT COLECTOMY LAPAROSCOPIC MOBILIZATION OF SPLENIC FLEXURE;  Surgeon: Greer Pickerel, MD;  Location: [place];  Service: General;  Laterality: N/A;    SOCIAL HISTORY: Social History   Social History  . Marital Status: Married    Spouse Name: N/A  . Number of Children: N/A  . Years of Education: N/A   Occupational History  . Not on file.   Social History Main Topics  . Smoking status: Never Smoker   . Smokeless tobacco: Never Used  . Alcohol Use: No  . Drug Use: No  . Sexual Activity: Not Currently   Other Topics Concern  . Not on file   Social History Narrative    FAMILY HISTORY: Family History  Problem Relation Age of Onset  . Colon cancer Mother   . Kidney disease Father     ESRF/HD, deceased    ALLERGIES:  is allergic to amiodarone and allopurinol.  MEDICATIONS:  Current Outpatient Prescriptions  Medication Sig Dispense Refill  . b complex vitamins capsule Take 1 capsule by mouth daily. 30 capsule 3  . benazepril (LOTENSIN) 40 MG tablet TAKE 1 TABLET (40 MG TOTAL) BY MOUTH DAILY. 30 tablet 6  . carvedilol (COREG) 25 MG tablet TAKE A HALF TABLET (12.5 MG) EVERY MORNING AND A WHOLE TABLET  (25 MG) EVERY EVENING 135 tablet 0  . doxazosin (CARDURA) 8 MG tablet TAKE 1 TABLET (8 MG TOTAL) BY MOUTH AT BEDTIME. 30 tablet 4  . ferrous sulfate 325 (65 FE) MG EC tablet Take 1 tablet (325 mg total) by mouth daily with breakfast. 30 tablet 0  . pantoprazole (PROTONIX) 40 MG tablet Take 1 tablet (40 mg total) by mouth 2 (two) times daily. 60 tablet 0  . rosuvastatin (CRESTOR) 20 MG tablet TAKE 1 TABLET (20 MG TOTAL) BY MOUTH AT BEDTIME. 30 tablet 10  . sitaGLIPtin (JANUVIA) 100 MG tablet Take 100 mg by mouth daily.    Alveda Reasons 20 MG TABS tablet TAKE 1 TABLET BY MOUTH DAILY 30 tablet 5   No current facility-administered medications for this visit.    REVIEW OF SYSTEMS:    10 Point review of Systems was done is negative except as noted above.  PHYSICAL EXAMINATION: ECOG PERFORMANCE STATUS: 2 - Symptomatic, <50% confined to bed  . Filed Vitals:   12/06/15 0919  BP:  111/58  Pulse: 72  Temp: 98.1 F (36.7 C)  Resp: 18   Filed Weights   12/06/15 0919  Weight: 243 lb 6.4 oz (110.406 kg)   .Body mass index is 30.42 kg/(m^2).  GENERAL:alert, in no acute distress and comfortable SKIN: skin color, texture, turgor are normal, no rashes or significant lesions EYES: normal, conjunctiva are pink and non-injected, sclera clear OROPHARYNX:no exudate, no erythema and lips, buccal mucosa, and tongue normal  NECK: supple, no JVD, thyroid normal size, non-tender, without nodularity LYMPH:  no palpable lymphadenopathy in the cervical, axillary or inguinal LUNGS: clear to auscultation with normal respiratory effort HEART: regular rate & rhythm,  no murmurs and no lower extremity edema ABDOMEN: healing surgical wounds with some open area with serous discharge, abdomen soft, non-tender, normoactive bowel sounds  Musculoskeletal: no cyanosis of digits and no clubbing  PSYCH: alert & oriented x 3 with fluent speech NEURO: no focal motor/sensory deficits  LABORATORY DATA:  I have reviewed the  data as listed  . CBC Latest Ref Rng 11/22/2015 11/14/2015 11/13/2015  WBC 4.0 - 10.3 10e3/uL 6.5 6.2 8.9  Hemoglobin 13.0 - 17.1 g/dL 9.4(L) 8.6(L) 9.2(L)  Hematocrit 38.4 - 49.9 % 29.6(L) 27.9(L) 30.4(L)  Platelets 140 - 400 10e3/uL 380 247 230   . CBC    Component Value Date/Time   WBC 6.5 11/22/2015 0952   WBC 6.2 11/14/2015 0617   RBC 4.03* 11/22/2015 0952   RBC 3.84* 11/14/2015 0617   RBC 2.62* 11/03/2015 1956   HGB 9.4* 11/22/2015 0952   HGB 8.6* 11/14/2015 0617   HCT 29.6* 11/22/2015 0952   HCT 27.9* 11/14/2015 0617   PLT 380 11/22/2015 0952   PLT 247 11/14/2015 0617   MCV 73.4* 11/22/2015 0952   MCV 72.7* 11/14/2015 0617   MCH 23.3* 11/22/2015 0952   MCH 22.4* 11/14/2015 0617   MCHC 31.8* 11/22/2015 0952   MCHC 30.8 11/14/2015 0617   RDW 23.9* 11/22/2015 0952   RDW 23.9* 11/14/2015 0617   LYMPHSABS 1.0 11/22/2015 0952   LYMPHSABS 2.1 05/01/2015 1832   MONOABS 0.5 11/22/2015 0952   MONOABS 0.4 05/01/2015 1832   EOSABS 0.1 11/22/2015 0952   EOSABS 0.1 05/01/2015 1832   BASOSABS 0.0 11/22/2015 0952   BASOSABS 0.0 05/01/2015 1832     . CMP Latest Ref Rng 11/22/2015 11/14/2015 11/13/2015  Glucose 70 - 140 mg/dl 148(H) 145(H) 191(H)  BUN 7.0 - 26.0 mg/dL 12.5 8 10   Creatinine 0.7 - 1.3 mg/dL 1.5(H) 1.45(H) 1.56(H)  Sodium 136 - 145 mEq/L 141 139 138  Potassium 3.5 - 5.1 mEq/L 3.9 4.1 4.2  Chloride 101 - 111 mmol/L - 107 111  CO2 22 - 29 mEq/L 25 23 22   Calcium 8.4 - 10.4 mg/dL 9.8 10.5(H) 10.1  Total Protein 6.4 - 8.3 g/dL 6.9 5.4(L) -  Total Bilirubin 0.20 - 1.20 mg/dL 0.57 1.0 -  Alkaline Phos 40 - 150 U/L 62 53 -  AST 5 - 34 U/L 17 21 -  ALT 0 - 55 U/L 18 17 -   Component     Latest Ref Rng 11/22/2015  Iron     42 - 163 ug/dL 24 (L)  TIBC     202 - 409 ug/dL 244  UIBC     117 - 376 ug/dL 219  %SAT     20 - 55 % 10 (L)  CEA     0.0 - 4.7 ng/mL 0.9  Ferritin     22 - 316 ng/ml  415 (H)  Vitamin B12     211 - 946 pg/mL 276      RADIOGRAPHIC  STUDIES: I have personally reviewed the radiological images as listed and agreed with the findings in the report. Ct Chest W Contrast  11/08/2015  CLINICAL DATA:  History of colon lesion, cardiomyopathy, chronic kidney disease, diabetes and hypertension EXAM: CT CHEST, ABDOMEN, AND PELVIS WITH CONTRAST TECHNIQUE: Multidetector CT imaging of the chest, abdomen and pelvis was performed following the standard protocol during bolus administration of intravenous contrast. CONTRAST:  176m ISOVUE-300 IOPAMIDOL (ISOVUE-300) INJECTION 61% COMPARISON:  Chest x-ray of 11/03/2015 FINDINGS: CT CHEST On lung window images, no evidence of lung metastasis is seen. There are small bilateral pleural effusions present. In addition there are foci of higher attenuation within both lower lobes posteriorly most suspicious for prior aspiration. There are somewhat prominent interstitial markings to the pleura as well which could represent mild changes of interstitial edema. There are degenerative changes throughout the thoracic spine, in the shoulders, and in the sternoclavicular joints. On lung window images, the thyroid gland contains a few small low-attenuation nodules of less than 1 cm in size, of doubtful clinical significance in this age patient. The thoracic aorta opacifies with no significant abnormality noted. The pulmonary arteries also are faintly opacify with no central abnormality evident. The heart is mildly enlarged and there is a small pericardial effusion present. Pacer wires are noted. CT ABDOMEN AND PELVIS The liver enhances with no focal abnormality and no ductal dilatation is seen. No calcified gallstones are noted. The pancreas is normal in size in the pancreatic duct is not dilated. There may be a small lipoma within the third portion of the duodenum. The adrenal glands and the spleen are unremarkable. The stomach is decompressed. There is a small left lower pole renal calculus of approximately 5 mm. No  hydronephrosis is seen. On delayed images the pelvocaliceal systems are unremarkable. Small low-attenuation structures are noted bilaterally involving both kidneys most consistent with small cysts but difficult to characterize on this study. Ultrasound may be helpful if further assessment is warranted clinically. The proximal ureters are normal in caliber. The abdominal aorta is normal in caliber with moderate atherosclerotic change. No adenopathy is seen. The urinary bladder is not optimally distended but no abnormality is seen. The urinary bladder is slightly thick walled, most likely due to a degree of bladder outlet obstruction caused by a significant enlarged prostate measuring 6.5 x 6.2 cm. The colon is largely decompressed and no obvious colonic mass is seen. Areas of contraction i.e. in the distal transverse colon near the splenic flexure of the could conceivably represent a colonic lesion, but this area is poorly distended. It is noted that within the ascending colon near the hepatic flexure of colon there is and apparent lipoma of 17 mm in diameter with attenuation of -820age U. scattered diverticula present within the rectosigmoid colon. 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 Also there is significant degenerative change involving the facet joints of the entire lumbar spine. IMPRESSION: 1. Cardiomegaly, small pericardial effusion, small bilateral pleural effusions, and somewhat prominent interstitial markings suggest mild interstitial edema. 2. No evidence of metastatic involvement of the chest, abdomen, or pelvis. 3. The colon is largely decompressed. No obvious mass is seen other than an apparent lipoma within the ascending colon of 17 mm in diameter. 4. There is an area of poor distension of the distal transverse colon near the  splenic flexure and a colonic lesion cannot be excluded in that region. 5. 5 mm left lower pole renal  calculus without obstruction. Electronically Signed   By: Ivar Drape M.D.   On: 11/08/2015 08:20   Ct Abdomen Pelvis W Contrast  11/08/2015  CLINICAL DATA:  History of colon lesion, cardiomyopathy, chronic kidney disease, diabetes and hypertension EXAM: CT CHEST, ABDOMEN, AND PELVIS WITH CONTRAST TECHNIQUE: Multidetector CT imaging of the chest, abdomen and pelvis was performed following the standard protocol during bolus administration of intravenous contrast. CONTRAST:  124m ISOVUE-300 IOPAMIDOL (ISOVUE-300) INJECTION 61% COMPARISON:  Chest x-ray of 11/03/2015 FINDINGS: CT CHEST On lung window images, no evidence of lung metastasis is seen. There are small bilateral pleural effusions present. In addition there are foci of higher attenuation within both lower lobes posteriorly most suspicious for prior aspiration. There are somewhat prominent interstitial markings to the pleura as well which could represent mild changes of interstitial edema. There are degenerative changes throughout the thoracic spine, in the shoulders, and in the sternoclavicular joints. On lung window images, the thyroid gland contains a few small low-attenuation nodules of less than 1 cm in size, of doubtful clinical significance in this age patient. The thoracic aorta opacifies with no significant abnormality noted. The pulmonary arteries also are faintly opacify with no central abnormality evident. The heart is mildly enlarged and there is a small pericardial effusion present. Pacer wires are noted. CT ABDOMEN AND PELVIS The liver enhances with no focal abnormality and no ductal dilatation is seen. No calcified gallstones are noted. The pancreas is normal in size in the pancreatic duct is not dilated. There may be a small lipoma within the third portion of the duodenum. The adrenal glands and the spleen are unremarkable. The stomach is decompressed. There is a small left lower pole renal calculus of approximately 5 mm. No hydronephrosis  is seen. On delayed images the pelvocaliceal systems are unremarkable. Small low-attenuation structures are noted bilaterally involving both kidneys most consistent with small cysts but difficult to characterize on this study. Ultrasound may be helpful if further assessment is warranted clinically. The proximal ureters are normal in caliber. The abdominal aorta is normal in caliber with moderate atherosclerotic change. No adenopathy is seen. The urinary bladder is not optimally distended but no abnormality is seen. The urinary bladder is slightly thick walled, most likely due to a degree of bladder outlet obstruction caused by a significant enlarged prostate measuring 6.5 x 6.2 cm. The colon is largely decompressed and no obvious colonic mass is seen. Areas of contraction i.e. in the distal transverse colon near the splenic flexure of the could conceivably represent a colonic lesion, but this area is poorly distended. It is noted that within the ascending colon near the hepatic flexure of colon there is and apparent lipoma of 17 mm in diameter with attenuation of -839age U. scattered diverticula present within the rectosigmoid colon. 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 Also there is significant degenerative change involving the facet joints of the entire lumbar spine. IMPRESSION: 1. Cardiomegaly, small pericardial effusion, small bilateral pleural effusions, and somewhat prominent interstitial markings suggest mild interstitial edema. 2. No evidence of metastatic involvement of the chest, abdomen, or pelvis. 3. The colon is largely decompressed. No obvious mass is seen other than an apparent lipoma within the ascending colon of 17 mm in diameter. 4. There is an area of poor distension of the distal transverse colon near  the splenic flexure and a colonic lesion cannot be excluded in that region. 5. 5 mm left lower pole renal calculus without  obstruction. Electronically Signed   By: Ivar Drape M.D.   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 Patient received 8 units of PRBC and IV iron in the hospital recently. Hgb stable. B12 borderline low Post-operative CEA WNL 3) HTN 4) DM2 5) Non ischemic chronic diastolic CHF 6) Pa fib on Xarelto per cardiology 7) CKD 3 8) Duodenal ulceration (h pylori -positive) - completed Abx for rx. On PPI 9) abdominal wound from his recent laparoscopic colectomy - not completely closed yet. Appears to be healing with no signs of infection at this time. PLAN -patient continues to heal from his left colectomy. No evidence of infection but the wound has not close yet. We would prefer that this has healed up completely prior to starting adjuvant therapy which might affect the way his wound heals. -continue f/u with surgery team to monitor his wound. Will defer to his surgeon regarding decision about whether they would like to have this heal by primary intention versus secondary intention. -He will need port placement. We will request surgeons to consider this alternatively can be done by interventional radiology. -would plan to treat with adjuvant FOLFOX for 6 months if tolerated once he heals completely from surgery -continue po replacement of B12/Bcomplex and continue po iron. -continue to maintain ambulation and good po intake -continue f/u with PCP for management of other medical problems.  We shall get repeat labs to monitor his blood counts today We shall see him back in 4 weeks with a repeat CBC, CMP, ferritin and B12 If his wound is healed on follow-up we will set him up for chemotherapy counseling and starting his adjuvant FOLFOX chemotherapy.  All of the  patients and his sons questions were answered with apparent satisfaction. The patient knows to call the clinic with any problems, questions or concerns.  RTC with Dr Irene Limbo in 4 weeks with rpt labs. Will then need port (will ask the surgeon for port placement) and need to be setup for chemotherapy.  I spent 25 minutes counseling the patient face to face. The total time spent in the appointment was 25 minutes and more than 50% was on counseling and direct patient cares.    Sullivan Lone MD [place] AAHIVMS [facility] Hematology/Oncology Physician [facility]  (Office):       [phone] (Work cell):  [phone] (Fax):           [phone]  12/06/2015 10:17 AM

## 2015-12-06 NOTE — Telephone Encounter (Signed)
per pof to sch pt appt-gave pt copy of avs °

## 2015-12-14 ENCOUNTER — Encounter: Payer: Self-pay | Admitting: Cardiology

## 2015-12-22 ENCOUNTER — Other Ambulatory Visit: Payer: Self-pay | Admitting: Cardiovascular Disease

## 2015-12-22 ENCOUNTER — Ambulatory Visit: Payer: Self-pay | Admitting: General Surgery

## 2015-12-22 NOTE — Telephone Encounter (Signed)
Rx has been sent to the pharmacy electronically. ° °

## 2015-12-25 ENCOUNTER — Encounter (HOSPITAL_COMMUNITY): Payer: Self-pay

## 2015-12-25 ENCOUNTER — Encounter (HOSPITAL_COMMUNITY)
Admission: RE | Admit: 2015-12-25 | Discharge: 2015-12-25 | Disposition: A | Discharge status: Home / Self Care | Payer: Medicare Other | Source: Ambulatory Visit | Attending: General Surgery | Admitting: General Surgery

## 2015-12-25 DIAGNOSIS — Z01812 Encounter for preprocedural laboratory examination: Secondary | ICD-10-CM | POA: Insufficient documentation

## 2015-12-25 LAB — CBC WITH DIFFERENTIAL/PLATELET
BASOS ABS: 0 10*3/uL (ref 0.0–0.1)
Basophils Relative: 0 %
EOS PCT: 2 %
Eosinophils Absolute: 0.1 10*3/uL (ref 0.0–0.7)
HEMATOCRIT: 32.7 % — AB (ref 39.0–52.0)
HEMOGLOBIN: 10.5 g/dL — AB (ref 13.0–17.0)
LYMPHS PCT: 21 %
Lymphs Abs: 1.2 10*3/uL (ref 0.7–4.0)
MCH: 23.9 pg — ABNORMAL LOW (ref 26.0–34.0)
MCHC: 32.1 g/dL (ref 30.0–36.0)
MCV: 74.3 fL — AB (ref 78.0–100.0)
MONOS PCT: 8 %
Monocytes Absolute: 0.5 10*3/uL (ref 0.1–1.0)
NEUTROS PCT: 69 %
Neutro Abs: 4 10*3/uL (ref 1.7–7.7)
Platelets: 180 10*3/uL (ref 150–400)
RBC: 4.4 MIL/uL (ref 4.22–5.81)
RDW: 22.4 % — ABNORMAL HIGH (ref 11.5–15.5)
WBC: 5.8 10*3/uL (ref 4.0–10.5)

## 2015-12-25 LAB — BASIC METABOLIC PANEL
Anion gap: 6 (ref 5–15)
BUN: 23 mg/dL — ABNORMAL HIGH (ref 6–20)
CHLORIDE: 106 mmol/L (ref 101–111)
CO2: 26 mmol/L (ref 22–32)
Calcium: 9.8 mg/dL (ref 8.9–10.3)
Creatinine, Ser: 1.35 mg/dL — ABNORMAL HIGH (ref 0.61–1.24)
GFR, EST AFRICAN AMERICAN: 58 mL/min — AB (ref >60)
GFR, EST NON AFRICAN AMERICAN: 50 mL/min — AB (ref >60)
Glucose, Bld: 177 mg/dL — ABNORMAL HIGH (ref 65–99)
POTASSIUM: 3.9 mmol/L (ref 3.5–5.1)
SODIUM: 138 mmol/L (ref 135–145)

## 2015-12-25 LAB — PROTIME-INR
INR: 1.35 (ref 0.00–1.49)
Prothrombin Time: 16.3 seconds — ABNORMAL HIGH (ref 11.6–15.2)

## 2015-12-25 LAB — APTT: aPTT: 35 seconds (ref 24–37)

## 2015-12-25 NOTE — Patient Instructions (Signed)
COI GRIFFIE  12/25/2015   Your procedure is scheduled on: Friday 12/29/2015  Report to [facility] Main  Entrance take [place]  elevators to 3rd floor to  [place] at   [phone].  Call this number if you have problems the morning of surgery [phone]   Remember: ONLY 1 PERSON MAY GO WITH YOU TO SHORT STAY TO GET  READY MORNING OF [place].   Do not eat food or drink liquids :After Midnight.     Take these medicines the morning of surgery with A SIP OF WATER:  Carvedilol (Coreg), Doxazosin (Cardura), Pantoprazole (Protonix)              DO NOT TAKE ANY DIABETIC MEDICATIONS DAY OF YOUR SURGERY!                               You may not have any metal on your body including hair pins and              piercings  Do not wear jewelry, make-up, lotions, powders or perfumes, deodorant                         Men may shave face and neck.   Do not bring valuables to the hospital. [place].  Contacts, dentures or bridgework may not be worn into surgery.  Leave suitcase in the car. After surgery it may be brought to your room.     Patients discharged the day of surgery will not be allowed to drive home.  Name and phone number of your driver: son Ronalee Belts [phone]  Special Instructions: N/A              Please read over the following fact sheets you were given: _____________________________________________________________________             [facility] - Preparing for Surgery Before surgery, you can play an important role.  Because skin is not sterile, your skin needs to be as free of germs as possible.  You can reduce the number of germs on your skin by washing with CHG (chlorahexidine gluconate) soap before surgery.  CHG is an antiseptic cleaner which kills germs and bonds with the skin to continue killing germs even after washing. Please DO NOT use if you have an allergy to CHG or antibacterial  soaps.  If your skin becomes reddened/irritated stop using the CHG and inform your nurse when you arrive at Short Stay. Do not shave (including legs and underarms) for at least 48 hours prior to the first CHG shower.  You may shave your face/neck. Please follow these instructions carefully:  1.  Shower with CHG Soap the night before surgery and the  morning of Surgery.  2.  If you choose to wash your hair, wash your hair first as usual with your  normal  shampoo.  3.  After you shampoo, rinse your hair and body thoroughly to remove the  shampoo.                           4.  Use CHG as you would any other liquid soap.  You can apply chg  directly  to the skin and wash                       Gently with a scrungie or clean washcloth.  5.  Apply the CHG Soap to your body ONLY FROM THE NECK DOWN.   Do not use on face/ open                           Wound or open sores. Avoid contact with eyes, ears mouth and genitals (private parts).                       Wash face,  Genitals (private parts) with your normal soap.             6.  Wash thoroughly, paying special attention to the area where your surgery  will be performed.  7.  Thoroughly rinse your body with warm water from the neck down.  8.  DO NOT shower/wash with your normal soap after using and rinsing off  the CHG Soap.                9.  Pat yourself dry with a clean towel.            10.  Wear clean pajamas.            11.  Place clean sheets on your bed the night of your first shower and do not  sleep with pets. Day of Surgery : Do not apply any lotions/deodorants the morning of surgery.  Please wear clean clothes to the hospital/surgery center.  FAILURE TO FOLLOW THESE INSTRUCTIONS MAY RESULT IN THE CANCELLATION OF YOUR SURGERY PATIENT SIGNATURE_________________________________  NURSE SIGNATURE__________________________________  ________________________________________________________________________

## 2015-12-25 NOTE — Progress Notes (Signed)
11/03/15-noted in EPIC- EKG and 2 view CXR.

## 2015-12-26 ENCOUNTER — Other Ambulatory Visit (HOSPITAL_COMMUNITY): Payer: Medicare Other

## 2015-12-26 ENCOUNTER — Encounter: Payer: Medicare Other | Admitting: Cardiovascular Disease

## 2015-12-28 NOTE — Progress Notes (Signed)
Patient notified of time change in surgery for 12/29/2015 as patient informed to arrive at Short Stay at [address] [facility] [facility] at [phone] am for surgery at 1105 am. Patient instructed again nothing to eat or drink after midnihgt. Patient verbalized understanding.

## 2015-12-28 NOTE — H&P (Signed)
Ernest Lara 12/28/2015 8:40 AM Location: [facility] Patient #: [id number] DOB: 01/30/1941 Married / Language: English / Race: Black or African American Male  History of Present Illness Ernest Hiss M. Navin Dogan Ernest Lara; 12/28/2015 9:17 AM) The patient is a 75 year old male presenting for a post-operative visit. He comes in for follow-up after undergoing laparoscopic assisted left colectomy. He had been in the hospital from May 26 through June 7. He was initially admitted with a GI bleed. 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 He also had an unresectable rectosigmoid benign polyp on colonoscopy. Surgery was done on June 2. His oncologist, Dr Irene Limbo has recommended 6 months of chemotherapy and has requested a port a cath. His final tumor status was T3 N1b. He is accompanied by his son. He denies any fevers or chills. He denies any nausea or vomiting. He reports a good appetite. He reports good energy level. He denies any chest pain or shortness of breath. He has stopped his oral blood thinner for his port a cath insertion tomorrow.    Problem List/Past Medical Ernest Hiss Ronnie Derby, Ernest Lara; 12/28/2015 9:20 AM) DUODENAL ULCER, ACUTE (K26.3) POLYP OF RECTUM (K62.1) S/P LEFT COLECTOMY (Z90.49) H/O COLON CANCER, STAGE III [id number])  Other Problems Gayland Curry, Ernest Lara; 12/28/2015 9:19 AM) High blood pressure Sleep Apnea Transfusion history Colon Cancer Diabetes Mellitus Gastric Ulcer Atrial Fibrillation Back Pain  Past Surgical History Gayland Curry, Ernest Lara; 12/28/2015 9:19 AM) Cataract Surgery Bilateral. Colon Polyp Removal - Colonoscopy  Diagnostic Studies History Gayland Curry, Ernest Lara; 12/28/2015 9:19 AM) Colonoscopy within last year  Allergies Elbert Ewings, CMA; 12/28/2015 8:41 AM) No Known Drug Allergies 11/21/2015  Medication History Elbert Ewings, CMA; 12/28/2015 8:41 AM) Benazepril HCl (40MG  Tablet, Oral)  Active. Carvedilol (25MG  Tablet, Oral) Active. Doxazosin Mesylate (8MG  Tablet, Oral) Active. Ferrous Sulfate (325 (65 Fe)MG Tablet, Oral) Active. Januvia (100MG  Tablet, Oral) Active. Klor-Con M10 (10MEQ Tablet ER, Oral) Active. Pantoprazole Sodium (40MG  Tablet DR, Oral) Active. Rosuvastatin Calcium (20MG  Tablet, Oral) Active. Medications Reconciled  Social History Gayland Curry, Ernest Lara; 12/28/2015 9:19 AM) Alcohol use Remotely quit alcohol use. Caffeine use Carbonated beverages. No drug use Tobacco use Never smoker.  Family History Gayland Curry, Ernest Lara; 12/28/2015 9:19 AM) Diabetes Mellitus Father. Heart Disease Father. Hypertension Father. Kidney Disease Father.    Vitals Elbert Ewings CMA; 12/28/2015 8:42 AM) 12/28/2015 8:41 AM Weight: 250 lb Height: 75in Body Surface Area: 2.41 m Body Mass Index: 31.25 kg/m  Temp.: 97.27F(Temporal)  Pulse: 72 (Regular)  BP: 136/74 (Sitting, Left Arm, Standard)      Physical Exam Ernest Hiss M. Anija Brickner Ernest Lara; 12/28/2015 9:17 AM)  General Mental Status-Alert. General Appearance-Consistent with stated age. Hydration-Well hydrated. Voice-Normal.  Integumentary Note: no edema  Chest and Lung Exam Chest and lung exam reveals -quiet, even and easy respiratory effort with no use of accessory muscles. Inspection Chest Wall - Normal. Back - normal. Auscultation Breath sounds - Normal and Clear.  Abdomen Inspection Skin - Scar - Note: trocars incision: clean, dry, and intact; his left transverse upper abdominal incision is without cellulitis, induration or fluctuance. it has now closed. Palpation/Percussion Palpation and Percussion of the abdomen reveal - Soft, Non Tender, No Rebound tenderness and No Rigidity (guarding). Auscultation Auscultation of the abdomen reveals - Bowel sounds normal.  Alert, ox3  Assessment & Plan Ernest Hiss M. Ulises Wolfinger Ernest Lara; 12/28/2015 9:19 AM)  S/P LEFT COLECTOMY (Z90.49) Impression: He  is doing well  after his colon surgery. He is recovering well. He has good energy level. His abdominal incision is healed and therefore I think we can proceed with Port-A-Cath insertion tomorrow. We discussed the risk and benefits of Port-A-Cath insertion. We discussed the risk of bleeding, infection, injury to great vessels and lungs resulting in pneumothorax, malposition of the Port-A-Cath, need for additional procedures, as well as long-term infection risk. We discussed the risk of anesthesia. We discussed the typical recovery process. He was shown an example of a Port-A-Cath. All his questions were asked and answered. His incision is healed so he no longer needs to place a damp gauze over the wound. He will follow-up with me about 3 months after today.  Current Plans Pt Education - [employer] Free Text Education/Instructions: discussed with patient and provided information. H/O COLON CANCER, STAGE III (Z85.038)  POLYP OF RECTUM (K62.1) Impression: He was found to have a 3-4 cm benign polyp in the rectosigmoid area on colonoscopy during his hospitalization. Intraoperatively it was about an inch or 2 above the peritoneal reflection. This was at least a foot and a half to 2 feet away from his colonic neoplasm. I did not feel that it was safe to put him through an extended left colectomy. He will need endoscopic surveillance of this polyp. If it is still felt not to be a candidate for endoscopic resection in the future then a low anterior resection would be needed. He has a very redundant sigmoid colon.  Leighton Ruff. Redmond Pulling, Ernest Lara, Ernest Lara General, Bariatric, & Minimally Invasive Surgery [facility], [place]

## 2015-12-29 ENCOUNTER — Ambulatory Visit (HOSPITAL_COMMUNITY): Payer: Medicare Other | Admitting: Anesthesiology

## 2015-12-29 ENCOUNTER — Encounter (HOSPITAL_COMMUNITY)
Admission: RE | Disposition: A | Discharge status: Home / Self Care | Payer: Self-pay | Source: Ambulatory Visit | Attending: General Surgery

## 2015-12-29 ENCOUNTER — Encounter (HOSPITAL_COMMUNITY): Payer: Self-pay

## 2015-12-29 ENCOUNTER — Ambulatory Visit (HOSPITAL_COMMUNITY)
Admission: RE | Admit: 2015-12-29 | Discharge: 2015-12-29 | Disposition: A | Discharge status: Home / Self Care | Payer: Medicare Other | Source: Ambulatory Visit | Attending: General Surgery | Admitting: General Surgery

## 2015-12-29 ENCOUNTER — Ambulatory Visit (HOSPITAL_COMMUNITY): Payer: Medicare Other

## 2015-12-29 DIAGNOSIS — G4730 Sleep apnea, unspecified: Secondary | ICD-10-CM | POA: Diagnosis not present

## 2015-12-29 DIAGNOSIS — R0602 Shortness of breath: Secondary | ICD-10-CM

## 2015-12-29 DIAGNOSIS — Z79899 Other long term (current) drug therapy: Secondary | ICD-10-CM | POA: Diagnosis not present

## 2015-12-29 DIAGNOSIS — Z6831 Body mass index (BMI) 31.0-31.9, adult: Secondary | ICD-10-CM | POA: Insufficient documentation

## 2015-12-29 DIAGNOSIS — E669 Obesity, unspecified: Secondary | ICD-10-CM | POA: Insufficient documentation

## 2015-12-29 DIAGNOSIS — K621 Rectal polyp: Secondary | ICD-10-CM | POA: Diagnosis not present

## 2015-12-29 DIAGNOSIS — C189 Malignant neoplasm of colon, unspecified: Secondary | ICD-10-CM | POA: Diagnosis present

## 2015-12-29 DIAGNOSIS — Z7984 Long term (current) use of oral hypoglycemic drugs: Secondary | ICD-10-CM | POA: Diagnosis not present

## 2015-12-29 DIAGNOSIS — Z9049 Acquired absence of other specified parts of digestive tract: Secondary | ICD-10-CM | POA: Diagnosis not present

## 2015-12-29 DIAGNOSIS — I509 Heart failure, unspecified: Secondary | ICD-10-CM | POA: Diagnosis not present

## 2015-12-29 DIAGNOSIS — I110 Hypertensive heart disease with heart failure: Secondary | ICD-10-CM | POA: Insufficient documentation

## 2015-12-29 DIAGNOSIS — I4891 Unspecified atrial fibrillation: Secondary | ICD-10-CM | POA: Insufficient documentation

## 2015-12-29 DIAGNOSIS — E119 Type 2 diabetes mellitus without complications: Secondary | ICD-10-CM | POA: Diagnosis not present

## 2015-12-29 DIAGNOSIS — Z950 Presence of cardiac pacemaker: Secondary | ICD-10-CM | POA: Insufficient documentation

## 2015-12-29 DIAGNOSIS — D649 Anemia, unspecified: Secondary | ICD-10-CM | POA: Diagnosis not present

## 2015-12-29 HISTORY — PX: PORTACATH PLACEMENT: SHX2246

## 2015-12-29 LAB — PROTIME-INR
INR: 1.14 (ref 0.00–1.49)
PROTHROMBIN TIME: 14.8 s (ref 11.6–15.2)

## 2015-12-29 LAB — GLUCOSE, CAPILLARY: Glucose-Capillary: 178 mg/dL — ABNORMAL HIGH (ref 65–99)

## 2015-12-29 SURGERY — INSERTION, TUNNELED CENTRAL VENOUS DEVICE, WITH PORT
Anesthesia: General

## 2015-12-29 MED ORDER — 0.9 % SODIUM CHLORIDE (POUR BTL) OPTIME
TOPICAL | Status: DC | PRN
  Administered 2015-12-29: 1000 mL

## 2015-12-29 MED ORDER — LIDOCAINE 2% (20 MG/ML) 5 ML SYRINGE
INTRAMUSCULAR | Status: DC | PRN
  Administered 2015-12-29: 60 mg via INTRAVENOUS

## 2015-12-29 MED ORDER — LIDOCAINE HCL (CARDIAC) 20 MG/ML IV SOLN
INTRAVENOUS | Status: AC
  Filled 2015-12-29: qty 5

## 2015-12-29 MED ORDER — FENTANYL CITRATE (PF) 100 MCG/2ML IJ SOLN
INTRAMUSCULAR | Status: DC | PRN
  Administered 2015-12-29: 100 ug via INTRAVENOUS

## 2015-12-29 MED ORDER — CEFAZOLIN SODIUM-DEXTROSE 2-4 GM/100ML-% IV SOLN
INTRAVENOUS | Status: AC
  Filled 2015-12-29: qty 100

## 2015-12-29 MED ORDER — ONDANSETRON HCL 4 MG/2ML IJ SOLN
INTRAMUSCULAR | Status: DC | PRN
  Administered 2015-12-29: 4 mg via INTRAVENOUS

## 2015-12-29 MED ORDER — HEPARIN SOD (PORK) LOCK FLUSH 100 UNIT/ML IV SOLN
INTRAVENOUS | Status: AC
  Filled 2015-12-29: qty 5

## 2015-12-29 MED ORDER — PROPOFOL 10 MG/ML IV BOLUS
INTRAVENOUS | Status: AC
  Filled 2015-12-29: qty 20

## 2015-12-29 MED ORDER — FENTANYL CITRATE (PF) 100 MCG/2ML IJ SOLN: 25.0000 ug | INTRAMUSCULAR | Status: DC | PRN

## 2015-12-29 MED ORDER — SODIUM CHLORIDE 0.9 % IV SOLN
Freq: Once | INTRAVENOUS | Status: AC
  Administered 2015-12-29: 12:00:00
  Filled 2015-12-29: qty 1.2

## 2015-12-29 MED ORDER — BUPIVACAINE-EPINEPHRINE 0.25% -1:200000 IJ SOLN
INTRAMUSCULAR | Status: DC | PRN
  Administered 2015-12-29: 12 mL

## 2015-12-29 MED ORDER — ROCURONIUM BROMIDE 100 MG/10ML IV SOLN
INTRAVENOUS | Status: AC
  Filled 2015-12-29: qty 1

## 2015-12-29 MED ORDER — HEPARIN SOD (PORK) LOCK FLUSH 100 UNIT/ML IV SOLN
INTRAVENOUS | Status: DC | PRN
  Administered 2015-12-29: 500 [IU]

## 2015-12-29 MED ORDER — LACTATED RINGERS IV SOLN
INTRAVENOUS | Status: DC
  Administered 2015-12-29 (×2): via INTRAVENOUS

## 2015-12-29 MED ORDER — CHLORHEXIDINE GLUCONATE 4 % EX LIQD: 60.0000 mL | Freq: Once | CUTANEOUS | Status: DC

## 2015-12-29 MED ORDER — DEXAMETHASONE SODIUM PHOSPHATE 10 MG/ML IJ SOLN
INTRAMUSCULAR | Status: DC | PRN
  Administered 2015-12-29: 4 mg via INTRAVENOUS

## 2015-12-29 MED ORDER — CEFAZOLIN SODIUM-DEXTROSE 2-4 GM/100ML-% IV SOLN
2.0000 g | INTRAVENOUS | Status: AC
  Administered 2015-12-29: 2 g via INTRAVENOUS

## 2015-12-29 MED ORDER — SODIUM CHLORIDE 0.9 % IV SOLN
10.0000 mg | INTRAVENOUS | Status: DC | PRN
  Administered 2015-12-29: 25 ug/min via INTRAVENOUS

## 2015-12-29 MED ORDER — BUPIVACAINE-EPINEPHRINE 0.25% -1:200000 IJ SOLN
INTRAMUSCULAR | Status: AC
  Filled 2015-12-29: qty 1

## 2015-12-29 MED ORDER — ONDANSETRON HCL 4 MG/2ML IJ SOLN
INTRAMUSCULAR | Status: AC
  Filled 2015-12-29: qty 2

## 2015-12-29 MED ORDER — PHENYLEPHRINE HCL 10 MG/ML IJ SOLN
INTRAMUSCULAR | Status: AC
  Filled 2015-12-29: qty 1

## 2015-12-29 MED ORDER — OXYCODONE HCL 5 MG PO TABS: 5.0000 mg | ORAL_TABLET | ORAL | Status: DC | PRN

## 2015-12-29 MED ORDER — PHENYLEPHRINE HCL 10 MG/ML IJ SOLN
INTRAMUSCULAR | Status: DC | PRN
  Administered 2015-12-29 (×5): 80 ug via INTRAVENOUS

## 2015-12-29 MED ORDER — PROPOFOL 10 MG/ML IV BOLUS
INTRAVENOUS | Status: DC | PRN
  Administered 2015-12-29: 140 mg via INTRAVENOUS

## 2015-12-29 MED ORDER — ONDANSETRON HCL 4 MG/2ML IJ SOLN: 4.0000 mg | Freq: Once | INTRAMUSCULAR | Status: DC | PRN

## 2015-12-29 MED ORDER — FENTANYL CITRATE (PF) 250 MCG/5ML IJ SOLN
INTRAMUSCULAR | Status: AC
  Filled 2015-12-29: qty 5

## 2015-12-29 MED ORDER — MEPERIDINE HCL 50 MG/ML IJ SOLN: 6.2500 mg | INTRAMUSCULAR | Status: DC | PRN

## 2015-12-29 MED ORDER — DEXAMETHASONE SODIUM PHOSPHATE 10 MG/ML IJ SOLN
INTRAMUSCULAR | Status: AC
  Filled 2015-12-29: qty 1

## 2015-12-29 SURGICAL SUPPLY — 41 items
ADH SKN CLS APL DERMABOND .7 (GAUZE/BANDAGES/DRESSINGS) ×1
APL SKNCLS STERI-STRIP NONHPOA (GAUZE/BANDAGES/DRESSINGS)
BAG DECANTER FOR FLEXI CONT (MISCELLANEOUS) ×3 IMPLANT
BENZOIN TINCTURE PRP APPL 2/3 (GAUZE/BANDAGES/DRESSINGS) IMPLANT
BLADE SURG 15 STRL LF DISP TIS (BLADE) ×1 IMPLANT
BLADE SURG 15 STRL SS (BLADE) ×3
CLOSURE WOUND 1/2 X4 (GAUZE/BANDAGES/DRESSINGS)
COVER SURGICAL LIGHT HANDLE (MISCELLANEOUS) ×3 IMPLANT
DECANTER SPIKE VIAL GLASS SM (MISCELLANEOUS) ×3 IMPLANT
DERMABOND ADVANCED (GAUZE/BANDAGES/DRESSINGS) ×2
DERMABOND ADVANCED .7 DNX12 (GAUZE/BANDAGES/DRESSINGS) ×1 IMPLANT
DRAPE C-ARM 42X120 X-RAY (DRAPES) ×3 IMPLANT
DRAPE LAPAROSCOPIC ABDOMINAL (DRAPES) ×3 IMPLANT
DRAPE UTILITY XL STRL (DRAPES) ×3 IMPLANT
DRSG TEGADERM 6X8 (GAUZE/BANDAGES/DRESSINGS) IMPLANT
ELECT PENCIL ROCKER SW 15FT (MISCELLANEOUS) ×3 IMPLANT
ELECT REM PT RETURN 9FT ADLT (ELECTROSURGICAL) ×3
ELECTRODE REM PT RTRN 9FT ADLT (ELECTROSURGICAL) ×1 IMPLANT
GAUZE SPONGE 4X4 12PLY STRL (GAUZE/BANDAGES/DRESSINGS) IMPLANT
GAUZE SPONGE 4X4 16PLY XRAY LF (GAUZE/BANDAGES/DRESSINGS) ×3 IMPLANT
GLOVE BIO SURGEON STRL SZ7.5 (GLOVE) ×3 IMPLANT
GLOVE INDICATOR 8.0 STRL GRN (GLOVE) ×3 IMPLANT
GOWN STRL REUS W/TWL LRG LVL3 (GOWN DISPOSABLE) ×3 IMPLANT
GOWN STRL REUS W/TWL XL LVL3 (GOWN DISPOSABLE) ×6 IMPLANT
KIT BASIN OR (CUSTOM PROCEDURE TRAY) ×3 IMPLANT
KIT PORT POWER 8FR ISP CVUE (Catheter) ×2 IMPLANT
MARKER SKIN DUAL TIP RULER LAB (MISCELLANEOUS) IMPLANT
NEEDLE HYPO 22GX1.5 SAFETY (NEEDLE) ×3 IMPLANT
NEEDLE HYPO 25X1 1.5 SAFETY (NEEDLE) IMPLANT
NS IRRIG 1000ML POUR BTL (IV SOLUTION) ×3 IMPLANT
PACK BASIC VI WITH GOWN DISP (CUSTOM PROCEDURE TRAY) ×3 IMPLANT
STRIP CLOSURE SKIN 1/2X4 (GAUZE/BANDAGES/DRESSINGS) IMPLANT
SUT MNCRL AB 4-0 PS2 18 (SUTURE) ×3 IMPLANT
SUT PROLENE 2 0 SH DA (SUTURE) ×3 IMPLANT
SUT VIC AB 3-0 SH 27 (SUTURE) ×3
SUT VIC AB 3-0 SH 27XBRD (SUTURE) ×1 IMPLANT
SYR BULB IRRIGATION 50ML (SYRINGE) IMPLANT
SYR CONTROL 10ML LL (SYRINGE) ×3 IMPLANT
SYRINGE 10CC LL (SYRINGE) ×3 IMPLANT
TOWEL OR 17X26 10 PK STRL BLUE (TOWEL DISPOSABLE) ×3 IMPLANT
TOWEL OR NON WOVEN STRL DISP B (DISPOSABLE) ×3 IMPLANT

## 2015-12-29 NOTE — Interval H&P Note (Signed)
History and Physical Interval Note:  12/29/2015 10:41 AM  Ernest Lara  has presented today for surgery, with the diagnosis of colon cancer  The various methods of treatment have been discussed with the patient and family. After consideration of risks, benefits and other options for treatment, the patient has consented to  Procedure(s): INSERTION PORT-A-CATH WITH ULTRASOUND GUIDANCE (N/A) as a surgical intervention .  The patient's history has been reviewed, patient examined, no change in status, stable for surgery.  I have reviewed the patient's chart and labs.  Questions were answered to the patient's satisfaction.    Leighton Ruff. Redmond Pulling, MD, [place], Bariatric, & Minimally Invasive Surgery [facility], [place]    [facility]

## 2015-12-29 NOTE — Anesthesia Procedure Notes (Signed)
Procedure Name: LMA Insertion Date/Time: 12/29/2015 11:03 AM Performed by: Talbot Grumbling Pre-anesthesia Checklist: Patient identified, Emergency Drugs available, Suction available and Patient being monitored Patient Re-evaluated:Patient Re-evaluated prior to inductionOxygen Delivery Method: Circle system utilized Preoxygenation: Pre-oxygenation with 100% oxygen Intubation Type: IV induction Ventilation: Mask ventilation without difficulty LMA: LMA inserted LMA Size: 5.0 Number of attempts: 1 Placement Confirmation: positive ETCO2 and breath sounds checked- equal and bilateral Tube secured with: Tape Dental Injury: Teeth and Oropharynx as per pre-operative assessment

## 2015-12-29 NOTE — Op Note (Signed)
[name] Lara [id number] May 29, 1941 12/29/2015  Preoperative diagnosis: Cancer of the colon and the poor venous access  Postoperative diagnosis: Same  Procedure: Placement of ClearVue 8Fr subcutaneous (right IJ) venous port under ultrasound guidance  Surgeon: Gayland Curry, MD FACS  Anesthesia: General LMA with Local  Description of procedure: Patient is brought to the operating room and placed in the supine position on the operating table. General LMA sedation was administered. The entire upper chest and neck were widely sterilely prepped and draped. Local anesthesia was used to infiltrate the insertion of port site. One attempt was made to cannulate the right subclavian vein but the vein was not able to be accessed. Therefore the right IJ vein was cannulated with a needle under ultrasound guidance and guidewire without difficulty and position in the superior vena cava was confirmed by fluoroscopy. A small transverse incision was made in the right anterior chest wall and subcutaneous pocket created with local. A tendon sheath grasper was then tunneled from the pocket to the right neck at the guidewire entrance site. The catheter was then attached to the tendon sheath grasper and then brought out into the pocket. The introducer was then placed over the guidewire under fluoro and the flushed catheter placed via the introducer which was stripped away and the tip of the catheter positioned near the cavoatrial junction. It was trimmed to length, and attached to the flushed port which was positioned in the pocket. The port was sutured to the chest wall with interrupted 2-0 Prolene. The incision was closed with 2-0 vicryl in deep dermis and then with subcuticular Monocryl and Dermabond. The small incision in the neck was closed with monocryl and dermabond. The port was accessed and flushed and aspirated easily and was left flushed with concentrated heparin solution. Sponge needle as the counts were correct.  The patient was taken to recovery in good condition. CXR pending.   Leighton Ruff. Redmond Pulling, MD, FACS General, Bariatric, & Minimally Invasive Surgery [facility], [place]

## 2015-12-29 NOTE — Anesthesia Postprocedure Evaluation (Signed)
Anesthesia Post Note  Patient: Ernest Lara  Procedure(s) Performed: Procedure(s) (LRB): INSERTION PORT-A-CATH WITH ULTRASOUND GUIDANCE (N/A)  Patient location during evaluation: PACU Anesthesia Type: General Level of consciousness: awake and alert and oriented Pain management: pain level controlled Vital Signs Assessment: post-procedure vital signs reviewed and stable Respiratory status: spontaneous breathing, nonlabored ventilation and respiratory function stable Cardiovascular status: blood pressure returned to baseline and stable Postop Assessment: no signs of nausea or vomiting Anesthetic complications: no    Last Vitals:  Filed Vitals:   12/29/15 1230 12/29/15 1245  BP: 159/84 156/83  Pulse: 64 64  Temp: 36.4 C   Resp: 10 19    Last Pain: There were no vitals filed for this visit.               [name] A.

## 2015-12-29 NOTE — Transfer of Care (Signed)
Immediate Anesthesia Transfer of Care Note  Patient: Ernest Lara  Procedure(s) Performed: Procedure(s): INSERTION PORT-A-CATH WITH ULTRASOUND GUIDANCE (N/A)  Patient Location: PACU  Anesthesia Type:General  Level of Consciousness:  sedated, patient cooperative and responds to stimulation  Airway & Oxygen Therapy:Patient Spontanous Breathing and Patient connected to face mask oxgen  Post-op Assessment:  Report given to PACU RN and Post -op Vital signs reviewed and stable  Post vital signs:  Reviewed and stable  Last Vitals:  Filed Vitals:   12/29/15 0918  BP: 139/75  Pulse: 67  Temp: 36.8 C  Resp: 18    Complications: No apparent anesthesia complications

## 2015-12-29 NOTE — Discharge Instructions (Signed)
CAN RESUME [place] ON Tuesday     PORT-A-CATH: POST OP INSTRUCTIONS  Always review your discharge instruction sheet given to you by the facility where your surgery was performed.   1. A prescription for pain medication may be given to you upon discharge. Take your pain medication as prescribed, if needed. If narcotic pain medicine is not needed, then you make take acetaminophen (Tylenol) or ibuprofen (Advil) as needed.  2. Take your usually prescribed medications unless otherwise directed. 3. If you need a refill on your pain medication, please contact our office. All narcotic pain medicine now requires a paper prescription.  Phoned in and fax refills are no longer allowed by law.  Prescriptions will not be filled after 5 pm or on weekends.  4. You should follow a light diet for the remainder of the day after your procedure. 5. Most patients will experience some mild swelling and/or bruising in the area of the incision. It may take several days to resolve. 6. It is common to experience some constipation if taking pain medication after surgery. Increasing fluid intake and taking a stool softener (such as Colace) will usually help or prevent this problem from occurring. A mild laxative (Milk of Magnesia or Miralax) should be taken according to package directions if there are no bowel movements after 48 hours.  7. Unless discharge instructions indicate otherwise, you may remove your bandages 48 hours after surgery, and you may shower at that time. You may have steri-strips (small white skin tapes) in place directly over the incision.  These strips should be left on the skin for 7-10 days.  If your surgeon used Dermabond (skin glue) on the incision, you may shower in 24 hours.  The glue will flake off over the next 2-3 weeks.  8. If your port is left accessed at the end of surgery (needle left in port), the dressing cannot get wet and should only by changed by a healthcare professional. When the port is  no longer accessed (when the needle has been removed), follow step 7.   9. ACTIVITIES:  Limit activity involving your arms for the next 72 hours. Do no strenuous exercise or activity for 1 week. You may drive when you are no longer taking prescription pain medication, you can comfortably wear a seatbelt, and you can maneuver your car. 10.You may need to see your doctor in the office for a follow-up appointment.  Please       check with your doctor.  11.When you receive a new Port-a-Cath, you will get a product guide and        ID card.  Please keep them in case you need them.  WHEN TO CALL YOUR DOCTOR [phone]): 1. Fever over 101.0 2. Chills 3. Continued bleeding from incision 4. Increased redness and tenderness at the site 5. Shortness of breath, difficulty breathing   The clinic staff is available to answer your questions during regular business hours. Please dont hesitate to call and ask to speak to one of the nurses or medical assistants for clinical concerns. If you have a medical emergency, go to the nearest emergency room or call 911.  A surgeon from [facility] is always on call at the hospital.     For further information, please visit [email or web address]

## 2015-12-29 NOTE — Anesthesia Preprocedure Evaluation (Addendum)
Anesthesia Evaluation  Patient identified by MRN, date of birth, ID band Patient awake    Reviewed: Allergy & Precautions, NPO status , Patient's Chart, lab work & pertinent test results, reviewed documented beta blocker date and time   Airway Mallampati: II  TM Distance: >3 FB Neck ROM: Full    Dental  (+) Missing, Dental Advisory Given   Pulmonary shortness of breath and with exertion, sleep apnea and Continuous Positive Airway Pressure Ventilation ,    Pulmonary exam normal breath sounds clear to auscultation       Cardiovascular hypertension, Pt. on home beta blockers and Pt. on medications +CHF  Normal cardiovascular exam+ dysrhythmias Atrial Fibrillation + pacemaker  Rhythm:Regular Rate:Normal  EKG- NSR, ant-lat infarct, inf infarct  Echo- LVEF 60-65%  Hx/o 3rd degree heart block  PPM- interogation on chart from 12/15/2015   Neuro/Psych negative neurological ROS     GI/Hepatic Neg liver ROS, AdenoCa of colon   Endo/Other  diabetes, Poorly Controlled, Type 2, Oral Hypoglycemic AgentsHyperlipidemia Obesity  Renal/GU Renal InsufficiencyRenal disease  negative genitourinary   Musculoskeletal negative musculoskeletal ROS (+)   Abdominal (+) + obese,   Peds  Hematology  (+) anemia , On xarelto- last dose   Anesthesia Other Findings   Reproductive/Obstetrics                        Anesthesia Physical Anesthesia Plan  ASA: III  Anesthesia Plan: General   Post-op Pain Management:    Induction: Intravenous  Airway Management Planned: LMA  Additional Equipment:   Intra-op Plan:   Post-operative Plan: Extubation in OR  Informed Consent: I have reviewed the patients History and Physical, chart, labs and discussed the procedure including the risks, benefits and alternatives for the proposed anesthesia with the patient or authorized representative who has indicated his/her understanding  and acceptance.   Dental advisory given  Plan Discussed with: CRNA, Anesthesiologist and Surgeon  Anesthesia Plan Comments:        Anesthesia Quick Evaluation

## 2016-01-03 ENCOUNTER — Other Ambulatory Visit: Payer: Self-pay | Admitting: Cardiovascular Disease

## 2016-01-04 ENCOUNTER — Encounter: Payer: Self-pay | Admitting: *Deleted

## 2016-01-04 ENCOUNTER — Other Ambulatory Visit: Payer: Medicare Other

## 2016-01-04 ENCOUNTER — Other Ambulatory Visit: Payer: Self-pay | Admitting: *Deleted

## 2016-01-05 ENCOUNTER — Other Ambulatory Visit: Payer: Medicare Other

## 2016-01-05 ENCOUNTER — Ambulatory Visit: Payer: Medicare Other | Admitting: Hematology

## 2016-01-05 ENCOUNTER — Telehealth: Payer: Self-pay | Admitting: Hematology

## 2016-01-05 NOTE — Telephone Encounter (Signed)
spoke w/ pt confirmed aug apt times

## 2016-01-09 ENCOUNTER — Encounter: Payer: Self-pay | Admitting: Hematology

## 2016-01-09 ENCOUNTER — Ambulatory Visit (HOSPITAL_BASED_OUTPATIENT_CLINIC_OR_DEPARTMENT_OTHER): Payer: Medicare Other

## 2016-01-09 ENCOUNTER — Ambulatory Visit (HOSPITAL_BASED_OUTPATIENT_CLINIC_OR_DEPARTMENT_OTHER): Payer: Medicare Other | Admitting: Hematology

## 2016-01-09 ENCOUNTER — Telehealth: Payer: Self-pay | Admitting: Hematology

## 2016-01-09 VITALS — BP 138/63 | HR 73 | Temp 97.9°F | Resp 18 | Ht 75.0 in | Wt 245.3 lb

## 2016-01-09 DIAGNOSIS — C184 Malignant neoplasm of transverse colon: Secondary | ICD-10-CM

## 2016-01-09 DIAGNOSIS — Z9189 Other specified personal risk factors, not elsewhere classified: Secondary | ICD-10-CM

## 2016-01-09 DIAGNOSIS — E119 Type 2 diabetes mellitus without complications: Secondary | ICD-10-CM | POA: Diagnosis not present

## 2016-01-09 DIAGNOSIS — D509 Iron deficiency anemia, unspecified: Secondary | ICD-10-CM

## 2016-01-09 DIAGNOSIS — I10 Essential (primary) hypertension: Secondary | ICD-10-CM

## 2016-01-09 DIAGNOSIS — N183 Chronic kidney disease, stage 3 (moderate): Secondary | ICD-10-CM

## 2016-01-09 LAB — CBC & DIFF AND RETIC
BASO%: 0.3 % (ref 0.0–2.0)
Basophils Absolute: 0 10*3/uL (ref 0.0–0.1)
EOS%: 1.7 % (ref 0.0–7.0)
Eosinophils Absolute: 0.1 10*3/uL (ref 0.0–0.5)
HCT: 34.2 % — ABNORMAL LOW (ref 38.4–49.9)
HGB: 11.5 g/dL — ABNORMAL LOW (ref 13.0–17.1)
IMMATURE RETIC FRACT: 7.4 % (ref 3.00–10.60)
LYMPH%: 27.7 % (ref 14.0–49.0)
MCH: 25.2 pg — AB (ref 27.2–33.4)
MCHC: 33.6 g/dL (ref 32.0–36.0)
MCV: 74.8 fL — AB (ref 79.3–98.0)
MONO#: 0.5 10*3/uL (ref 0.1–0.9)
MONO%: 8.9 % (ref 0.0–14.0)
NEUT%: 61.4 % (ref 39.0–75.0)
NEUTROS ABS: 3.7 10*3/uL (ref 1.5–6.5)
Platelets: 184 10*3/uL (ref 140–400)
RBC: 4.57 10*6/uL (ref 4.20–5.82)
RDW: 20.9 % — ABNORMAL HIGH (ref 11.0–14.6)
Retic %: 0.75 % — ABNORMAL LOW (ref 0.80–1.80)
Retic Ct Abs: 34.28 10*3/uL — ABNORMAL LOW (ref 34.80–93.90)
WBC: 6 10*3/uL (ref 4.0–10.3)
lymph#: 1.7 10*3/uL (ref 0.9–3.3)

## 2016-01-09 LAB — COMPREHENSIVE METABOLIC PANEL
ALT: 21 U/L (ref 0–55)
AST: 24 U/L (ref 5–34)
Albumin: 3.6 g/dL (ref 3.5–5.0)
Alkaline Phosphatase: 80 U/L (ref 40–150)
Anion Gap: 9 mEq/L (ref 3–11)
BUN: 28.5 mg/dL — ABNORMAL HIGH (ref 7.0–26.0)
CHLORIDE: 105 meq/L (ref 98–109)
CO2: 27 meq/L (ref 22–29)
Calcium: 10.6 mg/dL — ABNORMAL HIGH (ref 8.4–10.4)
Creatinine: 1.7 mg/dL — ABNORMAL HIGH (ref 0.7–1.3)
EGFR: 45 mL/min/{1.73_m2} — AB (ref >90)
GLUCOSE: 104 mg/dL (ref 70–140)
POTASSIUM: 4.1 meq/L (ref 3.5–5.1)
SODIUM: 141 meq/L (ref 136–145)
TOTAL PROTEIN: 7.7 g/dL (ref 6.4–8.3)
Total Bilirubin: 0.67 mg/dL (ref 0.20–1.20)

## 2016-01-09 NOTE — Telephone Encounter (Signed)
per pof to sch pt appt-per Delle Reining she stated it was [place] and pt aware of appt for 8/2-stated she would let Dr Irene Limbo know no trmt plan entered

## 2016-01-10 ENCOUNTER — Other Ambulatory Visit: Payer: Medicare Other

## 2016-01-10 ENCOUNTER — Other Ambulatory Visit: Payer: Self-pay | Admitting: Hematology

## 2016-01-10 ENCOUNTER — Encounter: Payer: Self-pay | Admitting: *Deleted

## 2016-01-10 ENCOUNTER — Ambulatory Visit (HOSPITAL_BASED_OUTPATIENT_CLINIC_OR_DEPARTMENT_OTHER): Payer: Medicare Other

## 2016-01-10 VITALS — BP 114/64 | HR 87 | Temp 97.8°F | Resp 18

## 2016-01-10 DIAGNOSIS — Z5111 Encounter for antineoplastic chemotherapy: Secondary | ICD-10-CM | POA: Diagnosis not present

## 2016-01-10 DIAGNOSIS — C184 Malignant neoplasm of transverse colon: Secondary | ICD-10-CM | POA: Diagnosis not present

## 2016-01-10 LAB — FERRITIN: FERRITIN: 54 ng/mL (ref 22–316)

## 2016-01-10 LAB — CEA: CEA1: 1.8 ng/mL (ref 0.0–4.7)

## 2016-01-10 MED ORDER — SODIUM CHLORIDE 0.9 % IV SOLN
10.0000 mg | Freq: Once | INTRAVENOUS | Status: AC
  Administered 2016-01-10: 10 mg via INTRAVENOUS
  Filled 2016-01-10: qty 1

## 2016-01-10 MED ORDER — OXALIPLATIN CHEMO INJECTION 100 MG/20ML
83.0000 mg/m2 | Freq: Once | INTRAVENOUS | Status: AC
  Administered 2016-01-10: 200 mg via INTRAVENOUS
  Filled 2016-01-10: qty 40

## 2016-01-10 MED ORDER — DEXAMETHASONE 4 MG PO TABS: 8.0000 mg | ORAL_TABLET | Freq: Every day | ORAL | 1 refills | Status: DC

## 2016-01-10 MED ORDER — DEXTROSE 5 % IV SOLN
Freq: Once | INTRAVENOUS | Status: AC
  Administered 2016-01-10: 14:00:00 via INTRAVENOUS

## 2016-01-10 MED ORDER — LIDOCAINE-PRILOCAINE 2.5-2.5 % EX CREA: TOPICAL_CREAM | CUTANEOUS | 3 refills | Status: DC

## 2016-01-10 MED ORDER — SODIUM CHLORIDE 0.9% FLUSH
10.0000 mL | INTRAVENOUS | Status: DC | PRN
  Filled 2016-01-10: qty 10

## 2016-01-10 MED ORDER — PALONOSETRON HCL INJECTION 0.25 MG/5ML
INTRAVENOUS | Status: AC
  Filled 2016-01-10: qty 5

## 2016-01-10 MED ORDER — DEXTROSE 5 % IV SOLN
400.0000 mg/m2 | Freq: Once | INTRAVENOUS | Status: AC
  Administered 2016-01-10: 972 mg via INTRAVENOUS
  Filled 2016-01-10: qty 48.6

## 2016-01-10 MED ORDER — HEPARIN SOD (PORK) LOCK FLUSH 100 UNIT/ML IV SOLN
500.0000 [IU] | Freq: Once | INTRAVENOUS | Status: DC | PRN
  Filled 2016-01-10: qty 5

## 2016-01-10 MED ORDER — SODIUM CHLORIDE 0.9 % IV SOLN
2400.0000 mg/m2 | INTRAVENOUS | Status: DC
  Administered 2016-01-10: 5850 mg via INTRAVENOUS
  Filled 2016-01-10: qty 117

## 2016-01-10 MED ORDER — PALONOSETRON HCL INJECTION 0.25 MG/5ML
0.2500 mg | Freq: Once | INTRAVENOUS | Status: AC
  Administered 2016-01-10: 0.25 mg via INTRAVENOUS

## 2016-01-10 MED ORDER — FLUOROURACIL CHEMO INJECTION 2.5 GM/50ML
400.0000 mg/m2 | Freq: Once | INTRAVENOUS | Status: AC
  Administered 2016-01-10: 950 mg via INTRAVENOUS
  Filled 2016-01-10: qty 19

## 2016-01-10 MED ORDER — PROCHLORPERAZINE MALEATE 10 MG PO TABS: 10.0000 mg | ORAL_TABLET | Freq: Four times a day (QID) | ORAL | 1 refills | Status: DC | PRN

## 2016-01-10 MED ORDER — ONDANSETRON HCL 8 MG PO TABS: 8.0000 mg | ORAL_TABLET | Freq: Two times a day (BID) | ORAL | 1 refills | Status: DC | PRN

## 2016-01-10 NOTE — Patient Instructions (Signed)
[facility] Discharge Instructions for Patients Receiving Chemotherapy  Today you received the following chemotherapy agents Oxaliplatin, Leucovorin and Adrucil (5FU)  To help prevent nausea and vomiting after your treatment, we encourage you to take your nausea medications: Zofran (Ondansetron 8 mg) twice daily as needed for nausea. You can start taking Zofran on day 3 which is Friday. Compazine (Prochlorperazine) 10 mg every 6 hours as needed for nausea. Compazine can make you sleepy.   If you develop nausea and vomiting that is not controlled by your nausea medication, call the clinic.   BELOW ARE SYMPTOMS THAT SHOULD BE REPORTED IMMEDIATELY:  *FEVER GREATER THAN 100.5 F  *CHILLS WITH OR WITHOUT FEVER  NAUSEA AND VOMITING THAT IS NOT CONTROLLED WITH YOUR NAUSEA MEDICATION  *UNUSUAL SHORTNESS OF BREATH  *UNUSUAL BRUISING OR BLEEDING  TENDERNESS IN MOUTH AND THROAT WITH OR WITHOUT PRESENCE OF ULCERS  *URINARY PROBLEMS  *BOWEL PROBLEMS  UNUSUAL RASH Items with * indicate a potential emergency and should be followed up as soon as possible.  Feel free to call the clinic you have any questions or concerns. The clinic phone number is [phone].  Please show the [place] at check-in to the Emergency Department and triage nurse.  You may experience COLD Sensitivity after your treatment.  Oxaliplatin Injection What is this medicine? OXALIPLATIN (ox AL i PLA tin) is a chemotherapy drug. It targets fast dividing cells, like cancer cells, and causes these cells to die. This medicine is used to treat cancers of the colon and rectum, and many other cancers. This medicine may be used for other purposes; ask your health care provider or pharmacist if you have questions. What should I tell my health care provider before I take this medicine? They need to know if you have any of these conditions: -kidney disease -an unusual or allergic reaction to oxaliplatin,  other chemotherapy, other medicines, foods, dyes, or preservatives -pregnant or trying to get pregnant -breast-feeding How should I use this medicine? This drug is given as an infusion into a vein. It is administered in a hospital or clinic by a specially trained health care professional. Talk to your pediatrician regarding the use of this medicine in children. Special care may be needed. Overdosage: If you think you have taken too much of this medicine contact a poison control center or emergency room at once. NOTE: This medicine is only for you. Do not share this medicine with others. What if I miss a dose? It is important not to miss a dose. Call your doctor or health care professional if you are unable to keep an appointment. What may interact with this medicine? -medicines to increase blood counts like filgrastim, pegfilgrastim, sargramostim -probenecid -some antibiotics like amikacin, gentamicin, neomycin, polymyxin B, streptomycin, tobramycin -zalcitabine Talk to your doctor or health care professional before taking any of these medicines: -acetaminophen -aspirin -ibuprofen -ketoprofen -naproxen This list may not describe all possible interactions. Give your health care provider a list of all the medicines, herbs, non-prescription drugs, or dietary supplements you use. Also tell them if you smoke, drink alcohol, or use illegal drugs. Some items may interact with your medicine. What should I watch for while using this medicine? Your condition will be monitored carefully while you are receiving this medicine. You will need important blood work done while you are taking this medicine. This medicine can make you more sensitive to cold. Do not drink cold drinks or use ice. Cover exposed skin before coming in  contact with cold temperatures or cold objects. When out in cold weather wear warm clothing and cover your mouth and nose to warm the air that goes into your lungs. Tell your doctor if  you get sensitive to the cold. This drug may make you feel generally unwell. This is not uncommon, as chemotherapy can affect healthy cells as well as cancer cells. Report any side effects. Continue your course of treatment even though you feel ill unless your doctor tells you to stop. In some cases, you may be given additional medicines to help with side effects. Follow all directions for their use. Call your doctor or health care professional for advice if you get a fever, chills or sore throat, or other symptoms of a cold or flu. Do not treat yourself. This drug decreases your body's ability to fight infections. Try to avoid being around people who are sick. This medicine may increase your risk to bruise or bleed. Call your doctor or health care professional if you notice any unusual bleeding. Be careful brushing and flossing your teeth or using a toothpick because you may get an infection or bleed more easily. If you have any dental work done, tell your dentist you are receiving this medicine. Avoid taking products that contain aspirin, acetaminophen, ibuprofen, naproxen, or ketoprofen unless instructed by your doctor. These medicines may hide a fever. Do not become pregnant while taking this medicine. Women should inform their doctor if they wish to become pregnant or think they might be pregnant. There is a potential for serious side effects to an unborn child. Talk to your health care professional or pharmacist for more information. Do not breast-feed an infant while taking this medicine. Call your doctor or health care professional if you get diarrhea. Do not treat yourself. What side effects may I notice from receiving this medicine? Side effects that you should report to your doctor or health care professional as soon as possible: -allergic reactions like skin rash, itching or hives, swelling of the face, lips, or tongue -low blood counts - This drug may decrease the number of white blood  cells, red blood cells and platelets. You may be at increased risk for infections and bleeding. -signs of infection - fever or chills, cough, sore throat, pain or difficulty passing urine -signs of decreased platelets or bleeding - bruising, pinpoint red spots on the skin, black, tarry stools, nosebleeds -signs of decreased red blood cells - unusually weak or tired, fainting spells, lightheadedness -breathing problems -chest pain, pressure -cough -diarrhea -jaw tightness -mouth sores -nausea and vomiting -pain, swelling, redness or irritation at the injection site -pain, tingling, numbness in the hands or feet -problems with balance, talking, walking -redness, blistering, peeling or loosening of the skin, including inside the mouth -trouble passing urine or change in the amount of urine Side effects that usually do not require medical attention (report to your doctor or health care professional if they continue or are bothersome): -changes in vision -constipation -hair loss -loss of appetite -metallic taste in the mouth or changes in taste -stomach pain This list may not describe all possible side effects. Call your doctor for medical advice about side effects. You may report side effects to FDA at 1-800-FDA-1088. Where should I keep my medicine? This drug is given in a hospital or clinic and will not be stored at home. NOTE: This sheet is a summary. It may not cover all possible information. If you have questions about this medicine, talk to your doctor, pharmacist,  or health care provider.    2016, Elsevier/Gold Standard. (2007-12-22 17:22:47)   Leucovorin injection What is this medicine? LEUCOVORIN (loo koe VOR in) is used to prevent or treat the harmful effects of some medicines. This medicine is used to treat anemia caused by a low amount of folic acid in the body. It is also used with 5-fluorouracil (5-FU) to treat colon cancer. This medicine may be used for other purposes;  ask your health care provider or pharmacist if you have questions. What should I tell my health care provider before I take this medicine? They need to know if you have any of these conditions: -anemia from low levels of vitamin B-12 in the blood -an unusual or allergic reaction to leucovorin, folic acid, other medicines, foods, dyes, or preservatives -pregnant or trying to get pregnant -breast-feeding How should I use this medicine? This medicine is for injection into a muscle or into a vein. It is given by a health care professional in a hospital or clinic setting. Talk to your pediatrician regarding the use of this medicine in children. Special care may be needed. Overdosage: If you think you have taken too much of this medicine contact a poison control center or emergency room at once. NOTE: This medicine is only for you. Do not share this medicine with others. What if I miss a dose? This does not apply. What may interact with this medicine? -capecitabine -fluorouracil -phenobarbital -phenytoin -primidone -trimethoprim-sulfamethoxazole This list may not describe all possible interactions. Give your health care provider a list of all the medicines, herbs, non-prescription drugs, or dietary supplements you use. Also tell them if you smoke, drink alcohol, or use illegal drugs. Some items may interact with your medicine. What should I watch for while using this medicine? Your condition will be monitored carefully while you are receiving this medicine. This medicine may increase the side effects of 5-fluorouracil, 5-FU. Tell your doctor or health care professional if you have diarrhea or mouth sores that do not get better or that get worse. What side effects may I notice from receiving this medicine? Side effects that you should report to your doctor or health care professional as soon as possible: -allergic reactions like skin rash, itching or hives, swelling of the face, lips, or  tongue -breathing problems -fever, infection -mouth sores -unusual bleeding or bruising -unusually weak or tired Side effects that usually do not require medical attention (report to your doctor or health care professional if they continue or are bothersome): -constipation or diarrhea -loss of appetite -nausea, vomiting This list may not describe all possible side effects. Call your doctor for medical advice about side effects. You may report side effects to FDA at 1-800-FDA-1088. Where should I keep my medicine? This drug is given in a hospital or clinic and will not be stored at home. NOTE: This sheet is a summary. It may not cover all possible information. If you have questions about this medicine, talk to your doctor, pharmacist, or health care provider.    2016, Elsevier/Gold Standard. (2007-12-01 16:50:29)   Fluorouracil, 5-FU injection What is this medicine? FLUOROURACIL, 5-FU (flure oh YOOR a sil) is a chemotherapy drug. It slows the growth of cancer cells. This medicine is used to treat many types of cancer like breast cancer, colon or rectal cancer, pancreatic cancer, and stomach cancer. This medicine may be used for other purposes; ask your health care provider or pharmacist if you have questions. What should I tell my health care provider before  I take this medicine? They need to know if you have any of these conditions: -blood disorders -dihydropyrimidine dehydrogenase (DPD) deficiency -infection (especially a virus infection such as chickenpox, cold sores, or herpes) -kidney disease -liver disease -malnourished, poor nutrition -recent or ongoing radiation therapy -an unusual or allergic reaction to fluorouracil, other chemotherapy, other medicines, foods, dyes, or preservatives -pregnant or trying to get pregnant -breast-feeding How should I use this medicine? This drug is given as an infusion or injection into a vein. It is administered in a hospital or clinic by a  specially trained health care professional. Talk to your pediatrician regarding the use of this medicine in children. Special care may be needed. Overdosage: If you think you have taken too much of this medicine contact a poison control center or emergency room at once. NOTE: This medicine is only for you. Do not share this medicine with others. What if I miss a dose? It is important not to miss your dose. Call your doctor or health care professional if you are unable to keep an appointment. What may interact with this medicine? -allopurinol -cimetidine -dapsone -digoxin -hydroxyurea -leucovorin -levamisole -medicines for seizures like ethotoin, fosphenytoin, phenytoin -medicines to increase blood counts like filgrastim, pegfilgrastim, sargramostim -medicines that treat or prevent blood clots like warfarin, enoxaparin, and dalteparin -methotrexate -metronidazole -pyrimethamine -some other chemotherapy drugs like busulfan, cisplatin, estramustine, vinblastine -trimethoprim -trimetrexate -vaccines Talk to your doctor or health care professional before taking any of these medicines: -acetaminophen -aspirin -ibuprofen -ketoprofen -naproxen This list may not describe all possible interactions. Give your health care provider a list of all the medicines, herbs, non-prescription drugs, or dietary supplements you use. Also tell them if you smoke, drink alcohol, or use illegal drugs. Some items may interact with your medicine. What should I watch for while using this medicine? Visit your doctor for checks on your progress. This drug may make you feel generally unwell. This is not uncommon, as chemotherapy can affect healthy cells as well as cancer cells. Report any side effects. Continue your course of treatment even though you feel ill unless your doctor tells you to stop. In some cases, you may be given additional medicines to help with side effects. Follow all directions for their  use. Call your doctor or health care professional for advice if you get a fever, chills or sore throat, or other symptoms of a cold or flu. Do not treat yourself. This drug decreases your body's ability to fight infections. Try to avoid being around people who are sick. This medicine may increase your risk to bruise or bleed. Call your doctor or health care professional if you notice any unusual bleeding. Be careful brushing and flossing your teeth or using a toothpick because you may get an infection or bleed more easily. If you have any dental work done, tell your dentist you are receiving this medicine. Avoid taking products that contain aspirin, acetaminophen, ibuprofen, naproxen, or ketoprofen unless instructed by your doctor. These medicines may hide a fever. Do not become pregnant while taking this medicine. Women should inform their doctor if they wish to become pregnant or think they might be pregnant. There is a potential for serious side effects to an unborn child. Talk to your health care professional or pharmacist for more information. Do not breast-feed an infant while taking this medicine. Men should inform their doctor if they wish to father a child. This medicine may lower sperm counts. Do not treat diarrhea with over the counter products. Contact  your doctor if you have diarrhea that lasts more than 2 days or if it is severe and watery. This medicine can make you more sensitive to the sun. Keep out of the sun. If you cannot avoid being in the sun, wear protective clothing and use sunscreen. Do not use sun lamps or tanning beds/booths. What side effects may I notice from receiving this medicine? Side effects that you should report to your doctor or health care professional as soon as possible: -allergic reactions like skin rash, itching or hives, swelling of the face, lips, or tongue -low blood counts - this medicine may decrease the number of white blood cells, red blood cells and  platelets. You may be at increased risk for infections and bleeding. -signs of infection - fever or chills, cough, sore throat, pain or difficulty passing urine -signs of decreased platelets or bleeding - bruising, pinpoint red spots on the skin, black, tarry stools, blood in the urine -signs of decreased red blood cells - unusually weak or tired, fainting spells, lightheadedness -breathing problems -changes in vision -chest pain -mouth sores -nausea and vomiting -pain, swelling, redness at site where injected -pain, tingling, numbness in the hands or feet -redness, swelling, or sores on hands or feet -stomach pain -unusual bleeding Side effects that usually do not require medical attention (report to your doctor or health care professional if they continue or are bothersome): -changes in finger or toe nails -diarrhea -dry or itchy skin -hair loss -headache -loss of appetite -sensitivity of eyes to the light -stomach upset -unusually teary eyes This list may not describe all possible side effects. Call your doctor for medical advice about side effects. You may report side effects to FDA at 1-800-FDA-1088. Where should I keep my medicine? This drug is given in a hospital or clinic and will not be stored at home. NOTE: This sheet is a summary. It may not cover all possible information. If you have questions about this medicine, talk to your doctor, pharmacist, or health care provider.    2016, Elsevier/Gold Standard. (2007-09-30 13:53:16)

## 2016-01-10 NOTE — Progress Notes (Signed)
Oncology Nurse Navigator Documentation  Oncology Nurse Navigator Flowsheets 01/10/2016  Navigator Location [facility]  Navigator Encounter Type Treatment  Treatment Initiated Date 01/10/2016  Patient Visit Type MedOnc  Treatment Phase First Chemo Tx--FOLFOX   Barriers/Navigation Needs Education-  Education Other--EMLA cream application  Interventions Education Method  Education Method Verbal;Teach-back  Support Groups/Services GI Support Group;Other--[facility] support center calendar  Acuity Level 1  Time Spent with Patient 15  Met with patient during initial chemotherapy treatment to provide support and assess for needs to promote continuity of care. Made him aware of nurse navigator role and provided contact information.  Merceda Elks, RN, BSN GI Oncology [place]

## 2016-01-10 NOTE — Progress Notes (Signed)
OK to treat today with creatinine of 1.7 per Dr. Irene Limbo

## 2016-01-11 ENCOUNTER — Telehealth: Payer: Self-pay | Admitting: Hematology

## 2016-01-11 ENCOUNTER — Telehealth: Payer: Self-pay | Admitting: *Deleted

## 2016-01-11 NOTE — Telephone Encounter (Signed)
[facility] for chemotherapy F/U.  Patient is doing well.  Denies n/v.  Denies any new side effects or symptoms.  Bowel and bladder is functioning well.  "Last night I had a few bouts of what may be diarrhea but it has not continued yet today."  Eating and drinking room temperature beverages well.  Instructed to drink 64 oz minimum daily or at least the day before, of and after treatment.  Denies questions at this time and encouraged to call if needed.  Reviewed how to call after hours in the case of an emergency.

## 2016-01-11 NOTE — Telephone Encounter (Signed)
S/w pt, gv appt 8/16 @ 8am and confirmed 8/8 appt @ 1.30pm.

## 2016-01-12 ENCOUNTER — Ambulatory Visit (HOSPITAL_BASED_OUTPATIENT_CLINIC_OR_DEPARTMENT_OTHER): Payer: Medicare Other

## 2016-01-12 ENCOUNTER — Ambulatory Visit: Payer: Medicare Other | Admitting: Hematology

## 2016-01-12 ENCOUNTER — Other Ambulatory Visit: Payer: Medicare Other

## 2016-01-12 ENCOUNTER — Ambulatory Visit: Payer: Medicare Other

## 2016-01-12 VITALS — BP 156/77 | HR 86 | Temp 98.6°F | Resp 19

## 2016-01-12 DIAGNOSIS — C184 Malignant neoplasm of transverse colon: Secondary | ICD-10-CM

## 2016-01-12 DIAGNOSIS — Z452 Encounter for adjustment and management of vascular access device: Secondary | ICD-10-CM | POA: Diagnosis not present

## 2016-01-12 MED ORDER — SODIUM CHLORIDE 0.9% FLUSH
10.0000 mL | INTRAVENOUS | Status: DC | PRN
  Administered 2016-01-12: 10 mL
  Filled 2016-01-12: qty 10

## 2016-01-12 MED ORDER — HEPARIN SOD (PORK) LOCK FLUSH 100 UNIT/ML IV SOLN
500.0000 [IU] | Freq: Once | INTRAVENOUS | Status: AC | PRN
Start: 2016-01-12 — End: 2016-01-12
  Administered 2016-01-12: 500 [IU]
  Filled 2016-01-12: qty 5

## 2016-01-12 NOTE — Patient Instructions (Signed)

## 2016-01-14 NOTE — Progress Notes (Signed)
Marland Kitchen    HEMATOLOGY/ONCOLOGY CONSULTATION NOTE  Date of Service:01/09/2016  Patient Care Team: [name], MD as PCP - General (Endocrinology)  CHIEF COMPLAINTS/PURPOSE OF CONSULTATION:  Stage III Colon Cancer  HISTORY OF PRESENTING ILLNESS:   Ernest Lara is a wonderful 75 y.o. male who has been referred to [place] by Dr .[name], MD for evaluation and management of Stage III colon cancer.  Patient has a h/o HTN, DM2, OSA , CHB s/p PPM, P afib (on anticoagulation), h/o chronic diastolic Non-ischemic cardiomyopathy, CKD stage 3 who recently presented to the hospital with acute GI bleeding requiring 8 units of PRBC's and 2 units of FFP. He also received IV iron for iron deficiency. He has a GI workup. EGD showed non bleeding helicobacter pylori positive ulceration (patient has completed ABx treatment for this). 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 Patient had a laparoscopic left colectomy.on 11/10/2015. He has been doing well post-operatively but has some superficial open areas at the incision site which have not yet healed. He has a CT chest/abd/pelvis in the hospital that did not show any evidence of distal metastatic disease. Pathology results from the surgical sample showed pT3, pN1c Mx (Stage IIIB) colon cancer MSI stable. Patient is here to discuss adjuvant treatment options. His anticoagulation for on hold for surgery and he is back on Xarelto for his P Afib per cardiology. Notes no neuropathy from his diabetes.   INTERVAL HISTORY  Patient is here for his scheduled follow-up for treatment of his colon cancer. His surgical incision has healed with minimal residual crusting remaining. No fevers or chills. Had a port placed. Has had chemotherapy counseling. Other questions regarding his chemotherapy were answered in detail and he is comfortable with proceeding with planned chemotherapy starting from tomorrow. No other  acute concerns or questions. Bowel movements have been regular. No rectal bleeding noted.   MEDICAL HISTORY:  Past Medical History:  Diagnosis Date  . CKD (chronic kidney disease) stage 3, GFR 30-59 ml/min   . Complete heart block ([place])    s. 05/02/2015 s/p BSX U128 [place] CRT-P Beckie Salts).  Marland Kitchen GIB (gastrointestinal bleeding)    a. 10/2015- Hgb 4.6-->8u PRBC's;  b. 10/2015 EGD: non bleeding H pylori + ulceration; c. 10/2015 Colonoscopy: invasive adenocarcinoma.  . History of cardiomyopathy    a. Nonischemic, possibly tachycardia mediated;  b. 04/2015 Echo:  EF 60-65%, no rwma, mild AI/MR, mod dil LA/RA, PASP 1mHg.  .Marland KitchenHypertension   . Hypertensive heart disease   . OSA (obstructive sleep apnea), pt with apnea during procedures 10/11/2011   Denies  . Paroxysmal atrial fibrillation (HCC)    a. CHA2DS2VASc = 2-3 (Xarelto).  . Stage IIIB Colon Cancer    a. 11/2015 Colonscopy: invasive adenocarcinoma;  b. 11/2015 s/p lap L colectomy; c. Pathology: pT3, pN1c Mx, MSI stable.  . [place] 2 diabetes mellitus ([place]     SURGICAL HISTORY: Past Surgical History:  Procedure Laterality Date  . CARDIAC CATHETERIZATION    . CARDIOVERSION  10/11/2011   Procedure: CARDIOVERSION;  Surgeon: DLeonie Man MD;  [place]  Service: Cardiovascular;  Laterality: N/A;  . COLONOSCOPY N/A 11/05/2015   Procedure: COLONOSCOPY;  Surgeon: RRonald Lobo MD;  [facility];  Service: Endoscopy;  Laterality: N/A;  . EP IMPLANTABLE DEVICE N/A 05/02/2015   Procedure: BiV Pacemaker Insertion CRT-P;  Surgeon: GEvans Lance MD;  [place];  Service: Cardiovascular;  Laterality: N/A;  . ESOPHAGOGASTRODUODENOSCOPY N/A 11/05/2015   Procedure: ESOPHAGOGASTRODUODENOSCOPY (EGD);  Surgeon: Ronald Lobo, MD;  [facility];  Service: Endoscopy;  Laterality: N/A;  . EYE SURGERY  2015   left cataract with lens implant  . LAPAROSCOPIC PARTIAL COLECTOMY N/A 11/10/2015   Procedure:  LAPAROSCOPIC LEFT COLECTOMY LAPAROSCOPIC MOBILIZATION OF SPLENIC FLEXURE;  Surgeon: Greer Pickerel, MD;  Location: [place];  Service: General;  Laterality: N/A;  . Nuclear stress  11/01/2011   Severe global hypokinesis,dilated ventricle  . PORTACATH PLACEMENT N/A 12/29/2015   Procedure: INSERTION PORT-A-CATH WITH ULTRASOUND GUIDANCE;  Surgeon: Greer Pickerel, MD;  Location: [facility];  Service: General;  Laterality: N/A;  . TEE WITHOUT CARDIOVERSION  10/10/2011   Procedure: TRANSESOPHAGEAL ECHOCARDIOGRAM (TEE);  Surgeon: Sanda Klein, MD;  Location: [facility];  Service: Cardiovascular;  Laterality: N/A;    SOCIAL HISTORY: Social History   Social History  . Marital status: Married    Spouse name: N/A  . Number of children: N/A  . Years of education: N/A   Occupational History  . Not on file.   Social History Main Topics  . Smoking status: Never Smoker  . Smokeless tobacco: Never Used  . Alcohol use No  . Drug use: No  . Sexual activity: Not Currently   Other Topics Concern  . Not on file   Social History Narrative  . No narrative on file    FAMILY HISTORY: Family History  Problem Relation Age of Onset  . Colon cancer Mother   . Kidney disease Father     ESRF/HD, deceased    ALLERGIES:  is allergic to amiodarone and allopurinol.  MEDICATIONS:  Current Outpatient Prescriptions  Medication Sig Dispense Refill  . Ascorbic Acid (VITAMIN C) 100 MG tablet Take 100 mg by mouth daily.    Marland Kitchen b complex vitamins capsule Take 1 capsule by mouth daily. 30 capsule 3  . benazepril (LOTENSIN) 40 MG tablet TAKE 1 TABLET (40 MG TOTAL) BY MOUTH DAILY. 30 tablet 6  . carvedilol (COREG) 25 MG tablet TAKE A HALF TABLET (12.5 MG) EVERY MORNING AND A WHOLE TABLET (25 MG) EVERY EVENING 135 tablet 0  . doxazosin (CARDURA) 8 MG tablet TAKE 1 TABLET (8 MG TOTAL) BY MOUTH AT BEDTIME. 30 tablet 9  . ferrous sulfate 325 (65 FE) MG EC tablet Take 1 tablet (325 mg total) by mouth daily with breakfast. 30  tablet 0  . furosemide (LASIX) 80 MG tablet TAKE 1 TABLET (80 MG TOTAL) BY MOUTH DAILY. 60 tablet 1  . KLOR-CON M10 10 MEQ tablet Take 2 tablets by mouth daily.  3  . metFORMIN (GLUCOPHAGE-XR) 750 MG 24 hr tablet Take 1 tablet by mouth daily.    . rosuvastatin (CRESTOR) 20 MG tablet TAKE 1 TABLET (20 MG TOTAL) BY MOUTH AT BEDTIME. 30 tablet 10  . sitaGLIPtin (JANUVIA) 100 MG tablet Take 100 mg by mouth daily.    Alveda Reasons 20 MG TABS tablet TAKE 1 TABLET BY MOUTH DAILY 30 tablet 5  . dexamethasone (DECADRON) 4 MG tablet Take 2 tablets (8 mg total) by mouth daily. Start the day after chemotherapy for 2 days. Take with food. 30 tablet 1  . lidocaine-prilocaine (EMLA) cream Apply to affected area once 30 g 3  . ondansetron (ZOFRAN) 8 MG tablet Take 1 tablet (8 mg total) by mouth 2 (two) times daily as needed for refractory nausea / vomiting. Start on day 3 after chemotherapy. 30 tablet 1  . prochlorperazine (COMPAZINE) 10  MG tablet Take 1 tablet (10 mg total) by mouth every 6 (six) hours as needed (Nausea or vomiting). 30 tablet 1   No current facility-administered medications for this visit.     REVIEW OF SYSTEMS:    10 Point review of Systems was done is negative except as noted above.  PHYSICAL EXAMINATION: ECOG PERFORMANCE STATUS: 2 - Symptomatic, <50% confined to bed  . Vitals:   01/09/16 1457  BP: 138/63  Pulse: 73  Resp: 18  Temp: 97.9 F (36.6 C)   Filed Weights   01/09/16 1457  Weight: 245 lb 4.8 oz (111.3 kg)   .Body mass index is 30.66 kg/m.  GENERAL:alert, in no acute distress and comfortable SKIN: skin color, texture, turgor are normal, no rashes or significant lesions EYES: normal, conjunctiva are pink and non-injected, sclera clear OROPHARYNX:no exudate, no erythema and lips, buccal mucosa, and tongue normal  NECK: supple, no JVD, thyroid normal size, non-tender, without nodularity LYMPH:  no palpable lymphadenopathy in the cervical, axillary or inguinal LUNGS:  clear to auscultation with normal respiratory effort HEART: regular rate & rhythm,  no murmurs and no lower extremity edema ABDOMEN: Abdominal surgical incision has healed with minimal superficial crusting remaining . Musculoskeletal: no cyanosis of digits and no clubbing  PSYCH: alert & oriented x 3 with fluent speech NEURO: no focal motor/sensory deficits  LABORATORY DATA:  I have reviewed the data as listed  . CBC Latest Ref Rng & Units 01/09/2016 12/25/2015 12/06/2015  WBC 4.0 - 10.3 10e3/uL 6.0 5.8 6.0  Hemoglobin 13.0 - 17.1 g/dL 11.5(L) 10.5(L) 10.7(L)  Hematocrit 38.4 - 49.9 % 34.2(L) 32.7(L) 33.5(L)  Platelets 140 - 400 10e3/uL 184 180 232   . CBC    Component Value Date/Time   WBC 6.0 01/09/2016 1532   WBC 5.8 12/25/2015 0840   RBC 4.57 01/09/2016 1532   RBC 4.40 12/25/2015 0840   HGB 11.5 (L) 01/09/2016 1532   HCT 34.2 (L) 01/09/2016 1532   PLT 184 01/09/2016 1532   MCV 74.8 (L) 01/09/2016 1532   MCH 25.2 (L) 01/09/2016 1532   MCH 23.9 (L) 12/25/2015 0840   MCHC 33.6 01/09/2016 1532   MCHC 32.1 12/25/2015 0840   RDW 20.9 (H) 01/09/2016 1532   LYMPHSABS 1.7 01/09/2016 1532   MONOABS 0.5 01/09/2016 1532   EOSABS 0.1 01/09/2016 1532   BASOSABS 0.0 01/09/2016 1532     . CMP Latest Ref Rng & Units 01/09/2016 12/25/2015 12/06/2015  Glucose 70 - 140 mg/dl 104 177(H) 166(H)  BUN 7.0 - 26.0 mg/dL 28.5(H) 23(H) 25.4  Creatinine 0.7 - 1.3 mg/dL 1.7(H) 1.35(H) 1.7(H)  Sodium 136 - 145 mEq/L 141 138 142  Potassium 3.5 - 5.1 mEq/L 4.1 3.9 4.1  Chloride 101 - 111 mmol/L - 106 -  CO2 22 - 29 mEq/L _0 Calcium 8.4 - 10.4 mg/dL 10.6(H) 9.8 10.6(H)  Total Protein 6.4 - 8.3 g/dL 7.7 - 7.8  Total Bilirubin 0.20 - 1.20 mg/dL 0.67 - 0.76  Alkaline Phos 40 - 150 U/L 80 - 84  AST 5 - 34 U/L 24 - 15  ALT 0 - 55 U/L 21 - 10   Component     Latest Ref Rng 11/22/2015  Iron     42 - 163 ug/dL 24 (L)  TIBC     202 - 409 ug/dL 244  UIBC     117 - 376 ug/dL 219  %SAT     20 -  55 % 10 (L)  CEA     0.0 - 4.7 ng/mL 0.9  Ferritin     22 - 316 ng/ml 415 (H)  Vitamin B12     211 - 946 pg/mL 276      RADIOGRAPHIC STUDIES: I have personally reviewed the radiological images as listed and agreed with the findings in the report. Dg Chest Port 1 View  Result Date: 12/29/2015 CLINICAL DATA:  Right port catheter insertion.  Colon cancer. EXAM: PORTABLE CHEST 1 VIEW COMPARISON:  11/08/2015 FINDINGS: Right IJ power port catheter is difficult to visualize and appears superimposed over the pacer wires in the proximal SVC. Mild cardiomegaly with vascular congestion. Negative for CHF or pneumonia. No effusion or pneumothorax. Trachea midline. Left subclavian 3 lead pacer noted. Aortic atherosclerosis present. Trachea is midline. IMPRESSION: Right IJ power port catheter tip superimposed on the pacer wires extends to the proximal SVC. Electronically Signed   By: Jerilynn Mages.  Shick M.D.   On: 12/29/2015 13:11   Dg C-arm 1-60 Min-no Report  Result Date: 12/29/2015 CLINICAL DATA:  Right port catheter insertion.  Colon cancer. EXAM: PORTABLE CHEST 1 VIEW COMPARISON:  11/08/2015 FINDINGS: Right IJ power port catheter is difficult to visualize and appears superimposed over the pacer wires in the proximal SVC. Mild cardiomegaly with vascular congestion. Negative for CHF or pneumonia. No effusion or pneumothorax. Trachea midline. Left subclavian 3 lead pacer noted. Aortic atherosclerosis present. Trachea is midline. IMPRESSION: Right IJ power port catheter tip superimposed on the pacer wires extends to the proximal SVC. Electronically Signed   By: Jerilynn Mages.  Shick M.D.   On: 12/29/2015 13:11   ASSESSMENT & PLAN:   75 yo caucasian male with   1) Stage IIIB (pT3, pN1c, Mx) Colon Adenocarcinoma involving with transverse colon. Patient is s/p left sided colectomy on 11/10/2015 . Surgical incision appears to have nearly completely healed up with only some residual superficial crusting remaining . 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 Patient received 8 units of PRBC and IV iron in the hospital recently. Hgb stable. B12 borderline low Post-operative CEA WNL 3) HTN 4) DM2 5) Non ischemic chronic diastolic CHF 6) Pa fib on Xarelto per cardiology 7) CKD 3 8) Duodenal ulceration (h pylori -positive) - completed Abx for rx. On PPI 9) abdominal wound from his recent laparoscopic colectomy - not completely closed yet. Appears to be healing with no signs of infection at this time. PLAN -patient is about 7 weeks out from surgery and the surgical incisions appeared well. His bowel movements are back to be normal. -He has had his port placed and had his chemotherapy counseling with informed consent -Labs appear stable to proceed with planned adjuvant FOLFOX chemotherapy from tomorrow.  -Residual questions regarding his chemotherapy were answered in details . -continue po replacement of B12/Bcomplex and continue po iron. -continue to maintain ambulation and good po intake -continue f/u with PCP for management of other medical problems.  Return to care in one week with Dr. Irene Limbo for toxicity check with CBC, CMP . Marland Kitchen Orders Placed This Encounter  Procedures  . CEA    Standing Status:   Future    Number of Occurrences:   1    Standing Expiration Date:   01/08/2017  . CBC & Diff and Retic    Standing Status:   Future    Number of Occurrences:   1    Standing Expiration Date:   01/08/2017  . Comprehensive metabolic panel    Standing Status:   Future  Number of Occurrences:   1    Standing Expiration Date:   01/08/2017  . Ferritin    Standing Status:   Future    Number of Occurrences:   1    Standing Expiration Date:   01/08/2017  . CBC & Diff and Retic    Standing Status:   Standing    Number of Occurrences:   12    Standing Expiration Date:   01/08/2017  . Comprehensive metabolic panel    Standing Status:   Standing    Number of Occurrences:   12    Standing Expiration Date:   01/08/2017   I  spent 25 minutes counseling the patient face to face. The total time spent in the appointment was 25 minutes and more than 50% was on counseling and direct patient cares.    Sullivan Lone MD [place] AAHIVMS [facility] Hematology/Oncology Physician [facility]  (Office):       [phone] (Work cell):  [phone] (Fax):           [phone]  01/14/2016 11:50 AM

## 2016-01-16 ENCOUNTER — Telehealth: Payer: Self-pay | Admitting: Hematology

## 2016-01-16 ENCOUNTER — Ambulatory Visit (HOSPITAL_BASED_OUTPATIENT_CLINIC_OR_DEPARTMENT_OTHER): Payer: Medicare Other | Admitting: Hematology

## 2016-01-16 ENCOUNTER — Encounter: Payer: Self-pay | Admitting: Hematology

## 2016-01-16 VITALS — BP 120/63 | HR 79 | Temp 97.7°F | Resp 18 | Ht 75.0 in | Wt 236.1 lb

## 2016-01-16 DIAGNOSIS — E119 Type 2 diabetes mellitus without complications: Secondary | ICD-10-CM | POA: Diagnosis not present

## 2016-01-16 DIAGNOSIS — C184 Malignant neoplasm of transverse colon: Secondary | ICD-10-CM | POA: Diagnosis not present

## 2016-01-16 DIAGNOSIS — I10 Essential (primary) hypertension: Secondary | ICD-10-CM | POA: Diagnosis not present

## 2016-01-16 DIAGNOSIS — D509 Iron deficiency anemia, unspecified: Secondary | ICD-10-CM | POA: Diagnosis not present

## 2016-01-16 DIAGNOSIS — I5032 Chronic diastolic (congestive) heart failure: Secondary | ICD-10-CM

## 2016-01-16 DIAGNOSIS — E538 Deficiency of other specified B group vitamins: Secondary | ICD-10-CM

## 2016-01-16 DIAGNOSIS — I480 Paroxysmal atrial fibrillation: Secondary | ICD-10-CM

## 2016-01-16 DIAGNOSIS — N183 Chronic kidney disease, stage 3 (moderate): Secondary | ICD-10-CM

## 2016-01-16 NOTE — Telephone Encounter (Signed)
Gave pt cal & avs °

## 2016-01-20 ENCOUNTER — Encounter: Payer: Self-pay | Admitting: Hematology

## 2016-01-20 ENCOUNTER — Other Ambulatory Visit: Payer: Self-pay | Admitting: Hematology

## 2016-01-20 NOTE — Progress Notes (Signed)
Marland Kitchen    HEMATOLOGY/ONCOLOGY CONSULTATION NOTE  Date of Service:01/16/2016  Patient Care Team: [name], MD as PCP - General (Endocrinology)  CHIEF COMPLAINTS/PURPOSE OF CONSULTATION:  Stage III Colon Cancer  HISTORY OF PRESENTING ILLNESS:   Ernest Lara is a wonderful 75 y.o. male who has been referred to [place] by Dr .[name], MD for evaluation and management of Stage III colon cancer.  Patient has a h/o HTN, DM2, OSA , CHB s/p PPM, P afib (on anticoagulation), h/o chronic diastolic Non-ischemic cardiomyopathy, CKD stage 3 who recently presented to the hospital with acute GI bleeding requiring 8 units of PRBC's and 2 units of FFP. He also received IV iron for iron deficiency. He has a GI workup. EGD showed non bleeding helicobacter pylori positive ulceration (patient has completed ABx treatment for this). 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 Patient had a laparoscopic left colectomy.on 11/10/2015. He has been doing well post-operatively but has some superficial open areas at the incision site which have not yet healed. He has a CT chest/abd/pelvis in the hospital that did not show any evidence of distal metastatic disease. Pathology results from the surgical sample showed pT3, pN1c Mx (Stage IIIB) colon cancer MSI stable. Patient is here to discuss adjuvant treatment options. His anticoagulation for on hold for surgery and he is back on Xarelto for his P Afib per cardiology. Notes no neuropathy from his diabetes.   INTERVAL HISTORY  Patient is here for his scheduled follow-up a toxicity check after his 1st dose of adjuvant FOLFOX chemotherapy. He notes  A mild metallic taste but no other bothersome symptoms. No nausea/vomiting. No fevers/chills/skin rashes.  MEDICAL HISTORY:  Past Medical History:  Diagnosis Date  . CKD (chronic kidney disease) stage 3, GFR 30-59 ml/min   . Complete heart block ([place])    s.  05/02/2015 s/p BSX U128 [place] CRT-P Beckie Salts).  Marland Kitchen GIB (gastrointestinal bleeding)    a. 10/2015- Hgb 4.6-->8u PRBC's;  b. 10/2015 EGD: non bleeding H pylori + ulceration; c. 10/2015 Colonoscopy: invasive adenocarcinoma.  . History of cardiomyopathy    a. Nonischemic, possibly tachycardia mediated;  b. 04/2015 Echo:  EF 60-65%, no rwma, mild AI/MR, mod dil LA/RA, PASP 6mHg.  .Marland KitchenHypertension   . Hypertensive heart disease   . OSA (obstructive sleep apnea), pt with apnea during procedures 10/11/2011   Denies  . Paroxysmal atrial fibrillation (H[facility])    a. CHA2DS2VASc = 2-3 (Xarelto).  . Stage IIIB Colon Cancer    a. 11/2015 Colonscopy: invasive adenocarcinoma;  b. 11/2015 s/p lap L colectomy; c. Pathology: pT3, pN1c Mx, MSI stable.  . Type 2 diabetes mellitus ([place]     SURGICAL HISTORY: Past Surgical History:  Procedure Laterality Date  . CARDIAC CATHETERIZATION    . CARDIOVERSION  10/11/2011   Procedure: CARDIOVERSION;  Surgeon: DLeonie Man MD;  [facility]  Service: Cardiovascular;  Laterality: N/A;  . COLONOSCOPY N/A 11/05/2015   Procedure: COLONOSCOPY;  Surgeon: RRonald Lobo MD;  [facility];  Service: Endoscopy;  Laterality: N/A;  . EP IMPLANTABLE DEVICE N/A 05/02/2015   Procedure: BiV Pacemaker Insertion CRT-P;  Surgeon: GEvans Lance MD;  [facility];  Service: Cardiovascular;  Laterality: N/A;  . ESOPHAGOGASTRODUODENOSCOPY N/A 11/05/2015   Procedure: ESOPHAGOGASTRODUODENOSCOPY (EGD);  Surgeon: RRonald Lobo MD;  [facility];  Service: Endoscopy;  Laterality: N/A;  . EYE SURGERY  2015   left cataract  with lens implant  . LAPAROSCOPIC PARTIAL COLECTOMY N/A 11/10/2015   Procedure: LAPAROSCOPIC LEFT COLECTOMY LAPAROSCOPIC MOBILIZATION OF SPLENIC FLEXURE;  Surgeon: Greer Pickerel, MD;  Location: [facility];  Service: General;  Laterality: N/A;  . Nuclear stress  11/01/2011   Severe global hypokinesis,dilated ventricle  . PORTACATH PLACEMENT N/A  12/29/2015   Procedure: INSERTION PORT-A-CATH WITH ULTRASOUND GUIDANCE;  Surgeon: Greer Pickerel, MD;  Location: [facility];  Service: General;  Laterality: N/A;  . TEE WITHOUT CARDIOVERSION  10/10/2011   Procedure: TRANSESOPHAGEAL ECHOCARDIOGRAM (TEE);  Surgeon: Sanda Klein, MD;  Location[facility];  Service: Cardiovascular;  Laterality: N/A;    SOCIAL HISTORY: Social History   Social History  . Marital status: Married    Spouse name: N/A  . Number of children: N/A  . Years of education: N/A   Occupational History  . Not on file.   Social History Main Topics  . Smoking status: Never Smoker  . Smokeless tobacco: Never Used  . Alcohol use No  . Drug use: No  . Sexual activity: Not Currently   Other Topics Concern  . Not on file   Social History Narrative  . No narrative on file    FAMILY HISTORY: Family History  Problem Relation Age of Onset  . Colon cancer Mother   . Kidney disease Father     ESRF/HD, deceased    ALLERGIES:  is allergic to amiodarone and allopurinol.  MEDICATIONS:  Current Outpatient Prescriptions  Medication Sig Dispense Refill  . Ascorbic Acid (VITAMIN C) 100 MG tablet Take 100 mg by mouth daily.    Marland Kitchen b complex vitamins capsule Take 1 capsule by mouth daily. 30 capsule 3  . benazepril (LOTENSIN) 40 MG tablet TAKE 1 TABLET (40 MG TOTAL) BY MOUTH DAILY. 30 tablet 6  . carvedilol (COREG) 25 MG tablet TAKE A HALF TABLET (12.5 MG) EVERY MORNING AND A WHOLE TABLET (25 MG) EVERY EVENING 135 tablet 0  . dexamethasone (DECADRON) 4 MG tablet Take 2 tablets (8 mg total) by mouth daily. Start the day after chemotherapy for 2 days. Take with food. 30 tablet 1  . doxazosin (CARDURA) 8 MG tablet TAKE 1 TABLET (8 MG TOTAL) BY MOUTH AT BEDTIME. 30 tablet 9  . ferrous sulfate 325 (65 FE) MG EC tablet Take 1 tablet (325 mg total) by mouth daily with breakfast. 30 tablet 0  . furosemide (LASIX) 80 MG tablet TAKE 1 TABLET (80 MG TOTAL) BY MOUTH DAILY. 60 tablet 1  .  KLOR-CON M10 10 MEQ tablet Take 2 tablets by mouth daily.  3  . lidocaine-prilocaine (EMLA) cream Apply to affected area once 30 g 3  . metFORMIN (GLUCOPHAGE-XR) 750 MG 24 hr tablet Take 1 tablet by mouth daily.    . ondansetron (ZOFRAN) 8 MG tablet Take 1 tablet (8 mg total) by mouth 2 (two) times daily as needed for refractory nausea / vomiting. Start on day 3 after chemotherapy. 30 tablet 1  . prochlorperazine (COMPAZINE) 10 MG tablet Take 1 tablet (10 mg total) by mouth every 6 (six) hours as needed (Nausea or vomiting). 30 tablet 1  . rosuvastatin (CRESTOR) 20 MG tablet TAKE 1 TABLET (20 MG TOTAL) BY MOUTH AT BEDTIME. 30 tablet 10  . sitaGLIPtin (JANUVIA) 100 MG tablet Take 100 mg by mouth daily.    Alveda Reasons 20 MG TABS tablet TAKE 1 TABLET BY MOUTH DAILY 30 tablet 5   No current facility-administered medications for this visit.     REVIEW OF  SYSTEMS:    10 Point review of Systems was done is negative except as noted above.  PHYSICAL EXAMINATION: ECOG PERFORMANCE STATUS: 2 - Symptomatic, <50% confined to bed  . Vitals:   01/16/16 1356  BP: 120/63  Pulse: 79  Resp: 18  Temp: 97.7 F (36.5 C)   Filed Weights   01/16/16 1356  Weight: 236 lb 1.6 oz (107.1 kg)   .Body mass index is 29.51 kg/m.  GENERAL:alert, in no acute distress and comfortable SKIN: skin color, texture, turgor are normal, no rashes or significant lesions EYES: normal, conjunctiva are pink and non-injected, sclera clear OROPHARYNX:no exudate, no erythema and lips, buccal mucosa, and tongue normal  NECK: supple, no JVD, thyroid normal size, non-tender, without nodularity LYMPH:  no palpable lymphadenopathy in the cervical, axillary or inguinal LUNGS: clear to auscultation with normal respiratory effort HEART: regular rate & rhythm,  no murmurs and no lower extremity edema ABDOMEN: Abdominal surgical incision has healed with minimal superficial crusting remaining . Musculoskeletal: no cyanosis of digits and  no clubbing  PSYCH: alert & oriented x 3 with fluent speech NEURO: no focal motor/sensory deficits  LABORATORY DATA:  I have reviewed the data as listed  . CBC Latest Ref Rng & Units 01/09/2016 12/25/2015 12/06/2015  WBC 4.0 - 10.3 10e3/uL 6.0 5.8 6.0  Hemoglobin 13.0 - 17.1 g/dL 11.5(L) 10.5(L) 10.7(L)  Hematocrit 38.4 - 49.9 % 34.2(L) 32.7(L) 33.5(L)  Platelets 140 - 400 10e3/uL 184 180 232   . CBC    Component Value Date/Time   WBC 6.0 01/09/2016 1532   WBC 5.8 12/25/2015 0840   RBC 4.57 01/09/2016 1532   RBC 4.40 12/25/2015 0840   HGB 11.5 (L) 01/09/2016 1532   HCT 34.2 (L) 01/09/2016 1532   PLT 184 01/09/2016 1532   MCV 74.8 (L) 01/09/2016 1532   MCH 25.2 (L) 01/09/2016 1532   MCH 23.9 (L) 12/25/2015 0840   MCHC 33.6 01/09/2016 1532   MCHC 32.1 12/25/2015 0840   RDW 20.9 (H) 01/09/2016 1532   LYMPHSABS 1.7 01/09/2016 1532   MONOABS 0.5 01/09/2016 1532   EOSABS 0.1 01/09/2016 1532   BASOSABS 0.0 01/09/2016 1532     . CMP Latest Ref Rng & Units 01/09/2016 12/25/2015 12/06/2015  Glucose 70 - 140 mg/dl 104 177(H) 166(H)  BUN 7.0 - 26.0 mg/dL 28.5(H) 23(H) 25.4  Creatinine 0.7 - 1.3 mg/dL 1.7(H) 1.35(H) 1.7(H)  Sodium 136 - 145 mEq/L 141 138 142  Potassium 3.5 - 5.1 mEq/L 4.1 3.9 4.1  Chloride 101 - 111 mmol/L - 106 -  CO2 22 - 29 mEq/L 27 26 29   Calcium 8.4 - 10.4 mg/dL 10.6(H) 9.8 10.6(H)  Total Protein 6.4 - 8.3 g/dL 7.7 - 7.8  Total Bilirubin 0.20 - 1.20 mg/dL 0.67 - 0.76  Alkaline Phos 40 - 150 U/L 80 - 84  AST 5 - 34 U/L 24 - 15  ALT 0 - 55 U/L 21 - 10   Component     Latest Ref Rng 11/22/2015  Iron     42 - 163 ug/dL 24 (L)  TIBC     202 - 409 ug/dL 244  UIBC     117 - 376 ug/dL 219  %SAT     20 - 55 % 10 (L)  CEA     0.0 - 4.7 ng/mL 0.9  Ferritin     22 - 316 ng/ml 415 (H)  Vitamin B12     211 - 946 pg/mL 276  RADIOGRAPHIC STUDIES: I have personally reviewed the radiological images as listed and agreed with the findings in the report. Dg  Chest Port 1 View  Result Date: 12/29/2015 CLINICAL DATA:  Right port catheter insertion.  Colon cancer. EXAM: PORTABLE CHEST 1 VIEW COMPARISON:  11/08/2015 FINDINGS: Right IJ power port catheter is difficult to visualize and appears superimposed over the pacer wires in the proximal SVC. Mild cardiomegaly with vascular congestion. Negative for CHF or pneumonia. No effusion or pneumothorax. Trachea midline. Left subclavian 3 lead pacer noted. Aortic atherosclerosis present. Trachea is midline. IMPRESSION: Right IJ power port catheter tip superimposed on the pacer wires extends to the proximal SVC. Electronically Signed   By: Jerilynn Mages.  Shick M.D.   On: 12/29/2015 13:11   Dg C-arm 1-60 Min-no Report  Result Date: 12/29/2015 CLINICAL DATA:  Right port catheter insertion.  Colon cancer. EXAM: PORTABLE CHEST 1 VIEW COMPARISON:  11/08/2015 FINDINGS: Right IJ power port catheter is difficult to visualize and appears superimposed over the pacer wires in the proximal SVC. Mild cardiomegaly with vascular congestion. Negative for CHF or pneumonia. No effusion or pneumothorax. Trachea midline. Left subclavian 3 lead pacer noted. Aortic atherosclerosis present. Trachea is midline. IMPRESSION: Right IJ power port catheter tip superimposed on the pacer wires extends to the proximal SVC. Electronically Signed   By: Jerilynn Mages.  Shick M.D.   On: 12/29/2015 13:11   ASSESSMENT & PLAN:   75 yo caucasian male with   1) Stage IIIB (pT3, pN1c, Mx) Colon Adenocarcinoma involving with transverse colon. Patient is s/p left sided colectomy on 11/10/2015 . Surgical incision appears to have nearly completely healed up with only some residual superficial crusting remaining . 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 Patient received 8 units of PRBC and IV iron in the hospital recently. Hgb improving. B12 borderline low Post-operative CEA WNL 3) HTN 4) DM2 5) Non ischemic chronic diastolic CHF 6) Pa fib on Xarelto per cardiology 7) CKD  3 8) Duodenal ulceration (h pylori -positive) - completed Abx for rx. On PPI 9) abdominal wound from his recent laparoscopic colectomy - not completely closed yet. Appears to be healing with no signs of infection at this time. PLAN --patient appears to have tolerated the 1st dose of FOLFOX without any prohibitive toxicities. -continue planned adjuvant FOLFOX at this time. -RTC with Dr Irene Limbo prior to [facility] in 3 weeks for reassessment. Earlier if any new concerns. -continue po replacement of B12/Bcomplex and continue po iron. -continue to maintain ambulation and good po intake -continue f/u with PCP for management of other medical problems.  Return to care in 3 week with Dr. Irene Limbo with labs prior to C3 of FOLFOX. Continue adjuvant FOLFOX as per plan. . No orders of the defined types were placed in this encounter.  I spent 25 minutes counseling the patient face to face. The total time spent in the appointment was 25 minutes and more than 50% was on counseling and direct patient cares.    Sullivan Lone MD [place] AAHIVMS [facility] Hematology/Oncology Physician [facility]  (Office):       [phone] (Work cell):  [phone] (Fax):           [phone]

## 2016-01-24 ENCOUNTER — Other Ambulatory Visit: Payer: Self-pay | Admitting: Cardiovascular Disease

## 2016-01-24 ENCOUNTER — Other Ambulatory Visit (HOSPITAL_BASED_OUTPATIENT_CLINIC_OR_DEPARTMENT_OTHER): Payer: Medicare Other

## 2016-01-24 ENCOUNTER — Ambulatory Visit (HOSPITAL_BASED_OUTPATIENT_CLINIC_OR_DEPARTMENT_OTHER): Payer: Medicare Other

## 2016-01-24 VITALS — BP 116/59 | HR 71 | Temp 97.8°F | Resp 18

## 2016-01-24 DIAGNOSIS — Z5111 Encounter for antineoplastic chemotherapy: Secondary | ICD-10-CM | POA: Diagnosis not present

## 2016-01-24 DIAGNOSIS — E538 Deficiency of other specified B group vitamins: Secondary | ICD-10-CM

## 2016-01-24 DIAGNOSIS — Z95828 Presence of other vascular implants and grafts: Secondary | ICD-10-CM | POA: Insufficient documentation

## 2016-01-24 DIAGNOSIS — C184 Malignant neoplasm of transverse colon: Secondary | ICD-10-CM

## 2016-01-24 DIAGNOSIS — D509 Iron deficiency anemia, unspecified: Secondary | ICD-10-CM | POA: Diagnosis not present

## 2016-01-24 LAB — COMPREHENSIVE METABOLIC PANEL
ALT: 16 U/L (ref 0–55)
AST: 18 U/L (ref 5–34)
Albumin: 3 g/dL — ABNORMAL LOW (ref 3.5–5.0)
Alkaline Phosphatase: 80 U/L (ref 40–150)
Anion Gap: 6 mEq/L (ref 3–11)
BUN: 19.4 mg/dL (ref 7.0–26.0)
CO2: 28 mEq/L (ref 22–29)
Calcium: 9.8 mg/dL (ref 8.4–10.4)
Chloride: 105 mEq/L (ref 98–109)
Creatinine: 1.5 mg/dL — ABNORMAL HIGH (ref 0.7–1.3)
EGFR: 53 mL/min/{1.73_m2} — ABNORMAL LOW (ref >90)
Glucose: 255 mg/dl — ABNORMAL HIGH (ref 70–140)
Potassium: 4.1 mEq/L (ref 3.5–5.1)
Sodium: 139 mEq/L (ref 136–145)
Total Bilirubin: 0.76 mg/dL (ref 0.20–1.20)
Total Protein: 6.4 g/dL (ref 6.4–8.3)

## 2016-01-24 LAB — CBC & DIFF AND RETIC
BASO%: 0.7 % (ref 0.0–2.0)
Basophils Absolute: 0 10*3/uL (ref 0.0–0.1)
EOS%: 2.4 % (ref 0.0–7.0)
Eosinophils Absolute: 0.1 10*3/uL (ref 0.0–0.5)
HCT: 31.1 % — ABNORMAL LOW (ref 38.4–49.9)
HGB: 10.4 g/dL — ABNORMAL LOW (ref 13.0–17.1)
Immature Retic Fract: 7.6 % (ref 3.00–10.60)
LYMPH%: 22.8 % (ref 14.0–49.0)
MCH: 25.1 pg — ABNORMAL LOW (ref 27.2–33.4)
MCHC: 33.4 g/dL (ref 32.0–36.0)
MCV: 74.9 fL — ABNORMAL LOW (ref 79.3–98.0)
MONO#: 0.4 10*3/uL (ref 0.1–0.9)
MONO%: 8.7 % (ref 0.0–14.0)
NEUT#: 2.8 10*3/uL (ref 1.5–6.5)
NEUT%: 65.4 % (ref 39.0–75.0)
Platelets: 112 10*3/uL — ABNORMAL LOW (ref 140–400)
RBC: 4.15 10*6/uL — ABNORMAL LOW (ref 4.20–5.82)
RDW: 19.3 % — ABNORMAL HIGH (ref 11.0–14.6)
Retic %: 1.08 % (ref 0.80–1.80)
Retic Ct Abs: 44.82 10*3/uL (ref 34.80–93.90)
WBC: 4.3 10*3/uL (ref 4.0–10.3)
lymph#: 1 10*3/uL (ref 0.9–3.3)

## 2016-01-24 LAB — FERRITIN: Ferritin: 100 ng/ml (ref 22–316)

## 2016-01-24 MED ORDER — SODIUM CHLORIDE 0.9% FLUSH
10.0000 mL | INTRAVENOUS | Status: DC | PRN
  Filled 2016-01-24: qty 10

## 2016-01-24 MED ORDER — SODIUM CHLORIDE 0.9 % IV SOLN
10.0000 mg | Freq: Once | INTRAVENOUS | Status: AC
  Administered 2016-01-24: 10 mg via INTRAVENOUS
  Filled 2016-01-24: qty 1

## 2016-01-24 MED ORDER — LEUCOVORIN CALCIUM INJECTION 350 MG
400.0000 mg/m2 | Freq: Once | INTRAVENOUS | Status: AC
  Administered 2016-01-24: 972 mg via INTRAVENOUS
  Filled 2016-01-24: qty 48.6

## 2016-01-24 MED ORDER — HEPARIN SOD (PORK) LOCK FLUSH 100 UNIT/ML IV SOLN
500.0000 [IU] | Freq: Once | INTRAVENOUS | Status: DC | PRN
  Filled 2016-01-24: qty 5

## 2016-01-24 MED ORDER — PALONOSETRON HCL INJECTION 0.25 MG/5ML
0.2500 mg | Freq: Once | INTRAVENOUS | Status: AC
  Administered 2016-01-24: 0.25 mg via INTRAVENOUS

## 2016-01-24 MED ORDER — OXALIPLATIN CHEMO INJECTION 100 MG/20ML
83.0000 mg/m2 | Freq: Once | INTRAVENOUS | Status: AC
  Administered 2016-01-24: 200 mg via INTRAVENOUS
  Filled 2016-01-24: qty 40

## 2016-01-24 MED ORDER — DEXTROSE 5 % IV SOLN
Freq: Once | INTRAVENOUS | Status: AC
  Administered 2016-01-24: 11:00:00 via INTRAVENOUS

## 2016-01-24 MED ORDER — FLUOROURACIL CHEMO INJECTION 2.5 GM/50ML
400.0000 mg/m2 | Freq: Once | INTRAVENOUS | Status: AC
  Administered 2016-01-24: 950 mg via INTRAVENOUS
  Filled 2016-01-24: qty 19

## 2016-01-24 MED ORDER — PALONOSETRON HCL INJECTION 0.25 MG/5ML
INTRAVENOUS | Status: AC
  Filled 2016-01-24: qty 5

## 2016-01-24 MED ORDER — SODIUM CHLORIDE 0.9 % IV SOLN
2400.0000 mg/m2 | INTRAVENOUS | Status: DC
  Administered 2016-01-24: 5850 mg via INTRAVENOUS
  Filled 2016-01-24: qty 117

## 2016-01-24 NOTE — Telephone Encounter (Signed)
Rx(s) sent to pharmacy electronically.  

## 2016-01-24 NOTE — Patient Instructions (Signed)
[facility] Discharge Instructions for Patients Receiving Chemotherapy  Today you received the following chemotherapy agents: Oxaliplatin, Leucovorin, Adruicil.  To help prevent nausea and vomiting after your treatment, we encourage you to take your nausea medication as directed. If you develop nausea and vomiting that is not controlled by your nausea medication, call the clinic.   BELOW ARE SYMPTOMS THAT SHOULD BE REPORTED IMMEDIATELY:  *FEVER GREATER THAN 100.5 F  *CHILLS WITH OR WITHOUT FEVER  NAUSEA AND VOMITING THAT IS NOT CONTROLLED WITH YOUR NAUSEA MEDICATION  *UNUSUAL SHORTNESS OF BREATH  *UNUSUAL BRUISING OR BLEEDING  TENDERNESS IN MOUTH AND THROAT WITH OR WITHOUT PRESENCE OF ULCERS  *URINARY PROBLEMS  *BOWEL PROBLEMS  UNUSUAL RASH Items with * indicate a potential emergency and should be followed up as soon as possible.  Feel free to call the clinic you have any questions or concerns. The clinic phone number is [phone].  Please show the [facility] at check-in to the Emergency Department and triage nurse.

## 2016-01-25 LAB — VITAMIN B12: VITAMIN B 12: 444 pg/mL (ref 211–946)

## 2016-01-26 ENCOUNTER — Ambulatory Visit (HOSPITAL_BASED_OUTPATIENT_CLINIC_OR_DEPARTMENT_OTHER): Payer: Medicare Other

## 2016-01-26 VITALS — BP 150/67 | HR 78 | Temp 98.5°F | Resp 18 | Wt 237.8 lb

## 2016-01-26 DIAGNOSIS — C184 Malignant neoplasm of transverse colon: Secondary | ICD-10-CM | POA: Diagnosis not present

## 2016-01-26 DIAGNOSIS — C189 Malignant neoplasm of colon, unspecified: Secondary | ICD-10-CM

## 2016-01-26 DIAGNOSIS — Z452 Encounter for adjustment and management of vascular access device: Secondary | ICD-10-CM | POA: Diagnosis not present

## 2016-01-26 DIAGNOSIS — Z95828 Presence of other vascular implants and grafts: Secondary | ICD-10-CM

## 2016-01-26 MED ORDER — HEPARIN SOD (PORK) LOCK FLUSH 100 UNIT/ML IV SOLN
500.0000 [IU] | Freq: Once | INTRAVENOUS | Status: AC | PRN
  Administered 2016-01-26: 500 [IU] via INTRAVENOUS
  Filled 2016-01-26: qty 5

## 2016-01-26 MED ORDER — SODIUM CHLORIDE 0.9 % IJ SOLN
10.0000 mL | INTRAMUSCULAR | Status: DC | PRN
  Administered 2016-01-26: 10 mL via INTRAVENOUS
  Filled 2016-01-26: qty 10

## 2016-02-07 ENCOUNTER — Ambulatory Visit (HOSPITAL_BASED_OUTPATIENT_CLINIC_OR_DEPARTMENT_OTHER): Payer: Medicare Other | Admitting: Nurse Practitioner

## 2016-02-07 ENCOUNTER — Ambulatory Visit (HOSPITAL_BASED_OUTPATIENT_CLINIC_OR_DEPARTMENT_OTHER): Payer: Medicare Other

## 2016-02-07 ENCOUNTER — Encounter: Payer: Self-pay | Admitting: Nurse Practitioner

## 2016-02-07 ENCOUNTER — Other Ambulatory Visit (HOSPITAL_BASED_OUTPATIENT_CLINIC_OR_DEPARTMENT_OTHER): Payer: Medicare Other

## 2016-02-07 VITALS — BP 127/62 | HR 67 | Temp 97.7°F | Resp 18 | Ht 75.0 in | Wt 241.3 lb

## 2016-02-07 DIAGNOSIS — Z452 Encounter for adjustment and management of vascular access device: Secondary | ICD-10-CM

## 2016-02-07 DIAGNOSIS — C184 Malignant neoplasm of transverse colon: Secondary | ICD-10-CM

## 2016-02-07 DIAGNOSIS — C189 Malignant neoplasm of colon, unspecified: Secondary | ICD-10-CM | POA: Diagnosis not present

## 2016-02-07 DIAGNOSIS — T451X5A Adverse effect of antineoplastic and immunosuppressive drugs, initial encounter: Secondary | ICD-10-CM

## 2016-02-07 DIAGNOSIS — Z7901 Long term (current) use of anticoagulants: Secondary | ICD-10-CM | POA: Insufficient documentation

## 2016-02-07 DIAGNOSIS — N189 Chronic kidney disease, unspecified: Secondary | ICD-10-CM | POA: Diagnosis not present

## 2016-02-07 DIAGNOSIS — Z5111 Encounter for antineoplastic chemotherapy: Secondary | ICD-10-CM | POA: Diagnosis not present

## 2016-02-07 DIAGNOSIS — D509 Iron deficiency anemia, unspecified: Secondary | ICD-10-CM

## 2016-02-07 DIAGNOSIS — D701 Agranulocytosis secondary to cancer chemotherapy: Secondary | ICD-10-CM | POA: Diagnosis not present

## 2016-02-07 DIAGNOSIS — E119 Type 2 diabetes mellitus without complications: Secondary | ICD-10-CM

## 2016-02-07 DIAGNOSIS — N289 Disorder of kidney and ureter, unspecified: Secondary | ICD-10-CM

## 2016-02-07 DIAGNOSIS — Z95828 Presence of other vascular implants and grafts: Secondary | ICD-10-CM

## 2016-02-07 DIAGNOSIS — Z9189 Other specified personal risk factors, not elsewhere classified: Secondary | ICD-10-CM

## 2016-02-07 DIAGNOSIS — E118 Type 2 diabetes mellitus with unspecified complications: Secondary | ICD-10-CM

## 2016-02-07 LAB — COMPREHENSIVE METABOLIC PANEL
ALT: 17 U/L (ref 0–55)
ANION GAP: 10 meq/L (ref 3–11)
AST: 19 U/L (ref 5–34)
Albumin: 3 g/dL — ABNORMAL LOW (ref 3.5–5.0)
Alkaline Phosphatase: 82 U/L (ref 40–150)
BILIRUBIN TOTAL: 0.89 mg/dL (ref 0.20–1.20)
BUN: 19 mg/dL (ref 7.0–26.0)
CALCIUM: 10.4 mg/dL (ref 8.4–10.4)
CO2: 27 meq/L (ref 22–29)
CREATININE: 1.8 mg/dL — AB (ref 0.7–1.3)
Chloride: 101 mEq/L (ref 98–109)
EGFR: 41 mL/min/{1.73_m2} — ABNORMAL LOW (ref >90)
Glucose: 292 mg/dl — ABNORMAL HIGH (ref 70–140)
Potassium: 4.1 mEq/L (ref 3.5–5.1)
Sodium: 137 mEq/L (ref 136–145)
TOTAL PROTEIN: 6.7 g/dL (ref 6.4–8.3)

## 2016-02-07 LAB — CBC & DIFF AND RETIC
BASO%: 0.9 % (ref 0.0–2.0)
BASOS ABS: 0 10*3/uL (ref 0.0–0.1)
EOS%: 0.9 % (ref 0.0–7.0)
Eosinophils Absolute: 0 10*3/uL (ref 0.0–0.5)
HEMATOCRIT: 31.8 % — AB (ref 38.4–49.9)
HGB: 10.8 g/dL — ABNORMAL LOW (ref 13.0–17.1)
IMMATURE RETIC FRACT: 16.3 % — AB (ref 3.00–10.60)
LYMPH#: 0.9 10*3/uL (ref 0.9–3.3)
LYMPH%: 40.4 % (ref 14.0–49.0)
MCH: 25.6 pg — ABNORMAL LOW (ref 27.2–33.4)
MCHC: 34 g/dL (ref 32.0–36.0)
MCV: 75.4 fL — ABNORMAL LOW (ref 79.3–98.0)
MONO#: 0.3 10*3/uL (ref 0.1–0.9)
MONO%: 13.3 % (ref 0.0–14.0)
NEUT#: 1 10*3/uL — ABNORMAL LOW (ref 1.5–6.5)
NEUT%: 44.5 % (ref 39.0–75.0)
PLATELETS: 102 10*3/uL — AB (ref 140–400)
RBC: 4.22 10*6/uL (ref 4.20–5.82)
RDW: 17.9 % — ABNORMAL HIGH (ref 11.0–14.6)
RETIC CT ABS: 76.8 10*3/uL (ref 34.80–93.90)
Retic %: 1.82 % — ABNORMAL HIGH (ref 0.80–1.80)
WBC: 2.3 10*3/uL — ABNORMAL LOW (ref 4.0–10.3)

## 2016-02-07 MED ORDER — ALTEPLASE 2 MG IJ SOLR
2.0000 mg | Freq: Once | INTRAMUSCULAR | Status: AC | PRN
  Administered 2016-02-07: 2 mg
  Filled 2016-02-07: qty 2

## 2016-02-07 MED ORDER — DEXTROSE 5 % IV SOLN
400.0000 mg/m2 | Freq: Once | INTRAVENOUS | Status: AC
  Administered 2016-02-07: 972 mg via INTRAVENOUS
  Filled 2016-02-07: qty 48.6

## 2016-02-07 MED ORDER — DEXAMETHASONE SODIUM PHOSPHATE 100 MG/10ML IJ SOLN
10.0000 mg | Freq: Once | INTRAMUSCULAR | Status: AC
  Administered 2016-02-07: 10 mg via INTRAVENOUS
  Filled 2016-02-07: qty 1

## 2016-02-07 MED ORDER — INSULIN REGULAR HUMAN 100 UNIT/ML IJ SOLN
10.0000 [IU] | Freq: Once | INTRAMUSCULAR | Status: AC
  Administered 2016-02-07: 10 [IU] via SUBCUTANEOUS
  Filled 2016-02-07: qty 0.1

## 2016-02-07 MED ORDER — SODIUM CHLORIDE 0.9% FLUSH
10.0000 mL | INTRAVENOUS | Status: DC | PRN
  Filled 2016-02-07: qty 10

## 2016-02-07 MED ORDER — HEPARIN SOD (PORK) LOCK FLUSH 100 UNIT/ML IV SOLN
500.0000 [IU] | Freq: Once | INTRAVENOUS | Status: DC | PRN
  Filled 2016-02-07: qty 5

## 2016-02-07 MED ORDER — PALONOSETRON HCL INJECTION 0.25 MG/5ML
0.2500 mg | Freq: Once | INTRAVENOUS | Status: AC
  Administered 2016-02-07: 0.25 mg via INTRAVENOUS

## 2016-02-07 MED ORDER — PALONOSETRON HCL INJECTION 0.25 MG/5ML
INTRAVENOUS | Status: AC
  Filled 2016-02-07: qty 5

## 2016-02-07 MED ORDER — OXALIPLATIN CHEMO INJECTION 100 MG/20ML
83.0000 mg/m2 | Freq: Once | INTRAVENOUS | Status: AC
  Administered 2016-02-07: 200 mg via INTRAVENOUS
  Filled 2016-02-07: qty 40

## 2016-02-07 MED ORDER — DEXTROSE 5 % IV SOLN
Freq: Once | INTRAVENOUS | Status: AC
  Administered 2016-02-07: 11:00:00 via INTRAVENOUS

## 2016-02-07 MED ORDER — SODIUM CHLORIDE 0.9 % IJ SOLN
10.0000 mL | INTRAMUSCULAR | Status: DC | PRN
  Administered 2016-02-07: 10 mL via INTRAVENOUS
  Filled 2016-02-07: qty 10

## 2016-02-07 MED ORDER — SODIUM CHLORIDE 0.9 % IV SOLN
2400.0000 mg/m2 | INTRAVENOUS | Status: DC
  Administered 2016-02-07: 5850 mg via INTRAVENOUS
  Filled 2016-02-07: qty 117

## 2016-02-07 NOTE — Assessment & Plan Note (Signed)
Patient received cycle 2 of her FOLFOX chemotherapy on 01/24/2016.  Today's labs revealed CBC.  2.3 and ANC down to 1.0.  Patient remains afebrile.  Reviewed all findings with Dr. Irene Limbo; and he recommended holding the 5-FU bolus; but proceeding with chemotherapy otherwise.   We'll continue to monitor closely.

## 2016-02-07 NOTE — Assessment & Plan Note (Signed)
Patient presented to the [facility] today to receive cycle 3 of his FOLFOX chemotherapy regimen.  He continues to tolerate her chemotherapy fairly well; his only complaint being a metallic taste in his mouth.  He denies any recent fevers or chills.  Blood counts obtained today reveal a WBC of 2.3, ANC 1.0, medical and 10.8, platelet count 102.  Reviewed all lab results with Dr. Irene Limbo; and he confirmed that patient could proceed today with his chemotherapy.  We will hold the bolus 5-FU; but will continue with the planned 5-FU pump today.  Patient will return on 02/09/2016 to have his pump discontinued.  Patient will need to be scheduled for labs, visit, flush, and chemotherapy on 02/21/2016.  He will need to be scheduled for a pump discontinuation on 02/23/2016.

## 2016-02-07 NOTE — Patient Instructions (Signed)

## 2016-02-07 NOTE — Assessment & Plan Note (Signed)
Patient has a history of atrial fibrillation; and continues to take Xarelto as directed.

## 2016-02-07 NOTE — Assessment & Plan Note (Signed)
Patient typically takes metformin every morning.  As directed.  However, patient states that he forgot to take the metformin this morning.  Patient's blood sugar was 292.  Patient will be given 10 units insulin subcutaneously while at the cancer.  He stated that he would take the metformin as planned.  Once he returns back home today.

## 2016-02-07 NOTE — Progress Notes (Signed)
Established patient visit  HPI:  Ernest Lara 75 y.o. male diagnosed with colon cancer.  Status post colectomy in June 2017.  Currently undergoing FOLFOX chemotherapy regimen.  Patient presented to the [facility] today to receive cycle 3 of his chemotherapy regimen.    No history exists.    Review of Systems  Constitutional: Positive for malaise/fatigue.  All other systems reviewed and are negative.   Past Medical History:  Diagnosis Date  . CKD (chronic kidney disease) stage 3, GFR 30-59 ml/min   . Complete heart block ([place])    s. 05/02/2015 s/p BSX U128 [place] CRT-P Beckie Salts).  Marland Kitchen GIB (gastrointestinal bleeding)    a. 10/2015- Hgb 4.6-->8u PRBC's;  b. 10/2015 EGD: non bleeding H pylori + ulceration; c. 10/2015 Colonoscopy: invasive adenocarcinoma.  . History of cardiomyopathy    a. Nonischemic, possibly tachycardia mediated;  b. 04/2015 Echo:  EF 60-65%, no rwma, mild AI/MR, mod dil LA/RA, PASP 4mHg.  .Marland KitchenHypertension   . Hypertensive heart disease   . OSA (obstructive sleep apnea), pt with apnea during procedures 10/11/2011   Denies  . Paroxysmal atrial fibrillation (HCC)    a. CHA2DS2VASc = 2-3 (Xarelto).  . Stage IIIB Colon Cancer    a. 11/2015 Colonscopy: invasive adenocarcinoma;  b. 11/2015 s/p lap L colectomy; c. Pathology: pT3, pN1c Mx, MSI stable.  . Type 2 diabetes mellitus ([place]     Past Surgical History:  Procedure Laterality Date  . CARDIAC CATHETERIZATION    . CARDIOVERSION  10/11/2011   Procedure: CARDIOVERSION;  Surgeon: DLeonie Man MD;  [facility]  Service: Cardiovascular;  Laterality: N/A;  . COLONOSCOPY N/A 11/05/2015   Procedure: COLONOSCOPY;  Surgeon: RRonald Lobo MD;  [facility];  Service: Endoscopy;  Laterality: N/A;  . EP IMPLANTABLE DEVICE N/A 05/02/2015   Procedure: BiV Pacemaker Insertion CRT-P;  Surgeon: GEvans Lance MD;  [facility];  Service: Cardiovascular;  Laterality: N/A;  .  ESOPHAGOGASTRODUODENOSCOPY N/A 11/05/2015   Procedure: ESOPHAGOGASTRODUODENOSCOPY (EGD);  Surgeon: RRonald Lobo MD;  [facility];  Service: Endoscopy;  Laterality: N/A;  . EYE SURGERY  2015   left cataract with lens implant  . LAPAROSCOPIC PARTIAL COLECTOMY N/A 11/10/2015   Procedure: LAPAROSCOPIC LEFT COLECTOMY LAPAROSCOPIC MOBILIZATION OF SPLENIC FLEXURE;  Surgeon: EGreer Pickerel MD;  [facility]  Service: General;  Laterality: N/A;  . Nuclear stress  11/01/2011   Severe global hypokinesis,dilated ventricle  . PORTACATH PLACEMENT N/A 12/29/2015   Procedure: INSERTION PORT-A-CATH WITH ULTRASOUND GUIDANCE;  Surgeon: EGreer Pickerel MD;  Location: [facility];  Service: General;  Laterality: N/A;  . TEE WITHOUT CARDIOVERSION  10/10/2011   Procedure: TRANSESOPHAGEAL ECHOCARDIOGRAM (TEE);  Surgeon: MSanda Klein MD;  [facility];  Service: Cardiovascular;  Laterality: N/A;    has Shortness of breath, acute, awakened from sleep, 10/12/11, secondary to pulmonary toxicity to amiodarone; Atrial fibrillation with RVR, new onset, 10/08/11, DCCV to SR on 10/11/11; DM (diabetes mellitus), type 2 ; HTN (hypertension); Acute systolic CHF (congestive heart failure), new onset, could be related to tachycardia; Cardiomyopathy, poss. related to tachycardia , new, on echo 10/08/11 EF 20-25%; OSA (obstructive sleep apnea), pt with apnea during procedures; Amiodarone pulmonary toxicity, and associated pulmonary edema; CKD (chronic kidney disease) stage 3, GFR 30-59 ml/min; PAF (paroxysmal atrial fibrillation), recent DCCV 10/11/11 to SR.; Congestive dilated cardiomyopathy ([place]; Chronic combined systolic and diastolic CHF, NYHA class 1 ([place]; Hyperlipidemia; LBBB (left bundle branch block); Complete heart block (  [place]); Anemia; Iron deficiency anemia; Chronic blood loss anemia; Chronic diastolic heart failure ([place]); Absolute anemia; Chronic diastolic CHF (congestive heart failure) ([place]); Uncontrolled type 2 diabetes  mellitus with complication ([place]); Stage IIIB Colon Cancer; GIB (gastrointestinal bleeding); History of cardiomyopathy; Paroxysmal atrial fibrillation ([place]); B12 deficiency; Port catheter in place; Chemotherapy induced neutropenia ([place]); Long term current use of anticoagulant therapy; and Renal insufficiency on his problem list.    is allergic to amiodarone and allopurinol.    Medication List       Accurate as of 02/07/16 11:59 AM. Always use your most recent med list.          b complex vitamins capsule Take 1 capsule by mouth daily.   benazepril 40 MG tablet Commonly known as:  LOTENSIN TAKE 1 TABLET (40 MG TOTAL) BY MOUTH DAILY.   carvedilol 25 MG tablet Commonly known as:  COREG TAKE A HALF TABLET (12.5 MG) EVERY MORNING AND A WHOLE TABLET (25 MG) EVERY EVENING   dexamethasone 4 MG tablet Commonly known as:  DECADRON Take 2 tablets (8 mg total) by mouth daily. Start the day after chemotherapy for 2 days. Take with food.   doxazosin 8 MG tablet Commonly known as:  CARDURA TAKE 1 TABLET (8 MG TOTAL) BY MOUTH AT BEDTIME.   ferrous sulfate 325 (65 FE) MG EC tablet Take 1 tablet (325 mg total) by mouth daily with breakfast.   furosemide 80 MG tablet Commonly known as:  LASIX TAKE 1 TABLET (80 MG TOTAL) BY MOUTH DAILY.   lidocaine-prilocaine cream Commonly known as:  EMLA Apply to affected area once   metFORMIN 750 MG 24 hr tablet Commonly known as:  GLUCOPHAGE-XR Take 1 tablet by mouth daily.   ondansetron 8 MG tablet Commonly known as:  ZOFRAN Take 1 tablet (8 mg total) by mouth 2 (two) times daily as needed for refractory nausea / vomiting. Start on day 3 after chemotherapy.   potassium chloride 10 MEQ tablet Commonly known as:  KLOR-CON M10 Take 2 tablets (20 mEq total) by mouth daily.   prochlorperazine 10 MG tablet Commonly known as:  COMPAZINE Take 1 tablet (10 mg total) by mouth every 6 (six) hours as needed (Nausea or vomiting).   rosuvastatin 20 MG  tablet Commonly known as:  CRESTOR TAKE 1 TABLET (20 MG TOTAL) BY MOUTH AT BEDTIME.   sitaGLIPtin 100 MG tablet Commonly known as:  JANUVIA Take 100 mg by mouth daily.   vitamin C 100 MG tablet Take 100 mg by mouth daily.   XARELTO 20 MG Tabs tablet Generic drug:  rivaroxaban TAKE 1 TABLET BY MOUTH DAILY        PHYSICAL EXAMINATION  Oncology Vitals 02/07/2016 01/26/2016  Height 191 cm -  Weight 109.453 kg 107.843 kg  Weight (lbs) 241 lbs 5 oz 237 lbs 12 oz  BMI (kg/m2) 30.16 kg/m2 29.72 kg/m2  Temp 97.7 98.5  Pulse 67 78  Resp 18 18  SpO2 100 100  BSA (m2) 2.41 m2 2.39 m2   BP Readings from Last 2 Encounters:  02/07/16 127/62  01/26/16 (!) 150/67    Physical Exam  Constitutional: He is oriented to person, place, and time and well-developed, well-nourished, and in no distress.  HENT:  Head: Normocephalic and atraumatic.  Mouth/Throat: Oropharynx is clear and moist.  Eyes: Conjunctivae and EOM are normal. Pupils are equal, round, and reactive to light. Right eye exhibits no discharge. Left eye exhibits no discharge. No scleral icterus.  Neck: Normal range of  motion. Neck supple.  Pulmonary/Chest: Effort normal. No respiratory distress.  Musculoskeletal: Normal range of motion. He exhibits no edema or tenderness.  Neurological: He is alert and oriented to person, place, and time. Gait normal.  Skin: Skin is warm and dry.  Left abd incision almost completely healed. Small scab at site only. No infection noted.   Psychiatric: Affect normal.  Nursing note and vitals reviewed.   LABORATORY DATA:. Appointment on 02/07/2016  Component Date Value Ref Range Status  . WBC 02/07/2016 2.3* 4.0 - 10.3 10e3/uL Final  . NEUT# 02/07/2016 1.0* 1.5 - 6.5 10e3/uL Final  . HGB 02/07/2016 10.8* 13.0 - 17.1 g/dL Final  . HCT 02/07/2016 31.8* 38.4 - 49.9 % Final  . Platelets 02/07/2016 102* 140 - 400 10e3/uL Final  . MCV 02/07/2016 75.4* 79.3 - 98.0 fL Final  . MCH 02/07/2016 25.6*  27.2 - 33.4 pg Final  . MCHC 02/07/2016 34.0  32.0 - 36.0 g/dL Final  . RBC 02/07/2016 4.22  4.20 - 5.82 10e6/uL Final  . RDW 02/07/2016 17.9* 11.0 - 14.6 % Final  . lymph# 02/07/2016 0.9  0.9 - 3.3 10e3/uL Final  . MONO# 02/07/2016 0.3  0.1 - 0.9 10e3/uL Final  . Eosinophils Absolute 02/07/2016 0.0  0.0 - 0.5 10e3/uL Final  . Basophils Absolute 02/07/2016 0.0  0.0 - 0.1 10e3/uL Final  . NEUT% 02/07/2016 44.5  39.0 - 75.0 % Final  . LYMPH% 02/07/2016 40.4  14.0 - 49.0 % Final  . MONO% 02/07/2016 13.3  0.0 - 14.0 % Final  . EOS% 02/07/2016 0.9  0.0 - 7.0 % Final  . BASO% 02/07/2016 0.9  0.0 - 2.0 % Final  . Retic % 02/07/2016 1.82* 0.80 - 1.80 % Final  . Retic Ct Abs 02/07/2016 76.80  34.80 - 93.90 10e3/uL Final  . Immature Retic Fract 02/07/2016 16.30* 3.00 - 10.60 % Final  . Sodium 02/07/2016 137  136 - 145 mEq/L Final  . Potassium 02/07/2016 4.1  3.5 - 5.1 mEq/L Final  . Chloride 02/07/2016 101  98 - 109 mEq/L Final  . CO2 02/07/2016 27  22 - 29 mEq/L Final  . Glucose 02/07/2016 292* 70 - 140 mg/dl Final  . BUN 02/07/2016 19.0  7.0 - 26.0 mg/dL Final  . Creatinine 02/07/2016 1.8* 0.7 - 1.3 mg/dL Final  . Total Bilirubin 02/07/2016 0.89  0.20 - 1.20 mg/dL Final  . Alkaline Phosphatase 02/07/2016 82  40 - 150 U/L Final  . AST 02/07/2016 19  5 - 34 U/L Final  . ALT 02/07/2016 17  0 - 55 U/L Final  . Total Protein 02/07/2016 6.7  6.4 - 8.3 g/dL Final  . Albumin 02/07/2016 3.0* 3.5 - 5.0 g/dL Final  . Calcium 02/07/2016 10.4  8.4 - 10.4 mg/dL Final  . Anion Gap 02/07/2016 10  3 - 11 mEq/L Final  . EGFR 02/07/2016 41* >90 ml/min/1.73 m2 Final    RADIOGRAPHIC STUDIES: No results found.  ASSESSMENT/PLAN:    Stage IIIB Colon Cancer Patient presented to the [facility] today to receive cycle 3 of his FOLFOX chemotherapy regimen.  He continues to tolerate her chemotherapy fairly well; his only complaint being a metallic taste in his mouth.  He denies any recent fevers or  chills.  Blood counts obtained today reveal a WBC of 2.3, ANC 1.0, medical and 10.8, platelet count 102.  Reviewed all lab results with Dr. Irene Limbo; and he confirmed that patient could proceed today with his chemotherapy.  We will hold  the bolus 5-FU; but will continue with the planned 5-FU pump today.  Patient will return on 02/09/2016 to have his pump discontinued.  Patient will need to be scheduled for labs, visit, flush, and chemotherapy on 02/21/2016.  He will need to be scheduled for a pump discontinuation on 02/23/2016.  Renal insufficiency Patient has a history of chronic renal insufficiency.  Creatinine has increased from 1.5 up to 1.8.  Will continue to monitor closely.  Long term current use of anticoagulant therapy Patient has a history of atrial fibrillation; and continues to take Xarelto as directed.  DM (diabetes mellitus), type 2  Patient typically takes metformin every morning.  As directed.  However, patient states that he forgot to take the metformin this morning.  Patient's blood sugar was 292.  Patient will be given 10 units insulin subcutaneously while at the cancer.  He stated that he would take the metformin as planned.  Once he returns back home today.  Chemotherapy induced neutropenia ([employer]) Patient received cycle 2 of her FOLFOX chemotherapy on 01/24/2016.  Today's labs revealed CBC.  2.3 and ANC down to 1.0.  Patient remains afebrile.  Reviewed all findings with Dr. Irene Limbo; and he recommended holding the 5-FU bolus; but proceeding with chemotherapy otherwise.   We'll continue to monitor closely.   Patient stated understanding of all instructions; and was in agreement with this plan of care. The patient knows to call the clinic with any problems, questions or concerns.   Total time spent with patient was 25 minutes;  with greater than 75 percent of that time spent in face to face counseling regarding patient's symptoms,  and coordination of care and follow  up.  Disclaimer:This dictation was prepared with Dragon/digital dictation along with [employer]. Any transcriptional errors that result from this process are unintentional.  Drue Second, NP 02/07/2016

## 2016-02-07 NOTE — Progress Notes (Signed)
Ernest Bacon,NP came with pt to infusion room,  Per MD, hold 5FU push due to [place] low.

## 2016-02-07 NOTE — Patient Instructions (Signed)
[facility] Discharge Instructions for Patients Receiving Chemotherapy  Today you received the following chemotherapy agents: Oxaliplatin, Leucovorin, Adruicil.  To help prevent nausea and vomiting after your treatment, we encourage you to take your nausea medication as directed. If you develop nausea and vomiting that is not controlled by your nausea medication, call the clinic.   BELOW ARE SYMPTOMS THAT SHOULD BE REPORTED IMMEDIATELY:  *FEVER GREATER THAN 100.5 F  *CHILLS WITH OR WITHOUT FEVER  NAUSEA AND VOMITING THAT IS NOT CONTROLLED WITH YOUR NAUSEA MEDICATION  *UNUSUAL SHORTNESS OF BREATH  *UNUSUAL BRUISING OR BLEEDING  TENDERNESS IN MOUTH AND THROAT WITH OR WITHOUT PRESENCE OF ULCERS  *URINARY PROBLEMS  *BOWEL PROBLEMS  UNUSUAL RASH Items with * indicate a potential emergency and should be followed up as soon as possible.  Feel free to call the clinic you have any questions or concerns. The clinic phone number is [phone].  Please show the [facility] at check-in to the Emergency Department and triage nurse.

## 2016-02-07 NOTE — Assessment & Plan Note (Signed)
Patient has a history of chronic renal insufficiency.  Creatinine has increased from 1.5 up to 1.8.  Will continue to monitor closely.

## 2016-02-09 ENCOUNTER — Ambulatory Visit (HOSPITAL_BASED_OUTPATIENT_CLINIC_OR_DEPARTMENT_OTHER): Payer: Medicare Other

## 2016-02-09 DIAGNOSIS — C184 Malignant neoplasm of transverse colon: Secondary | ICD-10-CM | POA: Diagnosis not present

## 2016-02-09 DIAGNOSIS — Z452 Encounter for adjustment and management of vascular access device: Secondary | ICD-10-CM | POA: Diagnosis not present

## 2016-02-09 MED ORDER — SODIUM CHLORIDE 0.9% FLUSH
10.0000 mL | INTRAVENOUS | Status: DC | PRN
  Administered 2016-02-09: 10 mL
  Filled 2016-02-09: qty 10

## 2016-02-09 MED ORDER — HEPARIN SOD (PORK) LOCK FLUSH 100 UNIT/ML IV SOLN
500.0000 [IU] | Freq: Once | INTRAVENOUS | Status: AC | PRN
  Administered 2016-02-09: 500 [IU]
  Filled 2016-02-09: qty 5

## 2016-02-09 NOTE — Patient Instructions (Signed)

## 2016-02-21 ENCOUNTER — Telehealth: Payer: Self-pay | Admitting: Hematology

## 2016-02-21 NOTE — Telephone Encounter (Signed)
Called patient to confirm appointments per 02/07/16 los. L/M Appt lltr and schd mailed. 02/21/16

## 2016-02-22 ENCOUNTER — Telehealth: Payer: Self-pay | Admitting: Hematology

## 2016-02-22 ENCOUNTER — Other Ambulatory Visit (HOSPITAL_BASED_OUTPATIENT_CLINIC_OR_DEPARTMENT_OTHER): Payer: Medicare Other

## 2016-02-22 ENCOUNTER — Ambulatory Visit (HOSPITAL_BASED_OUTPATIENT_CLINIC_OR_DEPARTMENT_OTHER): Payer: Medicare Other | Admitting: Hematology

## 2016-02-22 ENCOUNTER — Ambulatory Visit (HOSPITAL_BASED_OUTPATIENT_CLINIC_OR_DEPARTMENT_OTHER): Payer: Medicare Other

## 2016-02-22 VITALS — BP 130/59 | HR 82 | Temp 97.8°F | Resp 18 | Wt 235.6 lb

## 2016-02-22 DIAGNOSIS — D696 Thrombocytopenia, unspecified: Secondary | ICD-10-CM

## 2016-02-22 DIAGNOSIS — Z9189 Other specified personal risk factors, not elsewhere classified: Secondary | ICD-10-CM

## 2016-02-22 DIAGNOSIS — C189 Malignant neoplasm of colon, unspecified: Secondary | ICD-10-CM

## 2016-02-22 DIAGNOSIS — Z5111 Encounter for antineoplastic chemotherapy: Secondary | ICD-10-CM

## 2016-02-22 DIAGNOSIS — C184 Malignant neoplasm of transverse colon: Secondary | ICD-10-CM

## 2016-02-22 DIAGNOSIS — N183 Chronic kidney disease, stage 3 (moderate): Secondary | ICD-10-CM | POA: Diagnosis not present

## 2016-02-22 DIAGNOSIS — I10 Essential (primary) hypertension: Secondary | ICD-10-CM

## 2016-02-22 DIAGNOSIS — D509 Iron deficiency anemia, unspecified: Secondary | ICD-10-CM

## 2016-02-22 DIAGNOSIS — Z452 Encounter for adjustment and management of vascular access device: Secondary | ICD-10-CM

## 2016-02-22 DIAGNOSIS — E119 Type 2 diabetes mellitus without complications: Secondary | ICD-10-CM

## 2016-02-22 DIAGNOSIS — Z95828 Presence of other vascular implants and grafts: Secondary | ICD-10-CM

## 2016-02-22 DIAGNOSIS — I5032 Chronic diastolic (congestive) heart failure: Secondary | ICD-10-CM

## 2016-02-22 DIAGNOSIS — E118 Type 2 diabetes mellitus with unspecified complications: Secondary | ICD-10-CM

## 2016-02-22 DIAGNOSIS — N289 Disorder of kidney and ureter, unspecified: Secondary | ICD-10-CM

## 2016-02-22 LAB — CBC & DIFF AND RETIC
BASO%: 0.6 % (ref 0.0–2.0)
Basophils Absolute: 0 10*3/uL (ref 0.0–0.1)
EOS ABS: 0 10*3/uL (ref 0.0–0.5)
EOS%: 1 % (ref 0.0–7.0)
HCT: 31 % — ABNORMAL LOW (ref 38.4–49.9)
HEMOGLOBIN: 10.4 g/dL — AB (ref 13.0–17.1)
IMMATURE RETIC FRACT: 13.8 % — AB (ref 3.00–10.60)
LYMPH#: 1.1 10*3/uL (ref 0.9–3.3)
LYMPH%: 35.4 % (ref 14.0–49.0)
MCH: 25.9 pg — ABNORMAL LOW (ref 27.2–33.4)
MCHC: 33.5 g/dL (ref 32.0–36.0)
MCV: 77.3 fL — AB (ref 79.3–98.0)
MONO#: 0.4 10*3/uL (ref 0.1–0.9)
MONO%: 12.5 % (ref 0.0–14.0)
NEUT%: 50.5 % (ref 39.0–75.0)
NEUTROS ABS: 1.6 10*3/uL (ref 1.5–6.5)
Platelets: 97 10*3/uL — ABNORMAL LOW (ref 140–400)
RBC: 4.01 10*6/uL — AB (ref 4.20–5.82)
RDW: 17.7 % — ABNORMAL HIGH (ref 11.0–14.6)
RETIC CT ABS: 91.43 10*3/uL (ref 34.80–93.90)
Retic %: 2.28 % — ABNORMAL HIGH (ref 0.80–1.80)
WBC: 3.1 10*3/uL — AB (ref 4.0–10.3)

## 2016-02-22 LAB — COMPREHENSIVE METABOLIC PANEL
ALBUMIN: 3 g/dL — AB (ref 3.5–5.0)
ALK PHOS: 88 U/L (ref 40–150)
ALT: 21 U/L (ref 0–55)
AST: 21 U/L (ref 5–34)
Anion Gap: 10 mEq/L (ref 3–11)
BILIRUBIN TOTAL: 0.94 mg/dL (ref 0.20–1.20)
BUN: 20.7 mg/dL (ref 7.0–26.0)
CO2: 26 meq/L (ref 22–29)
CREATININE: 2.3 mg/dL — AB (ref 0.7–1.3)
Calcium: 9.9 mg/dL (ref 8.4–10.4)
Chloride: 102 mEq/L (ref 98–109)
EGFR: 31 mL/min/{1.73_m2} — AB (ref >90)
GLUCOSE: 332 mg/dL — AB (ref 70–140)
Potassium: 4.1 mEq/L (ref 3.5–5.1)
SODIUM: 138 meq/L (ref 136–145)
Total Protein: 6.7 g/dL (ref 6.4–8.3)

## 2016-02-22 MED ORDER — PALONOSETRON HCL INJECTION 0.25 MG/5ML
0.2500 mg | Freq: Once | INTRAVENOUS | Status: AC
  Administered 2016-02-22: 0.25 mg via INTRAVENOUS

## 2016-02-22 MED ORDER — LEUCOVORIN CALCIUM INJECTION 350 MG
400.0000 mg/m2 | Freq: Once | INTRAVENOUS | Status: AC
  Administered 2016-02-22: 972 mg via INTRAVENOUS
  Filled 2016-02-22: qty 48.6

## 2016-02-22 MED ORDER — DEXTROSE 5 % IV SOLN
Freq: Once | INTRAVENOUS | Status: AC
  Administered 2016-02-22: 13:00:00 via INTRAVENOUS

## 2016-02-22 MED ORDER — SODIUM CHLORIDE 0.9 % IV SOLN
2400.0000 mg/m2 | INTRAVENOUS | Status: DC
  Administered 2016-02-22: 5850 mg via INTRAVENOUS
  Filled 2016-02-22: qty 117

## 2016-02-22 MED ORDER — SODIUM CHLORIDE 0.9 % IJ SOLN
10.0000 mL | INTRAMUSCULAR | Status: DC | PRN
  Administered 2016-02-22: 10 mL via INTRAVENOUS
  Filled 2016-02-22: qty 10

## 2016-02-22 MED ORDER — CHLORHEXIDINE GLUCONATE 0.12 % MT SOLN: 15.0000 mL | Freq: Two times a day (BID) | OROMUCOSAL | 0 refills | Status: DC

## 2016-02-22 MED ORDER — ALTEPLASE 2 MG IJ SOLR
2.0000 mg | Freq: Once | INTRAMUSCULAR | Status: AC | PRN
  Administered 2016-02-22: 2 mg
  Filled 2016-02-22: qty 2

## 2016-02-22 MED ORDER — DEXTROSE 5 % IV SOLN
65.0000 mg/m2 | Freq: Once | INTRAVENOUS | Status: AC
  Administered 2016-02-22: 160 mg via INTRAVENOUS
  Filled 2016-02-22: qty 20

## 2016-02-22 MED ORDER — PALONOSETRON HCL INJECTION 0.25 MG/5ML
INTRAVENOUS | Status: AC
  Filled 2016-02-22: qty 5

## 2016-02-22 MED ORDER — SODIUM CHLORIDE 0.9 % IV SOLN
10.0000 mg | Freq: Once | INTRAVENOUS | Status: AC
  Administered 2016-02-22: 10 mg via INTRAVENOUS
  Filled 2016-02-22: qty 1

## 2016-02-22 MED ORDER — FLUOROURACIL CHEMO INJECTION 2.5 GM/50ML
400.0000 mg/m2 | Freq: Once | INTRAVENOUS | Status: AC
  Administered 2016-02-22: 950 mg via INTRAVENOUS
  Filled 2016-02-22: qty 19

## 2016-02-22 MED ORDER — SODIUM CHLORIDE 0.9 % IV SOLN
Freq: Once | INTRAVENOUS | Status: AC
  Administered 2016-02-22: 13:00:00 via INTRAVENOUS

## 2016-02-22 NOTE — Patient Instructions (Signed)
-  We are reducing what other chemotherapy doses on Oxqaloplatin a little today to improve tolerance. -peridex mouthwash -Oral mouthwash (at home) 500 mL of room temperature water with 1 teaspoon of salt and 1 teaspoon of baking soda - swish and spit 4 times a day.  -Follow-up closely with your primary care physician to optimize your diabetes control. Your blood sugars appear to be running in the 200s and 300s and controlling these would be important to avoid fatigue, dehydration and excessive stress to your kidney.

## 2016-02-22 NOTE — Progress Notes (Signed)
Ok to treat Per Dr. Irene Limbo with elevated SCr and low platelets.  Joni, RN in infusion notified.

## 2016-02-22 NOTE — Patient Instructions (Signed)

## 2016-02-22 NOTE — Patient Instructions (Signed)
[facility] Discharge Instructions for Patients Receiving Chemotherapy  Today you received the following chemotherapy agents: Oxaliplatin, Leucovorin, Adruicil.  To help prevent nausea and vomiting after your treatment, we encourage you to take your nausea medication as directed. If you develop nausea and vomiting that is not controlled by your nausea medication, call the clinic.   BELOW ARE SYMPTOMS THAT SHOULD BE REPORTED IMMEDIATELY:  *FEVER GREATER THAN 100.5 F  *CHILLS WITH OR WITHOUT FEVER  NAUSEA AND VOMITING THAT IS NOT CONTROLLED WITH YOUR NAUSEA MEDICATION  *UNUSUAL SHORTNESS OF BREATH  *UNUSUAL BRUISING OR BLEEDING  TENDERNESS IN MOUTH AND THROAT WITH OR WITHOUT PRESENCE OF ULCERS  *URINARY PROBLEMS  *BOWEL PROBLEMS  UNUSUAL RASH Items with * indicate a potential emergency and should be followed up as soon as possible.  Feel free to call the clinic you have any questions or concerns. The clinic phone number is [phone].  Please show the [facility] at check-in to the Emergency Department and triage nurse.

## 2016-02-22 NOTE — Telephone Encounter (Signed)
Gave patient son avs report and appointments for September and October. Per son appointments scheduled Wednesday/Friday. Patient will see GK 9/26.

## 2016-02-24 ENCOUNTER — Ambulatory Visit (HOSPITAL_BASED_OUTPATIENT_CLINIC_OR_DEPARTMENT_OTHER): Payer: Medicare Other

## 2016-02-24 VITALS — BP 151/61 | HR 69 | Temp 98.4°F | Resp 18

## 2016-02-24 DIAGNOSIS — C184 Malignant neoplasm of transverse colon: Secondary | ICD-10-CM

## 2016-02-24 DIAGNOSIS — C189 Malignant neoplasm of colon, unspecified: Secondary | ICD-10-CM | POA: Diagnosis not present

## 2016-02-24 DIAGNOSIS — Z452 Encounter for adjustment and management of vascular access device: Secondary | ICD-10-CM | POA: Diagnosis not present

## 2016-02-24 MED ORDER — HEPARIN SOD (PORK) LOCK FLUSH 100 UNIT/ML IV SOLN
500.0000 [IU] | Freq: Once | INTRAVENOUS | Status: AC | PRN
  Administered 2016-02-24: 500 [IU]
  Filled 2016-02-24: qty 5

## 2016-02-24 MED ORDER — SODIUM CHLORIDE 0.9% FLUSH
10.0000 mL | INTRAVENOUS | Status: DC | PRN
Start: 2016-02-24 — End: 2016-02-24
  Administered 2016-02-24: 10 mL
  Filled 2016-02-24: qty 10

## 2016-02-24 NOTE — Patient Instructions (Signed)

## 2016-02-25 NOTE — Progress Notes (Signed)
Marland Kitchen    HEMATOLOGY/ONCOLOGY CONSULTATION NOTE  Date of Service:02/22/2016  Patient Care Team: [name], MD as PCP - General (Endocrinology)  CHIEF COMPLAINTS - follow-up for chemotherapy for colon cancer  Diagnosis: Stage IIIB Colon Cancer diagnosed 11/10/2015  Current treatment  Adjuvant FOLFOX status post 3 cycles. Bolus 5-FU was held for cycle 3 due to neutropenia  Previous treatment -  left hemicolectomy 11/10/2015 - Port-A-Cath placement 12/29/2015  INTERVAL HISTORY  Patient is here for his scheduled follow-up prior to his fourth cycle of FOLFOX chemotherapy . He was seen by Retta Mac NP during his last visit and bolus 5-FU was held due to mild neutropenia. ANC is up to 1.6 today. Patient notes grade 1 fatigue but is otherwise functioning okay. Abdominal surgical wound has completely healed with no open areas.  Mild mild soreness with small ulcer on the roof of the mouth likely due to chemotherapy. No fevers/chills/night sweats. No nausea or vomiting. No Muscle pains no chest pain no shortness of breath Somewhat decreased appetite but feels that he can continue to eat okay.  MEDICAL HISTORY:  Past Medical History:  Diagnosis Date  . CKD (chronic kidney disease) stage 3, GFR 30-59 ml/min   . Complete heart block ([place])    s. 05/02/2015 s/p BSX U128 [place] CRT-P Beckie Salts).  Marland Kitchen GIB (gastrointestinal bleeding)    a. 10/2015- Hgb 4.6-->8u PRBC's;  b. 10/2015 EGD: non bleeding H pylori + ulceration; c. 10/2015 Colonoscopy: invasive adenocarcinoma.  . History of cardiomyopathy    a. Nonischemic, possibly tachycardia mediated;  b. 04/2015 Echo:  EF 60-65%, no rwma, mild AI/MR, mod dil LA/RA, PASP 12mHg.  .Marland KitchenHypertension   . Hypertensive heart disease   . OSA (obstructive sleep apnea), pt with apnea during procedures 10/11/2011   Denies  . Paroxysmal atrial fibrillation (HCC)    a. CHA2DS2VASc = 2-3 (Xarelto).  . Stage IIIB Colon Cancer    a. 11/2015 Colonscopy: invasive  adenocarcinoma;  b. 11/2015 s/p lap L colectomy; c. Pathology: pT3, pN1c Mx, MSI stable.  . [place] 2 [place]     SURGICAL HISTORY: Past Surgical History:  Procedure Laterality Date  . CARDIAC CATHETERIZATION    . CARDIOVERSION  10/11/2011   Procedure: CARDIOVERSION;  Surgeon: DLeonie Man MD;  [place] [facility]  Service: Cardiovascular;  Laterality: N/A;  . COLONOSCOPY N/A 11/05/2015   Procedure: COLONOSCOPY;  Surgeon: RRonald Lobo MD;  [facility];  Service: Endoscopy;  Laterality: N/A;  . EP IMPLANTABLE DEVICE N/A 05/02/2015   Procedure: BiV Pacemaker Insertion CRT-P;  Surgeon: GEvans Lance MD;  [facility];  Service: Cardiovascular;  Laterality: N/A;  . ESOPHAGOGASTRODUODENOSCOPY N/A 11/05/2015   Procedure: ESOPHAGOGASTRODUODENOSCOPY (EGD);  Surgeon: RRonald Lobo MD;  [facility];  Service: Endoscopy;  Laterality: N/A;  . EYE SURGERY  2015   left cataract with lens implant  . LAPAROSCOPIC PARTIAL COLECTOMY N/A 11/10/2015   Procedure: LAPAROSCOPIC LEFT COLECTOMY LAPAROSCOPIC MOBILIZATION OF SPLENIC FLEXURE;  Surgeon: EGreer Pickerel MD;  [place] [facility]  Service: General;  Laterality: N/A;  . Nuclear stress  11/01/2011   Severe global hypokinesis,dilated ventricle  . PORTACATH PLACEMENT N/A 12/29/2015   Procedure: INSERTION PORT-A-CATH WITH ULTRASOUND GUIDANCE;  Surgeon: EGreer Pickerel MD;  [facility];  Service: General;  Laterality: N/A;  . TEE WITHOUT CARDIOVERSION  10/10/2011   Procedure: TRANSESOPHAGEAL ECHOCARDIOGRAM (TEE);  Surgeon: MSanda Klein MD;  [place] [facility]  Service: Cardiovascular;  Laterality: N/A;  SOCIAL HISTORY: Social History   Social History  . Marital status: Married    Spouse name: N/A  . Number of children: N/A  . Years of education: N/A   Occupational History  . Not on file.   Social History Main Topics  . Smoking status: Never Smoker  . Smokeless tobacco: Never Used  . Alcohol  use No  . Drug use: No  . Sexual activity: Not Currently   Other Topics Concern  . Not on file   Social History Narrative  . No narrative on file    FAMILY HISTORY: Family History  Problem Relation Age of Onset  . Colon cancer Mother   . Kidney disease Father     ESRF/HD, deceased    ALLERGIES:  is allergic to amiodarone and allopurinol.  MEDICATIONS:  Current Outpatient Prescriptions  Medication Sig Dispense Refill  . Ascorbic Acid (VITAMIN C) 100 MG tablet Take 100 mg by mouth daily.    Marland Kitchen b complex vitamins capsule Take 1 capsule by mouth daily. 30 capsule 3  . benazepril (LOTENSIN) 40 MG tablet TAKE 1 TABLET (40 MG TOTAL) BY MOUTH DAILY. 30 tablet 6  . carvedilol (COREG) 25 MG tablet TAKE A HALF TABLET (12.5 MG) EVERY MORNING AND A WHOLE TABLET (25 MG) EVERY EVENING 135 tablet 0  . chlorhexidine (PERIDEX) 0.12 % solution Use as directed 15 mLs in the mouth or throat 2 (two) times daily. 473 mL 0  . dexamethasone (DECADRON) 4 MG tablet Take 2 tablets (8 mg total) by mouth daily. Start the day after chemotherapy for 2 days. Take with food. 30 tablet 1  . doxazosin (CARDURA) 8 MG tablet TAKE 1 TABLET (8 MG TOTAL) BY MOUTH AT BEDTIME. 30 tablet 9  . ferrous sulfate 325 (65 FE) MG EC tablet Take 1 tablet (325 mg total) by mouth daily with breakfast. 30 tablet 0  . furosemide (LASIX) 80 MG tablet TAKE 1 TABLET (80 MG TOTAL) BY MOUTH DAILY. 60 tablet 1  . lidocaine-prilocaine (EMLA) cream Apply to affected area once 30 g 3  . metFORMIN (GLUCOPHAGE-XR) 750 MG 24 hr tablet Take 1 tablet by mouth daily.    . ondansetron (ZOFRAN) 8 MG tablet Take 1 tablet (8 mg total) by mouth 2 (two) times daily as needed for refractory nausea / vomiting. Start on day 3 after chemotherapy. 30 tablet 1  . potassium chloride (KLOR-CON M10) 10 MEQ tablet Take 2 tablets (20 mEq total) by mouth daily. 60 tablet 6  . prochlorperazine (COMPAZINE) 10 MG tablet Take 1 tablet (10 mg total) by mouth every 6 (six)  hours as needed (Nausea or vomiting). 30 tablet 1  . rosuvastatin (CRESTOR) 20 MG tablet TAKE 1 TABLET (20 MG TOTAL) BY MOUTH AT BEDTIME. 30 tablet 10  . sitaGLIPtin (JANUVIA) 100 MG tablet Take 100 mg by mouth daily.    Alveda Reasons 20 MG TABS tablet TAKE 1 TABLET BY MOUTH DAILY 30 tablet 5   No current facility-administered medications for this visit.     REVIEW OF SYSTEMS:    10 Point review of Systems was done is negative except as noted above.  PHYSICAL EXAMINATION: ECOG PERFORMANCE STATUS: 2 - Symptomatic, <50% confined to bed  . Vitals:   02/22/16 1159  BP: (!) 130/59  Pulse: 82  Resp: 18  Temp: 97.8 F (36.6 C)   Filed Weights   02/22/16 1159  Weight: 235 lb 9.6 oz (106.9 kg)   .Body mass index is 29.45 kg/m.   Marland Kitchen  Wt Readings from Last 3 Encounters:  02/22/16 235 lb 9.6 oz (106.9 kg)  02/07/16 241 lb 4.8 oz (109.5 kg)  01/26/16 237 lb 12 oz (107.8 kg)   GENERAL:alert, in no acute distress and comfortable SKIN: skin color, texture, turgor are normal, no rashes or significant lesions EYES: normal, conjunctiva are pink and non-injected, sclera clear OROPHARYNX:no exudate, no erythema and lips, buccal mucosa, and tongue normal  NECK: supple, no JVD, thyroid normal size, non-tender, without nodularity LYMPH:  no palpable lymphadenopathy in the cervical, axillary or inguinal LUNGS: clear to auscultation with normal respiratory effort HEART: regular rate & rhythm,  no murmurs and no lower extremity edema ABDOMEN: Abdominal surgical incision has healed with minimal superficial crusting remaining . Musculoskeletal: no cyanosis of digits and no clubbing  PSYCH: alert & oriented x 3 with fluent speech NEURO: no focal motor/sensory deficits  LABORATORY DATA:  I have reviewed the data as listed  . CBC Latest Ref Rng & Units 02/22/2016 02/07/2016 01/24/2016  WBC 4.0 - 10.3 10e3/uL 3.1(L) 2.3(L) 4.3  Hemoglobin 13.0 - 17.1 g/dL 10.4(L) 10.8(L) 10.4(L)  Hematocrit 38.4 - 49.9  % 31.0(L) 31.8(L) 31.1(L)  Platelets 140 - 400 10e3/uL 97(L) 102(L) 112(L)   . CBC    Component Value Date/Time   WBC 3.1 (L) 02/22/2016 1118   WBC 5.8 12/25/2015 0840   RBC 4.01 (L) 02/22/2016 1118   RBC 4.40 12/25/2015 0840   HGB 10.4 (L) 02/22/2016 1118   HCT 31.0 (L) 02/22/2016 1118   PLT 97 (L) 02/22/2016 1118   MCV 77.3 (L) 02/22/2016 1118   MCH 25.9 (L) 02/22/2016 1118   MCH 23.9 (L) 12/25/2015 0840   MCHC 33.5 02/22/2016 1118   MCHC 32.1 12/25/2015 0840   RDW 17.7 (H) 02/22/2016 1118   LYMPHSABS 1.1 02/22/2016 1118   MONOABS 0.4 02/22/2016 1118   EOSABS 0.0 02/22/2016 1118   BASOSABS 0.0 02/22/2016 1118    . CMP Latest Ref Rng & Units 02/22/2016 02/07/2016 01/24/2016  Glucose 70 - 140 mg/dl 332(H) 292(H) 255(H)  BUN 7.0 - 26.0 mg/dL 20.7 19.0 19.4  Creatinine 0.7 - 1.3 mg/dL 2.3(H) 1.8(H) 1.5(H)  Sodium 136 - 145 mEq/L 138 137 139  Potassium 3.5 - 5.1 mEq/L 4.1 4.1 4.1  Chloride 101 - 111 mmol/L - - -  CO2 22 - 29 mEq/L 26 27 28   Calcium 8.4 - 10.4 mg/dL 9.9 10.4 9.8  Total Protein 6.4 - 8.3 g/dL 6.7 6.7 6.4  Total Bilirubin 0.20 - 1.20 mg/dL 0.94 0.89 0.76  Alkaline Phos 40 - 150 U/L 88 82 80  AST 5 - 34 U/L 21 19 18   ALT 0 - 55 U/L 21 17 16    Component     Latest Ref Rng 11/22/2015  Iron     42 - 163 ug/dL 24 (L)  TIBC     202 - 409 ug/dL 244  UIBC     117 - 376 ug/dL 219  %SAT     20 - 55 % 10 (L)  CEA     0.0 - 4.7 ng/mL 0.9  Ferritin     22 - 316 ng/ml 415 (H)  Vitamin B12     211 - 946 pg/mL 276      RADIOGRAPHIC STUDIES: I have personally reviewed the radiological images as listed and agreed with the findings in the report. No results found.  ASSESSMENT & PLAN:   75 yo caucasian male with   1) Stage IIIB (pT3, pN1c, Mx)  Colon Adenocarcinoma involving with transverse colon. Patient is s/p left sided colectomy on 11/10/2015 . Surgical incision Has completely healed up.  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 Patient received 8  units of PRBC and IV iron in the hospital unusual presentation. Hgb improving. B12 borderline low Post-operative CEA WNL 3) HTN - controlled.  4) DM2 - uncontrolled in the setting of chemotherapy and steroid use. 5) Non ischemic chronic diastolic CHF 6) P. a fib on Xarelto per cardiology 7) CKD 3 with some increase in the creatinine over baseline.  -Discontinued due to decreased fluid intake and his uncontrolled diabetes. Patient is also on ACE inhibitor as per primary care physician. -He was recommended to follow-up with his primary care physician closely to optimize control of his diabetes and consider decreasing benzapril. -We shall be giving him an additional 500 cc of normal saline in addition to his other fluids. 8) Duodenal ulceration (h pylori -positive) - completed Abx for rx. On PPI 9) thrombocytopenia likely related to chemotherapy PLAN --Patient's neutropenia has resolved since his last treatment.  --- Will continue FOLFOX but will reduce Oxaloplatin 65 mg/m due to bump in creatinine and borderline thrombocytopenia. --We shall continue bolus and infusional 5-FU and monitor his counts. If the patient is neutropenic again despite the lower dose of Oxaloplatin might be to drop the bolus 5-FU as well. -Patient needs close follow-up with his primary care physician to optimize his diabetes treatment due to uncontrolled hyperglycemia which is putting additional strain on his kidneys. --Also he is on multiple antihypertensives including Lasix and and ace inhibitors which puts him at high risk for fluctuations and renal function based on volume status and ACE inhibitor. This will need to be closely monitored with his primary care physician as well. -I have forwarded my note and send a message to his primary care physician and will have my nurse called his primary care physician's office as well. -continue po replacement of B12/Bcomplex and continue po iron. -continue to maintain ambulation  and good po intake -continue f/u with PCP for management of other medical problems.  Return to care in 2 week with Dr. Irene Limbo with labs prior to C5 of FOLFOX.  I spent 35 minutes counseling the patient face to face. The total time spent in the appointment was 40 minutes and more than 50% was on counseling and direct patient cares.    Sullivan Lone MD [place] AAHIVMS [facility] Hematology/Oncology Physician [facility]  (Office):       [phone] (Work cell):  [phone] (Fax):           [phone]

## 2016-02-26 ENCOUNTER — Telehealth: Payer: Self-pay | Admitting: *Deleted

## 2016-02-26 NOTE — Telephone Encounter (Signed)
Per Dr. Irene Limbo, left message with Joelene Millin at Dr. [name] office to verify Dr. Carlis Abbott has received the message Dr. Irene Limbo sent via [facility] regarding follow up on Mr. Roop's hyperglycemia, ace inhibitors/antihypertensives, and diuretics.  Per Joelene Millin, we would receive a call back if Dr. Carlis Abbott did not receive the message.

## 2016-03-05 ENCOUNTER — Ambulatory Visit (HOSPITAL_BASED_OUTPATIENT_CLINIC_OR_DEPARTMENT_OTHER): Payer: Medicare Other | Admitting: Hematology

## 2016-03-05 ENCOUNTER — Ambulatory Visit (HOSPITAL_BASED_OUTPATIENT_CLINIC_OR_DEPARTMENT_OTHER): Payer: Medicare Other

## 2016-03-05 ENCOUNTER — Encounter: Payer: Self-pay | Admitting: Hematology

## 2016-03-05 ENCOUNTER — Encounter: Payer: Medicare Other | Admitting: Cardiovascular Disease

## 2016-03-05 ENCOUNTER — Other Ambulatory Visit (HOSPITAL_BASED_OUTPATIENT_CLINIC_OR_DEPARTMENT_OTHER): Payer: Medicare Other

## 2016-03-05 VITALS — BP 130/67 | HR 80 | Temp 98.4°F | Resp 18 | Ht 75.0 in | Wt 230.6 lb

## 2016-03-05 DIAGNOSIS — T451X5A Adverse effect of antineoplastic and immunosuppressive drugs, initial encounter: Secondary | ICD-10-CM

## 2016-03-05 DIAGNOSIS — D509 Iron deficiency anemia, unspecified: Secondary | ICD-10-CM

## 2016-03-05 DIAGNOSIS — D6959 Other secondary thrombocytopenia: Secondary | ICD-10-CM

## 2016-03-05 DIAGNOSIS — N183 Chronic kidney disease, stage 3 (moderate): Secondary | ICD-10-CM

## 2016-03-05 DIAGNOSIS — C189 Malignant neoplasm of colon, unspecified: Secondary | ICD-10-CM

## 2016-03-05 DIAGNOSIS — Z9189 Other specified personal risk factors, not elsewhere classified: Secondary | ICD-10-CM

## 2016-03-05 DIAGNOSIS — D701 Agranulocytosis secondary to cancer chemotherapy: Secondary | ICD-10-CM

## 2016-03-05 DIAGNOSIS — Z95828 Presence of other vascular implants and grafts: Secondary | ICD-10-CM

## 2016-03-05 DIAGNOSIS — E118 Type 2 diabetes mellitus with unspecified complications: Secondary | ICD-10-CM | POA: Diagnosis not present

## 2016-03-05 DIAGNOSIS — D696 Thrombocytopenia, unspecified: Secondary | ICD-10-CM

## 2016-03-05 DIAGNOSIS — C184 Malignant neoplasm of transverse colon: Secondary | ICD-10-CM

## 2016-03-05 DIAGNOSIS — N189 Chronic kidney disease, unspecified: Secondary | ICD-10-CM | POA: Diagnosis not present

## 2016-03-05 DIAGNOSIS — Z452 Encounter for adjustment and management of vascular access device: Secondary | ICD-10-CM

## 2016-03-05 DIAGNOSIS — I4891 Unspecified atrial fibrillation: Secondary | ICD-10-CM

## 2016-03-05 LAB — CBC & DIFF AND RETIC
BASO%: 0.6 % (ref 0.0–2.0)
BASOS ABS: 0 10*3/uL (ref 0.0–0.1)
EOS%: 0.9 % (ref 0.0–7.0)
Eosinophils Absolute: 0 10*3/uL (ref 0.0–0.5)
HEMATOCRIT: 28.6 % — AB (ref 38.4–49.9)
HEMOGLOBIN: 9.7 g/dL — AB (ref 13.0–17.1)
Immature Retic Fract: 18.2 % — ABNORMAL HIGH (ref 3.00–10.60)
LYMPH#: 1.3 10*3/uL (ref 0.9–3.3)
LYMPH%: 37.4 % (ref 14.0–49.0)
MCH: 26.1 pg — ABNORMAL LOW (ref 27.2–33.4)
MCHC: 33.9 g/dL (ref 32.0–36.0)
MCV: 76.9 fL — ABNORMAL LOW (ref 79.3–98.0)
MONO#: 0.3 10*3/uL (ref 0.1–0.9)
MONO%: 9.5 % (ref 0.0–14.0)
NEUT#: 1.7 10*3/uL (ref 1.5–6.5)
NEUT%: 51.6 % (ref 39.0–75.0)
PLATELETS: 119 10*3/uL — AB (ref 140–400)
RBC: 3.72 10*6/uL — AB (ref 4.20–5.82)
RDW: 17.6 % — ABNORMAL HIGH (ref 11.0–14.6)
RETIC %: 2.65 % — AB (ref 0.80–1.80)
RETIC CT ABS: 98.58 10*3/uL — AB (ref 34.80–93.90)
WBC: 3.4 10*3/uL — ABNORMAL LOW (ref 4.0–10.3)
nRBC: 0 % (ref 0–0)

## 2016-03-05 LAB — COMPREHENSIVE METABOLIC PANEL
ALT: 15 U/L (ref 0–55)
AST: 18 U/L (ref 5–34)
Albumin: 3.1 g/dL — ABNORMAL LOW (ref 3.5–5.0)
Alkaline Phosphatase: 76 U/L (ref 40–150)
Anion Gap: 11 mEq/L (ref 3–11)
BILIRUBIN TOTAL: 1.05 mg/dL (ref 0.20–1.20)
BUN: 14.8 mg/dL (ref 7.0–26.0)
CO2: 25 meq/L (ref 22–29)
CREATININE: 1.8 mg/dL — AB (ref 0.7–1.3)
Calcium: 10.1 mg/dL (ref 8.4–10.4)
Chloride: 103 mEq/L (ref 98–109)
EGFR: 43 mL/min/{1.73_m2} — ABNORMAL LOW (ref >90)
GLUCOSE: 261 mg/dL — AB (ref 70–140)
Potassium: 4 mEq/L (ref 3.5–5.1)
SODIUM: 138 meq/L (ref 136–145)
TOTAL PROTEIN: 6.6 g/dL (ref 6.4–8.3)

## 2016-03-05 MED ORDER — HEPARIN SOD (PORK) LOCK FLUSH 100 UNIT/ML IV SOLN
500.0000 [IU] | Freq: Once | INTRAVENOUS | Status: AC | PRN
  Administered 2016-03-05: 500 [IU] via INTRAVENOUS
  Filled 2016-03-05: qty 5

## 2016-03-05 MED ORDER — GLIPIZIDE 5 MG PO TABS: 2.5000 mg | ORAL_TABLET | Freq: Every day | ORAL | 0 refills | Status: DC

## 2016-03-05 MED ORDER — SODIUM CHLORIDE 0.9 % IJ SOLN
10.0000 mL | INTRAMUSCULAR | Status: DC | PRN
  Administered 2016-03-05: 10 mL via INTRAVENOUS
  Filled 2016-03-05: qty 10

## 2016-03-05 NOTE — Progress Notes (Signed)
Pt asked to stay accessed in hopes to receive treatment today because of transportation reasons. Labs and dr visit was set for today but treatment for 9/27. Left pt accessed with disc in place.

## 2016-03-05 NOTE — Progress Notes (Signed)
OK to treat with creatinine of 1.8 per Dr. Irene Limbo.  Continue dose reduced oxaliplatin.  Continue 5FU bolus/pump.

## 2016-03-06 ENCOUNTER — Telehealth: Payer: Self-pay | Admitting: Hematology

## 2016-03-06 ENCOUNTER — Ambulatory Visit (HOSPITAL_BASED_OUTPATIENT_CLINIC_OR_DEPARTMENT_OTHER): Payer: Medicare Other

## 2016-03-06 VITALS — BP 126/57 | HR 75 | Temp 97.7°F | Resp 18

## 2016-03-06 DIAGNOSIS — Z452 Encounter for adjustment and management of vascular access device: Secondary | ICD-10-CM | POA: Diagnosis not present

## 2016-03-06 DIAGNOSIS — C184 Malignant neoplasm of transverse colon: Secondary | ICD-10-CM

## 2016-03-06 MED ORDER — OXALIPLATIN CHEMO INJECTION 100 MG/20ML
65.0000 mg/m2 | Freq: Once | INTRAVENOUS | Status: AC
  Administered 2016-03-06: 160 mg via INTRAVENOUS
  Filled 2016-03-06: qty 32

## 2016-03-06 MED ORDER — DEXTROSE 5 % IV SOLN
Freq: Once | INTRAVENOUS | Status: AC
  Administered 2016-03-06: 09:00:00 via INTRAVENOUS

## 2016-03-06 MED ORDER — DEXTROSE 5 % IV SOLN
400.0000 mg/m2 | Freq: Once | INTRAVENOUS | Status: AC
  Administered 2016-03-06: 972 mg via INTRAVENOUS
  Filled 2016-03-06: qty 48.6

## 2016-03-06 MED ORDER — PALONOSETRON HCL INJECTION 0.25 MG/5ML
INTRAVENOUS | Status: AC
  Filled 2016-03-06: qty 5

## 2016-03-06 MED ORDER — FLUOROURACIL CHEMO INJECTION 2.5 GM/50ML
400.0000 mg/m2 | Freq: Once | INTRAVENOUS | Status: AC
  Administered 2016-03-06: 950 mg via INTRAVENOUS
  Filled 2016-03-06: qty 19

## 2016-03-06 MED ORDER — PALONOSETRON HCL INJECTION 0.25 MG/5ML
0.2500 mg | Freq: Once | INTRAVENOUS | Status: AC
  Administered 2016-03-06: 0.25 mg via INTRAVENOUS

## 2016-03-06 MED ORDER — SODIUM CHLORIDE 0.9 % IV SOLN
2400.0000 mg/m2 | INTRAVENOUS | Status: DC
  Administered 2016-03-06: 5850 mg via INTRAVENOUS
  Filled 2016-03-06: qty 117

## 2016-03-06 MED ORDER — SODIUM CHLORIDE 0.9 % IV SOLN
10.0000 mg | Freq: Once | INTRAVENOUS | Status: AC
  Administered 2016-03-06: 10 mg via INTRAVENOUS
  Filled 2016-03-06: qty 1

## 2016-03-06 NOTE — Progress Notes (Signed)
Blood return noted before and after Adrucil push. Pt stable at time of discharge.

## 2016-03-06 NOTE — Patient Instructions (Signed)
[facility] Discharge Instructions for Patients Receiving Chemotherapy  Today you received the following chemotherapy agents, Oxaliplatin, Leucovorin, Adrucil  To help prevent nausea and vomiting after your treatment, we encourage you to take your nausea medication as directed.    If you develop nausea and vomiting that is not controlled by your nausea medication, call the clinic.   BELOW ARE SYMPTOMS THAT SHOULD BE REPORTED IMMEDIATELY:  *FEVER GREATER THAN 100.5 F  *CHILLS WITH OR WITHOUT FEVER  NAUSEA AND VOMITING THAT IS NOT CONTROLLED WITH YOUR NAUSEA MEDICATION  *UNUSUAL SHORTNESS OF BREATH  *UNUSUAL BRUISING OR BLEEDING  TENDERNESS IN MOUTH AND THROAT WITH OR WITHOUT PRESENCE OF ULCERS  *URINARY PROBLEMS  *BOWEL PROBLEMS  UNUSUAL RASH Items with * indicate a potential emergency and should be followed up as soon as possible.  Feel free to call the clinic you have any questions or concerns. The clinic phone number is [phone].  Please show the [place] at check-in to the Emergency Department and triage nurse.

## 2016-03-06 NOTE — Telephone Encounter (Signed)
Patient son stopped by scheduling to have labs added to tx per infusion nurse. No los completed for 9/26 visit.   Added lab for 10/11 and 10/25 - gave son new schedule for October. Left message for desk nurse to confirm labs needed for 10/11 and 10/25 and when next f/u should be.   Son aware GK currently no in office on Wednesdays and treatments days may need to be moved to tuesdays or thursdays as patient does not wish to continue to split appointments.

## 2016-03-08 ENCOUNTER — Ambulatory Visit (HOSPITAL_BASED_OUTPATIENT_CLINIC_OR_DEPARTMENT_OTHER): Payer: Medicare Other

## 2016-03-08 VITALS — BP 130/62 | HR 71 | Temp 97.5°F | Resp 18

## 2016-03-08 DIAGNOSIS — C184 Malignant neoplasm of transverse colon: Secondary | ICD-10-CM | POA: Diagnosis not present

## 2016-03-08 DIAGNOSIS — Z452 Encounter for adjustment and management of vascular access device: Secondary | ICD-10-CM | POA: Diagnosis not present

## 2016-03-08 MED ORDER — SODIUM CHLORIDE 0.9% FLUSH
10.0000 mL | INTRAVENOUS | Status: DC | PRN
  Administered 2016-03-08: 10 mL
  Filled 2016-03-08: qty 10

## 2016-03-08 MED ORDER — HEPARIN SOD (PORK) LOCK FLUSH 100 UNIT/ML IV SOLN
500.0000 [IU] | Freq: Once | INTRAVENOUS | Status: AC | PRN
  Administered 2016-03-08: 500 [IU]
  Filled 2016-03-08: qty 5

## 2016-03-08 NOTE — Patient Instructions (Signed)

## 2016-03-08 NOTE — Progress Notes (Signed)
Marland Kitchen    HEMATOLOGY/ONCOLOGY CLINIC NOTE  Date of Service:03/05/2016  Patient Care Team: [name], MD as PCP - General (Endocrinology)  CHIEF COMPLAINTS - follow-up for chemotherapy for colon cancer  Diagnosis: Stage IIIB Colon Cancer diagnosed 11/10/2015  Current treatment  Adjuvant FOLFOX status post 3 cycles. Bolus 5-FU was held for cycle 3 due to neutropenia  Previous treatment -  left hemicolectomy 11/10/2015 - Port-A-Cath placement 12/29/2015  INTERVAL HISTORY  Patient is here for his scheduled follow-up prior to his fifth cycle of FOLFOX chemotherapy . He notes that he is feeling okay and has less fatigue with the dose reduction and Oxaloplatin. No new tingling or numbness in his hands or feet. Notes reasonable oral intake. Blood sugars still not well controlled. No diabetes medication changes by his primary care physician done since his last visit. Given his renal function we will discontinue his metformin and he was started on low-dose glipizide with plans to slowly increase dose as needed. We also talked about following a diabetic diet and adequate oral fluid intake. His creatinine is better today and appears to be closer to his baseline. No evidence of overt GI bleeding. ANC is within normal limits.    MEDICAL HISTORY:  Past Medical History:  Diagnosis Date  . CKD (chronic kidney disease) stage 3, GFR 30-59 ml/min   . Complete heart block ([place])    s. 05/02/2015 s/p BSX U128 [place] CRT-P Beckie Salts).  Marland Kitchen GIB (gastrointestinal bleeding)    a. 10/2015- Hgb 4.6-->8u PRBC's;  b. 10/2015 EGD: non bleeding H pylori + ulceration; c. 10/2015 Colonoscopy: invasive adenocarcinoma.  . History of cardiomyopathy    a. Nonischemic, possibly tachycardia mediated;  b. 04/2015 Echo:  EF 60-65%, no rwma, mild AI/MR, mod dil LA/RA, PASP 69mHg.  .Marland KitchenHypertension   . Hypertensive heart disease   . OSA (obstructive sleep apnea), pt with apnea during procedures 10/11/2011   Denies  .  Paroxysmal atrial fibrillation (H[facility])    a. CHA2DS2VASc = 2-3 (Xarelto).  . Stage IIIB Colon Cancer    a. 11/2015 Colonscopy: invasive adenocarcinoma;  b. 11/2015 s/p lap L colectomy; c. Pathology: pT3, pN1c Mx, MSI stable.  . Type 2 diabetes mellitus ([facility]     SURGICAL HISTORY: Past Surgical History:  Procedure Laterality Date  . CARDIAC CATHETERIZATION    . CARDIOVERSION  10/11/2011   Procedure: CARDIOVERSION;  Surgeon: DLeonie Man MD;  [facility]  Service: Cardiovascular;  Laterality: N/A;  . COLONOSCOPY N/A 11/05/2015   Procedure: COLONOSCOPY;  Surgeon: RRonald Lobo MD;  [facility];  Service: Endoscopy;  Laterality: N/A;  . EP IMPLANTABLE DEVICE N/A 05/02/2015   Procedure: BiV Pacemaker Insertion CRT-P;  Surgeon: GEvans Lance MD;  [facility];  Service: Cardiovascular;  Laterality: N/A;  . ESOPHAGOGASTRODUODENOSCOPY N/A 11/05/2015   Procedure: ESOPHAGOGASTRODUODENOSCOPY (EGD);  Surgeon: RRonald Lobo MD;  [facility];  Service: Endoscopy;  Laterality: N/A;  . EYE SURGERY  2015   left cataract with lens implant  . LAPAROSCOPIC PARTIAL COLECTOMY N/A 11/10/2015   Procedure: LAPAROSCOPIC LEFT COLECTOMY LAPAROSCOPIC MOBILIZATION OF SPLENIC FLEXURE;  Surgeon: EGreer Pickerel MD;  [place] [facility]  Service: General;  Laterality: N/A;  . Nuclear stress  11/01/2011   Severe global hypokinesis,dilated ventricle  . PORTACATH PLACEMENT N/A 12/29/2015   Procedure: INSERTION PORT-A-CATH WITH ULTRASOUND GUIDANCE;  Surgeon: EGreer Pickerel MD;  [facility];  Service: General;  Laterality: N/A;  . TEE WITHOUT CARDIOVERSION  10/10/2011  Procedure: TRANSESOPHAGEAL ECHOCARDIOGRAM (TEE);  Surgeon: Sanda Klein, MD;  Location: [facility];  Service: Cardiovascular;  Laterality: N/A;    SOCIAL HISTORY: Social History   Social History  . Marital status: Married    Spouse name: N/A  . Number of children: N/A  . Years of education: N/A   Occupational  History  . Not on file.   Social History Main Topics  . Smoking status: Never Smoker  . Smokeless tobacco: Never Used  . Alcohol use No  . Drug use: No  . Sexual activity: Not Currently   Other Topics Concern  . Not on file   Social History Narrative  . No narrative on file    FAMILY HISTORY: Family History  Problem Relation Age of Onset  . Colon cancer Mother   . Kidney disease Father     ESRF/HD, deceased    ALLERGIES:  is allergic to amiodarone and allopurinol.  MEDICATIONS:  Current Outpatient Prescriptions  Medication Sig Dispense Refill  . b complex vitamins capsule Take 1 capsule by mouth daily. 30 capsule 3  . benazepril (LOTENSIN) 40 MG tablet TAKE 1 TABLET (40 MG TOTAL) BY MOUTH DAILY. 30 tablet 6  . carvedilol (COREG) 25 MG tablet TAKE A HALF TABLET (12.5 MG) EVERY MORNING AND A WHOLE TABLET (25 MG) EVERY EVENING 135 tablet 0  . chlorhexidine (PERIDEX) 0.12 % solution Use as directed 15 mLs in the mouth or throat 2 (two) times daily. 473 mL 0  . dexamethasone (DECADRON) 4 MG tablet Take 2 tablets (8 mg total) by mouth daily. Start the day after chemotherapy for 2 days. Take with food. 30 tablet 1  . doxazosin (CARDURA) 8 MG tablet TAKE 1 TABLET (8 MG TOTAL) BY MOUTH AT BEDTIME. 30 tablet 9  . ferrous sulfate 325 (65 FE) MG EC tablet Take 1 tablet (325 mg total) by mouth daily with breakfast. 30 tablet 0  . furosemide (LASIX) 80 MG tablet TAKE 1 TABLET (80 MG TOTAL) BY MOUTH DAILY. 60 tablet 1  . lidocaine-prilocaine (EMLA) cream Apply to affected area once 30 g 3  . ondansetron (ZOFRAN) 8 MG tablet Take 1 tablet (8 mg total) by mouth 2 (two) times daily as needed for refractory nausea / vomiting. Start on day 3 after chemotherapy. 30 tablet 1  . potassium chloride (KLOR-CON M10) 10 MEQ tablet Take 2 tablets (20 mEq total) by mouth daily. 60 tablet 6  . prochlorperazine (COMPAZINE) 10 MG tablet Take 1 tablet (10 mg total) by mouth every 6 (six) hours as needed  (Nausea or vomiting). 30 tablet 1  . rosuvastatin (CRESTOR) 20 MG tablet TAKE 1 TABLET (20 MG TOTAL) BY MOUTH AT BEDTIME. 30 tablet 10  . sitaGLIPtin (JANUVIA) 100 MG tablet Take 100 mg by mouth daily.    Alveda Reasons 20 MG TABS tablet TAKE 1 TABLET BY MOUTH DAILY 30 tablet 5  . glipiZIDE (GLUCOTROL) 5 MG tablet Take 0.5 tablets (2.5 mg total) by mouth daily before breakfast. If Blood sugar remains above 200 increased to 2.5 po twice daily 30 tablet 0   No current facility-administered medications for this visit.     REVIEW OF SYSTEMS:    10 Point review of Systems was done is negative except as noted above.  PHYSICAL EXAMINATION: ECOG PERFORMANCE STATUS: 2 - Symptomatic, <50% confined to bed  . Vitals:   03/05/16 1500  BP: 130/67  Pulse: 80  Resp: 18  Temp: 98.4 F (36.9 C)   Filed Weights  03/05/16 1500  Weight: 230 lb 9.6 oz (104.6 kg)   .Body mass index is 28.82 kg/m.   . Wt Readings from Last 3 Encounters:  03/05/16 230 lb 9.6 oz (104.6 kg)  02/22/16 235 lb 9.6 oz (106.9 kg)  02/07/16 241 lb 4.8 oz (109.5 kg)   GENERAL:alert, in no acute distress and comfortable SKIN: skin color, texture, turgor are normal, no rashes or significant lesions EYES: normal, conjunctiva are pink and non-injected, sclera clear OROPHARYNX:no exudate, no erythema and lips, buccal mucosa, and tongue normal  NECK: supple, no JVD, thyroid normal size, non-tender, without nodularity LYMPH:  no palpable lymphadenopathy in the cervical, axillary or inguinal LUNGS: clear to auscultation with normal respiratory effort HEART: regular rate & rhythm,  no murmurs and no lower extremity edema ABDOMEN: Abdominal surgical incision has healed with minimal superficial crusting remaining . Musculoskeletal: no cyanosis of digits and no clubbing  PSYCH: alert & oriented x 3 with fluent speech NEURO: no focal motor/sensory deficits  LABORATORY DATA:  I have reviewed the data as listed  . CBC Latest Ref  Rng & Units 03/05/2016 02/22/2016 02/07/2016  WBC 4.0 - 10.3 10e3/uL 3.4(L) 3.1(L) 2.3(L)  Hemoglobin 13.0 - 17.1 g/dL 9.7(L) 10.4(L) 10.8(L)  Hematocrit 38.4 - 49.9 % 28.6(L) 31.0(L) 31.8(L)  Platelets 140 - 400 10e3/uL 119(L) 97(L) 102(L)   . CBC    Component Value Date/Time   WBC 3.4 (L) 03/05/2016 1422   WBC 5.8 12/25/2015 0840   RBC 3.72 (L) 03/05/2016 1422   RBC 4.40 12/25/2015 0840   HGB 9.7 (L) 03/05/2016 1422   HCT 28.6 (L) 03/05/2016 1422   PLT 119 (L) 03/05/2016 1422   MCV 76.9 (L) 03/05/2016 1422   MCH 26.1 (L) 03/05/2016 1422   MCH 23.9 (L) 12/25/2015 0840   MCHC 33.9 03/05/2016 1422   MCHC 32.1 12/25/2015 0840   RDW 17.6 (H) 03/05/2016 1422   LYMPHSABS 1.3 03/05/2016 1422   MONOABS 0.3 03/05/2016 1422   EOSABS 0.0 03/05/2016 1422   BASOSABS 0.0 03/05/2016 1422    . CMP Latest Ref Rng & Units 03/05/2016 02/22/2016 02/07/2016  Glucose 70 - 140 mg/dl 261(H) 332(H) 292(H)  BUN 7.0 - 26.0 mg/dL 14.8 20.7 19.0  Creatinine 0.7 - 1.3 mg/dL 1.8(H) 2.3(H) 1.8(H)  Sodium 136 - 145 mEq/L 138 138 137  Potassium 3.5 - 5.1 mEq/L 4.0 4.1 4.1  Chloride 101 - 111 mmol/L - - -  CO2 22 - 29 mEq/L 25 26 27   Calcium 8.4 - 10.4 mg/dL 10.1 9.9 10.4  Total Protein 6.4 - 8.3 g/dL 6.6 6.7 6.7  Total Bilirubin 0.20 - 1.20 mg/dL 1.05 0.94 0.89  Alkaline Phos 40 - 150 U/L 76 88 82  AST 5 - 34 U/L 18 21 19   ALT 0 - 55 U/L 15 21 17    Component     Latest Ref Rng 11/22/2015  Iron     42 - 163 ug/dL 24 (L)  TIBC     202 - 409 ug/dL 244  UIBC     117 - 376 ug/dL 219  %SAT     20 - 55 % 10 (L)  CEA     0.0 - 4.7 ng/mL 0.9  Ferritin     22 - 316 ng/ml 415 (H)  Vitamin B12     211 - 946 pg/mL 276    Component     Latest Ref Rng & Units 01/09/2016 01/24/2016  Ferritin     22 - 316 ng/ml  54 100  Vitamin B12     211 - 946 pg/mL  444    RADIOGRAPHIC STUDIES: I have personally reviewed the radiological images as listed and agreed with the findings in the report. No results  found.  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 Patient is s/p left sided colectomy on 11/10/2015 . Surgical incision Has completely healed up.  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 Patient received 8 units of PRBC and IV iron in the hospital unusual presentation. Hgb improving. B12 was borderline low- improved now. Post-operative CEA WNL 3) HTN - controlled.  4) DM2 - uncontrolled in the setting of chemotherapy and steroid use. 5) Non ischemic chronic diastolic CHF 6) P. a fib on Xarelto per cardiology - will need to be mindful of his renal function. 7) CKD 3 with some increase in the creatinine over baseline. No back to baseline with a creatinine of 1.8. Patient is also on ACE inhibitor as per primary care physician and diuretics which will need to be monitored closely. 8) Duodenal ulceration (h pylori -positive) - completed Abx for rx. On PPI 9) thrombocytopenia likely related to chemotherapy. Reasonable at [facility].  PLAN --Patient's labs and stable. Creatinine is improved. ANC within normal limits. Platelets improved. --Will continue FOLFOX with reduced Oxaloplatin 65 mg/m due to bump in creatinine and borderline thrombocytopenia. --Given his creatinine more than 1.5 we will hold his metformin. -we 'll start him on low-dose glipizide 2.5 mg by mouth twice a day and gradually increase it as needed needed patient will be monitoring his blood sugars at home. -Patient needs close follow-up with his primary care physician to optimize his diabetes treatment due to uncontrolled hyperglycemia which is putting additional strain on his kidneys. --Also he is on multiple antihypertensives including Lasix and and ace inhibitors which puts him at high risk for fluctuations and renal function based on volume status and ACE inhibitor. This will need to be closely monitored with his primary care  physician as well. -continue po replacement of B12/Bcomplex and continue po iron. -continue to maintain ambulation and good po intake -continue f/u with PCP for management of other medical problems.  Return to care in 2 week with Dr. Irene Limbo with labs prior to C6 of FOLFOX.  I spent 20 minutes counseling the patient face to face. The total time spent in the appointment was 25 minutes and more than 50% was on counseling and direct patient cares.    Sullivan Lone MD [place] AAHIVMS [facility] Hematology/Oncology [facility]ician [facility]  (Office):       [phone] (Work cell):  [phone] (Fax):           [phone]

## 2016-03-10 ENCOUNTER — Telehealth: Payer: Self-pay | Admitting: Hematology

## 2016-03-10 NOTE — Telephone Encounter (Signed)
Lvm advising appt added for 10/10 @ 3.45pm.

## 2016-03-11 ENCOUNTER — Encounter: Payer: Self-pay | Admitting: Cardiovascular Disease

## 2016-03-11 ENCOUNTER — Ambulatory Visit (INDEPENDENT_AMBULATORY_CARE_PROVIDER_SITE_OTHER): Payer: Medicare Other | Admitting: Cardiovascular Disease

## 2016-03-11 VITALS — BP 123/74 | HR 74 | Ht 75.0 in | Wt 221.4 lb

## 2016-03-11 DIAGNOSIS — Z950 Presence of cardiac pacemaker: Secondary | ICD-10-CM | POA: Diagnosis not present

## 2016-03-11 DIAGNOSIS — I5032 Chronic diastolic (congestive) heart failure: Secondary | ICD-10-CM

## 2016-03-11 DIAGNOSIS — E782 Mixed hyperlipidemia: Secondary | ICD-10-CM

## 2016-03-11 DIAGNOSIS — I442 Atrioventricular block, complete: Secondary | ICD-10-CM | POA: Diagnosis not present

## 2016-03-11 DIAGNOSIS — E118 Type 2 diabetes mellitus with unspecified complications: Secondary | ICD-10-CM

## 2016-03-11 DIAGNOSIS — I10 Essential (primary) hypertension: Secondary | ICD-10-CM

## 2016-03-11 DIAGNOSIS — C184 Malignant neoplasm of transverse colon: Secondary | ICD-10-CM

## 2016-03-11 DIAGNOSIS — I480 Paroxysmal atrial fibrillation: Secondary | ICD-10-CM

## 2016-03-11 DIAGNOSIS — Z7901 Long term (current) use of anticoagulants: Secondary | ICD-10-CM

## 2016-03-11 DIAGNOSIS — G4733 Obstructive sleep apnea (adult) (pediatric): Secondary | ICD-10-CM

## 2016-03-11 MED ORDER — POTASSIUM CHLORIDE CRYS ER 10 MEQ PO TBCR: 10.0000 meq | EXTENDED_RELEASE_TABLET | ORAL | 11 refills | Status: DC

## 2016-03-11 MED ORDER — FUROSEMIDE 40 MG PO TABS: 40.0000 mg | ORAL_TABLET | ORAL | 11 refills | Status: DC

## 2016-03-11 NOTE — Patient Instructions (Signed)
Dr Sallyanne Kuster has recommended making the following medication changes: 1. DECREASE Furosemide to 40 mg every OTHER day 2. DECREASE Potassium to 10 mEq every OTHER day  Remote monitoring is used to monitor your Pacemaker of ICD from home. This monitoring reduces the number of office visits required to check your device to one time per year. It allows [place] to keep an eye on the functioning of your device to ensure it is working properly. You are scheduled for a device check from home on Tuesday, January 2nd, 2018. You may send your transmission at any time that day. If you have a wireless device, the transmission will be sent automatically. After your physician reviews your transmission, you will receive a postcard with your next transmission date.  Dr Sallyanne Kuster recommends that you schedule a follow-up appointment in 6 months with a pacemaker check. You will receive a reminder letter in the mail two months in advance. If you don't receive a letter, please call our office to schedule the follow-up appointment.  If you need a refill on your cardiac medications before your next appointment, please call your pharmacy.

## 2016-03-11 NOTE — Progress Notes (Signed)
Cardiology Office Note    Date:  03/11/2016   ID:  Ernest Lara, DOB 20-Nov-1940, MRN [id number]  PCP:  [name], MD  Cardiologist:   Sanda Klein, MD   Chief Complaint  Patient presents with  . Follow-up    6 Months; device check; Pt states no Sx.     History of Present Illness:  Ernest Lara is a 75 y.o. male with complete heart block, nonischemic cardiomyopathy, compensated diastolic heart failure, hypertension, chronic kidney disease, type 2 diabetes mellitus, obstructive sleep apnea and recent left hemicolectomy for colon cancer. He is currently undergoing chemotherapy and is steadily losing important amounts of weight. He weighs 44 pounds less than he did in February. He is having problems with taste aversion and poor by mouth intake. He cannot eat or drink anything cold and is even having difficulty drinking water.  She denies any overt bleeding. He denies abdominal pain. He has not had palpitations, dizziness, syncope, dyspnea or chest pain.  Device interrogation shows normal function. His dual-chamber [employer] valitude X4 CRT-P device implanted November 2016 has roughly 10.5 years of anticipated longevity. All lead parameters are excellent. There are no detected R waves. LV lead is programmed LV ring 3-LV ring 2. There is 99% biventricular pacing. There has been no atrial fibrillation. 2 brief episodes of nonsustained ventricular tachycardia have been recorded since February, most recently on August 24  Past Medical History:  Diagnosis Date  . CKD (chronic kidney disease) stage 3, GFR 30-59 ml/min   . Complete heart block (Ernest Lara)    s. 05/02/2015 s/p BSX U128 Valitude CRT-P Ernest Lara).  Marland Kitchen GIB (gastrointestinal bleeding)    a. 10/2015- Hgb 4.6-->8u PRBC's;  b. 10/2015 EGD: non bleeding H pylori + ulceration; c. 10/2015 Colonoscopy: invasive adenocarcinoma.  . History of cardiomyopathy    a. Nonischemic, possibly tachycardia mediated;  b. 04/2015 Echo:  EF  60-65%, no rwma, mild AI/MR, mod dil LA/RA, PASP 40mHg.  .Marland KitchenHypertension   . Hypertensive heart disease   . OSA (obstructive sleep apnea), pt with apnea during procedures 10/11/2011   Denies  . Paroxysmal atrial fibrillation (Ernest Lara)    a. CHA2DS2VASc = 2-3 (Xarelto).  . Stage IIIB Colon Cancer    a. 11/2015 Colonscopy: invasive adenocarcinoma;  b. 11/2015 s/p lap L colectomy; c. Pathology: pT3, pN1c Mx, MSI stable.  . Type 2 diabetes mellitus ([place]     Past Surgical History:  Procedure Laterality Date  . CARDIAC CATHETERIZATION    . CARDIOVERSION  10/11/2011   Procedure: CARDIOVERSION;  Surgeon: DLeonie Man MD;  [facility]  Service: Cardiovascular;  Laterality: N/A;  . COLONOSCOPY N/A 11/05/2015   Procedure: COLONOSCOPY;  Surgeon: RRonald Lobo MD;  [facility];  Service: Endoscopy;  Laterality: N/A;  . EP IMPLANTABLE DEVICE N/A 05/02/2015   Procedure: BiV Pacemaker Insertion CRT-P;  Surgeon: GEvans Lance MD;  [place] [facility];  Service: Cardiovascular;  Laterality: N/A;  . ESOPHAGOGASTRODUODENOSCOPY N/A 11/05/2015   Procedure: ESOPHAGOGASTRODUODENOSCOPY (EGD);  Surgeon: RRonald Lobo MD;  [facility];  Service: Endoscopy;  Laterality: N/A;  . EYE SURGERY  2015   left cataract with lens implant  . LAPAROSCOPIC PARTIAL COLECTOMY N/A 11/10/2015   Procedure: LAPAROSCOPIC LEFT COLECTOMY LAPAROSCOPIC MOBILIZATION OF SPLENIC FLEXURE;  Surgeon: EGreer Pickerel MD;  [place] [facility]  Service: General;  Laterality: N/A;  . Nuclear stress  11/01/2011   Severe global hypokinesis,dilated ventricle  . PORTACATH PLACEMENT N/A 12/29/2015  Procedure: INSERTION PORT-A-CATH WITH ULTRASOUND GUIDANCE;  Surgeon: Greer Pickerel, MD;  [facility];  Service: General;  Laterality: N/A;  . TEE WITHOUT CARDIOVERSION  10/10/2011   Procedure: TRANSESOPHAGEAL ECHOCARDIOGRAM (TEE);  Surgeon: Sanda Klein, MD;  [facility];  Service: Cardiovascular;  Laterality: N/A;      Current Medications: Outpatient Medications Prior to Visit  Medication Sig Dispense Refill  . b complex vitamins capsule Take 1 capsule by mouth daily. 30 capsule 3  . benazepril (LOTENSIN) 40 MG tablet TAKE 1 TABLET (40 MG TOTAL) BY MOUTH DAILY. 30 tablet 6  . carvedilol (COREG) 25 MG tablet TAKE A HALF TABLET (12.5 MG) EVERY MORNING AND A WHOLE TABLET (25 MG) EVERY EVENING 135 tablet 0  . chlorhexidine (PERIDEX) 0.12 % solution Use as directed 15 mLs in the mouth or throat 2 (two) times daily. 473 mL 0  . dexamethasone (DECADRON) 4 MG tablet Take 2 tablets (8 mg total) by mouth daily. Start the day after chemotherapy for 2 days. Take with food. 30 tablet 1  . doxazosin (CARDURA) 8 MG tablet TAKE 1 TABLET (8 MG TOTAL) BY MOUTH AT BEDTIME. 30 tablet 9  . ferrous sulfate 325 (65 FE) MG EC tablet Take 1 tablet (325 mg total) by mouth daily with breakfast. 30 tablet 0  . glipiZIDE (GLUCOTROL) 5 MG tablet Take 0.5 tablets (2.5 mg total) by mouth daily before breakfast. If Blood sugar remains above 200 increased to 2.5 po twice daily 30 tablet 0  . lidocaine-prilocaine (EMLA) cream Apply to affected area once 30 g 3  . ondansetron (ZOFRAN) 8 MG tablet Take 1 tablet (8 mg total) by mouth 2 (two) times daily as needed for refractory nausea / vomiting. Start on day 3 after chemotherapy. 30 tablet 1  . prochlorperazine (COMPAZINE) 10 MG tablet Take 1 tablet (10 mg total) by mouth every 6 (six) hours as needed (Nausea or vomiting). 30 tablet 1  . rosuvastatin (CRESTOR) 20 MG tablet TAKE 1 TABLET (20 MG TOTAL) BY MOUTH AT BEDTIME. 30 tablet 10  . sitaGLIPtin (JANUVIA) 100 MG tablet Take 100 mg by mouth daily.    Alveda Reasons 20 MG TABS tablet TAKE 1 TABLET BY MOUTH DAILY 30 tablet 5  . furosemide (LASIX) 80 MG tablet TAKE 1 TABLET (80 MG TOTAL) BY MOUTH DAILY. 60 tablet 1  . potassium chloride (KLOR-CON M10) 10 MEQ tablet Take 2 tablets (20 mEq total) by mouth daily. 60 tablet 6   No  facility-administered medications prior to visit.      Allergies:   Amiodarone and Allopurinol   Social History   Social History  . Marital status: Married    Spouse name: N/A  . Number of children: N/A  . Years of education: N/A   Social History Main Topics  . Smoking status: Never Smoker  . Smokeless tobacco: Never Used  . Alcohol use No  . Drug use: No  . Sexual activity: Not Currently   Other Topics Concern  . None   Social History Narrative  . None     Family History:  The patient's family history includes Colon cancer in his mother; Kidney disease in his father.   ROS:   Please see the history of present illness.    ROS All other systems reviewed and are negative.   PHYSICAL EXAM:   VS:  BP 123/74   Pulse 74   Ht 6' 3"  (1.905 m)   Wt 100.4 kg (221 lb 6.4 oz)  BMI 27.67 kg/m    GEN: Well nourished, well developed, in no acute distress  HEENT: normal  Neck: no JVD, carotid bruits, or masses Cardiac: RRR; no murmurs, rubs, or gallops,no edema , Healthy pacemaker site Respiratory:  clear to auscultation bilaterally, normal work of breathing GI: soft, nontender, nondistended, + BS MS: no deformity or atrophy  Skin: warm and dry, no rash Neuro:  Alert and Oriented x 3, Strength and sensation are intact Psych: euthymic mood, full affect  Wt Readings from Last 3 Encounters:  03/11/16 100.4 kg (221 lb 6.4 oz)  03/05/16 104.6 kg (230 lb 9.6 oz)  02/22/16 106.9 kg (235 lb 9.6 oz)      Studies/Labs Reviewed:   EKG:  EKG is ordered today.  The ekg ordered today demonstrates Atrial sensed biventricular paced, QTC 425 ms  Recent Labs: 05/01/2015: TSH 2.845 11/03/2015: B Natriuretic Peptide 215.8 03/05/2016: ALT 15; BUN 14.8; Creatinine 1.8; HGB 9.7; Platelets 119; Potassium 4.0; Sodium 138   Lipid Panel    Component Value Date/Time   CHOL 87 10/09/2011 0523   TRIG 82 10/09/2011 0523   HDL 56 10/09/2011 0523   CHOLHDL 1.6 10/09/2011 0523   VLDL 16  10/09/2011 0523   LDLCALC 15 10/09/2011 0523    Additional studies/ records that were reviewed today include:  Records from oncology clinic visits    ASSESSMENT:    1. Complete heart block ([place])   2. Biventricular cardiac pacemaker in situ   3. Chronic diastolic heart failure ([place])   4. PAF (paroxysmal atrial fibrillation), recent DCCV 10/11/11 to SR.   5. Long term current use of anticoagulant therapy   6. Essential hypertension   7. Type 2 diabetes mellitus with complication, without long-term current use of insulin ([place])   8. Mixed hyperlipidemia   9. OSA (obstructive sleep apnea), pt with apnea during procedures   10. Malignant neoplasm of transverse colon ([place])      PLAN:  In order of problems listed above:  1. CHB: Pacemaker dependent. 2. CRT-P: normal device function with excellent biventricular pacing at 99%. Remote download in 3 months and office visit in 6 months 3. CHF: History of depressed left ventricular systolic function, partly tachycardia related cardiomyopathy, partly LBBB-related dyssynchrony, resolved. Most recent EF 60-65% after CRT-P. On ACE inhibitor and carvedilol in maximum doses. Has lost substantial weight and appears to lead lower doses of loop diuretics. We'll reduce furosemide to 40 mg every other day and potassium to 10 mEq every other day. 4. AFib:  No atrial fibrillation recorded on his device. CHADSVasc 4 (age, DM, HTN, HF). 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 5. Anticoagulation:Appropriately anticoagulated without overt bleeding. He is mildly anemic but there has been a minimal change in hemoglobin over the last couple of weeks, probably related to chemotherapy and poor oral intake. 6. HTN: Well-controlled. If he starts having problems with low blood pressure as he loses weight would switch Cardura to tamsulosin or silodosin. 7. DM: recent adjustments in medicines made by his primary care physician. 8. HLP: Statin, will need  to review this as he loses weight 9. OSA: Reports compliance with CPAP. 10. Undergoing chemotherapy    Medication Adjustments/Labs and Tests Ordered: Current medicines are reviewed at length with the patient today.  Concerns regarding medicines are outlined above.  Medication changes, Labs and Tests ordered today are listed in the Patient Instructions below. Patient Instructions  Dr Sallyanne Kuster has recommended making the following medication changes: 1. DECREASE Furosemide to 40 mg  every OTHER day 2. DECREASE Potassium to 10 mEq every OTHER day  Remote monitoring is used to monitor your Pacemaker of ICD from home. This monitoring reduces the number of office visits required to check your device to one time per year. It allows [place] to keep an eye on the functioning of your device to ensure it is working properly. You are scheduled for a device check from home on Tuesday, January 2nd, 2018. You may send your transmission at any time that day. If you have a wireless device, the transmission will be sent automatically. After your physician reviews your transmission, you will receive a postcard with your next transmission date.  Dr Sallyanne Kuster recommends that you schedule a follow-up appointment in 6 months with a pacemaker check. You will receive a reminder letter in the mail two months in advance. If you don't receive a letter, please call our office to schedule the follow-up appointment.  If you need a refill on your cardiac medications before your next appointment, please call your pharmacy.    Signed, Sanda Klein, MD  03/11/2016 10:21 AM    [facility] [place], [place], [place]  [address] Phone: [phone]; Fax: [phone]

## 2016-03-13 ENCOUNTER — Ambulatory Visit: Payer: Medicare Other

## 2016-03-13 LAB — CUP PACEART REMOTE DEVICE CHECK
Battery Remaining Longevity: 126 mo
Battery Remaining Percentage: 100 %
Brady Statistic RV Percent Paced: 99 %
Implantable Lead Implant Date: 20161122
Implantable Lead Location: 753858
Implantable Lead Location: 753859
Implantable Lead Location: 753860
Implantable Lead Model: 7742
Implantable Lead Serial Number: 689802
Implantable Lead Serial Number: 693952
Lead Channel Impedance Value: 595 Ohm
Lead Channel Impedance Value: 961 Ohm
Lead Channel Pacing Threshold Amplitude: 0.6 V
Lead Channel Pacing Threshold Amplitude: 0.7 V
Lead Channel Pacing Threshold Pulse Width: 0.4 ms
Lead Channel Pacing Threshold Pulse Width: 0.4 ms
Lead Channel Setting Pacing Amplitude: 2 V
Lead Channel Setting Pacing Amplitude: 2.5 V
Lead Channel Setting Pacing Pulse Width: 0.4 ms
Lead Channel Setting Pacing Pulse Width: 0.4 ms
Lead Channel Setting Sensing Sensitivity: 2.5 mV
MDC IDC LEAD IMPLANT DT: 20161122
MDC IDC LEAD IMPLANT DT: 20161122
MDC IDC LEAD SERIAL: 521932
MDC IDC MSMT LEADCHNL LV PACING THRESHOLD AMPLITUDE: 1 V
MDC IDC MSMT LEADCHNL LV PACING THRESHOLD PULSEWIDTH: 0.4 ms
MDC IDC MSMT LEADCHNL RV IMPEDANCE VALUE: 764 Ohm
MDC IDC PG SERIAL: 702386
MDC IDC SESS DTM: 20171002134200
MDC IDC SET LEADCHNL LV PACING AMPLITUDE: 2.5 V
MDC IDC SET LEADCHNL RV SENSING SENSITIVITY: 2.5 mV
MDC IDC STAT BRADY RA PERCENT PACED: 0 %

## 2016-03-15 ENCOUNTER — Encounter: Payer: Self-pay | Admitting: Cardiology

## 2016-03-19 ENCOUNTER — Other Ambulatory Visit (HOSPITAL_BASED_OUTPATIENT_CLINIC_OR_DEPARTMENT_OTHER): Payer: Medicare Other

## 2016-03-19 ENCOUNTER — Ambulatory Visit (HOSPITAL_BASED_OUTPATIENT_CLINIC_OR_DEPARTMENT_OTHER): Payer: Medicare Other | Admitting: Hematology

## 2016-03-19 VITALS — BP 126/58 | HR 86 | Temp 98.1°F | Resp 18 | Wt 227.6 lb

## 2016-03-19 DIAGNOSIS — Z9189 Other specified personal risk factors, not elsewhere classified: Secondary | ICD-10-CM

## 2016-03-19 DIAGNOSIS — C184 Malignant neoplasm of transverse colon: Secondary | ICD-10-CM | POA: Diagnosis not present

## 2016-03-19 DIAGNOSIS — N183 Chronic kidney disease, stage 3 (moderate): Secondary | ICD-10-CM

## 2016-03-19 DIAGNOSIS — D509 Iron deficiency anemia, unspecified: Secondary | ICD-10-CM | POA: Diagnosis not present

## 2016-03-19 DIAGNOSIS — I10 Essential (primary) hypertension: Secondary | ICD-10-CM

## 2016-03-19 DIAGNOSIS — D6959 Other secondary thrombocytopenia: Secondary | ICD-10-CM | POA: Diagnosis not present

## 2016-03-19 DIAGNOSIS — E1165 Type 2 diabetes mellitus with hyperglycemia: Secondary | ICD-10-CM

## 2016-03-19 LAB — CBC & DIFF AND RETIC
BASO%: 0.6 % (ref 0.0–2.0)
BASOS ABS: 0 10*3/uL (ref 0.0–0.1)
EOS ABS: 0 10*3/uL (ref 0.0–0.5)
EOS%: 1.2 % (ref 0.0–7.0)
HEMATOCRIT: 29.1 % — AB (ref 38.4–49.9)
HEMOGLOBIN: 9.8 g/dL — AB (ref 13.0–17.1)
Immature Retic Fract: 17.9 % — ABNORMAL HIGH (ref 3.00–10.60)
LYMPH%: 27.9 % (ref 14.0–49.0)
MCH: 26.3 pg — AB (ref 27.2–33.4)
MCHC: 33.7 g/dL (ref 32.0–36.0)
MCV: 78 fL — ABNORMAL LOW (ref 79.3–98.0)
MONO#: 0.5 10*3/uL (ref 0.1–0.9)
MONO%: 15.3 % — AB (ref 0.0–14.0)
NEUT#: 1.8 10*3/uL (ref 1.5–6.5)
NEUT%: 55 % (ref 39.0–75.0)
PLATELETS: 118 10*3/uL — AB (ref 140–400)
RBC: 3.73 10*6/uL — ABNORMAL LOW (ref 4.20–5.82)
RDW: 18.2 % — ABNORMAL HIGH (ref 11.0–14.6)
Retic %: 2.63 % — ABNORMAL HIGH (ref 0.80–1.80)
Retic Ct Abs: 98.1 10*3/uL — ABNORMAL HIGH (ref 34.80–93.90)
WBC: 3.3 10*3/uL — ABNORMAL LOW (ref 4.0–10.3)
lymph#: 0.9 10*3/uL (ref 0.9–3.3)

## 2016-03-19 LAB — COMPREHENSIVE METABOLIC PANEL
ALBUMIN: 3.1 g/dL — AB (ref 3.5–5.0)
ALK PHOS: 88 U/L (ref 40–150)
ALT: 12 U/L (ref 0–55)
ANION GAP: 9 meq/L (ref 3–11)
AST: 16 U/L (ref 5–34)
BUN: 10 mg/dL (ref 7.0–26.0)
CALCIUM: 9.7 mg/dL (ref 8.4–10.4)
CHLORIDE: 105 meq/L (ref 98–109)
CO2: 25 mEq/L (ref 22–29)
CREATININE: 1.8 mg/dL — AB (ref 0.7–1.3)
EGFR: 41 mL/min/{1.73_m2} — ABNORMAL LOW (ref >90)
Glucose: 333 mg/dl — ABNORMAL HIGH (ref 70–140)
POTASSIUM: 3.9 meq/L (ref 3.5–5.1)
Sodium: 139 mEq/L (ref 136–145)
Total Bilirubin: 1.11 mg/dL (ref 0.20–1.20)
Total Protein: 6.8 g/dL (ref 6.4–8.3)

## 2016-03-19 MED ORDER — GLIPIZIDE ER 5 MG PO TB24
5.0000 mg | ORAL_TABLET | Freq: Every day | ORAL | 1 refills | Status: DC
Start: 2016-03-19 — End: 2016-04-10

## 2016-03-20 ENCOUNTER — Encounter: Payer: Self-pay | Admitting: *Deleted

## 2016-03-20 ENCOUNTER — Other Ambulatory Visit: Payer: Medicare Other

## 2016-03-20 ENCOUNTER — Ambulatory Visit (HOSPITAL_BASED_OUTPATIENT_CLINIC_OR_DEPARTMENT_OTHER): Payer: Medicare Other

## 2016-03-20 VITALS — BP 126/55 | HR 80 | Temp 98.7°F | Resp 18

## 2016-03-20 DIAGNOSIS — C184 Malignant neoplasm of transverse colon: Secondary | ICD-10-CM

## 2016-03-20 DIAGNOSIS — Z5111 Encounter for antineoplastic chemotherapy: Secondary | ICD-10-CM

## 2016-03-20 MED ORDER — LEUCOVORIN CALCIUM INJECTION 350 MG
400.0000 mg/m2 | Freq: Once | INTRAVENOUS | Status: AC
  Administered 2016-03-20: 972 mg via INTRAVENOUS
  Filled 2016-03-20: qty 48.6

## 2016-03-20 MED ORDER — HEPARIN SOD (PORK) LOCK FLUSH 100 UNIT/ML IV SOLN
500.0000 [IU] | Freq: Once | INTRAVENOUS | Status: DC | PRN
  Filled 2016-03-20: qty 5

## 2016-03-20 MED ORDER — SODIUM CHLORIDE 0.9% FLUSH
10.0000 mL | INTRAVENOUS | Status: DC | PRN
  Filled 2016-03-20: qty 10

## 2016-03-20 MED ORDER — SODIUM CHLORIDE 0.9 % IV SOLN
10.0000 mg | Freq: Once | INTRAVENOUS | Status: AC
  Administered 2016-03-20: 10 mg via INTRAVENOUS
  Filled 2016-03-20: qty 1

## 2016-03-20 MED ORDER — DEXTROSE 5 % IV SOLN
Freq: Once | INTRAVENOUS | Status: AC
  Administered 2016-03-20: 11:00:00 via INTRAVENOUS

## 2016-03-20 MED ORDER — PALONOSETRON HCL INJECTION 0.25 MG/5ML
INTRAVENOUS | Status: AC
  Filled 2016-03-20: qty 5

## 2016-03-20 MED ORDER — DEXTROSE 5 % IV SOLN
65.0000 mg/m2 | Freq: Once | INTRAVENOUS | Status: AC
  Administered 2016-03-20: 160 mg via INTRAVENOUS
  Filled 2016-03-20: qty 32

## 2016-03-20 MED ORDER — SODIUM CHLORIDE 0.9 % IV SOLN
2400.0000 mg/m2 | INTRAVENOUS | Status: DC
  Administered 2016-03-20: 5850 mg via INTRAVENOUS
  Filled 2016-03-20: qty 117

## 2016-03-20 MED ORDER — FLUOROURACIL CHEMO INJECTION 2.5 GM/50ML
400.0000 mg/m2 | Freq: Once | INTRAVENOUS | Status: AC
  Administered 2016-03-20: 950 mg via INTRAVENOUS
  Filled 2016-03-20: qty 19

## 2016-03-20 MED ORDER — PALONOSETRON HCL INJECTION 0.25 MG/5ML
0.2500 mg | Freq: Once | INTRAVENOUS | Status: AC
  Administered 2016-03-20: 0.25 mg via INTRAVENOUS

## 2016-03-20 NOTE — Progress Notes (Signed)
Pharmacy aware of creat from 03/19/16 of 1.8, he has received treatment 2wks ago with this creat level. Ernest Lara will message dr Irene Limbo about this. He is at [facility] today, message also left on his cell phone.   1000: RN did speak with Dr Irene Limbo, and got verbal OK to treat with creat 1.8, from 03/19/16/.

## 2016-03-20 NOTE — Progress Notes (Signed)
Oncology Nurse Navigator Documentation  Oncology Nurse Navigator Flowsheets 03/20/2016  Navigator Location [facility]  Navigator Encounter Type Treatment  Treatment Initiated Date -  Patient Visit Type MedOnc  Treatment Phase Active Tx--FOLFOX  Barriers/Navigation Needs No barriers at this time;No Questions;No Needs  Education -  Interventions -  Education Method -  Support Groups/Services -Denies need for any support group information at this time  Acuity -  Time Spent with Patient 15

## 2016-03-22 ENCOUNTER — Ambulatory Visit (HOSPITAL_BASED_OUTPATIENT_CLINIC_OR_DEPARTMENT_OTHER): Payer: Medicare Other

## 2016-03-22 VITALS — BP 109/60 | HR 76 | Temp 97.8°F | Resp 18 | Wt 218.7 lb

## 2016-03-22 DIAGNOSIS — C184 Malignant neoplasm of transverse colon: Secondary | ICD-10-CM | POA: Diagnosis not present

## 2016-03-22 DIAGNOSIS — Z452 Encounter for adjustment and management of vascular access device: Secondary | ICD-10-CM

## 2016-03-22 MED ORDER — HEPARIN SOD (PORK) LOCK FLUSH 100 UNIT/ML IV SOLN
250.0000 [IU] | Freq: Once | INTRAVENOUS | Status: DC | PRN
  Filled 2016-03-22: qty 5

## 2016-03-22 MED ORDER — SODIUM CHLORIDE 0.9% FLUSH
10.0000 mL | INTRAVENOUS | Status: DC | PRN
  Filled 2016-03-22: qty 10

## 2016-03-25 NOTE — Progress Notes (Signed)
Ernest Lara    HEMATOLOGY/ONCOLOGY CLINIC NOTE  Date of Service:03/19/2016  Patient Care Team: [name], MD as PCP - General (Endocrinology)  CHIEF COMPLAINTS - follow-up for chemotherapy for colon cancer  Diagnosis: Stage IIIB Colon Cancer diagnosed 11/10/2015  Current treatment  Adjuvant FOLFOX status post 3 cycles. Bolus 5-FU was held for cycle 3 due to neutropenia  Previous treatment -  left hemicolectomy 11/10/2015 - Port-A-Cath placement 12/29/2015  INTERVAL HISTORY  Patient is here for his scheduled follow-up prior to his 6th cycle of FOLFOX chemotherapy . He notes no acute new symptoms. Blood sugar are better with glipizide. No new tingling/numbness or neuropathic discomfort. No fevers/chills. Renal function stable.   MEDICAL HISTORY:  Past Medical History:  Diagnosis Date  . CKD (chronic kidney disease) stage 3, GFR 30-59 ml/min   . Complete heart block ([place])    s. 05/02/2015 s/p BSX U128 Valitude CRT-P Beckie Salts).  Ernest Lara GIB (gastrointestinal bleeding)    a. 10/2015- Hgb 4.6-->8u PRBC's;  b. 10/2015 EGD: non bleeding H pylori + ulceration; c. 10/2015 Colonoscopy: invasive adenocarcinoma.  . History of cardiomyopathy    a. Nonischemic, possibly tachycardia mediated;  b. 04/2015 Echo:  EF 60-65%, no rwma, mild AI/MR, mod dil LA/RA, PASP 24mHg.  .Ernest KitchenHypertension   . Hypertensive heart disease   . OSA (obstructive sleep apnea), pt with apnea during procedures 10/11/2011   Denies  . Paroxysmal atrial fibrillation ([facility])    a. CHA2DS2VASc = 2-3 (Xarelto).  . Stage IIIB Colon Cancer    a. 11/2015 Colonscopy: invasive adenocarcinoma;  b. 11/2015 s/p lap L colectomy; c. Pathology: pT3, pN1c Mx, MSI stable.  . Type 2 diabetes mellitus ([place]     SURGICAL HISTORY: Past Surgical History:  Procedure Laterality Date  . CARDIAC CATHETERIZATION    . CARDIOVERSION  10/11/2011   Procedure: CARDIOVERSION;  Surgeon: DLeonie Man MD;  [place] [facility]  Service: Cardiovascular;  Laterality:  N/A;  . COLONOSCOPY N/A 11/05/2015   Procedure: COLONOSCOPY;  Surgeon: RRonald Lobo MD;  [facility];  Service: Endoscopy;  Laterality: N/A;  . EP IMPLANTABLE DEVICE N/A 05/02/2015   Procedure: BiV Pacemaker Insertion CRT-P;  Surgeon: GEvans Lance MD;  [facility];  Service: Cardiovascular;  Laterality: N/A;  . ESOPHAGOGASTRODUODENOSCOPY N/A 11/05/2015   Procedure: ESOPHAGOGASTRODUODENOSCOPY (EGD);  Surgeon: RRonald Lobo MD;  [facility];  Service: Endoscopy;  Laterality: N/A;  . EYE SURGERY  2015   left cataract with lens implant  . LAPAROSCOPIC PARTIAL COLECTOMY N/A 11/10/2015   Procedure: LAPAROSCOPIC LEFT COLECTOMY LAPAROSCOPIC MOBILIZATION OF SPLENIC FLEXURE;  Surgeon: EGreer Pickerel MD;  [place] [facility]  Service: General;  Laterality: N/A;  . Nuclear stress  11/01/2011   Severe global hypokinesis,dilated ventricle  . PORTACATH PLACEMENT N/A 12/29/2015   Procedure: INSERTION PORT-A-CATH WITH ULTRASOUND GUIDANCE;  Surgeon: EGreer Pickerel MD;  [facility];  Service: General;  Laterality: N/A;  . TEE WITHOUT CARDIOVERSION  10/10/2011   Procedure: TRANSESOPHAGEAL ECHOCARDIOGRAM (TEE);  Surgeon: MSanda Klein MD;  [facility];  Service: Cardiovascular;  Laterality: N/A;    SOCIAL HISTORY: Social History   Social History  . Marital status: Married    Spouse name: N/A  . Number of children: N/A  . Years of education: N/A   Occupational History  . Not on file.   Social History Main Topics  . Smoking status: Never Smoker  . Smokeless tobacco: Never Used  . Alcohol use No  . Drug use:  No  . Sexual activity: Not Currently   Other Topics Concern  . Not on file   Social History Narrative  . No narrative on file    FAMILY HISTORY: Family History  Problem Relation Age of Onset  . Colon cancer Mother   . Kidney disease Father     ESRF/HD, deceased    ALLERGIES:  is allergic to amiodarone and allopurinol.  MEDICATIONS:    Current Outpatient Prescriptions  Medication Sig Dispense Refill  . b complex vitamins capsule Take 1 capsule by mouth daily. 30 capsule 3  . benazepril (LOTENSIN) 40 MG tablet TAKE 1 TABLET (40 MG TOTAL) BY MOUTH DAILY. 30 tablet 6  . carvedilol (COREG) 25 MG tablet TAKE A HALF TABLET (12.5 MG) EVERY MORNING AND A WHOLE TABLET (25 MG) EVERY EVENING 135 tablet 0  . chlorhexidine (PERIDEX) 0.12 % solution Use as directed 15 mLs in the mouth or throat 2 (two) times daily. 473 mL 0  . dexamethasone (DECADRON) 4 MG tablet Take 2 tablets (8 mg total) by mouth daily. Start the day after chemotherapy for 2 days. Take with food. 30 tablet 1  . doxazosin (CARDURA) 8 MG tablet TAKE 1 TABLET (8 MG TOTAL) BY MOUTH AT BEDTIME. 30 tablet 9  . ferrous sulfate 325 (65 FE) MG EC tablet Take 1 tablet (325 mg total) by mouth daily with breakfast. 30 tablet 0  . furosemide (LASIX) 40 MG tablet Take 1 tablet (40 mg total) by mouth every other day. 15 tablet 11  . glipiZIDE (GLUCOTROL XL) 5 MG 24 hr tablet Take 1 tablet (5 mg total) by mouth daily with breakfast. 30 tablet 1  . lidocaine-prilocaine (EMLA) cream Apply to affected area once 30 g 3  . ondansetron (ZOFRAN) 8 MG tablet Take 1 tablet (8 mg total) by mouth 2 (two) times daily as needed for refractory nausea / vomiting. Start on day 3 after chemotherapy. 30 tablet 1  . potassium chloride (KLOR-CON M10) 10 MEQ tablet Take 1 tablet (10 mEq total) by mouth every other day. 15 tablet 11  . prochlorperazine (COMPAZINE) 10 MG tablet Take 1 tablet (10 mg total) by mouth every 6 (six) hours as needed (Nausea or vomiting). 30 tablet 1  . rosuvastatin (CRESTOR) 20 MG tablet TAKE 1 TABLET (20 MG TOTAL) BY MOUTH AT BEDTIME. 30 tablet 10  . sitaGLIPtin (JANUVIA) 100 MG tablet Take 100 mg by mouth daily.    Alveda Reasons 20 MG TABS tablet TAKE 1 TABLET BY MOUTH DAILY 30 tablet 5   No current facility-administered medications for this visit.     REVIEW OF SYSTEMS:    10  Point review of Systems was done is negative except as noted above.  PHYSICAL EXAMINATION: ECOG PERFORMANCE STATUS: 2 - Symptomatic, <50% confined to bed  . Vitals:   03/19/16 1615  BP: (!) 126/58  Pulse: 86  Resp: 18  Temp: 98.1 F (36.7 C)   Filed Weights   03/19/16 1615  Weight: 227 lb 9.6 oz (103.2 kg)   .Body mass index is 28.45 kg/m.   . Wt Readings from Last 3 Encounters:  03/22/16 218 lb 11.2 oz (99.2 kg)  03/19/16 227 lb 9.6 oz (103.2 kg)  03/11/16 221 lb 6.4 oz (100.4 kg)   GENERAL:alert, in no acute distress and comfortable SKIN: skin color, texture, turgor are normal, no rashes or significant lesions EYES: normal, conjunctiva are pink and non-injected, sclera clear OROPHARYNX:no exudate, no erythema and lips, buccal mucosa, and tongue  normal  NECK: supple, no JVD, thyroid normal size, non-tender, without nodularity LYMPH:  no palpable lymphadenopathy in the cervical, axillary or inguinal LUNGS: clear to auscultation with normal respiratory effort HEART: regular rate & rhythm,  no murmurs and no lower extremity edema ABDOMEN: Abdominal surgical incision has healed with minimal superficial crusting remaining . Musculoskeletal: no cyanosis of digits and no clubbing  PSYCH: alert & oriented x 3 with fluent speech NEURO: no focal motor/sensory deficits  LABORATORY DATA:  I have reviewed the data as listed  . CBC Latest Ref Rng & Units 03/19/2016 03/05/2016 02/22/2016  WBC 4.0 - 10.3 10e3/uL 3.3(L) 3.4(L) 3.1(L)  Hemoglobin 13.0 - 17.1 g/dL 9.8(L) 9.7(L) 10.4(L)  Hematocrit 38.4 - 49.9 % 29.1(L) 28.6(L) 31.0(L)  Platelets 140 - 400 10e3/uL 118(L) 119(L) 97(L)   . CBC    Component Value Date/Time   WBC 3.3 (L) 03/19/2016 1604   WBC 5.8 12/25/2015 0840   RBC 3.73 (L) 03/19/2016 1604   RBC 4.40 12/25/2015 0840   HGB 9.8 (L) 03/19/2016 1604   HCT 29.1 (L) 03/19/2016 1604   PLT 118 (L) 03/19/2016 1604   MCV 78.0 (L) 03/19/2016 1604   MCH 26.3 (L) 03/19/2016  1604   MCH 23.9 (L) 12/25/2015 0840   MCHC 33.7 03/19/2016 1604   MCHC 32.1 12/25/2015 0840   RDW 18.2 (H) 03/19/2016 1604   LYMPHSABS 0.9 03/19/2016 1604   MONOABS 0.5 03/19/2016 1604   EOSABS 0.0 03/19/2016 1604   BASOSABS 0.0 03/19/2016 1604    . CMP Latest Ref Rng & Units 03/19/2016 03/05/2016 02/22/2016  Glucose 70 - 140 mg/dl 333(H) 261(H) 332(H)  BUN 7.0 - 26.0 mg/dL 10.0 14.8 20.7  Creatinine 0.7 - 1.3 mg/dL 1.8(H) 1.8(H) 2.3(H)  Sodium 136 - 145 mEq/L 139 138 138  Potassium 3.5 - 5.1 mEq/L 3.9 4.0 4.1  Chloride 101 - 111 mmol/L - - -  CO2 22 - 29 mEq/L 25 25 26   Calcium 8.4 - 10.4 mg/dL 9.7 10.1 9.9  Total Protein 6.4 - 8.3 g/dL 6.8 6.6 6.7  Total Bilirubin 0.20 - 1.20 mg/dL 1.11 1.05 0.94  Alkaline Phos 40 - 150 U/L 88 76 88  AST 5 - 34 U/L 16 18 21   ALT 0 - 55 U/L 12 15 21      RADIOGRAPHIC STUDIES: I have personally reviewed the radiological images as listed and agreed with the findings in the report. No results found.  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 Patient is s/p left sided colectomy on 11/10/2015 . Surgical incision Has completely healed up.  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 Patient received 8 units of PRBC and IV iron in the hospital unusual presentation. Hgb improving. B12 was borderline low- improved now. Post-operative CEA WNL 3) HTN - controlled.  4) DM2 - uncontrolled in the setting of chemotherapy and steroid use. Somewhat better with GLipizide per patient. 5) Non ischemic chronic diastolic CHF 6) P. a fib on Xarelto per cardiology - will need to be mindful of his renal function. 7) CKD 3 with some increase in the creatinine over baseline. Now stable at baseline with a creatinine of 1.8. Patient is also on ACE inhibitor as per primary care physician and diuretics which will need to be monitored closely. 8) Duodenal ulceration (h pylori  -positive) - completed Abx for rx. On PPI 9) thrombocytopenia likely related to chemotherapy. Reasonable at  118k PLAN --Patient's labs and stable. Creatinine stable. No other overt new toxicities.  ANC within normal limits. Platelets improved. --continue Glipizide ER and Januvia. --Patient needs close follow-up with his primary care physician to optimize his diabetes treatment due to uncontrolled hyperglycemia which is putting additional strain on his kidneys. --Also he is on multiple antihypertensives including Lasix and and ace inhibitors which puts him at high risk for fluctuations and renal function based on volume status and ACE inhibitor. This will need to be closely monitored with his primary care physician as well. -continue po replacement of B12/Bcomplex and continue po iron. -continue to maintain ambulation and good po intake -continue f/u with PCP for management of other medical problems.  Return to care in 2 week with Dr. Irene Limbo with labs prior to C7 of FOLFOX.  I spent 20 minutes counseling the patient face to face. The total time spent in the appointment was 25 minutes and more than 50% was on counseling and direct patient cares.    Sullivan Lone MD [place] AAHIVMS [facility] Hematology/Oncology Physician [facility]  (Office):       [phone] (Work cell):  [phone] (Fax):           [phone]

## 2016-03-27 ENCOUNTER — Ambulatory Visit: Payer: Medicare Other

## 2016-03-29 ENCOUNTER — Encounter: Payer: Self-pay | Admitting: Cardiology

## 2016-04-01 ENCOUNTER — Telehealth: Payer: Self-pay | Admitting: Hematology

## 2016-04-01 NOTE — Telephone Encounter (Signed)
Spoke with patient re appointments for 10/24 and 10/25. Patient to get new schedule 10/24.

## 2016-04-02 ENCOUNTER — Other Ambulatory Visit (HOSPITAL_BASED_OUTPATIENT_CLINIC_OR_DEPARTMENT_OTHER): Payer: Medicare Other

## 2016-04-02 ENCOUNTER — Encounter: Payer: Self-pay | Admitting: Hematology

## 2016-04-02 ENCOUNTER — Ambulatory Visit (HOSPITAL_BASED_OUTPATIENT_CLINIC_OR_DEPARTMENT_OTHER): Payer: Medicare Other | Admitting: Hematology

## 2016-04-02 ENCOUNTER — Ambulatory Visit: Payer: Medicare Other

## 2016-04-02 VITALS — BP 147/60 | HR 91 | Temp 97.8°F | Resp 18 | Ht 75.0 in | Wt 219.6 lb

## 2016-04-02 DIAGNOSIS — D500 Iron deficiency anemia secondary to blood loss (chronic): Secondary | ICD-10-CM

## 2016-04-02 DIAGNOSIS — C184 Malignant neoplasm of transverse colon: Secondary | ICD-10-CM

## 2016-04-02 DIAGNOSIS — D509 Iron deficiency anemia, unspecified: Secondary | ICD-10-CM

## 2016-04-02 DIAGNOSIS — Z95828 Presence of other vascular implants and grafts: Secondary | ICD-10-CM

## 2016-04-02 DIAGNOSIS — E538 Deficiency of other specified B group vitamins: Secondary | ICD-10-CM

## 2016-04-02 LAB — COMPREHENSIVE METABOLIC PANEL
ALBUMIN: 2.6 g/dL — AB (ref 3.5–5.0)
ALK PHOS: 85 U/L (ref 40–150)
ALT: 14 U/L (ref 0–55)
ANION GAP: 9 meq/L (ref 3–11)
AST: 15 U/L (ref 5–34)
BUN: 17.7 mg/dL (ref 7.0–26.0)
CALCIUM: 10.1 mg/dL (ref 8.4–10.4)
CO2: 26 mEq/L (ref 22–29)
Chloride: 102 mEq/L (ref 98–109)
Creatinine: 2 mg/dL — ABNORMAL HIGH (ref 0.7–1.3)
EGFR: 37 mL/min/{1.73_m2} — ABNORMAL LOW (ref >90)
Glucose: 336 mg/dl — ABNORMAL HIGH (ref 70–140)
POTASSIUM: 3.8 meq/L (ref 3.5–5.1)
Sodium: 138 mEq/L (ref 136–145)
Total Bilirubin: 1.36 mg/dL — ABNORMAL HIGH (ref 0.20–1.20)
Total Protein: 7 g/dL (ref 6.4–8.3)

## 2016-04-02 LAB — CBC & DIFF AND RETIC
BASO%: 0.8 % (ref 0.0–2.0)
BASOS ABS: 0 10*3/uL (ref 0.0–0.1)
EOS ABS: 0 10*3/uL (ref 0.0–0.5)
EOS%: 1.2 % (ref 0.0–7.0)
HEMATOCRIT: 27.5 % — AB (ref 38.4–49.9)
HEMOGLOBIN: 9.4 g/dL — AB (ref 13.0–17.1)
Immature Retic Fract: 17.6 % — ABNORMAL HIGH (ref 3.00–10.60)
LYMPH%: 32.9 % (ref 14.0–49.0)
MCH: 26.1 pg — AB (ref 27.2–33.4)
MCHC: 34.2 g/dL (ref 32.0–36.0)
MCV: 76.4 fL — AB (ref 79.3–98.0)
MONO#: 0.5 10*3/uL (ref 0.1–0.9)
MONO%: 20.7 % — AB (ref 0.0–14.0)
NEUT#: 1.1 10*3/uL — ABNORMAL LOW (ref 1.5–6.5)
NEUT%: 44.4 % (ref 39.0–75.0)
PLATELETS: 159 10*3/uL (ref 140–400)
RBC: 3.6 10*6/uL — ABNORMAL LOW (ref 4.20–5.82)
RDW: 17.3 % — ABNORMAL HIGH (ref 11.0–14.6)
RETIC %: 2.52 % — AB (ref 0.80–1.80)
Retic Ct Abs: 90.72 10*3/uL (ref 34.80–93.90)
WBC: 2.5 10*3/uL — ABNORMAL LOW (ref 4.0–10.3)
lymph#: 0.8 10*3/uL — ABNORMAL LOW (ref 0.9–3.3)

## 2016-04-02 MED ORDER — SODIUM CHLORIDE 0.9 % IJ SOLN
10.0000 mL | INTRAMUSCULAR | Status: DC | PRN
  Filled 2016-04-02: qty 10

## 2016-04-02 MED ORDER — HEPARIN SOD (PORK) LOCK FLUSH 100 UNIT/ML IV SOLN
500.0000 [IU] | Freq: Once | INTRAVENOUS | Status: DC | PRN
  Filled 2016-04-02: qty 5

## 2016-04-03 ENCOUNTER — Ambulatory Visit: Payer: Medicare Other

## 2016-04-03 ENCOUNTER — Other Ambulatory Visit: Payer: Medicare Other

## 2016-04-05 ENCOUNTER — Telehealth: Payer: Self-pay | Admitting: Hematology

## 2016-04-05 NOTE — Telephone Encounter (Signed)
Returned patient call and left message re next appointments for 11/2.appointments scheduled per 10/24 los.

## 2016-04-06 ENCOUNTER — Other Ambulatory Visit: Payer: Self-pay | Admitting: Hematology

## 2016-04-09 ENCOUNTER — Ambulatory Visit (HOSPITAL_BASED_OUTPATIENT_CLINIC_OR_DEPARTMENT_OTHER): Payer: Medicare Other | Admitting: Hematology

## 2016-04-09 ENCOUNTER — Other Ambulatory Visit (HOSPITAL_BASED_OUTPATIENT_CLINIC_OR_DEPARTMENT_OTHER): Payer: Medicare Other

## 2016-04-09 ENCOUNTER — Observation Stay (HOSPITAL_COMMUNITY)
Admission: AD | Admit: 2016-04-09 | Discharge: 2016-04-10 | Disposition: A | Discharge status: Home / Self Care | Payer: Medicare Other | Source: Ambulatory Visit | Attending: Family Medicine | Admitting: Family Medicine

## 2016-04-09 ENCOUNTER — Encounter: Payer: Self-pay | Admitting: Hematology

## 2016-04-09 ENCOUNTER — Other Ambulatory Visit: Payer: Self-pay | Admitting: *Deleted

## 2016-04-09 ENCOUNTER — Encounter (HOSPITAL_COMMUNITY): Payer: Self-pay | Admitting: *Deleted

## 2016-04-09 VITALS — BP 104/46 | HR 89 | Temp 98.1°F | Resp 16 | Ht 75.0 in | Wt 221.3 lb

## 2016-04-09 DIAGNOSIS — N183 Chronic kidney disease, stage 3 (moderate): Secondary | ICD-10-CM | POA: Diagnosis not present

## 2016-04-09 DIAGNOSIS — D649 Anemia, unspecified: Secondary | ICD-10-CM | POA: Insufficient documentation

## 2016-04-09 DIAGNOSIS — Z7901 Long term (current) use of anticoagulants: Secondary | ICD-10-CM | POA: Diagnosis not present

## 2016-04-09 DIAGNOSIS — C189 Malignant neoplasm of colon, unspecified: Secondary | ICD-10-CM | POA: Diagnosis not present

## 2016-04-09 DIAGNOSIS — Z79899 Other long term (current) drug therapy: Secondary | ICD-10-CM | POA: Diagnosis not present

## 2016-04-09 DIAGNOSIS — C184 Malignant neoplasm of transverse colon: Secondary | ICD-10-CM | POA: Diagnosis not present

## 2016-04-09 DIAGNOSIS — I130 Hypertensive heart and chronic kidney disease with heart failure and stage 1 through stage 4 chronic kidney disease, or unspecified chronic kidney disease: Principal | ICD-10-CM | POA: Insufficient documentation

## 2016-04-09 DIAGNOSIS — E860 Dehydration: Secondary | ICD-10-CM | POA: Insufficient documentation

## 2016-04-09 DIAGNOSIS — IMO0002 Reserved for concepts with insufficient information to code with codable children: Secondary | ICD-10-CM | POA: Diagnosis present

## 2016-04-09 DIAGNOSIS — R627 Adult failure to thrive: Secondary | ICD-10-CM | POA: Diagnosis not present

## 2016-04-09 DIAGNOSIS — E118 Type 2 diabetes mellitus with unspecified complications: Secondary | ICD-10-CM

## 2016-04-09 DIAGNOSIS — I5032 Chronic diastolic (congestive) heart failure: Secondary | ICD-10-CM | POA: Insufficient documentation

## 2016-04-09 DIAGNOSIS — Z7984 Long term (current) use of oral hypoglycemic drugs: Secondary | ICD-10-CM | POA: Diagnosis not present

## 2016-04-09 DIAGNOSIS — I10 Essential (primary) hypertension: Secondary | ICD-10-CM | POA: Diagnosis present

## 2016-04-09 DIAGNOSIS — R531 Weakness: Secondary | ICD-10-CM | POA: Diagnosis present

## 2016-04-09 DIAGNOSIS — E1122 Type 2 diabetes mellitus with diabetic chronic kidney disease: Secondary | ICD-10-CM | POA: Diagnosis not present

## 2016-04-09 DIAGNOSIS — E1165 Type 2 diabetes mellitus with hyperglycemia: Secondary | ICD-10-CM | POA: Diagnosis present

## 2016-04-09 DIAGNOSIS — N289 Disorder of kidney and ureter, unspecified: Secondary | ICD-10-CM

## 2016-04-09 LAB — CBC WITH DIFFERENTIAL/PLATELET
BASO%: 0.4 % (ref 0.0–2.0)
BASOS ABS: 0 10*3/uL (ref 0.0–0.1)
EOS%: 0.7 % (ref 0.0–7.0)
Eosinophils Absolute: 0.1 10*3/uL (ref 0.0–0.5)
HCT: 25.5 % — ABNORMAL LOW (ref 38.4–49.9)
HEMOGLOBIN: 8.4 g/dL — AB (ref 13.0–17.1)
LYMPH%: 19.7 % (ref 14.0–49.0)
MCH: 24.9 pg — AB (ref 27.2–33.4)
MCHC: 32.9 g/dL (ref 32.0–36.0)
MCV: 75.4 fL — ABNORMAL LOW (ref 79.3–98.0)
MONO#: 0.8 10*3/uL (ref 0.1–0.9)
MONO%: 11 % (ref 0.0–14.0)
NEUT#: 4.9 10*3/uL (ref 1.5–6.5)
NEUT%: 68.2 % (ref 39.0–75.0)
Platelets: 291 10*3/uL (ref 140–400)
RBC: 3.38 10*6/uL — AB (ref 4.20–5.82)
RDW: 17.6 % — ABNORMAL HIGH (ref 11.0–14.6)
WBC: 7.2 10*3/uL (ref 4.0–10.3)
lymph#: 1.4 10*3/uL (ref 0.9–3.3)
nRBC: 1 % — ABNORMAL HIGH (ref 0–0)

## 2016-04-09 LAB — COMPREHENSIVE METABOLIC PANEL
ALBUMIN: 2.2 g/dL — AB (ref 3.5–5.0)
ALK PHOS: 120 U/L (ref 40–150)
ALT: 69 U/L — ABNORMAL HIGH (ref 0–55)
AST: 90 U/L — ABNORMAL HIGH (ref 5–34)
Anion Gap: 14 mEq/L — ABNORMAL HIGH (ref 3–11)
BUN: 26.3 mg/dL — ABNORMAL HIGH (ref 7.0–26.0)
CHLORIDE: 102 meq/L (ref 98–109)
CO2: 23 mEq/L (ref 22–29)
Calcium: 10.9 mg/dL — ABNORMAL HIGH (ref 8.4–10.4)
Creatinine: 2.1 mg/dL — ABNORMAL HIGH (ref 0.7–1.3)
EGFR: 35 mL/min/{1.73_m2} — AB (ref >90)
GLUCOSE: 189 mg/dL — AB (ref 70–140)
POTASSIUM: 3.8 meq/L (ref 3.5–5.1)
SODIUM: 138 meq/L (ref 136–145)
Total Bilirubin: 1.33 mg/dL — ABNORMAL HIGH (ref 0.20–1.20)
Total Protein: 7.3 g/dL (ref 6.4–8.3)

## 2016-04-09 LAB — GLUCOSE, CAPILLARY
Glucose-Capillary: 162 mg/dL — ABNORMAL HIGH (ref 65–99)
Glucose-Capillary: 309 mg/dL — ABNORMAL HIGH (ref 65–99)

## 2016-04-09 LAB — ABO/RH: ABO/RH(D): O POS

## 2016-04-09 LAB — PREPARE RBC (CROSSMATCH)

## 2016-04-09 MED ORDER — GLIPIZIDE ER 5 MG PO TB24
5.0000 mg | ORAL_TABLET | Freq: Every day | ORAL | Status: DC
  Administered 2016-04-10: 5 mg via ORAL
  Filled 2016-04-09: qty 1

## 2016-04-09 MED ORDER — LINAGLIPTIN 5 MG PO TABS
5.0000 mg | ORAL_TABLET | Freq: Every day | ORAL | Status: DC
Start: 2016-04-10 — End: 2016-04-10
  Administered 2016-04-10: 5 mg via ORAL
  Filled 2016-04-09: qty 1

## 2016-04-09 MED ORDER — SODIUM CHLORIDE 0.9 % IV SOLN
INTRAVENOUS | Status: DC
  Administered 2016-04-09: 20:00:00 via INTRAVENOUS

## 2016-04-09 MED ORDER — CARVEDILOL 12.5 MG PO TABS
12.5000 mg | ORAL_TABLET | Freq: Two times a day (BID) | ORAL | Status: DC
Start: 2016-04-10 — End: 2016-04-10
  Administered 2016-04-10: 12.5 mg via ORAL
  Filled 2016-04-09: qty 1

## 2016-04-09 MED ORDER — DOXAZOSIN MESYLATE 8 MG PO TABS
8.0000 mg | ORAL_TABLET | Freq: Every day | ORAL | Status: DC
  Filled 2016-04-09 (×2): qty 1

## 2016-04-09 MED ORDER — SODIUM CHLORIDE 0.9 % IV SOLN
Freq: Once | INTRAVENOUS | Status: AC
  Administered 2016-04-09: 21:00:00 via INTRAVENOUS

## 2016-04-09 MED ORDER — FERROUS SULFATE 325 (65 FE) MG PO TABS
325.0000 mg | ORAL_TABLET | Freq: Every day | ORAL | Status: DC
  Administered 2016-04-10: 325 mg via ORAL
  Filled 2016-04-09: qty 1

## 2016-04-09 MED ORDER — INSULIN ASPART 100 UNIT/ML ~~LOC~~ SOLN
0.0000 [IU] | Freq: Three times a day (TID) | SUBCUTANEOUS | Status: DC
  Administered 2016-04-10: 3 [IU] via SUBCUTANEOUS
  Administered 2016-04-10: 5 [IU] via SUBCUTANEOUS

## 2016-04-09 MED ORDER — ACETAMINOPHEN 650 MG RE SUPP: 650.0000 mg | Freq: Four times a day (QID) | RECTAL | Status: DC | PRN

## 2016-04-09 MED ORDER — INSULIN ASPART 100 UNIT/ML ~~LOC~~ SOLN
0.0000 [IU] | Freq: Every day | SUBCUTANEOUS | Status: DC
  Administered 2016-04-09: 4 [IU] via SUBCUTANEOUS

## 2016-04-09 MED ORDER — ACETAMINOPHEN 325 MG PO TABS: 650.0000 mg | ORAL_TABLET | Freq: Four times a day (QID) | ORAL | Status: DC | PRN

## 2016-04-09 NOTE — H&P (Addendum)
History and Physical    Ernest Lara ZGY:[id number] DOB: [birth year] DOA: 04/09/2016  PCP: [name], MD  Patient coming from: Home  Chief Complaint: Failure to thrive  HPI: Ernest Lara is a 75 y.o. male with medical history significant of DM, cardiomyopathy, afib on xarelto, CKD3, complete heart block, HTN, stage 3B colon cancer who presents from Oncology clinic reportedly with increased weakness and hgb down to 8.4 and signs of dehydration. Per report, pt has hx of poorly controlled glucose and recently noted to have hgb trend down from baseline of around 10. Oncology requests admission for blood transfusion and for possibly starting insulin.   On further questioning, patient denies syncope or near syncope. Also denies bloody or black stools. Patient also reports gradually feeling weak over the past one week associated with worsening chills. Chills began in August following initiating chemo. Also reports decreased appetite and dry mouth.  Review of Systems:  Review of Systems  Constitutional: Positive for malaise/fatigue. Negative for chills and fever.  HENT: Negative for ear discharge, ear pain and hearing loss.   Eyes: Negative for photophobia and pain.  Respiratory: Negative for shortness of breath and wheezing.   Cardiovascular: Negative for orthopnea and claudication.  Gastrointestinal: Negative for abdominal pain and constipation.  Genitourinary: Negative for frequency and urgency.  Musculoskeletal: Negative for back pain, falls and joint pain.  Neurological: Positive for weakness. Negative for tingling, tremors and loss of consciousness.  Psychiatric/Behavioral: Negative for hallucinations and memory loss. The patient does not have insomnia.     Past Medical History:  Diagnosis Date  . CKD (chronic kidney disease) stage 3, GFR 30-59 ml/min   . Complete heart block ([place])    s. 05/02/2015 s/p BSX U128 [place] CRT-P Beckie Salts).  Marland Kitchen GIB (gastrointestinal bleeding)     a. 10/2015- Hgb 4.6-->8u PRBC's;  b. 10/2015 EGD: non bleeding H pylori + ulceration; c. 10/2015 Colonoscopy: invasive adenocarcinoma.  . History of cardiomyopathy    a. Nonischemic, possibly tachycardia mediated;  b. 04/2015 Echo:  EF 60-65%, no rwma, mild AI/MR, mod dil LA/RA, PASP 71mHg.  .Marland KitchenHypertension   . Hypertensive heart disease   . OSA (obstructive sleep apnea), pt with apnea during procedures 10/11/2011   Denies  . Paroxysmal atrial fibrillation (HCC)    a. CHA2DS2VASc = 2-3 (Xarelto).  . Stage IIIB Colon Cancer    a. 11/2015 Colonscopy: invasive adenocarcinoma;  b. 11/2015 s/p lap L colectomy; c. Pathology: pT3, pN1c Mx, MSI stable.  . Type 2 diabetes mellitus ([place]     Past Surgical History:  Procedure Laterality Date  . CARDIAC CATHETERIZATION    . CARDIOVERSION  10/11/2011   Procedure: CARDIOVERSION;  Surgeon: DLeonie Man MD;  [place]  Service: Cardiovascular;  Laterality: N/A;  . COLONOSCOPY N/A 11/05/2015   Procedure: COLONOSCOPY;  Surgeon: RRonald Lobo MD;  [facility];  Service: Endoscopy;  Laterality: N/A;  . EP IMPLANTABLE DEVICE N/A 05/02/2015   Procedure: BiV Pacemaker Insertion CRT-P;  Surgeon: GEvans Lance MD;  [facility];  Service: Cardiovascular;  Laterality: N/A;  . ESOPHAGOGASTRODUODENOSCOPY N/A 11/05/2015   Procedure: ESOPHAGOGASTRODUODENOSCOPY (EGD);  Surgeon: RRonald Lobo MD;  [facility];  Service: Endoscopy;  Laterality: N/A;  . EYE SURGERY  2015   left cataract with lens implant  . LAPAROSCOPIC PARTIAL COLECTOMY N/A 11/10/2015   Procedure: LAPAROSCOPIC LEFT COLECTOMY LAPAROSCOPIC MOBILIZATION OF SPLENIC FLEXURE;  Surgeon: EGreer Pickerel MD;  [place]  Service: General;  Laterality: N/A;  . Nuclear stress  11/01/2011   Severe global hypokinesis,dilated ventricle  . PORTACATH PLACEMENT N/A 12/29/2015   Procedure: INSERTION PORT-A-CATH WITH ULTRASOUND GUIDANCE;  Surgeon: Greer Pickerel, MD;   Location[facility];  Service: General;  Laterality: N/A;  . TEE WITHOUT CARDIOVERSION  10/10/2011   Procedure: TRANSESOPHAGEAL ECHOCARDIOGRAM (TEE);  Surgeon: Sanda Klein, MD;  Location[facility];  Service: Cardiovascular;  Laterality: N/A;     reports that he has never smoked. He has never used smokeless tobacco. He reports that he does not drink alcohol or use drugs.  Allergies  Allergen Reactions  . Amiodarone Shortness Of Breath    PULMONARY TOXICITY with AMIODARONE  . Allopurinol     unknown    Family History  Problem Relation Age of Onset  . Colon cancer Mother   . Kidney disease Father     ESRF/HD, deceased    Prior to Admission medications   Medication Sig Start Date End Date Taking? Authorizing Provider  b complex vitamins capsule Take 1 capsule by mouth daily. 11/29/15   Brunetta Genera, MD  benazepril (LOTENSIN) 40 MG tablet TAKE 1 TABLET (40 MG TOTAL) BY MOUTH DAILY. 11/22/15   [name], MD  carvedilol (COREG) 25 MG tablet TAKE A HALF TABLET (12.5 MG) EVERY MORNING AND A WHOLE TABLET (25 MG) EVERY EVENING 08/25/15   [name], MD  chlorhexidine (PERIDEX) 0.12 % solution Use as directed 15 mLs in the mouth or throat 2 (two) times daily. 02/22/16   Brunetta Genera, MD  dexamethasone (DECADRON) 4 MG tablet Take 2 tablets (8 mg total) by mouth daily. Start the day after chemotherapy for 2 days. Take with food. 01/10/16   Brunetta Genera, MD  doxazosin (CARDURA) 8 MG tablet TAKE 1 TABLET (8 MG TOTAL) BY MOUTH AT BEDTIME. 12/22/15   [name], MD  ferrous sulfate 325 (65 FE) MG EC tablet Take 1 tablet (325 mg total) by mouth daily with breakfast. 11/15/15   Clanford Marisa Hua, MD  furosemide (LASIX) 40 MG tablet Take 1 tablet (40 mg total) by mouth every other day. 03/11/16   [name], MD  glipiZIDE (GLUCOTROL XL) 5 MG 24 hr tablet Take 1 tablet (5 mg total) by mouth daily with breakfast. 03/19/16   Brunetta Genera, MD  lidocaine-prilocaine (EMLA)  cream Apply to affected area once 01/10/16   Brunetta Genera, MD  ondansetron (ZOFRAN) 8 MG tablet Take 1 tablet (8 mg total) by mouth 2 (two) times daily as needed for refractory nausea / vomiting. Start on day 3 after chemotherapy. 01/10/16   Brunetta Genera, MD  potassium chloride (KLOR-CON M10) 10 MEQ tablet Take 1 tablet (10 mEq total) by mouth every other day. 03/11/16   [name], MD  prochlorperazine (COMPAZINE) 10 MG tablet Take 1 tablet (10 mg total) by mouth every 6 (six) hours as needed (Nausea or vomiting). 01/10/16   Brunetta Genera, MD  sitaGLIPtin (JANUVIA) 100 MG tablet Take 100 mg by mouth daily.    Historical Provider, MD  XARELTO 20 MG TABS tablet TAKE 1 TABLET BY MOUTH DAILY 11/22/15   Sanda Klein, MD    Physical Exam: Vitals:   04/09/16 1736  BP: 97/62  Pulse: 99  Resp: 18  Temp: 98.4 F (36.9 C)  TempSrc: Oral  SpO2: 100%  Height: 6' 3"  (1.905 m)    Constitutional: NAD, calm, comfortable Vitals:   04/09/16 1736  BP: 97/62  Pulse: 99  Resp:  18  Temp: 98.4 F (36.9 C)  TempSrc: Oral  SpO2: 100%  Height: 6' 3"  (1.905 m)   Eyes: PERRL, lids and conjunctivae normal ENMT: Mucous membranes are dry. Posterior pharynx clear of any exudate or lesions.Normal dentition.  Neck: normal, supple, no masses, no thyromegaly Respiratory: clear to auscultation bilaterally, no wheezing, no crackles. Normal respiratory effort. No accessory muscle use.  Cardiovascular: Regular rate and rhythm, no murmurs / rubs / gallops. No extremity edema. 2+ pedal pulses. No carotid bruits.  Abdomen: no tenderness, no masses palpated. No hepatosplenomegaly. Bowel sounds positive.  Musculoskeletal: no clubbing / cyanosis. No joint deformity upper and lower extremities. Good ROM, no contractures. Normal muscle tone.  Skin: no rashes, lesions, ulcers. Increased pallor Neurologic: CN 2-12 grossly intact. Sensation intact, DTR normal. Strength 5/5 in all 4.  Psychiatric: Normal  judgment and insight. Alert and oriented x 3. Normal mood.    Labs on Admission: I have personally reviewed following labs and imaging studies  CBC:  Recent Labs Lab 04/09/16 1521  WBC 7.2  NEUTROABS 4.9  HGB 8.4*  HCT 25.5*  MCV 75.4*  PLT 096   Basic Metabolic Panel:  Recent Labs Lab 04/09/16 1521  NA 138  K 3.8  CO2 23  GLUCOSE 189*  BUN 26.3*  CREATININE 2.1*  CALCIUM 10.9*   GFR: Estimated Creatinine Clearance: 36.3 mL/min (by C-G formula based on SCr of 2.1 mg/dL (H)). Liver Function Tests:  Recent Labs Lab 04/09/16 1521  AST 90*  ALT 69*  ALKPHOS 120  BILITOT 1.33*  PROT 7.3  ALBUMIN 2.2*   No results for input(s): LIPASE, AMYLASE in the last 168 hours. No results for input(s): AMMONIA in the last 168 hours. Coagulation Profile: No results for input(s): INR, PROTIME in the last 168 hours. Cardiac Enzymes: No results for input(s): CKTOTAL, CKMB, CKMBINDEX, TROPONINI in the last 168 hours. BNP (last 3 results) No results for input(s): PROBNP in the last 8760 hours. HbA1C: No results for input(s): HGBA1C in the last 72 hours. CBG: No results for input(s): GLUCAP in the last 168 hours. Lipid Profile: No results for input(s): CHOL, HDL, LDLCALC, TRIG, CHOLHDL, LDLDIRECT in the last 72 hours. Thyroid Function Tests: No results for input(s): TSH, T4TOTAL, FREET4, T3FREE, THYROIDAB in the last 72 hours. Anemia Panel: No results for input(s): VITAMINB12, FOLATE, FERRITIN, TIBC, IRON, RETICCTPCT in the last 72 hours. Urine analysis:    Component Value Date/Time   COLORURINE YELLOW 11/12/2015 1153   APPEARANCEUR HAZY (A) 11/12/2015 1153   LABSPEC 1.013 11/12/2015 1153   PHURINE 5.0 11/12/2015 1153   GLUCOSEU NEGATIVE 11/12/2015 1153   HGBUR LARGE (A) 11/12/2015 1153   BILIRUBINUR NEGATIVE 11/12/2015 1153   KETONESUR NEGATIVE 11/12/2015 1153   PROTEINUR NEGATIVE 11/12/2015 1153   NITRITE NEGATIVE 11/12/2015 1153   LEUKOCYTESUR NEGATIVE 11/12/2015  1153   Sepsis Labs: !!!!!!!!!!!!!!!!!!!!!!!!!!!!!!!!!!!!!!!!!!!! @LABRCNTIP (procalcitonin:4,lacticidven:4) )No results found for this or any previous visit (from the past 240 hour(s)).   Radiological Exams on Admission: No results found.  EKG: Independently reviewed. 03/11/16 echo reviewed. Paced rhythm  Assessment/Plan Active Problems:   HTN (hypertension)   Anemia   Chronic diastolic CHF (congestive heart failure) ([place])   Uncontrolled type 2 diabetes mellitus with complication (HCC)   Stage IIIB Colon Cancer   Long term current use of anticoagulant therapy   Renal insufficiency   1. DM2 1. Labs reviewed, including from prior to admission 2. Glucose in the 200-300's. Currently in the 180's 3. Will check a1c. Reviewed -  most recent from 5/31, which was 7.3 4. Cont home meds for now 5. Will start SSI 6. Consult diabetic coordinator 2. Anemia 1. Will check stool for blood 2. Hold xarelto for now 3. If stools neg for blood, then consider resuming anticoagulation 4. If stools pos for blood or if frank bleeding, then consider GI consultation at that time 5. Repeat CBC in AM 6. Give 2 units of prbc's per Hematology recommendations 3. Chronic diastolic chf 1. Most recent 2d echo reviewed. Normal LVEF 2. For now, hold off on lasix given concerns of dehydration 4. stage3b colon cancer 1. Followed by Dr. Irene Limbo 2. Will defer mgt to Oncology service 5. Dehydration 1. Will give 2 units prbc's per above 2. Repeat bmet in AM 6. HTN 1. SBP reviewed, currently under 100 2. Will cont only coreg with hold parameters, hold other BP meds until BP is higher  DVT prophylaxis: SCD's for now  Code Status: Full Family Communication: Pt in room  Disposition Plan: Uncertain at this time  Consults called:  Admission status: Obs   Amenda Duclos, Orpah Melter MD [facility] Pager [phone]  If 7PM-7AM, please contact night-coverage [email or web address] Password [email or web address]  04/09/2016, 5:58 PM

## 2016-04-10 ENCOUNTER — Ambulatory Visit: Payer: Medicare Other

## 2016-04-10 DIAGNOSIS — I10 Essential (primary) hypertension: Secondary | ICD-10-CM

## 2016-04-10 DIAGNOSIS — N289 Disorder of kidney and ureter, unspecified: Secondary | ICD-10-CM | POA: Diagnosis not present

## 2016-04-10 DIAGNOSIS — E118 Type 2 diabetes mellitus with unspecified complications: Secondary | ICD-10-CM

## 2016-04-10 DIAGNOSIS — C184 Malignant neoplasm of transverse colon: Secondary | ICD-10-CM

## 2016-04-10 DIAGNOSIS — E1165 Type 2 diabetes mellitus with hyperglycemia: Secondary | ICD-10-CM

## 2016-04-10 DIAGNOSIS — I130 Hypertensive heart and chronic kidney disease with heart failure and stage 1 through stage 4 chronic kidney disease, or unspecified chronic kidney disease: Secondary | ICD-10-CM | POA: Diagnosis not present

## 2016-04-10 DIAGNOSIS — Z7901 Long term (current) use of anticoagulants: Secondary | ICD-10-CM | POA: Diagnosis not present

## 2016-04-10 LAB — TYPE AND SCREEN
ABO/RH(D): O POS
ANTIBODY SCREEN: NEGATIVE
UNIT DIVISION: 0
UNIT DIVISION: 0

## 2016-04-10 LAB — GLUCOSE, CAPILLARY
GLUCOSE-CAPILLARY: 209 mg/dL — AB (ref 65–99)
GLUCOSE-CAPILLARY: 139 mg/dL — AB (ref 65–99)
Glucose-Capillary: 190 mg/dL — ABNORMAL HIGH (ref 65–99)

## 2016-04-10 LAB — COMPREHENSIVE METABOLIC PANEL
ALBUMIN: 2.4 g/dL — AB (ref 3.5–5.0)
ALK PHOS: 91 U/L (ref 38–126)
ALT: 59 U/L (ref 17–63)
ANION GAP: 8 (ref 5–15)
AST: 68 U/L — ABNORMAL HIGH (ref 15–41)
BUN: 29 mg/dL — ABNORMAL HIGH (ref 6–20)
CHLORIDE: 103 mmol/L (ref 101–111)
CO2: 25 mmol/L (ref 22–32)
Calcium: 10 mg/dL (ref 8.9–10.3)
Creatinine, Ser: 1.99 mg/dL — ABNORMAL HIGH (ref 0.61–1.24)
GFR calc Af Amer: 36 mL/min — ABNORMAL LOW (ref >60)
GFR calc non Af Amer: 31 mL/min — ABNORMAL LOW (ref >60)
GLUCOSE: 210 mg/dL — AB (ref 65–99)
POTASSIUM: 3.4 mmol/L — AB (ref 3.5–5.1)
SODIUM: 136 mmol/L (ref 135–145)
Total Bilirubin: 1.8 mg/dL — ABNORMAL HIGH (ref 0.3–1.2)
Total Protein: 7 g/dL (ref 6.5–8.1)

## 2016-04-10 LAB — CBC
HEMATOCRIT: 27.7 % — AB (ref 39.0–52.0)
HEMOGLOBIN: 9.5 g/dL — AB (ref 13.0–17.0)
MCH: 26 pg (ref 26.0–34.0)
MCHC: 34.3 g/dL (ref 30.0–36.0)
MCV: 75.7 fL — ABNORMAL LOW (ref 78.0–100.0)
Platelets: 298 10*3/uL (ref 150–400)
RBC: 3.66 MIL/uL — AB (ref 4.22–5.81)
RDW: 17.1 % — ABNORMAL HIGH (ref 11.5–15.5)
WBC: 7.6 10*3/uL (ref 4.0–10.5)

## 2016-04-10 LAB — OCCULT BLOOD X 1 CARD TO LAB, STOOL: Fecal Occult Bld: NEGATIVE

## 2016-04-10 MED ORDER — HEPARIN SOD (PORK) LOCK FLUSH 100 UNIT/ML IV SOLN
500.0000 [IU] | INTRAVENOUS | Status: DC | PRN
  Filled 2016-04-10: qty 5

## 2016-04-10 MED ORDER — UNABLE TO FIND: 0 refills | Status: DC

## 2016-04-10 MED ORDER — POTASSIUM CHLORIDE CRYS ER 20 MEQ PO TBCR
40.0000 meq | EXTENDED_RELEASE_TABLET | Freq: Once | ORAL | Status: AC
  Administered 2016-04-10: 40 meq via ORAL
  Filled 2016-04-10: qty 2

## 2016-04-10 MED ORDER — CARVEDILOL 12.5 MG PO TABS: 12.5000 mg | ORAL_TABLET | Freq: Two times a day (BID) | ORAL | 2 refills | Status: DC

## 2016-04-10 NOTE — Discharge Summary (Signed)
Physician Discharge Summary  Ernest Lara [id number] DOB: May 24, 1941 DOA: 04/09/2016  PCP: Ernest Spurling, MD  Admit date: 04/09/2016 Discharge date: 04/10/2016  Time spent: 25* minutes  Recommendations for Outpatient Follow-up:  1. Follow up PCP in 2 weeks   Discharge Diagnoses:  Active Problems:   HTN (hypertension)   Anemia   Chronic diastolic CHF (congestive heart failure) ([place])   Uncontrolled type 2 diabetes mellitus with complication (HCC)   Stage IIIB Colon Cancer   Long term current use of anticoagulant therapy   Renal insufficiency   Dehydration   Discharge Condition: Stable  Diet recommendation: Low salt diet  Filed Weights   04/09/16 1736  Weight: 100.2 kg (221 lb)    History of present illness:  75 y.o. male with medical history significant of DM, cardiomyopathy, afib on xarelto, CKD3, complete heart block, HTN, stage 3B colon cancer who presents from Oncology clinic reportedly with increased weakness and hgb down to 8.4 and signs of dehydration. Per report, pt has hx of poorly controlled glucose and recently noted to have hgb trend down from baseline of around 10. Oncology requests admission for blood transfusion and for possibly starting insulin.   On further questioning, patient denies syncope or near syncope. Also denies bloody or black stools. Patient also reports gradually feeling weak over the past one week associated with worsening chills. Chills began in August following initiating chemo. Also reports decreased appetite and dry mouth.   Hospital Course:   Fatigue/weakness- patient says that the symptoms started after he was started on chemotherapy in August, patient is feeling better after getting 1 unit PRBC. Hemoglobin is stable at 9.5. Stool for occult blood is negative.  Anemia- likely anemia of chronic disease, stool for occult blood is negative. Hemoglobin is stable at 9.5.  Diabetes mellitus- patient takes glipizide 5 mg twice a day  along with Januvia at home. Was seen by diabetic cord noted, no insulin recommended at this time. Patient will go home on same medications. We'll send prescription for glucometer, lancets, glucose test strips.  Stage IIIB colon cancer- patient followed by oncology. Will follow oncologist as outpatient.  Hypertension- dose of Coreg was change in the hospital as patient systolic blood pressure was in 90s. Will discharge on low-dose of Coreg 12.5 mg twice a day  History of paroxysmal atrial fibrillation- patient currently on Xarelto. Will resume anticoagulation at discharge.  Hypokalemia- replaced potassium with kdur 40 meq po x1 before discharge.  CKD stage 3- Creatinine is at baseline.     Procedures:  None  Consultations:  None  Discharge Exam: Vitals:   04/10/16 0003 04/10/16 0215  BP: 110/61 110/62  Pulse: 80 85  Resp: 16 16  Temp: 98.5 F (36.9 C) 98.8 F (37.1 C)    General: Appears in no acute distress Cardiovascular: RRR, no murmurs Respiratory: Clear to auscultation bilaterally  Discharge Instructions   Discharge Instructions    Diet - low sodium heart healthy    Complete by:  As directed    Increase activity slowly    Complete by:  As directed      Current Discharge Medication List    START taking these medications   Details  UNABLE TO FIND Glucometer - 1  Lancets - 100  Test strips - 100  Alcohol pads - 100 Qty: 1 each, Refills: 0      CONTINUE these medications which have NOT CHANGED   Details  b complex vitamins capsule Take 1 capsule by mouth daily.  Qty: 30 capsule, Refills: 3    benazepril (LOTENSIN) 40 MG tablet TAKE 1 TABLET (40 MG TOTAL) BY MOUTH DAILY. Qty: 30 tablet, Refills: 6    carvedilol (COREG) 25 MG tablet TAKE A HALF TABLET (12.5 MG) EVERY MORNING AND A WHOLE TABLET (25 MG) EVERY EVENING Qty: 135 tablet, Refills: 0    doxazosin (CARDURA) 8 MG tablet TAKE 1 TABLET (8 MG TOTAL) BY MOUTH AT BEDTIME. Qty: 30 tablet,  Refills: 9    ferrous sulfate 325 (65 FE) MG EC tablet Take 1 tablet (325 mg total) by mouth daily with breakfast. Qty: 30 tablet, Refills: 0    furosemide (LASIX) 80 MG tablet Take 80 mg by mouth every other day.     glipiZIDE (GLUCOTROL) 5 MG tablet Take 5 mg by mouth 2 (two) times daily before a meal.     potassium chloride (KLOR-CON M10) 10 MEQ tablet Take 1 tablet (10 mEq total) by mouth every other day. Qty: 15 tablet, Refills: 11    rosuvastatin (CRESTOR) 20 MG tablet Take 20 mg by mouth every evening.     sitaGLIPtin (JANUVIA) 100 MG tablet Take 100 mg by mouth daily.    XARELTO 20 MG TABS tablet TAKE 1 TABLET BY MOUTH DAILY Qty: 30 tablet, Refills: 5    chlorhexidine (PERIDEX) 0.12 % solution Use as directed 15 mLs in the mouth or throat 2 (two) times daily. Qty: 473 mL, Refills: 0    dexamethasone (DECADRON) 4 MG tablet Take 2 tablets (8 mg total) by mouth daily. Start the day after chemotherapy for 2 days. Take with food. Qty: 30 tablet, Refills: 1   Associated Diagnoses: Malignant neoplasm of transverse colon (HCC)    lidocaine-prilocaine (EMLA) cream Apply to affected area once Qty: 30 g, Refills: 3   Associated Diagnoses: Malignant neoplasm of transverse colon (HCC)    ondansetron (ZOFRAN) 8 MG tablet Take 1 tablet (8 mg total) by mouth 2 (two) times daily as needed for refractory nausea / vomiting. Start on day 3 after chemotherapy. Qty: 30 tablet, Refills: 1   Associated Diagnoses: Malignant neoplasm of transverse colon (HCC)    prochlorperazine (COMPAZINE) 10 MG tablet Take 1 tablet (10 mg total) by mouth every 6 (six) hours as needed (Nausea or vomiting). Qty: 30 tablet, Refills: 1   Associated Diagnoses: Malignant neoplasm of transverse colon ([place])      STOP taking these medications     glipiZIDE (GLUCOTROL XL) 5 MG 24 hr tablet      metFORMIN (GLUCOPHAGE-XR) 750 MG 24 hr tablet        Allergies  Allergen Reactions  . Amiodarone Shortness Of Breath     PULMONARY TOXICITY with AMIODARONE  . Allopurinol     unknown      The results of significant diagnostics from this hospitalization (including imaging, microbiology, ancillary and laboratory) are listed below for reference.    Significant Diagnostic Studies: No results found.  Microbiology: No results found for this or any previous visit (from the past 240 hour(s)).   Labs: Basic Metabolic Panel:  Recent Labs Lab 04/09/16 1521 04/10/16 0415  NA 138 136  K 3.8 3.4*  CL  --  103  CO2 23 25  GLUCOSE 189* 210*  BUN 26.3* 29*  CREATININE 2.1* 1.99*  CALCIUM 10.9* 10.0   Liver Function Tests:  Recent Labs Lab 04/09/16 1521 04/10/16 0415  AST 90* 68*  ALT 69* 59  ALKPHOS 120 91  BILITOT 1.33* 1.8*  PROT 7.3 7.0  ALBUMIN 2.2* 2.4*   No results for input(s): LIPASE, AMYLASE in the last 168 hours. No results for input(s): AMMONIA in the last 168 hours. CBC:  Recent Labs Lab 04/09/16 1521 04/10/16 0415  WBC 7.2 7.6  NEUTROABS 4.9  --   HGB 8.4* 9.5*  HCT 25.5* 27.7*  MCV 75.4* 75.7*  PLT 291 298   Cardiac Enzymes: No results for input(s): CKTOTAL, CKMB, CKMBINDEX, TROPONINI in the last 168 hours. BNP: BNP (last 3 results)  Recent Labs  11/03/15 1913  BNP 215.8*    ProBNP (last 3 results) No results for input(s): PROBNP in the last 8760 hours.  CBG:  Recent Labs Lab 04/09/16 1839 04/09/16 2152 04/10/16 0816 04/10/16 1152  GLUCAP 162* 309* 209* 190*       Signed:  Eleonore Chiquito S MD.  [facility] 04/10/2016, 3:41 PM

## 2016-04-10 NOTE — Care Management Obs Status (Signed)
[place] NOTIFICATION   Patient Details  Name: Ernest Lara MRN: [id number] Date of Birth: 1941-04-20   Medicare Observation Status Notification Given:  Yes    Lynnell Catalan, RN 04/10/2016, 9:49 AM

## 2016-04-10 NOTE — Progress Notes (Signed)
Inpatient Diabetes Program Recommendations  AACE/ADA: New Consensus Statement on Inpatient Glycemic Control (2015)  Target Ranges:  Prepandial:   less than 140 mg/dL      Peak postprandial:   less than 180 mg/dL (1-2 hours)      Critically ill patients:  140 - 180 mg/dL   Lab Results  Component Value Date   GLUCAP 190 (H) 04/10/2016   HGBA1C 7.3 (H) 11/08/2015    Review of Glycemic Control  Diabetes history: DM2 Outpatient Diabetes medications: Januvia 100 mg QD, glipizide 5 mg bid Current orders for Inpatient glycemic control: Novolog moderate tidwc and hs, tradjenta 5 mg QD, glipizide 5 mg bid  Spoke with pt and daughter about his glycemic control at home. Pt states he does not check his blood sugar each day. Pt wanted to know why his MD discontinued metformin approximately a month or two ago. Also discussed importance of not skipping meals while taking glipizide, to prevent hypoglycemia. Told pt that metformin was likely d/ced d/t renal insufficiency, although pt should discuss with his oncologist.  Pt requests prescription for meter and supplies at discharge. Instructed pt to: Check blood sugars at least 2x/day and call endo with blood sugar range for the week.  Would not recommend pt going home on insulin at this time. Pt lives alone and risk of hypoglycemia would outweigh the benefits.  Answered questions and discussed above with RN.  Continue to follow. Thank you. Lorenda Peck, RD, LDN, CDE Inpatient Diabetes Coordinator [phone]

## 2016-04-10 NOTE — Progress Notes (Signed)
Pt discharged home with daughter in stable condition.  Discharge instructions and script given. Pt and daughter verbalized understanding. No immediate questions or concerns at this time.

## 2016-04-11 ENCOUNTER — Other Ambulatory Visit: Payer: Medicare Other

## 2016-04-11 ENCOUNTER — Ambulatory Visit: Payer: Medicare Other

## 2016-04-11 ENCOUNTER — Ambulatory Visit: Payer: Medicare Other | Admitting: Hematology

## 2016-04-11 LAB — HEMOGLOBIN A1C
HEMOGLOBIN A1C: 11.4 % — AB (ref 4.8–5.6)
MEAN PLASMA GLUCOSE: 280 mg/dL

## 2016-04-15 NOTE — Progress Notes (Signed)
Marland Kitchen    HEMATOLOGY/ONCOLOGY CLINIC NOTE  Date of Service:04/02/2016  Patient Care Team: [name], MD as PCP - General (Endocrinology)  CHIEF COMPLAINTS - follow-up for chemotherapy for colon cancer  Diagnosis: Stage IIIB Colon Cancer diagnosed 11/10/2015  Current treatment  Adjuvant FOLFOX status post 6 cycles.  Previous treatment -  left hemicolectomy 11/10/2015 - Port-A-Cath placement 12/29/2015  INTERVAL HISTORY  Patient is here for his scheduled follow-up prior to his 7th cycle of FOLFOX chemotherapy . He notes significant grade 2-3 fatigue. Poor oral intake. Still uncontrolled diabetes. Has not been able to see his primary care physician yet for optimization of his diabetes management. We discussed and decided to drop the Oxaloplatin and give him an extra week off to recover from his fatigue and to focus on improving food intake and physical activity and to control his diabetes with his primary care physician  MEDICAL HISTORY:  Past Medical History:  Diagnosis Date  . CKD (chronic kidney disease) stage 3, GFR 30-59 ml/min   . Complete heart block ([place])    s. 05/02/2015 s/p BSX U128 Valitude CRT-P Beckie Salts).  Marland Kitchen GIB (gastrointestinal bleeding)    a. 10/2015- Hgb 4.6-->8u PRBC's;  b. 10/2015 EGD: non bleeding H pylori + ulceration; c. 10/2015 Colonoscopy: invasive adenocarcinoma.  . History of cardiomyopathy    a. Nonischemic, possibly tachycardia mediated;  b. 04/2015 Echo:  EF 60-65%, no rwma, mild AI/MR, mod dil LA/RA, PASP 36mHg.  .Marland KitchenHypertension   . Hypertensive heart disease   . OSA (obstructive sleep apnea), pt with apnea during procedures 10/11/2011   Denies  . Paroxysmal atrial fibrillation (HCC)    a. CHA2DS2VASc = 2-3 (Xarelto).  . Stage IIIB Colon Cancer    a. 11/2015 Colonscopy: invasive adenocarcinoma;  b. 11/2015 s/p lap L colectomy; c. Pathology: pT3, pN1c Mx, MSI stable.  . Type 2 diabetes mellitus ([place]     SURGICAL HISTORY: Past Surgical History:    Procedure Laterality Date  . CARDIAC CATHETERIZATION    . CARDIOVERSION  10/11/2011   Procedure: CARDIOVERSION;  Surgeon: DLeonie Man MD;  [place]  Service: Cardiovascular;  Laterality: N/A;  . COLONOSCOPY N/A 11/05/2015   Procedure: COLONOSCOPY;  Surgeon: RRonald Lobo MD;  [facility];  Service: Endoscopy;  Laterality: N/A;  . EP IMPLANTABLE DEVICE N/A 05/02/2015   Procedure: BiV Pacemaker Insertion CRT-P;  Surgeon: GEvans Lance MD;  [place] [facility];  Service: Cardiovascular;  Laterality: N/A;  . ESOPHAGOGASTRODUODENOSCOPY N/A 11/05/2015   Procedure: ESOPHAGOGASTRODUODENOSCOPY (EGD);  Surgeon: RRonald Lobo MD;  [facility];  Service: Endoscopy;  Laterality: N/A;  . EYE SURGERY  2015   left cataract with lens implant  . LAPAROSCOPIC PARTIAL COLECTOMY N/A 11/10/2015   Procedure: LAPAROSCOPIC LEFT COLECTOMY LAPAROSCOPIC MOBILIZATION OF SPLENIC FLEXURE;  Surgeon: EGreer Pickerel MD;  [place]  Service: General;  Laterality: N/A;  . Nuclear stress  11/01/2011   Severe global hypokinesis,dilated ventricle  . PORTACATH PLACEMENT N/A 12/29/2015   Procedure: INSERTION PORT-A-CATH WITH ULTRASOUND GUIDANCE;  Surgeon: EGreer Pickerel MD;  [facility];  Service: General;  Laterality: N/A;  . TEE WITHOUT CARDIOVERSION  10/10/2011   Procedure: TRANSESOPHAGEAL ECHOCARDIOGRAM (TEE);  Surgeon: MSanda Klein MD;  [facility];  Service: Cardiovascular;  Laterality: N/A;    SOCIAL HISTORY: Social History   Social History  . Marital status: Married    Spouse name: N/A  . Number of children: N/A  . Years of education: N/A  Occupational History  . Not on file.   Social History Main Topics  . Smoking status: Never Smoker  . Smokeless tobacco: Never Used  . Alcohol use No  . Drug use: No  . Sexual activity: Not Currently   Other Topics Concern  . Not on file   Social History Narrative  . No narrative on file    FAMILY  HISTORY: Family History  Problem Relation Age of Onset  . Colon cancer Mother   . Kidney disease Father     ESRF/HD, deceased    ALLERGIES:  is allergic to amiodarone and allopurinol.  MEDICATIONS:  Current Outpatient Prescriptions  Medication Sig Dispense Refill  . benazepril (LOTENSIN) 40 MG tablet TAKE 1 TABLET (40 MG TOTAL) BY MOUTH DAILY. 30 tablet 6  . chlorhexidine (PERIDEX) 0.12 % solution Use as directed 15 mLs in the mouth or throat 2 (two) times daily. 473 mL 0  . dexamethasone (DECADRON) 4 MG tablet Take 2 tablets (8 mg total) by mouth daily. Start the day after chemotherapy for 2 days. Take with food. (Patient not taking: Reported on 04/09/2016) 30 tablet 1  . doxazosin (CARDURA) 8 MG tablet TAKE 1 TABLET (8 MG TOTAL) BY MOUTH AT BEDTIME. 30 tablet 9  . ferrous sulfate 325 (65 FE) MG EC tablet Take 1 tablet (325 mg total) by mouth daily with breakfast. 30 tablet 0  . lidocaine-prilocaine (EMLA) cream Apply to affected area once 30 g 3  . ondansetron (ZOFRAN) 8 MG tablet Take 1 tablet (8 mg total) by mouth 2 (two) times daily as needed for refractory nausea / vomiting. Start on day 3 after chemotherapy. 30 tablet 1  . potassium chloride (KLOR-CON M10) 10 MEQ tablet Take 1 tablet (10 mEq total) by mouth every other day. 15 tablet 11  . prochlorperazine (COMPAZINE) 10 MG tablet Take 1 tablet (10 mg total) by mouth every 6 (six) hours as needed (Nausea or vomiting). (Patient not taking: Reported on 04/09/2016) 30 tablet 1  . sitaGLIPtin (JANUVIA) 100 MG tablet Take 100 mg by mouth daily.    Alveda Reasons 20 MG TABS tablet TAKE 1 TABLET BY MOUTH DAILY (Patient taking differently: TAKE 1 TABLET BY MOUTH EVERY EVENING) 30 tablet 5  . carvedilol (COREG) 12.5 MG tablet Take 1 tablet (12.5 mg total) by mouth 2 (two) times daily with a meal. 60 tablet 2  . furosemide (LASIX) 80 MG tablet Take 80 mg by mouth every other day.     Marland Kitchen glipiZIDE (GLUCOTROL) 5 MG tablet Take 5 mg by mouth 2 (two)  times daily before a meal.     . rosuvastatin (CRESTOR) 20 MG tablet Take 20 mg by mouth every evening.     . SUPER B COMPLEX & C TABS TAKE 1 CAPSULE BY MOUTH DAILY. 100 tablet 0  . UNABLE TO FIND Glucometer - 1  Lancets - 100  Test strips - 100  Alcohol pads - 100 1 each 0   No current facility-administered medications for this visit.     REVIEW OF SYSTEMS:    10 Point review of Systems was done is negative except as noted above.  PHYSICAL EXAMINATION: ECOG PERFORMANCE STATUS: 2 - Symptomatic, <50% confined to bed  . Vitals:   04/02/16 1514  BP: (!) 147/60  Pulse: 91  Resp: 18  Temp: 97.8 F (36.6 C)   Filed Weights   04/02/16 1514  Weight: 219 lb 9.6 oz (99.6 kg)   .Body mass index is 27.45 kg/m.   Marland Kitchen  Wt Readings from Last 3 Encounters:  04/09/16 221 lb (100.2 kg)  04/09/16 221 lb 4.8 oz (100.4 kg)  04/02/16 219 lb 9.6 oz (99.6 kg)   GENERAL:alert, in no acute distress and comfortable SKIN: skin color, texture, turgor are normal, no rashes or significant lesions EYES: normal, conjunctiva are pink and non-injected, sclera clear OROPHARYNX:no exudate, no erythema and lips, buccal mucosa, and tongue normal  NECK: supple, no JVD, thyroid normal size, non-tender, without nodularity LYMPH:  no palpable lymphadenopathy in the cervical, axillary or inguinal LUNGS: clear to auscultation with normal respiratory effort HEART: regular rate & rhythm,  no murmurs and no lower extremity edema ABDOMEN: Abdominal surgical incision has healed with minimal superficial crusting remaining . Musculoskeletal: no cyanosis of digits and no clubbing  PSYCH: alert & oriented x 3 with fluent speech NEURO: no focal motor/sensory deficits  LABORATORY DATA:  I have reviewed the data as listed  . CBC Latest Ref Rng & Units 04/02/2016  WBC 4.0 - 10.5 K/uL 2.5(L)  Hemoglobin 13.0 - 17.0 g/dL 9.4(L)  Hematocrit 39.0 - 52.0 % 27.5(L)  Platelets 150 - 400 K/uL 159   . CMP Latest Ref Rng  & Units 04/02/2016  Glucose 65 - 99 mg/dL 336(H)  BUN 6 - 20 mg/dL 17.7  Creatinine 0.61 - 1.24 mg/dL 2.0(H)  Sodium 135 - 145 mmol/L 138  Potassium 3.5 - 5.1 mmol/L 3.8  Chloride 101 - 111 mmol/L -  CO2 22 - 32 mmol/L 26  Calcium 8.9 - 10.3 mg/dL 10.1  Total Protein 6.5 - 8.1 g/dL 7.0  Total Bilirubin 0.3 - 1.2 mg/dL 1.36(H)  Alkaline Phos 38 - 126 U/L 85  AST 15 - 41 U/L 15  ALT 17 - 63 U/L 14    RADIOGRAPHIC STUDIES: I have personally reviewed the radiological images as listed and agreed with the findings in the report. No results found.  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 Patient is s/p left sided colectomy on 11/10/2015 . Surgical incision Has completely healed up.  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 Patient received 8 units of PRBC and IV iron in the hospital unusual presentation. Hgb improving. B12 was borderline low- improved now. Post-operative CEA WNL 3) HTN - controlled.  4) DM2 - uncontrolled in the setting of chemotherapy and steroid use. Somewhat better with GLipizide per patient. 5) Non ischemic chronic diastolic CHF 6) P. a fib on Xarelto per cardiology - will need to be mindful of his renal function. 7) CKD 3 with some increase in the creatinine over baseline.  Patient is also on ACE inhibitor as per primary care physician and diuretics which will need to be monitored closely. 8) Duodenal ulceration (h pylori -positive) - completed Abx for rx. On PPI 9) thrombocytopenia likely related to chemotherapy. Reasonable at 159k 10) grade 2-3 fatigue affecting the patient's quality of life - this appears to be multifactorial the patient's chemotherapy , uncontrolled diabetes and poor oral intake and multiple other medical comorbidities . PLAN -Given the patient's limiting fatigue we will hold off on his next cycle of FOLFOX at this time . -We will discontinue his  Oxaloplatin for future cycles . -We will reschedule him for 5-FU leucovorin next week  -We recommended you focus on his food intake. Keeping Physically active and following up with his primary care physician for optimization of his diabetes. --continue Glipizide ER and Januvia.  will likely need to switch to basal insulin. We'll need to address comprehensively with his primary care physician. --Also he is on multiple antihypertensives including Lasix and and ace inhibitors which puts him at high risk for fluctuations and renal function based on volume status and ACE inhibitor. This will need to be closely monitored with his primary care physician as well. -continue po replacement of B12/Bcomplex and continue po iron. -continue to maintain ambulation and good po intake -continue f/u with PCP for management of other medical problems.  Cancel FOLFOX scheduled for tomorrow. Reschedule 5FU/leucovorin for 04/11/2016 (no Oxaloplatin) RTC with Dr Irene Limbo with labs on 04/11/2016 prior to chemotherapy.  I spent 20 minutes counseling the patient face to face. The total time spent in the appointment was 25 minutes and more than 50% was on counseling and direct patient cares.    Sullivan Lone MD [place] AAHIVMS [facility] Hematology/Oncology Physician [facility]  (Office):       [phone] (Work cell):  [phone] (Fax):           [phone]

## 2016-04-15 NOTE — Progress Notes (Signed)
Ernest Lara    HEMATOLOGY/ONCOLOGY CLINIC NOTE  Date of Service:04/09/2016  Patient Care Team: [name], MD as PCP - General (Endocrinology)  CHIEF COMPLAINTS - follow-up for chemotherapy for colon cancer  Diagnosis: Stage IIIB Colon Cancer diagnosed 11/10/2015  Current treatment  Adjuvant FOLFOX status post 6 cycles.  Previous treatment -  left hemicolectomy 11/10/2015 - Port-A-Cath placement 12/29/2015  INTERVAL HISTORY  Patient is here for an earlier than scheduled followup due to failure to thrive at home. He notes significant fatigue despite getting week of chemotherapy. His diabetes is still uncontrolled and he is eating poorly. Also noted to have slight drop in hemoglobin down to 8.4 and could be symptomatic from this. Notes no overt GI bleeding. Also appeared to be somewhat dehydrated. He reports that his primary care physician is out of town and that he could not be seen by anybody in this practice.  MEDICAL HISTORY:  Past Medical History:  Diagnosis Date  . CKD (chronic kidney disease) stage 3, GFR 30-59 ml/min   . Complete heart block ([place])    s. 05/02/2015 s/p BSX U128 Valitude CRT-P Beckie Salts).  Ernest Lara GIB (gastrointestinal bleeding)    a. 10/2015- Hgb 4.6-->8u PRBC's;  b. 10/2015 EGD: non bleeding H pylori + ulceration; c. 10/2015 Colonoscopy: invasive adenocarcinoma.  . History of cardiomyopathy    a. Nonischemic, possibly tachycardia mediated;  b. 04/2015 Echo:  EF 60-65%, no rwma, mild AI/MR, mod dil LA/RA, PASP 59mHg.  .Ernest KitchenHypertension   . Hypertensive heart disease   . OSA (obstructive sleep apnea), pt with apnea during procedures 10/11/2011   Denies  . Paroxysmal atrial fibrillation (HCC)    a. CHA2DS2VASc = 2-3 (Xarelto).  . Stage IIIB Colon Cancer    a. 11/2015 Colonscopy: invasive adenocarcinoma;  b. 11/2015 s/p lap L colectomy; c. Pathology: pT3, pN1c Mx, MSI stable.  . Type 2 diabetes mellitus ([place]     SURGICAL HISTORY: Past Surgical History:  Procedure  Laterality Date  . CARDIAC CATHETERIZATION    . CARDIOVERSION  10/11/2011   Procedure: CARDIOVERSION;  Surgeon: DLeonie Man MD;  [place]  Service: Cardiovascular;  Laterality: N/A;  . COLONOSCOPY N/A 11/05/2015   Procedure: COLONOSCOPY;  Surgeon: RRonald Lobo MD;  [facility];  Service: Endoscopy;  Laterality: N/A;  . EP IMPLANTABLE DEVICE N/A 05/02/2015   Procedure: BiV Pacemaker Insertion CRT-P;  Surgeon: GEvans Lance MD;  [place] [place];  Service: Cardiovascular;  Laterality: N/A;  . ESOPHAGOGASTRODUODENOSCOPY N/A 11/05/2015   Procedure: ESOPHAGOGASTRODUODENOSCOPY (EGD);  Surgeon: RRonald Lobo MD;  [facility];  Service: Endoscopy;  Laterality: N/A;  . EYE SURGERY  2015   left cataract with lens implant  . LAPAROSCOPIC PARTIAL COLECTOMY N/A 11/10/2015   Procedure: LAPAROSCOPIC LEFT COLECTOMY LAPAROSCOPIC MOBILIZATION OF SPLENIC FLEXURE;  Surgeon: EGreer Pickerel MD;  [place]  Service: General;  Laterality: N/A;  . Nuclear stress  11/01/2011   Severe global hypokinesis,dilated ventricle  . PORTACATH PLACEMENT N/A 12/29/2015   Procedure: INSERTION PORT-A-CATH WITH ULTRASOUND GUIDANCE;  Surgeon: EGreer Pickerel MD;  [facility];  Service: General;  Laterality: N/A;  . TEE WITHOUT CARDIOVERSION  10/10/2011   Procedure: TRANSESOPHAGEAL ECHOCARDIOGRAM (TEE);  Surgeon: MSanda Klein MD;  [facility];  Service: Cardiovascular;  Laterality: N/A;    SOCIAL HISTORY: Social History   Social History  . Marital status: Married    Spouse name: N/A  . Number of children: N/A  . Years of education: N/A  Occupational History  . Not on file.   Social History Main Topics  . Smoking status: Never Smoker  . Smokeless tobacco: Never Used  . Alcohol use No  . Drug use: No  . Sexual activity: Not Currently   Other Topics Concern  . Not on file   Social History Narrative  . No narrative on file    FAMILY HISTORY: Family  History  Problem Relation Age of Onset  . Colon cancer Mother   . Kidney disease Father     ESRF/HD, deceased    ALLERGIES:  is allergic to amiodarone and allopurinol.  MEDICATIONS:  Current Outpatient Prescriptions  Medication Sig Dispense Refill  . benazepril (LOTENSIN) 40 MG tablet TAKE 1 TABLET (40 MG TOTAL) BY MOUTH DAILY. 30 tablet 6  . carvedilol (COREG) 12.5 MG tablet Take 1 tablet (12.5 mg total) by mouth 2 (two) times daily with a meal. 60 tablet 2  . chlorhexidine (PERIDEX) 0.12 % solution Use as directed 15 mLs in the mouth or throat 2 (two) times daily. 473 mL 0  . dexamethasone (DECADRON) 4 MG tablet Take 2 tablets (8 mg total) by mouth daily. Start the day after chemotherapy for 2 days. Take with food. (Patient not taking: Reported on 04/09/2016) 30 tablet 1  . doxazosin (CARDURA) 8 MG tablet TAKE 1 TABLET (8 MG TOTAL) BY MOUTH AT BEDTIME. 30 tablet 9  . ferrous sulfate 325 (65 FE) MG EC tablet Take 1 tablet (325 mg total) by mouth daily with breakfast. 30 tablet 0  . furosemide (LASIX) 80 MG tablet Take 80 mg by mouth every other day.     Ernest Lara glipiZIDE (GLUCOTROL) 5 MG tablet Take 5 mg by mouth 2 (two) times daily before a meal.     . lidocaine-prilocaine (EMLA) cream Apply to affected area once 30 g 3  . ondansetron (ZOFRAN) 8 MG tablet Take 1 tablet (8 mg total) by mouth 2 (two) times daily as needed for refractory nausea / vomiting. Start on day 3 after chemotherapy. 30 tablet 1  . potassium chloride (KLOR-CON M10) 10 MEQ tablet Take 1 tablet (10 mEq total) by mouth every other day. 15 tablet 11  . prochlorperazine (COMPAZINE) 10 MG tablet Take 1 tablet (10 mg total) by mouth every 6 (six) hours as needed (Nausea or vomiting). (Patient not taking: Reported on 04/09/2016) 30 tablet 1  . rosuvastatin (CRESTOR) 20 MG tablet Take 20 mg by mouth every evening.     . sitaGLIPtin (JANUVIA) 100 MG tablet Take 100 mg by mouth daily.    . SUPER B COMPLEX & C TABS TAKE 1 CAPSULE BY  MOUTH DAILY. 100 tablet 0  . UNABLE TO FIND Glucometer - 1  Lancets - 100  Test strips - 100  Alcohol pads - 100 1 each 0  . XARELTO 20 MG TABS tablet TAKE 1 TABLET BY MOUTH DAILY (Patient taking differently: TAKE 1 TABLET BY MOUTH EVERY EVENING) 30 tablet 5   No current facility-administered medications for this visit.     REVIEW OF SYSTEMS:    10 Point review of Systems was done is negative except as noted above.  PHYSICAL EXAMINATION: ECOG PERFORMANCE STATUS: 2 - Symptomatic, <50% confined to bed  . Vitals:   04/09/16 1553  BP: (!) 104/46  Pulse: 89  Resp: 16  Temp: 98.1 F (36.7 C)   Filed Weights   04/09/16 1553  Weight: 221 lb 4.8 oz (100.4 kg)   .Body mass index is 27.66 kg/m.   Ernest Lara  Wt Readings from Last 3 Encounters:  04/09/16 221 lb (100.2 kg)  04/09/16 221 lb 4.8 oz (100.4 kg)  04/02/16 219 lb 9.6 oz (99.6 kg)   GENERAL:alert, in no acute distress and comfortable SKIN: skin color, texture, turgor are normal, no rashes or significant lesions EYES: normal, conjunctiva are pink and non-injected, sclera clear OROPHARYNX:no exudate, no erythema and lips, buccal mucosa, and tongue normal  NECK: supple, no JVD, thyroid normal size, non-tender, without nodularity LYMPH:  no palpable lymphadenopathy in the cervical, axillary or inguinal LUNGS: clear to auscultation with normal respiratory effort HEART: regular rate & rhythm,  no murmurs and no lower extremity edema ABDOMEN: Abdominal surgical incision has healed with minimal superficial crusting remaining . Musculoskeletal: no cyanosis of digits and no clubbing  PSYCH: alert & oriented x 3 with fluent speech NEURO: no focal motor/sensory deficits  LABORATORY DATA:  I have reviewed the data as listed . CBC Latest Ref Rng & Units 04/09/2016 04/02/2016  WBC 4.0 - 10.5 K/uL 7.2 2.5(L)  Hemoglobin 13.0 - 17.0 g/dL 8.4(L) 9.4(L)  Hematocrit 39.0 - 52.0 % 25.5(L) 27.5(L)  Platelets 150 - 400 K/uL 291 159      . CMP Latest Ref Rng & Units 04/09/2016 04/02/2016  Glucose 65 - 99 mg/dL 189(H) 336(H)  BUN 6 - 20 mg/dL 26.3(H) 17.7  Creatinine 0.61 - 1.24 mg/dL 2.1(H) 2.0(H)  Sodium 135 - 145 mmol/L 138 138  Potassium 3.5 - 5.1 mmol/L 3.8 3.8  Chloride 101 - 111 mmol/L - -  CO2 22 - 32 mmol/L 23 26  Calcium 8.9 - 10.3 mg/dL 10.9(H) 10.1  Total Protein 6.5 - 8.1 g/dL 7.3 7.0  Total Bilirubin 0.3 - 1.2 mg/dL 1.33(H) 1.36(H)  Alkaline Phos 38 - 126 U/L 120 85  AST 15 - 41 U/L 90(H) 15  ALT 17 - 63 U/L 69(H) 14   RADIOGRAPHIC STUDIES: I have personally reviewed the radiological images as listed and agreed with the findings in the report. No results found.  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 Patient is s/p left sided colectomy on 11/10/2015 . Surgical incision Has completely healed up. Patient is status post 6 cycles of FOLFOX Now has grade 2-3 fatigue with chemotherapy on hold 2) grade 2-3 fatigue affecting the patient's quality of life - this appears to be multifactorial the patient's chemotherapy , uncontrolled diabetes and poor oral intake and multiple other medical comorbidities . 3) Abnormal LFTs -likely from chemotherapy. No pain suggestive of cholecystitis 4 hypercalcemia likely due to dehydration 5) Symptomatic anemia  Plan -We'll admit the patient to the hospital for failure to thrive since he cannot function at home. -We'll transfuse 2 units of PRBCs for symptomatic anemia and monitor for GI bleeding -We'll repeat chemistries including liver function tests to monitor -IV fluids as per volume status -Optimize diabetes management and consider starting the patient on basal insulin. -PTOT evaluation determined need for home cares -We shall delay the next cycle of 5-FU leucovorin by another week.  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 Hgb improving. B12 was borderline low-  improved now. Post-operative CEA WNL 3) HTN - controlled.  4) DM2uncontrolled will likely need to be switched to basal insulin 5) Non ischemic chronic diastolic CHF 6) P. a fib on Xarelto per cardiology - will need to be mindful of his renal function and watch for bleeding. 7) CKD 3 with some  increase in the creatinine over baseline.  Patient is also on ACE inhibitor as per primary care physician and diuretics which will need to be monitored closely. 8) Duodenal ulceration (h pylori -positive) - completed Abx for rx. On PPI 9) thrombocytopenia likely related to chemotherapy. Reasonable at 159k 10) PLAN -Given the patient's limiting fatigue we will hold off on his next cycle of chemotherapy at this time . -We will discontinue his Oxaloplatin for future cycles . -We will reschedule him for 5-FU leucovorin.  -we will delay by an additional week -continue po replacement of B12/Bcomplex and continue po iron. -continue to maintain ambulation and good po intake -continue f/u with PCP for management of other medical problems.   I spent 20 minutes counseling the patient face to face. The total time spent in the appointment was 25 minutes and more than 50% was on counseling and direct patient cares in coordination with the hospitalist for inpatient hospitalization.    Sullivan Lone MD [place] AAHIVMS [facility] Hematology/Oncology Physician [facility]  (Office):       [phone] (Work cell):  [phone] (Fax):           [phone]

## 2016-04-17 ENCOUNTER — Ambulatory Visit (HOSPITAL_BASED_OUTPATIENT_CLINIC_OR_DEPARTMENT_OTHER): Payer: Medicare Other

## 2016-04-17 VITALS — BP 94/63 | HR 80 | Temp 98.0°F

## 2016-04-17 DIAGNOSIS — C184 Malignant neoplasm of transverse colon: Secondary | ICD-10-CM | POA: Diagnosis not present

## 2016-04-17 DIAGNOSIS — Z5111 Encounter for antineoplastic chemotherapy: Secondary | ICD-10-CM

## 2016-04-17 DIAGNOSIS — Z9189 Other specified personal risk factors, not elsewhere classified: Secondary | ICD-10-CM

## 2016-04-17 DIAGNOSIS — D509 Iron deficiency anemia, unspecified: Secondary | ICD-10-CM

## 2016-04-17 LAB — COMPREHENSIVE METABOLIC PANEL
ALBUMIN: 2.3 g/dL — AB (ref 3.5–5.0)
ALK PHOS: 92 U/L (ref 40–150)
ALT: 25 U/L (ref 0–55)
AST: 24 U/L (ref 5–34)
Anion Gap: 10 mEq/L (ref 3–11)
BILIRUBIN TOTAL: 0.96 mg/dL (ref 0.20–1.20)
BUN: 16.2 mg/dL (ref 7.0–26.0)
CO2: 28 meq/L (ref 22–29)
CREATININE: 1.4 mg/dL — AB (ref 0.7–1.3)
Calcium: 10.6 mg/dL — ABNORMAL HIGH (ref 8.4–10.4)
Chloride: 100 mEq/L (ref 98–109)
EGFR: 57 mL/min/{1.73_m2} — ABNORMAL LOW (ref >90)
GLUCOSE: 168 mg/dL — AB (ref 70–140)
Potassium: 3.9 mEq/L (ref 3.5–5.1)
SODIUM: 137 meq/L (ref 136–145)
TOTAL PROTEIN: 7.3 g/dL (ref 6.4–8.3)

## 2016-04-17 LAB — CBC & DIFF AND RETIC
BASO%: 0.6 % (ref 0.0–2.0)
Basophils Absolute: 0 10*3/uL (ref 0.0–0.1)
EOS ABS: 0.1 10*3/uL (ref 0.0–0.5)
EOS%: 1.5 % (ref 0.0–7.0)
HCT: 28.7 % — ABNORMAL LOW (ref 38.4–49.9)
HEMOGLOBIN: 9.4 g/dL — AB (ref 13.0–17.1)
IMMATURE RETIC FRACT: 14.6 % — AB (ref 3.00–10.60)
LYMPH#: 1.1 10*3/uL (ref 0.9–3.3)
LYMPH%: 16.9 % (ref 14.0–49.0)
MCH: 25.5 pg — ABNORMAL LOW (ref 27.2–33.4)
MCHC: 32.8 g/dL (ref 32.0–36.0)
MCV: 78 fL — ABNORMAL LOW (ref 79.3–98.0)
MONO#: 0.5 10*3/uL (ref 0.1–0.9)
MONO%: 7.8 % (ref 0.0–14.0)
NEUT%: 73.2 % (ref 39.0–75.0)
NEUTROS ABS: 5 10*3/uL (ref 1.5–6.5)
Platelets: 245 10*3/uL (ref 140–400)
RBC: 3.68 10*6/uL — AB (ref 4.20–5.82)
RDW: 17 % — ABNORMAL HIGH (ref 11.0–14.6)
RETIC CT ABS: 64.77 10*3/uL (ref 34.80–93.90)
Retic %: 1.76 % (ref 0.80–1.80)
WBC: 6.8 10*3/uL (ref 4.0–10.3)

## 2016-04-17 MED ORDER — SODIUM CHLORIDE 0.9 % IV SOLN
2400.0000 mg/m2 | INTRAVENOUS | Status: DC
  Administered 2016-04-17: 5850 mg via INTRAVENOUS
  Filled 2016-04-17: qty 117

## 2016-04-17 MED ORDER — SODIUM CHLORIDE 0.9 % IV SOLN
400.0000 mg/m2 | Freq: Once | INTRAVENOUS | Status: AC
  Administered 2016-04-17: 972 mg via INTRAVENOUS
  Filled 2016-04-17: qty 48.6

## 2016-04-17 MED ORDER — DEXAMETHASONE SODIUM PHOSPHATE 10 MG/ML IJ SOLN: 4.0000 mg | Freq: Once | INTRAMUSCULAR | Status: DC

## 2016-04-17 MED ORDER — PALONOSETRON HCL INJECTION 0.25 MG/5ML
0.2500 mg | Freq: Once | INTRAVENOUS | Status: AC
  Administered 2016-04-17: 0.25 mg via INTRAVENOUS

## 2016-04-17 MED ORDER — SODIUM CHLORIDE 0.9 % IV SOLN
Freq: Once | INTRAVENOUS | Status: AC
  Administered 2016-04-17: 14:00:00 via INTRAVENOUS

## 2016-04-17 MED ORDER — DEXAMETHASONE SODIUM PHOSPHATE 10 MG/ML IJ SOLN
INTRAMUSCULAR | Status: AC
  Filled 2016-04-17: qty 1

## 2016-04-17 MED ORDER — FLUOROURACIL CHEMO INJECTION 2.5 GM/50ML
400.0000 mg/m2 | Freq: Once | INTRAVENOUS | Status: AC
  Administered 2016-04-17: 950 mg via INTRAVENOUS
  Filled 2016-04-17: qty 19

## 2016-04-17 MED ORDER — PALONOSETRON HCL INJECTION 0.25 MG/5ML
INTRAVENOUS | Status: AC
  Filled 2016-04-17: qty 5

## 2016-04-17 MED ORDER — DEXTROSE 5 % IV SOLN: Freq: Once | INTRAVENOUS | Status: DC

## 2016-04-17 MED ORDER — DEXAMETHASONE SODIUM PHOSPHATE 10 MG/ML IJ SOLN
10.0000 mg | Freq: Once | INTRAMUSCULAR | Status: DC
  Administered 2016-04-17: 10 mg via INTRAVENOUS

## 2016-04-17 NOTE — Patient Instructions (Signed)
[place] Discharge Instructions for Patients Receiving Chemotherapy  Today you received the following chemotherapy agents: Leucovorin and Adrucil pump.  To help prevent nausea and vomiting after your treatment, we encourage you to take your nausea medication as directed. If you develop nausea and vomiting that is not controlled by your nausea medication, call the clinic.   BELOW ARE SYMPTOMS THAT SHOULD BE REPORTED IMMEDIATELY:  *FEVER GREATER THAN 100.5 F  *CHILLS WITH OR WITHOUT FEVER  NAUSEA AND VOMITING THAT IS NOT CONTROLLED WITH YOUR NAUSEA MEDICATION  *UNUSUAL SHORTNESS OF BREATH  *UNUSUAL BRUISING OR BLEEDING  TENDERNESS IN MOUTH AND THROAT WITH OR WITHOUT PRESENCE OF ULCERS  *URINARY PROBLEMS  *BOWEL PROBLEMS  UNUSUAL RASH Items with * indicate a potential emergency and should be followed up as soon as possible.  Feel free to call the clinic you have any questions or concerns. The clinic phone number is [phone].  Please show the [place] at check-in to the Emergency Department and triage nurse.

## 2016-04-19 ENCOUNTER — Ambulatory Visit (HOSPITAL_BASED_OUTPATIENT_CLINIC_OR_DEPARTMENT_OTHER): Payer: Medicare Other

## 2016-04-19 DIAGNOSIS — Z452 Encounter for adjustment and management of vascular access device: Secondary | ICD-10-CM | POA: Diagnosis not present

## 2016-04-19 DIAGNOSIS — C184 Malignant neoplasm of transverse colon: Secondary | ICD-10-CM

## 2016-04-19 DIAGNOSIS — Z23 Encounter for immunization: Secondary | ICD-10-CM

## 2016-04-19 MED ORDER — INFLUENZA VAC SPLIT QUAD 0.5 ML IM SUSY
0.5000 mL | PREFILLED_SYRINGE | Freq: Once | INTRAMUSCULAR | Status: DC
  Filled 2016-04-19: qty 0.5

## 2016-04-19 MED ORDER — HEPARIN SOD (PORK) LOCK FLUSH 100 UNIT/ML IV SOLN
500.0000 [IU] | Freq: Once | INTRAVENOUS | Status: AC | PRN
  Administered 2016-04-19: 500 [IU]
  Filled 2016-04-19: qty 5

## 2016-04-19 MED ORDER — SODIUM CHLORIDE 0.9% FLUSH
10.0000 mL | INTRAVENOUS | Status: DC | PRN
  Administered 2016-04-19: 10 mL
  Filled 2016-04-19: qty 10

## 2016-04-19 NOTE — Patient Instructions (Signed)

## 2016-04-28 ENCOUNTER — Other Ambulatory Visit: Payer: Self-pay | Admitting: Cardiovascular Disease

## 2016-04-30 NOTE — Telephone Encounter (Signed)
Rx request sent to pharmacy.  

## 2016-05-01 ENCOUNTER — Ambulatory Visit (HOSPITAL_BASED_OUTPATIENT_CLINIC_OR_DEPARTMENT_OTHER): Payer: Medicare Other

## 2016-05-01 VITALS — BP 144/78 | HR 80 | Temp 98.1°F | Resp 18

## 2016-05-01 DIAGNOSIS — C184 Malignant neoplasm of transverse colon: Secondary | ICD-10-CM

## 2016-05-01 DIAGNOSIS — D509 Iron deficiency anemia, unspecified: Secondary | ICD-10-CM

## 2016-05-01 DIAGNOSIS — Z5111 Encounter for antineoplastic chemotherapy: Secondary | ICD-10-CM

## 2016-05-01 DIAGNOSIS — Z9189 Other specified personal risk factors, not elsewhere classified: Secondary | ICD-10-CM

## 2016-05-01 LAB — CBC & DIFF AND RETIC
BASO%: 0.4 % (ref 0.0–2.0)
Basophils Absolute: 0 10*3/uL (ref 0.0–0.1)
EOS%: 3.9 % (ref 0.0–7.0)
Eosinophils Absolute: 0.2 10*3/uL (ref 0.0–0.5)
HCT: 31 % — ABNORMAL LOW (ref 38.4–49.9)
HGB: 10.1 g/dL — ABNORMAL LOW (ref 13.0–17.1)
IMMATURE RETIC FRACT: 13.6 % — AB (ref 3.00–10.60)
LYMPH%: 21.7 % (ref 14.0–49.0)
MCH: 26.1 pg — AB (ref 27.2–33.4)
MCHC: 32.6 g/dL (ref 32.0–36.0)
MCV: 80.1 fL (ref 79.3–98.0)
MONO#: 0.4 10*3/uL (ref 0.1–0.9)
MONO%: 8.2 % (ref 0.0–14.0)
NEUT%: 65.8 % (ref 39.0–75.0)
NEUTROS ABS: 3.1 10*3/uL (ref 1.5–6.5)
PLATELETS: 155 10*3/uL (ref 140–400)
RBC: 3.87 10*6/uL — AB (ref 4.20–5.82)
RDW: 18.9 % — ABNORMAL HIGH (ref 11.0–14.6)
Retic %: 2.87 % — ABNORMAL HIGH (ref 0.80–1.80)
Retic Ct Abs: 111.07 10*3/uL — ABNORMAL HIGH (ref 34.80–93.90)
WBC: 4.7 10*3/uL (ref 4.0–10.3)
lymph#: 1 10*3/uL (ref 0.9–3.3)

## 2016-05-01 LAB — COMPREHENSIVE METABOLIC PANEL
ALK PHOS: 89 U/L (ref 40–150)
ALT: 15 U/L (ref 0–55)
ANION GAP: 10 meq/L (ref 3–11)
AST: 18 U/L (ref 5–34)
Albumin: 3 g/dL — ABNORMAL LOW (ref 3.5–5.0)
BILIRUBIN TOTAL: 1.05 mg/dL (ref 0.20–1.20)
BUN: 21.8 mg/dL (ref 7.0–26.0)
CO2: 26 meq/L (ref 22–29)
Calcium: 10.3 mg/dL (ref 8.4–10.4)
Chloride: 103 mEq/L (ref 98–109)
Creatinine: 1.7 mg/dL — ABNORMAL HIGH (ref 0.7–1.3)
EGFR: 45 mL/min/{1.73_m2} — AB (ref >90)
GLUCOSE: 242 mg/dL — AB (ref 70–140)
POTASSIUM: 5 meq/L (ref 3.5–5.1)
SODIUM: 139 meq/L (ref 136–145)
TOTAL PROTEIN: 7.4 g/dL (ref 6.4–8.3)

## 2016-05-01 LAB — TECHNOLOGIST REVIEW

## 2016-05-01 MED ORDER — SODIUM CHLORIDE 0.9 % IV SOLN
2400.0000 mg/m2 | INTRAVENOUS | Status: DC
  Administered 2016-05-01: 5850 mg via INTRAVENOUS
  Filled 2016-05-01: qty 117

## 2016-05-01 MED ORDER — PALONOSETRON HCL INJECTION 0.25 MG/5ML
INTRAVENOUS | Status: AC
  Filled 2016-05-01: qty 5

## 2016-05-01 MED ORDER — SODIUM CHLORIDE 0.9 % IV SOLN
Freq: Once | INTRAVENOUS | Status: AC
  Administered 2016-05-01: 11:00:00 via INTRAVENOUS

## 2016-05-01 MED ORDER — FLUOROURACIL CHEMO INJECTION 2.5 GM/50ML
400.0000 mg/m2 | Freq: Once | INTRAVENOUS | Status: AC
  Administered 2016-05-01: 950 mg via INTRAVENOUS
  Filled 2016-05-01: qty 19

## 2016-05-01 MED ORDER — DEXAMETHASONE SODIUM PHOSPHATE 10 MG/ML IJ SOLN
10.0000 mg | Freq: Once | INTRAMUSCULAR | Status: AC
  Administered 2016-05-01: 10 mg via INTRAVENOUS

## 2016-05-01 MED ORDER — PALONOSETRON HCL INJECTION 0.25 MG/5ML
0.2500 mg | Freq: Once | INTRAVENOUS | Status: AC
  Administered 2016-05-01: 0.25 mg via INTRAVENOUS

## 2016-05-01 MED ORDER — DEXAMETHASONE SODIUM PHOSPHATE 10 MG/ML IJ SOLN
INTRAMUSCULAR | Status: AC
  Filled 2016-05-01: qty 1

## 2016-05-01 MED ORDER — LEUCOVORIN CALCIUM INJECTION 350 MG
400.0000 mg/m2 | Freq: Once | INTRAMUSCULAR | Status: AC
  Administered 2016-05-01: 972 mg via INTRAVENOUS
  Filled 2016-05-01: qty 48.6

## 2016-05-01 MED ORDER — SODIUM CHLORIDE 0.9% FLUSH
10.0000 mL | INTRAVENOUS | Status: DC | PRN
  Filled 2016-05-01: qty 10

## 2016-05-01 NOTE — Progress Notes (Signed)
PerDr Julien Nordmann it is okay to treat pt to day with chemotherapy and today cmp results.

## 2016-05-01 NOTE — Progress Notes (Signed)
Per Dr. Providence Lanius to treat with Crt 1.7

## 2016-05-01 NOTE — Patient Instructions (Addendum)
[facility] Discharge Instructions for Patients Receiving Chemotherapy  Today you received the following chemotherapy agents Leucovorin & 5FU  To help prevent nausea and vomiting after your treatment, we encourage you to take your nausea medication as directed.   If you develop nausea and vomiting that is not controlled by your nausea medication, call the clinic.   BELOW ARE SYMPTOMS THAT SHOULD BE REPORTED IMMEDIATELY:  *FEVER GREATER THAN 100.5 F  *CHILLS WITH OR WITHOUT FEVER  NAUSEA AND VOMITING THAT IS NOT CONTROLLED WITH YOUR NAUSEA MEDICATION  *UNUSUAL SHORTNESS OF BREATH  *UNUSUAL BRUISING OR BLEEDING  TENDERNESS IN MOUTH AND THROAT WITH OR WITHOUT PRESENCE OF ULCERS  *URINARY PROBLEMS  *BOWEL PROBLEMS  UNUSUAL RASH Items with * indicate a potential emergency and should be followed up as soon as possible.  Feel free to call the clinic you have any questions or concerns. The clinic phone number is [phone].  Please show the [place] at check-in to the Emergency Department and triage nurse.   Leucovorin injection What is this medicine? LEUCOVORIN (loo koe VOR in) is used to prevent or treat the harmful effects of some medicines. This medicine is used to treat anemia caused by a low amount of folic acid in the body. It is also used with 5-fluorouracil (5-FU) to treat colon cancer. This medicine may be used for other purposes; ask your health care provider or pharmacist if you have questions. What should I tell my health care provider before I take this medicine? They need to know if you have any of these conditions: -anemia from low levels of vitamin B-12 in the blood -an unusual or allergic reaction to leucovorin, folic acid, other medicines, foods, dyes, or preservatives -pregnant or trying to get pregnant -breast-feeding How should I use this medicine? This medicine is for injection into a muscle or into a vein. It is given by a health  care professional in a hospital or clinic setting. Talk to your pediatrician regarding the use of this medicine in children. Special care may be needed. Overdosage: If you think you have taken too much of this medicine contact a poison control center or emergency room at once. NOTE: This medicine is only for you. Do not share this medicine with others. What if I miss a dose? This does not apply. What may interact with this medicine? -capecitabine -fluorouracil -phenobarbital -phenytoin -primidone -trimethoprim-sulfamethoxazole This list may not describe all possible interactions. Give your health care provider a list of all the medicines, herbs, non-prescription drugs, or dietary supplements you use. Also tell them if you smoke, drink alcohol, or use illegal drugs. Some items may interact with your medicine. What should I watch for while using this medicine? Your condition will be monitored carefully while you are receiving this medicine. This medicine may increase the side effects of 5-fluorouracil, 5-FU. Tell your doctor or health care professional if you have diarrhea or mouth sores that do not get better or that get worse. What side effects may I notice from receiving this medicine? Side effects that you should report to your doctor or health care professional as soon as possible: -allergic reactions like skin rash, itching or hives, swelling of the face, lips, or tongue -breathing problems -fever, infection -mouth sores -unusual bleeding or bruising -unusually weak or tired Side effects that usually do not require medical attention (report to your doctor or health care professional if they continue or are bothersome): -constipation or diarrhea -loss of appetite -nausea,  vomiting This list may not describe all possible side effects. Call your doctor for medical advice about side effects. You may report side effects to FDA at 1-800-FDA-1088. Where should I keep my medicine? This  drug is given in a hospital or clinic and will not be stored at home. NOTE: This sheet is a summary. It may not cover all possible information. If you have questions about this medicine, talk to your doctor, pharmacist, or health care provider.  2017 Elsevier/Gold Standard (2007-12-01 16:50:29)   Fluorouracil, 5-FU injection What is this medicine? FLUOROURACIL, 5-FU (flure oh YOOR a sil) is a chemotherapy drug. It slows the growth of cancer cells. This medicine is used to treat many types of cancer like breast cancer, colon or rectal cancer, pancreatic cancer, and stomach cancer. This medicine may be used for other purposes; ask your health care provider or pharmacist if you have questions. COMMON BRAND NAME(S): Adrucil What should I tell my health care provider before I take this medicine? They need to know if you have any of these conditions: -blood disorders -dihydropyrimidine dehydrogenase (DPD) deficiency -infection (especially a virus infection such as chickenpox, cold sores, or herpes) -kidney disease -liver disease -malnourished, poor nutrition -recent or ongoing radiation therapy -an unusual or allergic reaction to fluorouracil, other chemotherapy, other medicines, foods, dyes, or preservatives -pregnant or trying to get pregnant -breast-feeding How should I use this medicine? This drug is given as an infusion or injection into a vein. It is administered in a hospital or clinic by a specially trained health care professional. Talk to your pediatrician regarding the use of this medicine in children. Special care may be needed. Overdosage: If you think you have taken too much of this medicine contact a poison control center or emergency room at once. NOTE: This medicine is only for you. Do not share this medicine with others. What if I miss a dose? It is important not to miss your dose. Call your doctor or health care professional if you are unable to keep an appointment. What  may interact with this medicine? -allopurinol -cimetidine -dapsone -digoxin -hydroxyurea -leucovorin -levamisole -medicines for seizures like ethotoin, fosphenytoin, phenytoin -medicines to increase blood counts like filgrastim, pegfilgrastim, sargramostim -medicines that treat or prevent blood clots like warfarin, enoxaparin, and dalteparin -methotrexate -metronidazole -pyrimethamine -some other chemotherapy drugs like busulfan, cisplatin, estramustine, vinblastine -trimethoprim -trimetrexate -vaccines Talk to your doctor or health care professional before taking any of these medicines: -acetaminophen -aspirin -ibuprofen -ketoprofen -naproxen This list may not describe all possible interactions. Give your health care provider a list of all the medicines, herbs, non-prescription drugs, or dietary supplements you use. Also tell them if you smoke, drink alcohol, or use illegal drugs. Some items may interact with your medicine. What should I watch for while using this medicine? Visit your doctor for checks on your progress. This drug may make you feel generally unwell. This is not uncommon, as chemotherapy can affect healthy cells as well as cancer cells. Report any side effects. Continue your course of treatment even though you feel ill unless your doctor tells you to stop. In some cases, you may be given additional medicines to help with side effects. Follow all directions for their use. Call your doctor or health care professional for advice if you get a fever, chills or sore throat, or other symptoms of a cold or flu. Do not treat yourself. This drug decreases your body's ability to fight infections. Try to avoid being around people who are sick.  This medicine may increase your risk to bruise or bleed. Call your doctor or health care professional if you notice any unusual bleeding. Be careful brushing and flossing your teeth or using a toothpick because you may get an infection or  bleed more easily. If you have any dental work done, tell your dentist you are receiving this medicine. Avoid taking products that contain aspirin, acetaminophen, ibuprofen, naproxen, or ketoprofen unless instructed by your doctor. These medicines may hide a fever. Do not become pregnant while taking this medicine. Women should inform their doctor if they wish to become pregnant or think they might be pregnant. There is a potential for serious side effects to an unborn child. Talk to your health care professional or pharmacist for more information. Do not breast-feed an infant while taking this medicine. Men should inform their doctor if they wish to father a child. This medicine may lower sperm counts. Do not treat diarrhea with over the counter products. Contact your doctor if you have diarrhea that lasts more than 2 days or if it is severe and watery. This medicine can make you more sensitive to the sun. Keep out of the sun. If you cannot avoid being in the sun, wear protective clothing and use sunscreen. Do not use sun lamps or tanning beds/booths. What side effects may I notice from receiving this medicine? Side effects that you should report to your doctor or health care professional as soon as possible: -allergic reactions like skin rash, itching or hives, swelling of the face, lips, or tongue -low blood counts - this medicine may decrease the number of white blood cells, red blood cells and platelets. You may be at increased risk for infections and bleeding. -signs of infection - fever or chills, cough, sore throat, pain or difficulty passing urine -signs of decreased platelets or bleeding - bruising, pinpoint red spots on the skin, black, tarry stools, blood in the urine -signs of decreased red blood cells - unusually weak or tired, fainting spells, lightheadedness -breathing problems -changes in vision -chest pain -mouth sores -nausea and vomiting -pain, swelling, redness at site where  injected -pain, tingling, numbness in the hands or feet -redness, swelling, or sores on hands or feet -stomach pain -unusual bleeding Side effects that usually do not require medical attention (report to your doctor or health care professional if they continue or are bothersome): -changes in finger or toe nails -diarrhea -dry or itchy skin -hair loss -headache -loss of appetite -sensitivity of eyes to the light -stomach upset -unusually teary eyes This list may not describe all possible side effects. Call your doctor for medical advice about side effects. You may report side effects to FDA at 1-800-FDA-1088. Where should I keep my medicine? This drug is given in a hospital or clinic and will not be stored at home. NOTE: This sheet is a summary. It may not cover all possible information. If you have questions about this medicine, talk to your doctor, pharmacist, or health care provider.  2017 Elsevier/Gold Standard (2007-09-30 13:53:16)

## 2016-05-02 ENCOUNTER — Other Ambulatory Visit: Payer: Self-pay | Admitting: Hematology

## 2016-05-03 ENCOUNTER — Ambulatory Visit (HOSPITAL_BASED_OUTPATIENT_CLINIC_OR_DEPARTMENT_OTHER): Payer: Medicare Other

## 2016-05-03 ENCOUNTER — Telehealth: Payer: Self-pay | Admitting: Hematology

## 2016-05-03 VITALS — BP 131/69 | HR 70 | Temp 97.8°F | Resp 18

## 2016-05-03 DIAGNOSIS — C184 Malignant neoplasm of transverse colon: Secondary | ICD-10-CM

## 2016-05-03 DIAGNOSIS — Z452 Encounter for adjustment and management of vascular access device: Secondary | ICD-10-CM

## 2016-05-03 MED ORDER — HEPARIN SOD (PORK) LOCK FLUSH 100 UNIT/ML IV SOLN
500.0000 [IU] | Freq: Once | INTRAVENOUS | Status: AC | PRN
Start: 2016-05-03 — End: 2016-05-03
  Administered 2016-05-03: 500 [IU]
  Filled 2016-05-03: qty 5

## 2016-05-03 MED ORDER — SODIUM CHLORIDE 0.9% FLUSH
10.0000 mL | INTRAVENOUS | Status: DC | PRN
  Administered 2016-05-03: 10 mL
  Filled 2016-05-03: qty 10

## 2016-05-03 NOTE — Patient Instructions (Signed)

## 2016-05-03 NOTE — Telephone Encounter (Signed)
Patient was presented to scheduling by Alexis/Infusion area, to schedule his appointments. There are no LOS order or Disposition history, for the last time the patient was seen by Dr Irene Limbo on 04/09/16. A message was left on Dr Grier Mitts desk nurse, regarding this matter. The patient is very anxious regarding his unscheduled Chemo.  Will call patient on Monday with update.

## 2016-05-07 ENCOUNTER — Telehealth: Payer: Self-pay | Admitting: Hematology

## 2016-05-07 NOTE — Telephone Encounter (Signed)
sw pt to confirm 12/6 appts date/time per LOS

## 2016-05-15 ENCOUNTER — Other Ambulatory Visit (HOSPITAL_BASED_OUTPATIENT_CLINIC_OR_DEPARTMENT_OTHER): Payer: Medicare Other

## 2016-05-15 ENCOUNTER — Ambulatory Visit (HOSPITAL_BASED_OUTPATIENT_CLINIC_OR_DEPARTMENT_OTHER): Payer: Medicare Other | Admitting: Hematology

## 2016-05-15 ENCOUNTER — Telehealth: Payer: Self-pay | Admitting: Hematology

## 2016-05-15 ENCOUNTER — Ambulatory Visit (HOSPITAL_BASED_OUTPATIENT_CLINIC_OR_DEPARTMENT_OTHER): Payer: Medicare Other

## 2016-05-15 ENCOUNTER — Telehealth: Payer: Self-pay | Admitting: *Deleted

## 2016-05-15 VITALS — BP 131/63 | HR 79 | Temp 97.7°F | Resp 18 | Ht 75.0 in | Wt 226.2 lb

## 2016-05-15 DIAGNOSIS — C184 Malignant neoplasm of transverse colon: Secondary | ICD-10-CM

## 2016-05-15 DIAGNOSIS — I10 Essential (primary) hypertension: Secondary | ICD-10-CM | POA: Diagnosis not present

## 2016-05-15 DIAGNOSIS — Z5111 Encounter for antineoplastic chemotherapy: Secondary | ICD-10-CM

## 2016-05-15 DIAGNOSIS — R627 Adult failure to thrive: Secondary | ICD-10-CM

## 2016-05-15 DIAGNOSIS — D509 Iron deficiency anemia, unspecified: Secondary | ICD-10-CM

## 2016-05-15 DIAGNOSIS — I5032 Chronic diastolic (congestive) heart failure: Secondary | ICD-10-CM

## 2016-05-15 DIAGNOSIS — D500 Iron deficiency anemia secondary to blood loss (chronic): Secondary | ICD-10-CM

## 2016-05-15 DIAGNOSIS — E538 Deficiency of other specified B group vitamins: Secondary | ICD-10-CM

## 2016-05-15 DIAGNOSIS — E119 Type 2 diabetes mellitus without complications: Secondary | ICD-10-CM

## 2016-05-15 DIAGNOSIS — Z9189 Other specified personal risk factors, not elsewhere classified: Secondary | ICD-10-CM

## 2016-05-15 LAB — COMPREHENSIVE METABOLIC PANEL
ALBUMIN: 3.1 g/dL — AB (ref 3.5–5.0)
ALK PHOS: 92 U/L (ref 40–150)
ALT: 12 U/L (ref 0–55)
AST: 15 U/L (ref 5–34)
Anion Gap: 10 mEq/L (ref 3–11)
BILIRUBIN TOTAL: 1.06 mg/dL (ref 0.20–1.20)
BUN: 25.4 mg/dL (ref 7.0–26.0)
CALCIUM: 10 mg/dL (ref 8.4–10.4)
CHLORIDE: 103 meq/L (ref 98–109)
CO2: 26 mEq/L (ref 22–29)
CREATININE: 1.7 mg/dL — AB (ref 0.7–1.3)
EGFR: 43 mL/min/{1.73_m2} — ABNORMAL LOW (ref >90)
Glucose: 275 mg/dl — ABNORMAL HIGH (ref 70–140)
Potassium: 4.4 mEq/L (ref 3.5–5.1)
Sodium: 138 mEq/L (ref 136–145)
TOTAL PROTEIN: 7.2 g/dL (ref 6.4–8.3)

## 2016-05-15 LAB — CBC & DIFF AND RETIC
BASO%: 1 % (ref 0.0–2.0)
BASOS ABS: 0.1 10*3/uL (ref 0.0–0.1)
EOS ABS: 0.1 10*3/uL (ref 0.0–0.5)
EOS%: 1.3 % (ref 0.0–7.0)
HEMATOCRIT: 30.3 % — AB (ref 38.4–49.9)
HGB: 10 g/dL — ABNORMAL LOW (ref 13.0–17.1)
IMMATURE RETIC FRACT: 23.4 % — AB (ref 3.00–10.60)
LYMPH#: 1 10*3/uL (ref 0.9–3.3)
LYMPH%: 21.3 % (ref 14.0–49.0)
MCH: 26.7 pg — ABNORMAL LOW (ref 27.2–33.4)
MCHC: 33 g/dL (ref 32.0–36.0)
MCV: 81 fL (ref 79.3–98.0)
MONO#: 0.5 10*3/uL (ref 0.1–0.9)
MONO%: 10 % (ref 0.0–14.0)
NEUT#: 3.2 10*3/uL (ref 1.5–6.5)
NEUT%: 66.4 % (ref 39.0–75.0)
Platelets: 166 10*3/uL (ref 140–400)
RBC: 3.74 10*6/uL — ABNORMAL LOW (ref 4.20–5.82)
RDW: 18.8 % — AB (ref 11.0–14.6)
RETIC %: 3.2 % — AB (ref 0.80–1.80)
RETIC CT ABS: 119.68 10*3/uL — AB (ref 34.80–93.90)
WBC: 4.8 10*3/uL (ref 4.0–10.3)

## 2016-05-15 MED ORDER — PALONOSETRON HCL INJECTION 0.25 MG/5ML
INTRAVENOUS | Status: AC
  Filled 2016-05-15: qty 5

## 2016-05-15 MED ORDER — PALONOSETRON HCL INJECTION 0.25 MG/5ML
0.2500 mg | Freq: Once | INTRAVENOUS | Status: AC
  Administered 2016-05-15: 0.25 mg via INTRAVENOUS

## 2016-05-15 MED ORDER — SODIUM CHLORIDE 0.9 % IV SOLN
Freq: Once | INTRAVENOUS | Status: AC
  Administered 2016-05-15: 11:00:00 via INTRAVENOUS

## 2016-05-15 MED ORDER — SODIUM CHLORIDE 0.9 % IV SOLN
2400.0000 mg/m2 | INTRAVENOUS | Status: DC
  Administered 2016-05-15: 5850 mg via INTRAVENOUS
  Filled 2016-05-15: qty 117

## 2016-05-15 MED ORDER — FLUOROURACIL CHEMO INJECTION 2.5 GM/50ML
400.0000 mg/m2 | Freq: Once | INTRAVENOUS | Status: AC
Start: 2016-05-15 — End: 2016-05-15
  Administered 2016-05-15: 950 mg via INTRAVENOUS
  Filled 2016-05-15: qty 19

## 2016-05-15 MED ORDER — DEXAMETHASONE SODIUM PHOSPHATE 10 MG/ML IJ SOLN: 10.0000 mg | Freq: Once | INTRAMUSCULAR | Status: DC

## 2016-05-15 MED ORDER — DEXAMETHASONE SODIUM PHOSPHATE 10 MG/ML IJ SOLN
INTRAMUSCULAR | Status: AC
  Filled 2016-05-15: qty 1

## 2016-05-15 MED ORDER — DEXAMETHASONE SODIUM PHOSPHATE 10 MG/ML IJ SOLN
4.0000 mg | Freq: Once | INTRAMUSCULAR | Status: AC
  Administered 2016-05-15: 4 mg via INTRAVENOUS

## 2016-05-15 MED ORDER — SODIUM CHLORIDE 0.9 % IV SOLN
400.0000 mg/m2 | Freq: Once | INTRAVENOUS | Status: AC
  Administered 2016-05-15: 972 mg via INTRAVENOUS
  Filled 2016-05-15: qty 48.6

## 2016-05-15 NOTE — Telephone Encounter (Signed)
Per LOS I have scheduled appts and notified the scheduler 

## 2016-05-15 NOTE — Patient Instructions (Signed)
[facility] Discharge Instructions for Patients Receiving Chemotherapy  Today you received the following chemotherapy agents: Leucovorin and Adrucil  To help prevent nausea and vomiting after your treatment, we encourage you to take your nausea medication as directed.    If you develop nausea and vomiting that is not controlled by your nausea medication, call the clinic.   BELOW ARE SYMPTOMS THAT SHOULD BE REPORTED IMMEDIATELY:  *FEVER GREATER THAN 100.5 F  *CHILLS WITH OR WITHOUT FEVER  NAUSEA AND VOMITING THAT IS NOT CONTROLLED WITH YOUR NAUSEA MEDICATION  *UNUSUAL SHORTNESS OF BREATH  *UNUSUAL BRUISING OR BLEEDING  TENDERNESS IN MOUTH AND THROAT WITH OR WITHOUT PRESENCE OF ULCERS  *URINARY PROBLEMS  *BOWEL PROBLEMS  UNUSUAL RASH Items with * indicate a potential emergency and should be followed up as soon as possible.  Feel free to call the clinic you have any questions or concerns. The clinic phone number is [phone].  Please show the CHEMO ALERT CARD at check-in to the Emergency Department and triage nurse.   

## 2016-05-15 NOTE — Patient Instructions (Signed)
-  Continue oral iron and your vitamin B complex.

## 2016-05-15 NOTE — Progress Notes (Signed)
Okay to treat today with creatinine of 1.7 per Darden Dates, RN, per Dr. Irene Limbo.

## 2016-05-15 NOTE — Progress Notes (Signed)
[facility]    HEMATOLOGY/ONCOLOGY CLINIC NOTE  Date of Service:05/15/2016  Patient Care Team: [name], MD as PCP - General (Endocrinology)  CHIEF COMPLAINTS - follow-up for chemotherapy for colon cancer  Diagnosis: Stage IIIB Colon Cancer diagnosed 11/10/2015  Current treatment  Adjuvant FOLFOX status post 6 cycles. Has subsequently switched to 5-FU leucovorin only due to severe grade 3 fatigue and thrombocytopenia.  Previous treatment -  left hemicolectomy 11/10/2015 - Port-A-Cath placement 12/29/2015  INTERVAL HISTORY  Patient is here for his scheduled follow-up prior to his ninth cycle of adjuvant chemotherapy. He notes that his fatigue has significantly improved he is walking much more and eating much better since his Oxaloplatin was discontinued. He is continuing to get 5-FU leucovorin. Due for his ninth cycle today. Appears to be gaining back some weight. Notes that his blood sugars are better.  MEDICAL HISTORY:  Past Medical History:  Diagnosis Date  . CKD (chronic kidney disease) stage 3, GFR 30-59 ml/min   . Complete heart block ([place])    s. 05/02/2015 s/p BSX U128 Valitude CRT-P Beckie Salts).  [facility] GIB (gastrointestinal bleeding)    a. 10/2015- Hgb 4.6-->8u PRBC's;  b. 10/2015 EGD: non bleeding H pylori + ulceration; c. 10/2015 Colonoscopy: invasive adenocarcinoma.  . History of cardiomyopathy    a. Nonischemic, possibly tachycardia mediated;  b. 04/2015 Echo:  EF 60-65%, no rwma, mild AI/MR, mod dil LA/RA, PASP 73mHg.  .Ernest KitchenHypertension   . Hypertensive heart disease   . OSA (obstructive sleep apnea), pt with apnea during procedures 10/11/2011   Denies  . Paroxysmal atrial fibrillation (HCC)    a. CHA2DS2VASc = 2-3 (Xarelto).  . Stage IIIB Colon Cancer    a. 11/2015 Colonscopy: invasive adenocarcinoma;  b. 11/2015 s/p lap L colectomy; c. Pathology: pT3, pN1c Mx, MSI stable.  . [place] 2 [place]     SURGICAL HISTORY: Past Surgical History:  Procedure Laterality  Date  . CARDIAC CATHETERIZATION    . CARDIOVERSION  10/11/2011   Procedure: CARDIOVERSION;  Surgeon: DLeonie Man MD;  [facility]  Service: Cardiovascular;  Laterality: N/A;  . COLONOSCOPY N/A 11/05/2015   Procedure: COLONOSCOPY;  Surgeon: RRonald Lobo MD;  [facility];  Service: Endoscopy;  Laterality: N/A;  . EP IMPLANTABLE DEVICE N/A 05/02/2015   Procedure: BiV Pacemaker Insertion CRT-P;  Surgeon: GEvans Lance MD;  [facility];  Service: Cardiovascular;  Laterality: N/A;  . ESOPHAGOGASTRODUODENOSCOPY N/A 11/05/2015   Procedure: ESOPHAGOGASTRODUODENOSCOPY (EGD);  Surgeon: RRonald Lobo MD;  [facility];  Service: Endoscopy;  Laterality: N/A;  . EYE SURGERY  2015   left cataract with lens implant  . LAPAROSCOPIC PARTIAL COLECTOMY N/A 11/10/2015   Procedure: LAPAROSCOPIC LEFT COLECTOMY LAPAROSCOPIC MOBILIZATION OF SPLENIC FLEXURE;  Surgeon: EGreer Pickerel MD;  [place] [facility]  Service: General;  Laterality: N/A;  . Nuclear stress  11/01/2011   Severe global hypokinesis,dilated ventricle  . PORTACATH PLACEMENT N/A 12/29/2015   Procedure: INSERTION PORT-A-CATH WITH ULTRASOUND GUIDANCE;  Surgeon: EGreer Pickerel MD;  [facility];  Service: General;  Laterality: N/A;  . TEE WITHOUT CARDIOVERSION  10/10/2011   Procedure: TRANSESOPHAGEAL ECHOCARDIOGRAM (TEE);  Surgeon: MSanda Klein MD;  [facility];  Service: Cardiovascular;  Laterality: N/A;    SOCIAL HISTORY: Social History   Social History  . Marital status: Married    Spouse name: N/A  . Number of children: N/A  . Years of education: N/A   Occupational History  . Not  on file.   Social History Main Topics  . Smoking status: Never Smoker  . Smokeless tobacco: Never Used  . Alcohol use No  . Drug use: No  . Sexual activity: Not Currently   Other Topics Concern  . Not on file   Social History Narrative  . No narrative on file    FAMILY HISTORY: Family History    Problem Relation Age of Onset  . Colon cancer Mother   . Kidney disease Father     ESRF/HD, deceased    ALLERGIES:  is allergic to amiodarone and allopurinol.  MEDICATIONS:  Current Outpatient Prescriptions  Medication Sig Dispense Refill  . benazepril (LOTENSIN) 40 MG tablet TAKE 1 TABLET (40 MG TOTAL) BY MOUTH DAILY. 30 tablet 6  . carvedilol (COREG) 12.5 MG tablet Take 1 tablet (12.5 mg total) by mouth 2 (two) times daily with a meal. 60 tablet 2  . chlorhexidine (PERIDEX) 0.12 % solution Use as directed 15 mLs in the mouth or throat 2 (two) times daily. 473 mL 0  . dexamethasone (DECADRON) 4 MG tablet Take 2 tablets (8 mg total) by mouth daily. Start the day after chemotherapy for 2 days. Take with food. (Patient not taking: Reported on 04/09/2016) 30 tablet 1  . doxazosin (CARDURA) 8 MG tablet TAKE 1 TABLET (8 MG TOTAL) BY MOUTH AT BEDTIME. 30 tablet 9  . ferrous sulfate 325 (65 FE) MG EC tablet Take 1 tablet (325 mg total) by mouth daily with breakfast. 30 tablet 0  . furosemide (LASIX) 80 MG tablet TAKE 1 TABLET (80 MG TOTAL) BY MOUTH DAILY. 60 tablet 6  . glipiZIDE (GLUCOTROL) 5 MG tablet Take 5 mg by mouth 2 (two) times daily before a meal.     . lidocaine-prilocaine (EMLA) cream Apply to affected area once 30 g 3  . ondansetron (ZOFRAN) 8 MG tablet Take 1 tablet (8 mg total) by mouth 2 (two) times daily as needed for refractory nausea / vomiting. Start on day 3 after chemotherapy. 30 tablet 1  . potassium chloride (KLOR-CON M10) 10 MEQ tablet Take 1 tablet (10 mEq total) by mouth every other day. 15 tablet 11  . prochlorperazine (COMPAZINE) 10 MG tablet Take 1 tablet (10 mg total) by mouth every 6 (six) hours as needed (Nausea or vomiting). (Patient not taking: Reported on 04/09/2016) 30 tablet 1  . rosuvastatin (CRESTOR) 20 MG tablet Take 20 mg by mouth every evening.     . sitaGLIPtin (JANUVIA) 100 MG tablet Take 100 mg by mouth daily.    . SUPER B COMPLEX & C TABS TAKE 1  CAPSULE BY MOUTH DAILY. 100 tablet 0  . UNABLE TO FIND Glucometer - 1  Lancets - 100  Test strips - 100  Alcohol pads - 100 1 each 0  . XARELTO 20 MG TABS tablet TAKE 1 TABLET BY MOUTH DAILY (Patient taking differently: TAKE 1 TABLET BY MOUTH EVERY EVENING) 30 tablet 5   No current facility-administered medications for this visit.     REVIEW OF SYSTEMS:    10 Point review of Systems was done is negative except as noted above.  PHYSICAL EXAMINATION: ECOG PERFORMANCE STATUS: 2 - Symptomatic, <50% confined to bed  . Vitals:   05/15/16 1007  BP: 131/63  Pulse: 79  Resp: 18  Temp: 97.7 F (36.5 C)   Filed Weights   05/15/16 1007  Weight: 226 lb 3.2 oz (102.6 kg)   .Body mass index is 28.27 kg/m.   [facility] Wt  Readings from Last 3 Encounters:  05/15/16 226 lb 3.2 oz (102.6 kg)  04/09/16 221 lb (100.2 kg)  04/09/16 221 lb 4.8 oz (100.4 kg)   GENERAL:alert, in no acute distress and comfortable SKIN: skin color, texture, turgor are normal, no rashes or significant lesions EYES: normal, conjunctiva are pink and non-injected, sclera clear OROPHARYNX:no exudate, no erythema and lips, buccal mucosa, and tongue normal  NECK: supple, no JVD, thyroid normal size, non-tender, without nodularity LYMPH:  no palpable lymphadenopathy in the cervical, axillary or inguinal LUNGS: clear to auscultation with normal respiratory effort HEART: regular rate & rhythm,  no murmurs and no lower extremity edema ABDOMEN: Abdominal surgical incision has healed with minimal superficial crusting remaining . Musculoskeletal: no cyanosis of digits and no clubbing  PSYCH: alert & oriented x 3 with fluent speech NEURO: no focal motor/sensory deficits  LABORATORY DATA:  I have reviewed the data as listed . CBC Latest Ref Rng & Units 05/15/2016 05/01/2016 04/17/2016  WBC 4.0 - 10.3 10e3/uL 4.8 4.7 6.8  Hemoglobin 13.0 - 17.1 g/dL 10.0(L) 10.1(L) 9.4(L)  Hematocrit 38.4 - 49.9 % 30.3(L) 31.0(L) 28.7(L)    Platelets 140 - 400 10e3/uL 166 155 245   . CMP Latest Ref Rng & Units 05/01/2016 04/17/2016 04/10/2016  Glucose 70 - 140 mg/dl 242(H) 168(H) 210(H)  BUN 7.0 - 26.0 mg/dL 21.8 16.2 29(H)  Creatinine 0.7 - 1.3 mg/dL 1.7(H) 1.4(H) 1.99(H)  Sodium 136 - 145 mEq/L 139 137 136  Potassium 3.5 - 5.1 mEq/L 5.0 3.9 3.4(L)  Chloride 101 - 111 mmol/L - - 103  CO2 22 - 29 mEq/L 26 28 25   Calcium 8.4 - 10.4 mg/dL 10.3 10.6(H) 10.0  Total Protein 6.4 - 8.3 g/dL 7.4 7.3 7.0  Total Bilirubin 0.20 - 1.20 mg/dL 1.05 0.96 1.8(H)  Alkaline Phos 40 - 150 U/L 89 92 91  AST 5 - 34 U/L 18 24 68(H)  ALT 0 - 55 U/L 15 25 59     RADIOGRAPHIC STUDIES: I have personally reviewed the radiological images as listed and agreed with the findings in the report. No results found.  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 Patient is s/p left sided colectomy on 11/10/2015 .  Patient is status post 6 cycles of FOLFOX Received 5-FU leucovorin for cycle 7 and 8 and tolerated this much better without significant thrombocytopenia or fatigue. 2) fatigue-significantly improved since discontinuation of oxaliplatin. Was affecting the patient's quality of life - this appears to be multifactorial the patient's chemotherapy , uncontrolled diabetes and poor oral intake and multiple other medical comorbidities . His fatigue is minimal at this time and he is eating much better and much more physically active. He is glad about how he is feeling now. 3) Abnormal LFTs -likely from chemotherapy. resolved 4 hypercalcemia likely due to dehydration. Resolved  5) Symptomatic anemia - Improved . 6)  thrombocytopenia likely related to chemotherapy.Today at 166 k 7) Microcytic Anemia due to Iron deficiency.  Hgb improving. B12 was borderline low- improved now. Post-operative CEA WNL  Plan -Patient's blood counts are stable, awaiting -If  chemistries is stable we will proceed with his ninth cycle of adjuvant chemotherapy with 5-FU /leucovorin. Oxaloplatin has been discontinued for his remaining cycles. -No other acute new prohibitive toxicities at this time I encouraged him to maintain good physical activity level and continue follow-up with primary care physician for continued monitoring and  optimization of his high blood pressure and diabetes. - Continue oral iron and B12 replacement  8) HTN - controlled.  9) DM2- uncontrolled  10) Non ischemic chronic diastolic CHF 11) P. a fib on Xarelto per cardiology - will need to be mindful of his renal function and watch for bleeding. 12) CKD 3 monitoring kidney function - is also on ACE inhibitor as per primary care physician and diuretics which will need to be monitored closely. 14) Duodenal ulceration (h pylori -positive) - completed Abx for rx. On PPI  PLAN -continue f/u with PCP for management of other medical problems.    return to care with Dr. Irene Limbo in 2 weeks with repeat labs  I spent 20 minutes counseling the patient face to face. The total time spent in the appointment was 25 minutes and more than 50% was on counseling and direct patient cares in coordination with the hospitalist for inpatient hospitalization.    Sullivan Lone MD [place] AAHIVMS [facility] Hematology/Oncology Physician [facility]  (Office):       [phone] (Work cell):  [phone] (Fax):           [phone]

## 2016-05-15 NOTE — Telephone Encounter (Signed)
Appointments scheduled per 12/6 LOS. Patient given AVS report and calendars with future scheduled appointments. °

## 2016-05-17 ENCOUNTER — Ambulatory Visit (HOSPITAL_BASED_OUTPATIENT_CLINIC_OR_DEPARTMENT_OTHER): Payer: Medicare Other

## 2016-05-17 VITALS — BP 130/64 | HR 73 | Temp 98.2°F | Resp 18

## 2016-05-17 DIAGNOSIS — Z452 Encounter for adjustment and management of vascular access device: Secondary | ICD-10-CM

## 2016-05-17 DIAGNOSIS — C184 Malignant neoplasm of transverse colon: Secondary | ICD-10-CM

## 2016-05-17 MED ORDER — SODIUM CHLORIDE 0.9% FLUSH
10.0000 mL | INTRAVENOUS | Status: DC | PRN
Start: 2016-05-17 — End: 2016-05-17
  Administered 2016-05-17: 10 mL
  Filled 2016-05-17: qty 10

## 2016-05-17 MED ORDER — HEPARIN SOD (PORK) LOCK FLUSH 100 UNIT/ML IV SOLN
500.0000 [IU] | Freq: Once | INTRAVENOUS | Status: AC | PRN
  Administered 2016-05-17: 500 [IU]
  Filled 2016-05-17: qty 5

## 2016-05-17 NOTE — Patient Instructions (Signed)

## 2016-05-25 ENCOUNTER — Other Ambulatory Visit: Payer: Self-pay | Admitting: Cardiovascular Disease

## 2016-05-29 ENCOUNTER — Ambulatory Visit (HOSPITAL_BASED_OUTPATIENT_CLINIC_OR_DEPARTMENT_OTHER): Payer: Medicare Other | Admitting: Hematology

## 2016-05-29 ENCOUNTER — Other Ambulatory Visit (HOSPITAL_BASED_OUTPATIENT_CLINIC_OR_DEPARTMENT_OTHER): Payer: Medicare Other

## 2016-05-29 ENCOUNTER — Telehealth: Payer: Self-pay | Admitting: *Deleted

## 2016-05-29 ENCOUNTER — Telehealth: Payer: Self-pay | Admitting: Hematology

## 2016-05-29 ENCOUNTER — Ambulatory Visit (HOSPITAL_BASED_OUTPATIENT_CLINIC_OR_DEPARTMENT_OTHER): Payer: Medicare Other

## 2016-05-29 VITALS — BP 128/72 | HR 83 | Temp 97.7°F | Resp 19 | Wt 232.0 lb

## 2016-05-29 DIAGNOSIS — Z5111 Encounter for antineoplastic chemotherapy: Secondary | ICD-10-CM | POA: Diagnosis not present

## 2016-05-29 DIAGNOSIS — R627 Adult failure to thrive: Secondary | ICD-10-CM | POA: Diagnosis not present

## 2016-05-29 DIAGNOSIS — D696 Thrombocytopenia, unspecified: Secondary | ICD-10-CM | POA: Diagnosis not present

## 2016-05-29 DIAGNOSIS — C184 Malignant neoplasm of transverse colon: Secondary | ICD-10-CM

## 2016-05-29 DIAGNOSIS — I10 Essential (primary) hypertension: Secondary | ICD-10-CM

## 2016-05-29 DIAGNOSIS — D509 Iron deficiency anemia, unspecified: Secondary | ICD-10-CM

## 2016-05-29 DIAGNOSIS — Z9189 Other specified personal risk factors, not elsewhere classified: Secondary | ICD-10-CM

## 2016-05-29 DIAGNOSIS — E119 Type 2 diabetes mellitus without complications: Secondary | ICD-10-CM

## 2016-05-29 LAB — CBC & DIFF AND RETIC
BASO%: 0.6 % (ref 0.0–2.0)
BASOS ABS: 0 10*3/uL (ref 0.0–0.1)
EOS ABS: 0.1 10*3/uL (ref 0.0–0.5)
EOS%: 1.9 % (ref 0.0–7.0)
HEMATOCRIT: 31.3 % — AB (ref 38.4–49.9)
HEMOGLOBIN: 10.3 g/dL — AB (ref 13.0–17.1)
Immature Retic Fract: 17.2 % — ABNORMAL HIGH (ref 3.00–10.60)
LYMPH#: 1 10*3/uL (ref 0.9–3.3)
LYMPH%: 18.1 % (ref 14.0–49.0)
MCH: 27 pg — ABNORMAL LOW (ref 27.2–33.4)
MCHC: 32.9 g/dL (ref 32.0–36.0)
MCV: 81.9 fL (ref 79.3–98.0)
MONO#: 0.5 10*3/uL (ref 0.1–0.9)
MONO%: 9.3 % (ref 0.0–14.0)
NEUT#: 3.7 10*3/uL (ref 1.5–6.5)
NEUT%: 70.1 % (ref 39.0–75.0)
PLATELETS: 148 10*3/uL (ref 140–400)
RBC: 3.82 10*6/uL — AB (ref 4.20–5.82)
RDW: 18.9 % — ABNORMAL HIGH (ref 11.0–14.6)
RETIC CT ABS: 103.52 10*3/uL — AB (ref 34.80–93.90)
Retic %: 2.71 % — ABNORMAL HIGH (ref 0.80–1.80)
WBC: 5.3 10*3/uL (ref 4.0–10.3)

## 2016-05-29 LAB — COMPREHENSIVE METABOLIC PANEL
ALBUMIN: 3.3 g/dL — AB (ref 3.5–5.0)
ALK PHOS: 86 U/L (ref 40–150)
ALT: 11 U/L (ref 0–55)
ANION GAP: 9 meq/L (ref 3–11)
AST: 14 U/L (ref 5–34)
BILIRUBIN TOTAL: 0.94 mg/dL (ref 0.20–1.20)
BUN: 21 mg/dL (ref 7.0–26.0)
CALCIUM: 9.6 mg/dL (ref 8.4–10.4)
CO2: 25 meq/L (ref 22–29)
CREATININE: 1.6 mg/dL — AB (ref 0.7–1.3)
Chloride: 104 mEq/L (ref 98–109)
EGFR: 47 mL/min/{1.73_m2} — AB (ref >90)
Glucose: 207 mg/dl — ABNORMAL HIGH (ref 70–140)
Potassium: 4.1 mEq/L (ref 3.5–5.1)
Sodium: 138 mEq/L (ref 136–145)
TOTAL PROTEIN: 6.9 g/dL (ref 6.4–8.3)

## 2016-05-29 MED ORDER — DEXAMETHASONE SODIUM PHOSPHATE 10 MG/ML IJ SOLN
INTRAMUSCULAR | Status: AC
  Filled 2016-05-29: qty 1

## 2016-05-29 MED ORDER — HEPARIN SOD (PORK) LOCK FLUSH 100 UNIT/ML IV SOLN
500.0000 [IU] | Freq: Once | INTRAVENOUS | Status: DC | PRN
  Filled 2016-05-29: qty 5

## 2016-05-29 MED ORDER — SODIUM CHLORIDE 0.9 % IV SOLN
Freq: Once | INTRAVENOUS | Status: AC
  Administered 2016-05-29: 11:00:00 via INTRAVENOUS

## 2016-05-29 MED ORDER — SODIUM CHLORIDE 0.9 % IV SOLN
400.0000 mg/m2 | Freq: Once | INTRAVENOUS | Status: AC
  Administered 2016-05-29: 972 mg via INTRAVENOUS
  Filled 2016-05-29: qty 48.6

## 2016-05-29 MED ORDER — SODIUM CHLORIDE 0.9% FLUSH
10.0000 mL | INTRAVENOUS | Status: DC | PRN
  Filled 2016-05-29: qty 10

## 2016-05-29 MED ORDER — FLUOROURACIL CHEMO INJECTION 2.5 GM/50ML
400.0000 mg/m2 | Freq: Once | INTRAVENOUS | Status: AC
  Administered 2016-05-29: 950 mg via INTRAVENOUS
  Filled 2016-05-29: qty 19

## 2016-05-29 MED ORDER — SODIUM CHLORIDE 0.9 % IV SOLN
2400.0000 mg/m2 | INTRAVENOUS | Status: DC
  Administered 2016-05-29: 5850 mg via INTRAVENOUS
  Filled 2016-05-29: qty 117

## 2016-05-29 MED ORDER — PALONOSETRON HCL INJECTION 0.25 MG/5ML
0.2500 mg | Freq: Once | INTRAVENOUS | Status: AC
  Administered 2016-05-29: 0.25 mg via INTRAVENOUS

## 2016-05-29 MED ORDER — DEXAMETHASONE SODIUM PHOSPHATE 10 MG/ML IJ SOLN
4.0000 mg | Freq: Once | INTRAMUSCULAR | Status: AC
  Administered 2016-05-29: 4 mg via INTRAVENOUS

## 2016-05-29 MED ORDER — PALONOSETRON HCL INJECTION 0.25 MG/5ML
INTRAVENOUS | Status: AC
  Filled 2016-05-29: qty 5

## 2016-05-29 NOTE — Telephone Encounter (Signed)
Per LOS I have scheduled appts and gave the patient a calendar  

## 2016-05-29 NOTE — Patient Instructions (Signed)
[facility] Discharge Instructions for Patients Receiving Chemotherapy  Today you received the following chemotherapy agents: Leucovorin and 5FU.  To help prevent nausea and vomiting after your treatment, we encourage you to take your nausea medication: as directed.   If you develop nausea and vomiting that is not controlled by your nausea medication, call the clinic.   BELOW ARE SYMPTOMS THAT SHOULD BE REPORTED IMMEDIATELY:  *FEVER GREATER THAN 100.5 F  *CHILLS WITH OR WITHOUT FEVER  NAUSEA AND VOMITING THAT IS NOT CONTROLLED WITH YOUR NAUSEA MEDICATION  *UNUSUAL SHORTNESS OF BREATH  *UNUSUAL BRUISING OR BLEEDING  TENDERNESS IN MOUTH AND THROAT WITH OR WITHOUT PRESENCE OF ULCERS  *URINARY PROBLEMS  *BOWEL PROBLEMS  UNUSUAL RASH Items with * indicate a potential emergency and should be followed up as soon as possible.  Feel free to call the clinic you have any questions or concerns. The clinic phone number is [phone].  Please show the CHEMO ALERT CARD at check-in to the Emergency Department and triage nurse.   

## 2016-05-29 NOTE — Telephone Encounter (Signed)
Message sent to chemo scheduler to be added per 05/29/16 los. Appointments scheduled per 05/29/16 los. A copy of the appointment schedule and AVS report was given to the patient, per 05/29/16 los.

## 2016-05-31 ENCOUNTER — Ambulatory Visit (HOSPITAL_BASED_OUTPATIENT_CLINIC_OR_DEPARTMENT_OTHER): Payer: Medicare Other

## 2016-05-31 VITALS — BP 144/62 | HR 69 | Temp 98.0°F | Resp 18

## 2016-05-31 DIAGNOSIS — C184 Malignant neoplasm of transverse colon: Secondary | ICD-10-CM

## 2016-05-31 DIAGNOSIS — Z452 Encounter for adjustment and management of vascular access device: Secondary | ICD-10-CM | POA: Diagnosis not present

## 2016-05-31 MED ORDER — HEPARIN SOD (PORK) LOCK FLUSH 100 UNIT/ML IV SOLN
500.0000 [IU] | Freq: Once | INTRAVENOUS | Status: AC | PRN
  Administered 2016-05-31: 500 [IU]
  Filled 2016-05-31: qty 5

## 2016-05-31 MED ORDER — SODIUM CHLORIDE 0.9% FLUSH
10.0000 mL | INTRAVENOUS | Status: DC | PRN
  Administered 2016-05-31: 10 mL
  Filled 2016-05-31: qty 10

## 2016-05-31 NOTE — Patient Instructions (Signed)

## 2016-06-04 NOTE — Progress Notes (Signed)
Ernest Lara    HEMATOLOGY/ONCOLOGY CLINIC NOTE  Date of Service:05/29/2016  Patient Care Team: [name], MD as PCP - General (Endocrinology)  CHIEF COMPLAINTS - follow-up for chemotherapy for colon cancer  Diagnosis: Stage IIIB Colon Cancer diagnosed 11/10/2015  Current treatment  Adjuvant FOLFOX status post 6 cycles. Has subsequently switched to 5-FU leucovorin only due to severe grade 3 fatigue and thrombocytopenia and has completed a total treatment of 9/12 cycles.  Previous treatment -  left hemicolectomy 11/10/2015 - Port-A-Cath placement 12/29/2015  INTERVAL HISTORY  Patient is here for his scheduled follow-up prior to his 10th cycle of adjuvant chemotherapy for colon cancer.Ernest Lara He notes that his fatigue has significantly improved he is walking much more and eating much better since his Oxaloplatin was discontinued. He is continuing to get 5-FU leucovorin. Due for his 10 cycle today. He notes that after some contemplation he has decided to complete the remaining 2 cycles of ctx after this one.. Notes that his blood sugars are better. Has been eating well and has been gaining his weight back.  MEDICAL HISTORY:  Past Medical History:  Diagnosis Date  . CKD (chronic kidney disease) stage 3, GFR 30-59 ml/min   . Complete heart block ([place])    s. 05/02/2015 s/p BSX U128 [place] CRT-P Beckie Salts).  Ernest Lara GIB (gastrointestinal bleeding)    a. 10/2015- Hgb 4.6-->8u PRBC's;  b. 10/2015 EGD: non bleeding H pylori + ulceration; c. 10/2015 Colonoscopy: invasive adenocarcinoma.  . History of cardiomyopathy    a. Nonischemic, possibly tachycardia mediated;  b. 04/2015 Echo:  EF 60-65%, no rwma, mild AI/MR, mod dil LA/RA, PASP 59mHg.  .Ernest KitchenHypertension   . Hypertensive heart disease   . OSA (obstructive sleep apnea), pt with apnea during procedures 10/11/2011   Denies  . Paroxysmal atrial fibrillation (HCC)    a. CHA2DS2VASc = 2-3 (Xarelto).  . Stage IIIB Colon Cancer    a. 11/2015 Colonscopy: invasive  adenocarcinoma;  b. 11/2015 s/p lap L colectomy; c. Pathology: pT3, pN1c Mx, MSI stable.  . [place] 2 [place]     SURGICAL HISTORY: Past Surgical History:  Procedure Laterality Date  . CARDIAC CATHETERIZATION    . CARDIOVERSION  10/11/2011   Procedure: CARDIOVERSION;  Surgeon: DLeonie Man MD;  [place]  Service: Cardiovascular;  Laterality: N/A;  . COLONOSCOPY N/A 11/05/2015   Procedure: COLONOSCOPY;  Surgeon: RRonald Lobo MD;  [facility];  Service: Endoscopy;  Laterality: N/A;  . EP IMPLANTABLE DEVICE N/A 05/02/2015   Procedure: BiV Pacemaker Insertion CRT-P;  Surgeon: GEvans Lance MD;  [place];  Service: Cardiovascular;  Laterality: N/A;  . ESOPHAGOGASTRODUODENOSCOPY N/A 11/05/2015   Procedure: ESOPHAGOGASTRODUODENOSCOPY (EGD);  Surgeon: RRonald Lobo MD;  [facility];  Service: Endoscopy;  Laterality: N/A;  . EYE SURGERY  2015   left cataract with lens implant  . LAPAROSCOPIC PARTIAL COLECTOMY N/A 11/10/2015   Procedure: LAPAROSCOPIC LEFT COLECTOMY LAPAROSCOPIC MOBILIZATION OF SPLENIC FLEXURE;  Surgeon: EGreer Pickerel MD;  [place]  Service: General;  Laterality: N/A;  . Nuclear stress  11/01/2011   Severe global hypokinesis,dilated ventricle  . PORTACATH PLACEMENT N/A 12/29/2015   Procedure: INSERTION PORT-A-CATH WITH ULTRASOUND GUIDANCE;  Surgeon: EGreer Pickerel MD;  [facility];  Service: General;  Laterality: N/A;  . TEE WITHOUT CARDIOVERSION  10/10/2011   Procedure: TRANSESOPHAGEAL ECHOCARDIOGRAM (TEE);  Surgeon: MSanda Klein MD;  [place]  Service: Cardiovascular;  Laterality: N/A;    SOCIAL HISTORY:  Social History   Social History  . Marital status: Married    Spouse name: N/A  . Number of children: N/A  . Years of education: N/A   Occupational History  . Not on file.   Social History Main Topics  . Smoking status: Never Smoker  . Smokeless tobacco: Never Used  . Alcohol  use No  . Drug use: No  . Sexual activity: Not Currently   Other Topics Concern  . Not on file   Social History Narrative  . No narrative on file    FAMILY HISTORY: Family History  Problem Relation Age of Onset  . Colon cancer Mother   . Kidney disease Father     ESRF/HD, deceased    ALLERGIES:  is allergic to amiodarone and allopurinol.  MEDICATIONS:  Current Outpatient Prescriptions  Medication Sig Dispense Refill  . benazepril (LOTENSIN) 40 MG tablet TAKE 1 TABLET (40 MG TOTAL) BY MOUTH DAILY. 30 tablet 6  . carvedilol (COREG) 12.5 MG tablet Take 1 tablet (12.5 mg total) by mouth 2 (two) times daily with a meal. 60 tablet 2  . chlorhexidine (PERIDEX) 0.12 % solution Use as directed 15 mLs in the mouth or throat 2 (two) times daily. 473 mL 0  . dexamethasone (DECADRON) 4 MG tablet Take 2 tablets (8 mg total) by mouth daily. Start the day after chemotherapy for 2 days. Take with food. (Patient not taking: Reported on 04/09/2016) 30 tablet 1  . doxazosin (CARDURA) 8 MG tablet TAKE 1 TABLET (8 MG TOTAL) BY MOUTH AT BEDTIME. 30 tablet 9  . ferrous sulfate 325 (65 FE) MG EC tablet Take 1 tablet (325 mg total) by mouth daily with breakfast. 30 tablet 0  . furosemide (LASIX) 80 MG tablet TAKE 1 TABLET (80 MG TOTAL) BY MOUTH DAILY. 60 tablet 6  . lidocaine-prilocaine (EMLA) cream Apply to affected area once 30 g 3  . ondansetron (ZOFRAN) 8 MG tablet Take 1 tablet (8 mg total) by mouth 2 (two) times daily as needed for refractory nausea / vomiting. Start on day 3 after chemotherapy. 30 tablet 1  . potassium chloride (KLOR-CON M10) 10 MEQ tablet Take 1 tablet (10 mEq total) by mouth every other day. 15 tablet 11  . prochlorperazine (COMPAZINE) 10 MG tablet Take 1 tablet (10 mg total) by mouth every 6 (six) hours as needed (Nausea or vomiting). (Patient not taking: Reported on 04/09/2016) 30 tablet 1  . rosuvastatin (CRESTOR) 20 MG tablet Take 20 mg by mouth every evening.     .  sitaGLIPtin (JANUVIA) 100 MG tablet Take 100 mg by mouth daily.    . SUPER B COMPLEX & C TABS TAKE 1 CAPSULE BY MOUTH DAILY. 100 tablet 0  . UNABLE TO FIND Glucometer - 1  Lancets - 100  Test strips - 100  Alcohol pads - 100 1 each 0  . XARELTO 20 MG TABS tablet TAKE 1 TABLET BY MOUTH DAILY 30 tablet 5   No current facility-administered medications for this visit.     REVIEW OF SYSTEMS:    10 Point review of Systems was done is negative except as noted above.  PHYSICAL EXAMINATION: ECOG PERFORMANCE STATUS: 2 - Symptomatic, <50% confined to bed  . Vitals:   05/29/16 0952  BP: 128/72  Pulse: 83  Resp: 19  Temp: 97.7 F (36.5 C)   Filed Weights   05/29/16 0952  Weight: 232 lb (105.2 kg)   .Body mass index is 29 kg/m.   Ernest Lara  Wt Readings from Last 3 Encounters:  05/29/16 232 lb (105.2 kg)  05/15/16 226 lb 3.2 oz (102.6 kg)  04/09/16 221 lb (100.2 kg)   GENERAL:alert, in no acute distress and comfortable SKIN: skin color, texture, turgor are normal, no rashes or significant lesions EYES: normal, conjunctiva are pink and non-injected, sclera clear OROPHARYNX:no exudate, no erythema and lips, buccal mucosa, and tongue normal  NECK: supple, no JVD, thyroid normal size, non-tender, without nodularity LYMPH:  no palpable lymphadenopathy in the cervical, axillary or inguinal LUNGS: clear to auscultation with normal respiratory effort HEART: regular rate & rhythm,  no murmurs and no lower extremity edema ABDOMEN: Abdominal surgical incision has healed with minimal superficial crusting remaining . Musculoskeletal: no cyanosis of digits and no clubbing  PSYCH: alert & oriented x 3 with fluent speech NEURO: no focal motor/sensory deficits  LABORATORY DATA:  I have reviewed the data as listed . CBC Latest Ref Rng & Units 05/29/2016 05/15/2016 05/01/2016  WBC 4.0 - 10.3 10e3/uL 5.3 4.8 4.7  Hemoglobin 13.0 - 17.1 g/dL 10.3(L) 10.0(L) 10.1(L)  Hematocrit 38.4 - 49.9 % 31.3(L)  30.3(L) 31.0(L)  Platelets 140 - 400 10e3/uL 148 166 155   . CMP Latest Ref Rng & Units 05/29/2016 05/15/2016 05/01/2016  Glucose 70 - 140 mg/dl 207(H) 275(H) 242(H)  BUN 7.0 - 26.0 mg/dL 21.0 25.4 21.8  Creatinine 0.7 - 1.3 mg/dL 1.6(H) 1.7(H) 1.7(H)  Sodium 136 - 145 mEq/L 138 138 139  Potassium 3.5 - 5.1 mEq/L 4.1 4.4 5.0  Chloride 101 - 111 mmol/L - - -  CO2 22 - 29 mEq/L 25 26 26   Calcium 8.4 - 10.4 mg/dL 9.6 10.0 10.3  Total Protein 6.4 - 8.3 g/dL 6.9 7.2 7.4  Total Bilirubin 0.20 - 1.20 mg/dL 0.94 1.06 1.05  Alkaline Phos 40 - 150 U/L 86 92 89  AST 5 - 34 U/L 14 15 18   ALT 0 - 55 U/L 11 12 15      RADIOGRAPHIC STUDIES: I have personally reviewed the radiological images as listed and agreed with the findings in the report. No results found.  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 Patient is s/p left sided colectomy on 11/10/2015 .  Patient is status post 6 cycles of FOLFOX Received 5-FU leucovorin for cycle 7-9 and tolerated this much better without significant thrombocytopenia or fatigue. 2) fatigue-significantly improved since discontinuation of oxaliplatin. Was affecting the patient's quality of life - this appears to be multifactorial the patient's chemotherapy , uncontrolled diabetes and poor oral intake and multiple other medical comorbidities . His fatigue is minimal at this time and he is eating much better and much more physically active. He is glad about how he is feeling now. 3) Abnormal LFTs -likely from chemotherapy. resolved 4 hypercalcemia likely due to dehydration. Resolved  5) Symptomatic anemia - Improved . 6)  thrombocytopenia likely related to chemotherapy.Today at 148 k 7) Microcytic Anemia due to Iron deficiency and chemotherapy Hgb improving. B12 was borderline low- improved now. Post-operative CEA WNL  Plan -Patient's blood counts are stable - we will  proceed with his 10th cycle of adjuvant chemotherapy with 5-FU /leucovorin. Oxaloplatin has been discontinued for his remaining cycles. -No other acute new prohibitive toxicities at this time I encouraged him to maintain good physical activity level and continue follow-up with primary care physician for continued monitoring and optimization of his high blood pressure and  diabetes. - Continue oral iron and B12 replacement  8) HTN - controlled.  9) DM2- uncontrolled  10) Non ischemic chronic diastolic CHF 11) P. a fib on Xarelto per cardiology - will need to be mindful of his renal function and watch for bleeding. 12) CKD 3 monitoring kidney function - is also on ACE inhibitor as per primary care physician and diuretics which will need to be monitored closely. 14) Duodenal ulceration (h pylori -positive) - completed Abx for rx. On PPI  PLAN -continue f/u with PCP for management of other medical problems.  Continue treatment as per schedule with labs (2 cycles remaining after today) RTC with Dr Irene Limbo C12D1 with labs   I spent 20 minutes counseling the patient face to face. The total time spent in the appointment was 25 minutes and more than 50% was on counseling and direct patient cares    Sullivan Lone MD [place] AAHIVMS [facility] Hematology/Oncology Physician [facility]  (Office):       [phone] (Work cell):  [phone] (Fax):           [phone]

## 2016-06-11 ENCOUNTER — Ambulatory Visit (INDEPENDENT_AMBULATORY_CARE_PROVIDER_SITE_OTHER): Payer: Medicare Other | Admitting: *Deleted

## 2016-06-11 DIAGNOSIS — I442 Atrioventricular block, complete: Secondary | ICD-10-CM

## 2016-06-12 ENCOUNTER — Other Ambulatory Visit (HOSPITAL_BASED_OUTPATIENT_CLINIC_OR_DEPARTMENT_OTHER): Payer: Medicare Other

## 2016-06-12 ENCOUNTER — Ambulatory Visit (HOSPITAL_BASED_OUTPATIENT_CLINIC_OR_DEPARTMENT_OTHER): Payer: Medicare Other

## 2016-06-12 VITALS — BP 134/73 | HR 69 | Temp 97.8°F | Resp 16

## 2016-06-12 DIAGNOSIS — Z9189 Other specified personal risk factors, not elsewhere classified: Secondary | ICD-10-CM

## 2016-06-12 DIAGNOSIS — Z5111 Encounter for antineoplastic chemotherapy: Secondary | ICD-10-CM | POA: Diagnosis not present

## 2016-06-12 DIAGNOSIS — D509 Iron deficiency anemia, unspecified: Secondary | ICD-10-CM

## 2016-06-12 DIAGNOSIS — C184 Malignant neoplasm of transverse colon: Secondary | ICD-10-CM

## 2016-06-12 LAB — COMPREHENSIVE METABOLIC PANEL
ALK PHOS: 102 U/L (ref 40–150)
ALT: 10 U/L (ref 0–55)
ANION GAP: 11 meq/L (ref 3–11)
AST: 16 U/L (ref 5–34)
Albumin: 3.7 g/dL (ref 3.5–5.0)
BILIRUBIN TOTAL: 0.95 mg/dL (ref 0.20–1.20)
BUN: 22.2 mg/dL (ref 7.0–26.0)
CALCIUM: 10.4 mg/dL (ref 8.4–10.4)
CO2: 28 meq/L (ref 22–29)
CREATININE: 1.7 mg/dL — AB (ref 0.7–1.3)
Chloride: 103 mEq/L (ref 98–109)
EGFR: 44 mL/min/{1.73_m2} — AB (ref >90)
Glucose: 240 mg/dl — ABNORMAL HIGH (ref 70–140)
Potassium: 4.6 mEq/L (ref 3.5–5.1)
Sodium: 141 mEq/L (ref 136–145)
TOTAL PROTEIN: 7.4 g/dL (ref 6.4–8.3)

## 2016-06-12 LAB — CBC & DIFF AND RETIC
BASO%: 0.6 % (ref 0.0–2.0)
BASOS ABS: 0 10*3/uL (ref 0.0–0.1)
EOS%: 2.3 % (ref 0.0–7.0)
Eosinophils Absolute: 0.1 10*3/uL (ref 0.0–0.5)
HEMATOCRIT: 32.8 % — AB (ref 38.4–49.9)
HGB: 10.9 g/dL — ABNORMAL LOW (ref 13.0–17.1)
IMMATURE RETIC FRACT: 13.9 % — AB (ref 3.00–10.60)
LYMPH%: 21.3 % (ref 14.0–49.0)
MCH: 27 pg — ABNORMAL LOW (ref 27.2–33.4)
MCHC: 33.2 g/dL (ref 32.0–36.0)
MCV: 81.4 fL (ref 79.3–98.0)
MONO#: 0.4 10*3/uL (ref 0.1–0.9)
MONO%: 8.9 % (ref 0.0–14.0)
NEUT#: 3.2 10*3/uL (ref 1.5–6.5)
NEUT%: 66.9 % (ref 39.0–75.0)
PLATELETS: 159 10*3/uL (ref 140–400)
RBC: 4.03 10*6/uL — ABNORMAL LOW (ref 4.20–5.82)
RDW: 18.1 % — ABNORMAL HIGH (ref 11.0–14.6)
Retic %: 2.6 % — ABNORMAL HIGH (ref 0.80–1.80)
Retic Ct Abs: 104.78 10*3/uL — ABNORMAL HIGH (ref 34.80–93.90)
WBC: 4.7 10*3/uL (ref 4.0–10.3)
lymph#: 1 10*3/uL (ref 0.9–3.3)

## 2016-06-12 LAB — CEA (IN HOUSE-CHCC): CEA (CHCC-IN HOUSE): 1.17 ng/mL (ref 0.00–5.00)

## 2016-06-12 MED ORDER — SODIUM CHLORIDE 0.9 % IV SOLN
2400.0000 mg/m2 | INTRAVENOUS | Status: DC
  Administered 2016-06-12: 5850 mg via INTRAVENOUS
  Filled 2016-06-12: qty 117

## 2016-06-12 MED ORDER — DEXAMETHASONE SODIUM PHOSPHATE 10 MG/ML IJ SOLN
INTRAMUSCULAR | Status: AC
  Filled 2016-06-12: qty 1

## 2016-06-12 MED ORDER — PALONOSETRON HCL INJECTION 0.25 MG/5ML
0.2500 mg | Freq: Once | INTRAVENOUS | Status: AC
  Administered 2016-06-12: 0.25 mg via INTRAVENOUS

## 2016-06-12 MED ORDER — DEXAMETHASONE SODIUM PHOSPHATE 10 MG/ML IJ SOLN
4.0000 mg | Freq: Once | INTRAMUSCULAR | Status: AC
  Administered 2016-06-12: 4 mg via INTRAVENOUS

## 2016-06-12 MED ORDER — SODIUM CHLORIDE 0.9 % IV SOLN
Freq: Once | INTRAVENOUS | Status: AC
  Administered 2016-06-12: 14:00:00 via INTRAVENOUS

## 2016-06-12 MED ORDER — FLUOROURACIL CHEMO INJECTION 2.5 GM/50ML
400.0000 mg/m2 | Freq: Once | INTRAVENOUS | Status: AC
  Administered 2016-06-12: 950 mg via INTRAVENOUS
  Filled 2016-06-12: qty 19

## 2016-06-12 MED ORDER — SODIUM CHLORIDE 0.9 % IV SOLN
400.0000 mg/m2 | Freq: Once | INTRAVENOUS | Status: AC
  Administered 2016-06-12: 972 mg via INTRAVENOUS
  Filled 2016-06-12: qty 48.6

## 2016-06-12 MED ORDER — PALONOSETRON HCL INJECTION 0.25 MG/5ML
INTRAVENOUS | Status: AC
  Filled 2016-06-12: qty 5

## 2016-06-12 NOTE — Patient Instructions (Signed)
[facility] Discharge Instructions for Patients Receiving Chemotherapy  Today you received the following chemotherapy agents: Leucovorin and Adrucil.  To help prevent nausea and vomiting after your treatment, we encourage you to take your nausea medication as directed. No Zofran for 3 days. Take Compazine instead.    If you develop nausea and vomiting that is not controlled by your nausea medication, call the clinic.   BELOW ARE SYMPTOMS THAT SHOULD BE REPORTED IMMEDIATELY:  *FEVER GREATER THAN 100.5 F  *CHILLS WITH OR WITHOUT FEVER  NAUSEA AND VOMITING THAT IS NOT CONTROLLED WITH YOUR NAUSEA MEDICATION  *UNUSUAL SHORTNESS OF BREATH  *UNUSUAL BRUISING OR BLEEDING  TENDERNESS IN MOUTH AND THROAT WITH OR WITHOUT PRESENCE OF ULCERS  *URINARY PROBLEMS  *BOWEL PROBLEMS  UNUSUAL RASH Items with * indicate a potential emergency and should be followed up as soon as possible.  Feel free to call the clinic you have any questions or concerns. The clinic phone number is [phone].  Please show the CHEMO ALERT CARD at check-in to the Emergency Department and triage nurse.   

## 2016-06-12 NOTE — Progress Notes (Signed)
Nolene Bernheim ok to treat per Dr. Irene Limbo with Scr 1.7.  Elmyra Ricks, RN notified

## 2016-06-13 LAB — CEA: CEA: 1.6 ng/mL (ref 0.0–4.7)

## 2016-06-14 ENCOUNTER — Encounter: Payer: Self-pay | Admitting: Cardiology

## 2016-06-14 ENCOUNTER — Ambulatory Visit (HOSPITAL_BASED_OUTPATIENT_CLINIC_OR_DEPARTMENT_OTHER): Payer: Medicare Other

## 2016-06-14 VITALS — BP 130/63 | HR 70 | Temp 98.2°F | Resp 18

## 2016-06-14 DIAGNOSIS — Z452 Encounter for adjustment and management of vascular access device: Secondary | ICD-10-CM | POA: Diagnosis not present

## 2016-06-14 DIAGNOSIS — C184 Malignant neoplasm of transverse colon: Secondary | ICD-10-CM

## 2016-06-14 MED ORDER — SODIUM CHLORIDE 0.9% FLUSH
10.0000 mL | INTRAVENOUS | Status: DC | PRN
  Administered 2016-06-14: 10 mL
  Filled 2016-06-14: qty 10

## 2016-06-14 MED ORDER — HEPARIN SOD (PORK) LOCK FLUSH 100 UNIT/ML IV SOLN
500.0000 [IU] | Freq: Once | INTRAVENOUS | Status: AC | PRN
  Administered 2016-06-14: 500 [IU]
  Filled 2016-06-14: qty 5

## 2016-06-19 LAB — CUP PACEART REMOTE DEVICE CHECK
Battery Remaining Percentage: 100 %
Brady Statistic RA Percent Paced: 0 %
Date Time Interrogation Session: 20180102144500
Implantable Lead Implant Date: 20161122
Implantable Lead Implant Date: 20161122
Implantable Lead Location: 753858
Implantable Lead Model: 4674
Implantable Lead Model: 7742
Implantable Lead Serial Number: 521932
Implantable Lead Serial Number: 693952
Implantable Pulse Generator Implant Date: 20161122
Lead Channel Impedance Value: 750 Ohm
Lead Channel Pacing Threshold Pulse Width: 0.4 ms
Lead Channel Pacing Threshold Pulse Width: 0.4 ms
Lead Channel Pacing Threshold Pulse Width: 0.4 ms
Lead Channel Setting Pacing Amplitude: 2 V
Lead Channel Setting Pacing Amplitude: 2.5 V
Lead Channel Setting Pacing Amplitude: 2.5 V
Lead Channel Setting Pacing Pulse Width: 0.4 ms
Lead Channel Setting Pacing Pulse Width: 0.4 ms
MDC IDC LEAD IMPLANT DT: 20161122
MDC IDC LEAD LOCATION: 753859
MDC IDC LEAD LOCATION: 753860
MDC IDC LEAD SERIAL: 689802
MDC IDC MSMT BATTERY REMAINING LONGEVITY: 120 mo
MDC IDC MSMT LEADCHNL LV IMPEDANCE VALUE: 842 Ohm
MDC IDC MSMT LEADCHNL LV PACING THRESHOLD AMPLITUDE: 1 V
MDC IDC MSMT LEADCHNL RA IMPEDANCE VALUE: 620 Ohm
MDC IDC MSMT LEADCHNL RA PACING THRESHOLD AMPLITUDE: 0.5 V
MDC IDC MSMT LEADCHNL RV PACING THRESHOLD AMPLITUDE: 0.5 V
MDC IDC SET LEADCHNL LV SENSING SENSITIVITY: 2.5 mV
MDC IDC SET LEADCHNL RV SENSING SENSITIVITY: 2.5 mV
MDC IDC STAT BRADY RV PERCENT PACED: 99 %
Pulse Gen Serial Number: 702386

## 2016-06-26 ENCOUNTER — Ambulatory Visit (HOSPITAL_BASED_OUTPATIENT_CLINIC_OR_DEPARTMENT_OTHER): Payer: Medicare Other | Admitting: Hematology

## 2016-06-26 ENCOUNTER — Ambulatory Visit (HOSPITAL_BASED_OUTPATIENT_CLINIC_OR_DEPARTMENT_OTHER): Payer: Medicare Other

## 2016-06-26 ENCOUNTER — Other Ambulatory Visit (HOSPITAL_BASED_OUTPATIENT_CLINIC_OR_DEPARTMENT_OTHER): Payer: Medicare Other

## 2016-06-26 VITALS — BP 153/76 | HR 72 | Temp 98.0°F | Resp 18

## 2016-06-26 DIAGNOSIS — D6481 Anemia due to antineoplastic chemotherapy: Secondary | ICD-10-CM

## 2016-06-26 DIAGNOSIS — C184 Malignant neoplasm of transverse colon: Secondary | ICD-10-CM

## 2016-06-26 DIAGNOSIS — Z95828 Presence of other vascular implants and grafts: Secondary | ICD-10-CM

## 2016-06-26 DIAGNOSIS — D509 Iron deficiency anemia, unspecified: Secondary | ICD-10-CM | POA: Diagnosis not present

## 2016-06-26 DIAGNOSIS — Z5111 Encounter for antineoplastic chemotherapy: Secondary | ICD-10-CM | POA: Diagnosis not present

## 2016-06-26 DIAGNOSIS — Z9189 Other specified personal risk factors, not elsewhere classified: Secondary | ICD-10-CM

## 2016-06-26 LAB — CBC & DIFF AND RETIC
BASO%: 0.6 % (ref 0.0–2.0)
Basophils Absolute: 0 10*3/uL (ref 0.0–0.1)
EOS%: 1.9 % (ref 0.0–7.0)
Eosinophils Absolute: 0.1 10*3/uL (ref 0.0–0.5)
HCT: 33 % — ABNORMAL LOW (ref 38.4–49.9)
HGB: 11.1 g/dL — ABNORMAL LOW (ref 13.0–17.1)
IMMATURE RETIC FRACT: 11.9 % — AB (ref 3.00–10.60)
LYMPH#: 1 10*3/uL (ref 0.9–3.3)
LYMPH%: 18.4 % (ref 14.0–49.0)
MCH: 27.4 pg (ref 27.2–33.4)
MCHC: 33.6 g/dL (ref 32.0–36.0)
MCV: 81.5 fL (ref 79.3–98.0)
MONO#: 0.5 10*3/uL (ref 0.1–0.9)
MONO%: 9.5 % (ref 0.0–14.0)
NEUT%: 69.6 % (ref 39.0–75.0)
NEUTROS ABS: 3.7 10*3/uL (ref 1.5–6.5)
PLATELETS: 153 10*3/uL (ref 140–400)
RBC: 4.05 10*6/uL — ABNORMAL LOW (ref 4.20–5.82)
RDW: 18 % — ABNORMAL HIGH (ref 11.0–14.6)
Retic %: 2.4 % — ABNORMAL HIGH (ref 0.80–1.80)
Retic Ct Abs: 97.2 10*3/uL — ABNORMAL HIGH (ref 34.80–93.90)
WBC: 5.3 10*3/uL (ref 4.0–10.3)

## 2016-06-26 LAB — COMPREHENSIVE METABOLIC PANEL
ALT: 10 U/L (ref 0–55)
ANION GAP: 11 meq/L (ref 3–11)
AST: 16 U/L (ref 5–34)
Albumin: 3.5 g/dL (ref 3.5–5.0)
Alkaline Phosphatase: 99 U/L (ref 40–150)
BILIRUBIN TOTAL: 1.15 mg/dL (ref 0.20–1.20)
BUN: 22 mg/dL (ref 7.0–26.0)
CHLORIDE: 105 meq/L (ref 98–109)
CO2: 24 meq/L (ref 22–29)
Calcium: 9.9 mg/dL (ref 8.4–10.4)
Creatinine: 1.6 mg/dL — ABNORMAL HIGH (ref 0.7–1.3)
EGFR: 47 mL/min/{1.73_m2} — AB (ref >90)
GLUCOSE: 203 mg/dL — AB (ref 70–140)
POTASSIUM: 4.1 meq/L (ref 3.5–5.1)
SODIUM: 140 meq/L (ref 136–145)
TOTAL PROTEIN: 7.2 g/dL (ref 6.4–8.3)

## 2016-06-26 MED ORDER — PALONOSETRON HCL INJECTION 0.25 MG/5ML
INTRAVENOUS | Status: AC
  Filled 2016-06-26: qty 5

## 2016-06-26 MED ORDER — DEXAMETHASONE SODIUM PHOSPHATE 10 MG/ML IJ SOLN
4.0000 mg | Freq: Once | INTRAMUSCULAR | Status: AC
  Administered 2016-06-26: 4 mg via INTRAVENOUS

## 2016-06-26 MED ORDER — DEXAMETHASONE SODIUM PHOSPHATE 10 MG/ML IJ SOLN
INTRAMUSCULAR | Status: AC
  Filled 2016-06-26: qty 1

## 2016-06-26 MED ORDER — FLUOROURACIL CHEMO INJECTION 2.5 GM/50ML
400.0000 mg/m2 | Freq: Once | INTRAVENOUS | Status: AC
  Administered 2016-06-26: 950 mg via INTRAVENOUS
  Filled 2016-06-26: qty 19

## 2016-06-26 MED ORDER — PALONOSETRON HCL INJECTION 0.25 MG/5ML
0.2500 mg | Freq: Once | INTRAVENOUS | Status: AC
  Administered 2016-06-26: 0.25 mg via INTRAVENOUS

## 2016-06-26 MED ORDER — SODIUM CHLORIDE 0.9 % IV SOLN
400.0000 mg/m2 | Freq: Once | INTRAVENOUS | Status: AC
  Administered 2016-06-26: 972 mg via INTRAVENOUS
  Filled 2016-06-26: qty 48.6

## 2016-06-26 MED ORDER — SODIUM CHLORIDE 0.9 % IV SOLN
2400.0000 mg/m2 | INTRAVENOUS | Status: DC
  Administered 2016-06-26: 5850 mg via INTRAVENOUS
  Filled 2016-06-26: qty 117

## 2016-06-26 MED ORDER — SODIUM CHLORIDE 0.9 % IV SOLN
Freq: Once | INTRAVENOUS | Status: AC
  Administered 2016-06-26: 09:00:00 via INTRAVENOUS

## 2016-06-26 NOTE — Progress Notes (Signed)
Marland Kitchen    HEMATOLOGY/ONCOLOGY CLINIC NOTE  Date of Service:06/26/2016  Patient Care Team: [name], MD as PCP - General (Endocrinology)  CHIEF COMPLAINTS - follow-up for chemotherapy for colon cancer  Diagnosis: Stage IIIB Colon Cancer diagnosed 11/10/2015  Current treatment  Adjuvant FOLFOX status post 6 cycles. Has subsequently switched to 5-FU leucovorin only due to severe grade 3 fatigue and thrombocytopenia and has completed a total treatment of 9/12 cycles.  Previous treatment -  left hemicolectomy 11/10/2015 - Port-A-Cath placement 12/29/2015  INTERVAL HISTORY  Patient is here for his scheduled follow-up prior to his final 12th cycle of adjuvant chemotherapy for colon cancer.Marland Kitchen He notes he had a good holiday season and notes no acute new concerns. No abdominal pain no chest pain no shortness of breath. Bowel habits have been stable. Notes some mild numbness in his fingers and toes but no discomfort. Notes that his diabetes is better controlled. Reports that his port is a little uncomfortable when he turns his neck. No redness no pain or discomfort. We discussed and he would like to have this removed after his completion of chemotherapy.    MEDICAL HISTORY:  Past Medical History:  Diagnosis Date  . CKD (chronic kidney disease) stage 3, GFR 30-59 ml/min   . Complete heart block ([place])    s. 05/02/2015 s/p BSX U128 [place] CRT-P Beckie Salts).  Marland Kitchen GIB (gastrointestinal bleeding)    a. 10/2015- Hgb 4.6-->8u PRBC's;  b. 10/2015 EGD: non bleeding H pylori + ulceration; c. 10/2015 Colonoscopy: invasive adenocarcinoma.  . History of cardiomyopathy    a. Nonischemic, possibly tachycardia mediated;  b. 04/2015 Echo:  EF 60-65%, no rwma, mild AI/MR, mod dil LA/RA, PASP 60mHg.  .Marland KitchenHypertension   . Hypertensive heart disease   . OSA (obstructive sleep apnea), pt with apnea during procedures 10/11/2011   Denies  . Paroxysmal atrial fibrillation (HCC)    a. CHA2DS2VASc = 2-3 (Xarelto).  .  Stage IIIB Colon Cancer    a. 11/2015 Colonscopy: invasive adenocarcinoma;  b. 11/2015 s/p lap L colectomy; c. Pathology: pT3, pN1c Mx, MSI stable.  . Type 2 diabetes mellitus ([place]     SURGICAL HISTORY: Past Surgical History:  Procedure Laterality Date  . CARDIAC CATHETERIZATION    . CARDIOVERSION  10/11/2011   Procedure: CARDIOVERSION;  Surgeon: DLeonie Man MD;  [place] [facility]  Service: Cardiovascular;  Laterality: N/A;  . COLONOSCOPY N/A 11/05/2015   Procedure: COLONOSCOPY;  Surgeon: RRonald Lobo MD;  [facility];  Service: Endoscopy;  Laterality: N/A;  . EP IMPLANTABLE DEVICE N/A 05/02/2015   Procedure: BiV Pacemaker Insertion CRT-P;  Surgeon: GEvans Lance MD;  [facility];  Service: Cardiovascular;  Laterality: N/A;  . ESOPHAGOGASTRODUODENOSCOPY N/A 11/05/2015   Procedure: ESOPHAGOGASTRODUODENOSCOPY (EGD);  Surgeon: RRonald Lobo MD;  [facility];  Service: Endoscopy;  Laterality: N/A;  . EYE SURGERY  2015   left cataract with lens implant  . LAPAROSCOPIC PARTIAL COLECTOMY N/A 11/10/2015   Procedure: LAPAROSCOPIC LEFT COLECTOMY LAPAROSCOPIC MOBILIZATION OF SPLENIC FLEXURE;  Surgeon: EGreer Pickerel MD;  [place]  Service: General;  Laterality: N/A;  . Nuclear stress  11/01/2011   Severe global hypokinesis,dilated ventricle  . PORTACATH PLACEMENT N/A 12/29/2015   Procedure: INSERTION PORT-A-CATH WITH ULTRASOUND GUIDANCE;  Surgeon: EGreer Pickerel MD;  [facility];  Service: General;  Laterality: N/A;  . TEE WITHOUT CARDIOVERSION  10/10/2011   Procedure: TRANSESOPHAGEAL ECHOCARDIOGRAM (TEE);  Surgeon: MSanda Klein MD;  [facility];  Service: Cardiovascular;  Laterality: N/A;    SOCIAL HISTORY: Social History   Social History  . Marital status: Married    Spouse name: N/A  . Number of children: N/A  . Years of education: N/A   Occupational History  . Not on file.   Social History Main Topics  . Smoking status:  Never Smoker  . Smokeless tobacco: Never Used  . Alcohol use No  . Drug use: No  . Sexual activity: Not Currently   Other Topics Concern  . Not on file   Social History Narrative  . No narrative on file    FAMILY HISTORY: Family History  Problem Relation Age of Onset  . Colon cancer Mother   . Kidney disease Father     ESRF/HD, deceased    ALLERGIES:  is allergic to amiodarone and allopurinol.  MEDICATIONS:  Current Outpatient Prescriptions  Medication Sig Dispense Refill  . benazepril (LOTENSIN) 40 MG tablet TAKE 1 TABLET (40 MG TOTAL) BY MOUTH DAILY. 30 tablet 6  . carvedilol (COREG) 12.5 MG tablet Take 1 tablet (12.5 mg total) by mouth 2 (two) times daily with a meal. 60 tablet 2  . chlorhexidine (PERIDEX) 0.12 % solution Use as directed 15 mLs in the mouth or throat 2 (two) times daily. 473 mL 0  . dexamethasone (DECADRON) 4 MG tablet Take 2 tablets (8 mg total) by mouth daily. Start the day after chemotherapy for 2 days. Take with food. (Patient not taking: Reported on 04/09/2016) 30 tablet 1  . doxazosin (CARDURA) 8 MG tablet TAKE 1 TABLET (8 MG TOTAL) BY MOUTH AT BEDTIME. 30 tablet 9  . ferrous sulfate 325 (65 FE) MG EC tablet Take 1 tablet (325 mg total) by mouth daily with breakfast. 30 tablet 0  . furosemide (LASIX) 80 MG tablet TAKE 1 TABLET (80 MG TOTAL) BY MOUTH DAILY. 60 tablet 6  . lidocaine-prilocaine (EMLA) cream Apply to affected area once 30 g 3  . ondansetron (ZOFRAN) 8 MG tablet Take 1 tablet (8 mg total) by mouth 2 (two) times daily as needed for refractory nausea / vomiting. Start on day 3 after chemotherapy. 30 tablet 1  . potassium chloride (KLOR-CON M10) 10 MEQ tablet Take 1 tablet (10 mEq total) by mouth every other day. 15 tablet 11  . prochlorperazine (COMPAZINE) 10 MG tablet Take 1 tablet (10 mg total) by mouth every 6 (six) hours as needed (Nausea or vomiting). (Patient not taking: Reported on 04/09/2016) 30 tablet 1  . rosuvastatin (CRESTOR) 20 MG  tablet Take 20 mg by mouth every evening.     . sitaGLIPtin (JANUVIA) 100 MG tablet Take 100 mg by mouth daily.    . SUPER B COMPLEX & C TABS TAKE 1 CAPSULE BY MOUTH DAILY. 100 tablet 0  . UNABLE TO FIND Glucometer - 1  Lancets - 100  Test strips - 100  Alcohol pads - 100 1 each 0  . XARELTO 20 MG TABS tablet TAKE 1 TABLET BY MOUTH DAILY 30 tablet 5   No current facility-administered medications for this visit.    Facility-Administered Medications Ordered in Other Visits  Medication Dose Route Frequency Provider Last Rate Last Dose  . fluorouracil (ADRUCIL) 5,850 mg in sodium chloride 0.9 % 133 mL chemo infusion  2,400 mg/m2 (Treatment Plan Recorded) Intravenous 1 day or 1 dose Brunetta Genera, MD   5,850 mg at 06/26/16 1135    REVIEW OF SYSTEMS:    10 Point review of Systems  was done is negative except as noted above.  PHYSICAL EXAMINATION: ECOG PERFORMANCE STATUS: 2 - Symptomatic, <50% confined to bed   As per EPIC   . Wt Readings from Last 3 Encounters:  05/29/16 232 lb (105.2 kg)  05/15/16 226 lb 3.2 oz (102.6 kg)  04/09/16 221 lb (100.2 kg)   GENERAL:alert, in no acute distress and comfortable SKIN: skin color, texture, turgor are normal, no rashes or significant lesions EYES: normal, conjunctiva are pink and non-injected, sclera clear OROPHARYNX:no exudate, no erythema and lips, buccal mucosa, and tongue normal  NECK: supple, no JVD, thyroid normal size, non-tender, without nodularity LYMPH:  no palpable lymphadenopathy in the cervical, axillary or inguinal LUNGS: clear to auscultation with normal respiratory effort HEART: regular rate & rhythm,  no murmurs and no lower extremity edema ABDOMEN: Abdominal surgical incision has healed with minimal superficial crusting remaining . Musculoskeletal: no cyanosis of digits and no clubbing  PSYCH: alert & oriented x 3 with fluent speech NEURO: no focal motor/sensory deficits  LABORATORY DATA:  I have reviewed the data  as listed . CBC Latest Ref Rng & Units 06/26/2016 06/12/2016 05/29/2016  WBC 4.0 - 10.3 10e3/uL 5.3 4.7 5.3  Hemoglobin 13.0 - 17.1 g/dL 11.1(L) 10.9(L) 10.3(L)  Hematocrit 38.4 - 49.9 % 33.0(L) 32.8(L) 31.3(L)  Platelets 140 - 400 10e3/uL 153 159 148   . CMP Latest Ref Rng & Units 06/26/2016 06/12/2016 05/29/2016  Glucose 70 - 140 mg/dl 203(H) 240(H) 207(H)  BUN 7.0 - 26.0 mg/dL 22.0 22.2 21.0  Creatinine 0.7 - 1.3 mg/dL 1.6(H) 1.7(H) 1.6(H)  Sodium 136 - 145 mEq/L 140 141 138  Potassium 3.5 - 5.1 mEq/L 4.1 4.6 4.1  Chloride 101 - 111 mmol/L - - -  CO2 22 - 29 mEq/L 24 28 25   Calcium 8.4 - 10.4 mg/dL 9.9 10.4 9.6  Total Protein 6.4 - 8.3 g/dL 7.2 7.4 6.9  Total Bilirubin 0.20 - 1.20 mg/dL 1.15 0.95 0.94  Alkaline Phos 40 - 150 U/L 99 102 86  AST 5 - 34 U/L 16 16 14   ALT 0 - 55 U/L 10 10 11     RADIOGRAPHIC STUDIES: I have personally reviewed the radiological images as listed and agreed with the findings in the report. No results found.  ASSESSMENT & PLAN:   76 yo caucasian male with   1) Stage IIIB (pT3, pN1c, Mx) Colon Adenocarcinoma involving with transverse colon. Patient is s/p left sided colectomy on 11/10/2015 .  Patient is status post 6 cycles of FOLFOX Received 5-FU leucovorin for cycle 7-12 and tolerated this much better without significant thrombocytopenia or fatigue. 2) fatigue-significantly improved since discontinuation of oxaliplatin. Was affecting the patient's quality of life - this appears to be multifactorial the patient's chemotherapy , uncontrolled diabetes and poor oral intake and multiple other medical comorbidities . His fatigue is minimal at this time and he is eating much better and much more physically active. He is glad about how he is feeling now. 3) Abnormal LFTs -likely from chemotherapy. resolved 4 hypercalcemia likely due to dehydration. Resolved  5) Symptomatic anemia - Improved . 6)  thrombocytopenia likely related to chemotherapy.resolved 7)  Microcytic Anemia due to Iron deficiency and chemotherapy. Stable and improved Hgb improving. Post-operative CEA WNL  Plan -Patient's blood counts are stable - we will proceed with his 12th cycle of adjuvant chemotherapy with 5-FU /leucovorin.  -No other acute new prohibitive toxicities at this time I encouraged him to maintain good physical activity level and continue follow-up  with primary care physician for continued monitoring and optimization of his high blood pressure and diabetes. - Continue oral iron and B12 replacement -this is his last cycle of adjuvant chemotherapy -We will have his Port-A-Cath removed after completion of this cycle of chemotherapy. IR request placed. -RTC with Dr. Irene Limbo in 3 months with labs and CT chest abd pelvis without IV contrast (due to CKD)  8) HTN - controlled.  9) DM2- uncontrolled  10) Non ischemic chronic diastolic CHF 11) P. a fib on Xarelto per cardiology - will need to be mindful of his renal function and watch for bleeding. 12) CKD 3 monitoring kidney function - is also on ACE inhibitor as per primary care physician and diuretics which will need to be monitored closely. 14) Duodenal ulceration (h pylori -positive) - completed Abx for rx. On PPI  PLAN -continue f/u with PCP for management of other medical problems.  RTC with Dr. Irene Limbo in 3 months with labs and CT chest abd pelvis without IV contrast (due to CKD)   I spent 20 minutes counseling the patient face to face. The total time spent in the appointment was 25 minutes and more than 50% was on counseling and direct patient cares    Sullivan Lone MD [place] AAHIVMS [facility] Hematology/Oncology Physician [facility]  (Office):       [phone] (Work cell):  [phone] (Fax):           [phone]

## 2016-06-26 NOTE — Patient Instructions (Signed)
[facility] Discharge Instructions for Patients Receiving Chemotherapy  Today you received the following chemotherapy agents: Leucovorin and Adrucil  To help prevent nausea and vomiting after your treatment, we encourage you to take your nausea medication as directed.    If you develop nausea and vomiting that is not controlled by your nausea medication, call the clinic.   BELOW ARE SYMPTOMS THAT SHOULD BE REPORTED IMMEDIATELY:  *FEVER GREATER THAN 100.5 F  *CHILLS WITH OR WITHOUT FEVER  NAUSEA AND VOMITING THAT IS NOT CONTROLLED WITH YOUR NAUSEA MEDICATION  *UNUSUAL SHORTNESS OF BREATH  *UNUSUAL BRUISING OR BLEEDING  TENDERNESS IN MOUTH AND THROAT WITH OR WITHOUT PRESENCE OF ULCERS  *URINARY PROBLEMS  *BOWEL PROBLEMS  UNUSUAL RASH Items with * indicate a potential emergency and should be followed up as soon as possible.  Feel free to call the clinic you have any questions or concerns. The clinic phone number is [phone].  Please show the CHEMO ALERT CARD at check-in to the Emergency Department and triage nurse.   

## 2016-06-27 ENCOUNTER — Encounter: Payer: Self-pay | Admitting: *Deleted

## 2016-06-27 NOTE — Progress Notes (Signed)
Per MD Sullivan Lone, it is okay to have port removed.

## 2016-06-28 ENCOUNTER — Ambulatory Visit (HOSPITAL_BASED_OUTPATIENT_CLINIC_OR_DEPARTMENT_OTHER): Payer: Medicare Other

## 2016-06-28 VITALS — BP 138/66 | HR 66 | Temp 97.8°F | Resp 18 | Wt 243.0 lb

## 2016-06-28 DIAGNOSIS — Z452 Encounter for adjustment and management of vascular access device: Secondary | ICD-10-CM | POA: Diagnosis not present

## 2016-06-28 DIAGNOSIS — C184 Malignant neoplasm of transverse colon: Secondary | ICD-10-CM

## 2016-06-28 MED ORDER — SODIUM CHLORIDE 0.9% FLUSH
10.0000 mL | INTRAVENOUS | Status: DC | PRN
  Administered 2016-06-28: 10 mL
  Filled 2016-06-28: qty 10

## 2016-06-28 MED ORDER — HEPARIN SOD (PORK) LOCK FLUSH 100 UNIT/ML IV SOLN
500.0000 [IU] | Freq: Once | INTRAVENOUS | Status: AC | PRN
  Administered 2016-06-28: 500 [IU]
  Filled 2016-06-28: qty 5

## 2016-06-28 NOTE — Patient Instructions (Signed)

## 2016-06-30 ENCOUNTER — Telehealth: Payer: Self-pay | Admitting: Hematology

## 2016-06-30 NOTE — Telephone Encounter (Signed)
Lvm advising appts for lab 4/17 @ 9.15 and md 4/20 @ 11.30. Advised in vm radiology will call to sched scan and IR will call to remove port. Mailed appts for Apr.

## 2016-07-03 ENCOUNTER — Encounter: Payer: Self-pay | Admitting: Cardiology

## 2016-07-22 ENCOUNTER — Encounter: Payer: Self-pay | Admitting: Cardiovascular Disease

## 2016-07-22 ENCOUNTER — Telehealth: Payer: Self-pay

## 2016-07-22 NOTE — Telephone Encounter (Signed)
Sent via epic 

## 2016-07-22 NOTE — Telephone Encounter (Signed)
Request for surgical clearance:   1. What type surgery is being performed? Port-a-Cath removal  2. When is this surgery scheduled? pending  3. Are there any medications that need to be held prior to surgery and how long? Xarelto 20 mg QD  4. Name of the physician performing surgery: Dr Greer Pickerel   5. What is the office phone and fax number?   Phone [phone]  Fax [phone]

## 2016-07-24 NOTE — Telephone Encounter (Signed)
Faxed clearance letter to Ammie, LPN at Dr Dois Davenport office [facility].

## 2016-08-19 ENCOUNTER — Telehealth: Payer: Self-pay

## 2016-08-19 ENCOUNTER — Encounter: Payer: Self-pay | Admitting: Cardiovascular Disease

## 2016-08-19 NOTE — Telephone Encounter (Signed)
Epicd 

## 2016-08-19 NOTE — Telephone Encounter (Signed)
Request for surgical clearance:   1. What type surgery is being performed? Colonoscopy   2. When is this surgery scheduled? 09/26/16  3. Are there any medications that need to be held prior to surgery and how long? Xarelto 20 mg; [name] would like to stop the day before and morning of procedure  4. Name of the physician performing surgery: [name]  5. What is the office phone and fax number?   Phone [phone]  Fax [phone]

## 2016-08-21 NOTE — Telephone Encounter (Signed)
Clearance routed via epic.

## 2016-08-23 ENCOUNTER — Telehealth: Payer: Self-pay | Admitting: Cardiovascular Disease

## 2016-08-23 NOTE — Telephone Encounter (Signed)
New Message      *STAT* If patient is at the pharmacy, call can be transferred to refill team.  [facility] 1. Which medications need to be refilled? (please list name of each medication and dose if known)  carvedilol (COREG) 12.5 MG tablet Take 1 tablet (12.5 mg total) by mouth 2 (two) times daily with a meal.     2. Which pharmacy/location (including street and city if local pharmacy) is medication to be sent to? [facility] [address]  3. Do they need a 30 day or 90 day supply? 90  Pt has appt on 09/11/16 with Dr Loletha Grayer

## 2016-08-26 ENCOUNTER — Telehealth: Payer: Self-pay | Admitting: Cardiology

## 2016-08-26 MED ORDER — CARVEDILOL 12.5 MG PO TABS: 12.5000 mg | ORAL_TABLET | Freq: Two times a day (BID) | ORAL | 0 refills | Status: DC

## 2016-08-26 NOTE — Telephone Encounter (Signed)
New message    Pt 2nd call to get this rx filled     *STAT* If patient is at the pharmacy, call can be transferred to refill team.   1. Which medications need to be refilled? (please list name of each medication and dose if known)  carvedilol (COREG) 12.5 MG tablet Take 1 tablet (12.5 mg total) by mouth 2 (two) times daily with a meal.     2. Which pharmacy/location (including street and city if local pharmacy) is medication to be sent to? [facility] [address] [phone]   3. Do they need a 30 day or 90 day supply? [place]

## 2016-08-27 ENCOUNTER — Encounter: Payer: Self-pay | Admitting: Cardiovascular Disease

## 2016-09-11 ENCOUNTER — Encounter: Payer: Self-pay | Admitting: Cardiovascular Disease

## 2016-09-11 ENCOUNTER — Ambulatory Visit (INDEPENDENT_AMBULATORY_CARE_PROVIDER_SITE_OTHER): Payer: Medicare Other | Admitting: Cardiovascular Disease

## 2016-09-11 VITALS — BP 132/68 | HR 65 | Ht 75.0 in | Wt 246.8 lb

## 2016-09-11 DIAGNOSIS — I447 Left bundle-branch block, unspecified: Secondary | ICD-10-CM | POA: Diagnosis not present

## 2016-09-11 DIAGNOSIS — I442 Atrioventricular block, complete: Secondary | ICD-10-CM | POA: Diagnosis not present

## 2016-09-11 DIAGNOSIS — I480 Paroxysmal atrial fibrillation: Secondary | ICD-10-CM

## 2016-09-11 DIAGNOSIS — I10 Essential (primary) hypertension: Secondary | ICD-10-CM

## 2016-09-11 DIAGNOSIS — G4733 Obstructive sleep apnea (adult) (pediatric): Secondary | ICD-10-CM | POA: Diagnosis not present

## 2016-09-11 DIAGNOSIS — E1165 Type 2 diabetes mellitus with hyperglycemia: Secondary | ICD-10-CM

## 2016-09-11 DIAGNOSIS — I5042 Chronic combined systolic (congestive) and diastolic (congestive) heart failure: Secondary | ICD-10-CM

## 2016-09-11 DIAGNOSIS — I5032 Chronic diastolic (congestive) heart failure: Secondary | ICD-10-CM | POA: Diagnosis not present

## 2016-09-11 DIAGNOSIS — Z7901 Long term (current) use of anticoagulants: Secondary | ICD-10-CM

## 2016-09-11 DIAGNOSIS — Z950 Presence of cardiac pacemaker: Secondary | ICD-10-CM

## 2016-09-11 DIAGNOSIS — IMO0002 Reserved for concepts with insufficient information to code with codable children: Secondary | ICD-10-CM

## 2016-09-11 DIAGNOSIS — E782 Mixed hyperlipidemia: Secondary | ICD-10-CM | POA: Diagnosis not present

## 2016-09-11 DIAGNOSIS — E118 Type 2 diabetes mellitus with unspecified complications: Secondary | ICD-10-CM

## 2016-09-11 MED ORDER — RIVAROXABAN 20 MG PO TABS: 20.0000 mg | ORAL_TABLET | Freq: Every day | ORAL | 11 refills | Status: DC

## 2016-09-11 MED ORDER — CARVEDILOL 12.5 MG PO TABS: 12.5000 mg | ORAL_TABLET | Freq: Two times a day (BID) | ORAL | 11 refills | Status: DC

## 2016-09-11 MED ORDER — DOXAZOSIN MESYLATE 8 MG PO TABS: 8.0000 mg | ORAL_TABLET | Freq: Every day | ORAL | 11 refills | Status: DC

## 2016-09-11 MED ORDER — ROSUVASTATIN CALCIUM 20 MG PO TABS: 20.0000 mg | ORAL_TABLET | Freq: Every evening | ORAL | 11 refills | Status: DC

## 2016-09-11 MED ORDER — FUROSEMIDE 80 MG PO TABS: 80.0000 mg | ORAL_TABLET | Freq: Every day | ORAL | 11 refills | Status: DC

## 2016-09-11 MED ORDER — POTASSIUM CHLORIDE CRYS ER 10 MEQ PO TBCR: 10.0000 meq | EXTENDED_RELEASE_TABLET | ORAL | 11 refills | Status: DC

## 2016-09-11 NOTE — Patient Instructions (Addendum)
Dr Sallyanne Kuster recommends that you continue on your current medications as directed. Please refer to the Current Medication list given to you today.  Remote monitoring is used to monitor your Pacemaker of ICD from home. This monitoring reduces the number of office visits required to check your device to one time per year. It allows [place] to keep an eye on the functioning of your device to ensure it is working properly. You are scheduled for a device check from home on Thursday, July 5th, 2018. You may send your transmission at any time that day. If you have a wireless device, the transmission will be sent automatically. After your physician reviews your transmission, you will receive a postcard with your next transmission date.  Dr Sallyanne Kuster recommends that you schedule a follow-up appointment in 12 months with a pacemaker check. You will receive a reminder letter in the mail two months in advance. If you don't receive a letter, please call our office to schedule the follow-up appointment.  If you need a refill on your cardiac medications before your next appointment, please call your pharmacy.    HOLD Xarelto for your upcoming colonoscopy - your last dose should be Saturday, 09/14/16.

## 2016-09-11 NOTE — Progress Notes (Signed)
Cardiology Office Note    Date:  09/11/2016   ID:  Ernest Lara, DOB 08/09/1940, MRN [id number]  PCP:  Ernest Spurling, MD  Cardiologist:   Ernest Klein, MD   Chief Complaint  Patient presents with  . Follow-up    History of Present Illness:  Ernest Lara is a 76 y.o. male with complete heart block, nonischemic cardiomyopathy, compensated diastolic heart failure, hypertension, chronic kidney disease, type 2 diabetes mellitus, obstructive sleep apnea and previous left hemicolectomy for colon cancer. He has completed Treatment and is planning removal of his Port-A-Cath with Dr. Redmond Pulling in the near future. He is improved appetite and has gained back some of the weight loss. He has a large sigmoid colon polyp and is scheduled to undergo a complicated polypectomy next Tuesday with Dr. Cristina Lara. He will stop his anticoagulant 48 hours before the procedure and may need to stay off anticoagulation for 2 weeks.   He denies any overt bleeding. He denies abdominal pain. He has not had palpitations, dizziness, syncope, dyspnea or chest pain.  Pacemaker interrogation shows that he has not had any atrial arrhythmia since December 15. Even then the episode was very brief and looked more like atrial tachycardia. He has 99% biventricular pacing. The overall burden of atrial fibrillation is well under 1% over the last year, the longest episode lasting only 11 minutes.  Today he actually has intact A-V conduction at a rate of about 70 bpm.  Device interrogation shows normal function. His dual-chamber [employer] valitude X4 CRT-P device implanted November 2016 has roughly 9.5 years of anticipated longevity. All lead parameters are excellent.  LV lead is programmed LV ring 3-LV ring 2. There is 99% biventricular pacing. There has been no atrial fibrillation. Very rare, brief episodes of nonsustained ventricular tachycardia have been recorded.  Past Medical History:  Diagnosis Date  . CKD  (chronic kidney disease) stage 3, GFR 30-59 ml/min   . Complete heart block ([place])    s. 05/02/2015 s/p BSX U128 Valitude CRT-P Beckie Salts).  Marland Kitchen GIB (gastrointestinal bleeding)    a. 10/2015- Hgb 4.6-->8u PRBC's;  b. 10/2015 EGD: non bleeding H pylori + ulceration; c. 10/2015 Colonoscopy: invasive adenocarcinoma.  . History of cardiomyopathy    a. Nonischemic, possibly tachycardia mediated;  b. 04/2015 Echo:  EF 60-65%, no rwma, mild AI/MR, mod dil LA/RA, PASP 48mHg.  .Marland KitchenHypertension   . Hypertensive heart disease   . OSA (obstructive sleep apnea), pt with apnea during procedures 10/11/2011   Denies  . Paroxysmal atrial fibrillation (HCC)    a. CHA2DS2VASc = 2-3 (Xarelto).  . Stage IIIB Colon Cancer    a. 11/2015 Colonscopy: invasive adenocarcinoma;  b. 11/2015 s/p lap L colectomy; c. Pathology: pT3, pN1c Mx, MSI stable.  . Type 2 diabetes mellitus ([place]     Past Surgical History:  Procedure Laterality Date  . CARDIAC CATHETERIZATION    . CARDIOVERSION  10/11/2011   Procedure: CARDIOVERSION;  Surgeon: DLeonie Man MD;  [facility]  Service: Cardiovascular;  Laterality: N/A;  . COLONOSCOPY N/A 11/05/2015   Procedure: COLONOSCOPY;  Surgeon: RRonald Lobo MD;  [facility];  Service: Endoscopy;  Laterality: N/A;  . EP IMPLANTABLE DEVICE N/A 05/02/2015   Procedure: BiV Pacemaker Insertion CRT-P;  Surgeon: GEvans Lance MD;  [facility];  Service: Cardiovascular;  Laterality: N/A;  . ESOPHAGOGASTRODUODENOSCOPY N/A 11/05/2015   Procedure: ESOPHAGOGASTRODUODENOSCOPY (EGD);  Surgeon: RRonald Lobo MD;  [facility]  Service:  Endoscopy;  Laterality: N/A;  . EYE SURGERY  2015   left cataract with lens implant  . LAPAROSCOPIC PARTIAL COLECTOMY N/A 11/10/2015   Procedure: LAPAROSCOPIC LEFT COLECTOMY LAPAROSCOPIC MOBILIZATION OF SPLENIC FLEXURE;  Surgeon: Ernest Pickerel, MD;  Location: [facility];  Service: General;  Laterality: N/A;  . Nuclear stress  11/01/2011    Severe global hypokinesis,dilated ventricle  . PORTACATH PLACEMENT N/A 12/29/2015   Procedure: INSERTION PORT-A-CATH WITH ULTRASOUND GUIDANCE;  Surgeon: Ernest Pickerel, MD;  Location: [facility];  Service: General;  Laterality: N/A;  . TEE WITHOUT CARDIOVERSION  10/10/2011   Procedure: TRANSESOPHAGEAL ECHOCARDIOGRAM (TEE);  Surgeon: Ernest Klein, MD;  [facility];  Service: Cardiovascular;  Laterality: N/A;    Current Medications: Outpatient Medications Prior to Visit  Medication Sig Dispense Refill  . sitaGLIPtin (JANUVIA) 100 MG tablet Take 100 mg by mouth daily.    Marland Kitchen UNABLE TO FIND Glucometer - 1  Lancets - 100  Test strips - 100  Alcohol pads - 100 1 each 0  . carvedilol (COREG) 12.5 MG tablet Take 1 tablet (12.5 mg total) by mouth 2 (two) times daily with a meal. 60 tablet 0  . doxazosin (CARDURA) 8 MG tablet TAKE 1 TABLET (8 MG TOTAL) BY MOUTH AT BEDTIME. 30 tablet 9  . furosemide (LASIX) 80 MG tablet TAKE 1 TABLET (80 MG TOTAL) BY MOUTH DAILY. 60 tablet 6  . potassium chloride (KLOR-CON M10) 10 MEQ tablet Take 1 tablet (10 mEq total) by mouth every other day. 15 tablet 11  . rosuvastatin (CRESTOR) 20 MG tablet Take 20 mg by mouth every evening.     Alveda Reasons 20 MG TABS tablet TAKE 1 TABLET BY MOUTH DAILY 30 tablet 5  . benazepril (LOTENSIN) 40 MG tablet TAKE 1 TABLET (40 MG TOTAL) BY MOUTH DAILY. (Patient not taking: Reported on 09/11/2016) 30 tablet 6  . chlorhexidine (PERIDEX) 0.12 % solution Use as directed 15 mLs in the mouth or throat 2 (two) times daily. (Patient not taking: Reported on 09/11/2016) 473 mL 0  . dexamethasone (DECADRON) 4 MG tablet Take 2 tablets (8 mg total) by mouth daily. Start the day after chemotherapy for 2 days. Take with food. (Patient not taking: Reported on 09/11/2016) 30 tablet 1  . ferrous sulfate 325 (65 FE) MG EC tablet Take 1 tablet (325 mg total) by mouth daily with breakfast. (Patient not taking: Reported on 09/11/2016) 30 tablet 0  .  lidocaine-prilocaine (EMLA) cream Apply to affected area once (Patient not taking: Reported on 09/11/2016) 30 g 3  . ondansetron (ZOFRAN) 8 MG tablet Take 1 tablet (8 mg total) by mouth 2 (two) times daily as needed for refractory nausea / vomiting. Start on day 3 after chemotherapy. (Patient not taking: Reported on 09/11/2016) 30 tablet 1  . prochlorperazine (COMPAZINE) 10 MG tablet Take 1 tablet (10 mg total) by mouth every 6 (six) hours as needed (Nausea or vomiting). (Patient not taking: Reported on 09/11/2016) 30 tablet 1  . SUPER B COMPLEX & C TABS TAKE 1 CAPSULE BY MOUTH DAILY. (Patient not taking: Reported on 09/11/2016) 100 tablet 0   No facility-administered medications prior to visit.      Allergies:   Amiodarone and Allopurinol   Social History   Social History  . Marital status: Married    Spouse name: N/A  . Number of children: N/A  . Years of education: N/A   Social History Main Topics  . Smoking status: Never Smoker  . Smokeless tobacco: Never  Used  . Alcohol use No  . Drug use: No  . Sexual activity: Not Currently   Other Topics Concern  . None   Social History Narrative  . None     Family History:  The patient's family history includes Colon cancer in his mother; Kidney disease in his father.   ROS:   Please see the history of present illness.    ROS All other systems reviewed and are negative.   PHYSICAL EXAM:   VS:  BP 132/68   Pulse 65   Ht 6' 3"  (1.905 m)   Wt 111.9 kg (246 lb 12.8 oz)   BMI 30.85 kg/m    GEN: Well nourished, well developed, in no acute distress  HEENT: normal  Neck: no JVD, carotid bruits, or masses Cardiac: RRR; no murmurs, rubs, or gallops,no edema , Healthy pacemaker site Respiratory:  clear to auscultation bilaterally, normal work of breathing GI: soft, nontender, nondistended, + BS MS: no deformity or atrophy  Skin: warm and dry, no rash Neuro:  Alert and Oriented x 3, Strength and sensation are intact Psych: euthymic mood,  full affect  Wt Readings from Last 3 Encounters:  09/11/16 111.9 kg (246 lb 12.8 oz)  06/28/16 110.2 kg (243 lb)  05/29/16 105.2 kg (232 lb)      Studies/Labs Reviewed:   EKG:  EKG is ordered today.  The ekg ordered today demonstrates Atrial sensed biventricular paced, QTC 470 ms  Recent Labs: 11/03/2015: B Natriuretic Peptide 215.8 06/26/2016: ALT 10; BUN 22.0; Creatinine 1.6; HGB 11.1; Platelets 153; Potassium 4.1; Sodium 140   Additional studies/ records that were reviewed today include:  Records from oncology clinic visits, Dr. Cristina Lara    ASSESSMENT:    1. Complete heart block ([place])   2. Biventricular cardiac pacemaker in situ   3. Chronic diastolic heart failure (HCC)   4. Paroxysmal atrial fibrillation ([place])   5. Long term current use of anticoagulant therapy   6. Essential hypertension   7. Uncontrolled type 2 diabetes mellitus with complication, without long-term current use of insulin ([place])   8. Mixed hyperlipidemia   9. OSA (obstructive sleep apnea), pt with apnea during procedures      PLAN:  In order of problems listed above:  1. CHB: Should be considered pacemaker dependent. He has intact A-V conduction today, but usually there are no detected R waves. 2. CRT-P: normal device function with excellent biventricular pacing at 99%. Remote download in 3 months and office visit in 12 months 3. CHF: History of depressed left ventricular systolic function, partly tachycardia related cardiomyopathy, partly LBBB-related dyssynchrony, resolved. Most recent EF 60-65% after CRT-P. On ACE inhibitor and carvedilol in maximum doses. The nuclear euvolemic, NYHA functional class I   4. AFib:  No recent atrial fibrillation recorded on his device. CHADSVasc 4 (age, DM, HTN, HF). 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 5. Anticoagulation:Appropriately anticoagulated without overt bleeding. Stop Xarelto on April 7 in anticipation of colonoscopy and complex  polypectomy on April 10. Suspect he will have to stay off anticoagulation for a protracted period of about 2 weeks after this. The likelihood of embolic complications is low considering the very low burden of atrial fibrillation. 6. HTN: Well-controlled.  7. DM: meds per primary care physician. 8. HLP: Statin, will need to review this as he loses weight 9. OSA: Reports compliance with CPAP.     Medication Adjustments/Labs and Tests Ordered: Current medicines are reviewed at length with the patient today.  Concerns regarding  medicines are outlined above.  Medication changes, Labs and Tests ordered today are listed in the Patient Instructions below. Patient Instructions  Dr Sallyanne Kuster recommends that you continue on your current medications as directed. Please refer to the Current Medication list given to you today.  Remote monitoring is used to monitor your Pacemaker of ICD from home. This monitoring reduces the number of office visits required to check your device to one time per year. It allows [place] to keep an eye on the functioning of your device to ensure it is working properly. You are scheduled for a device check from home on Thursday, July 5th, 2018. You may send your transmission at any time that day. If you have a wireless device, the transmission will be sent automatically. After your physician reviews your transmission, you will receive a postcard with your next transmission date.  Dr Sallyanne Kuster recommends that you schedule a follow-up appointment in 12 months with a pacemaker check. You will receive a reminder letter in the mail two months in advance. If you don't receive a letter, please call our office to schedule the follow-up appointment.  If you need a refill on your cardiac medications before your next appointment, please call your pharmacy.    HOLD Xarelto for your upcoming colonoscopy - your last dose should be Saturday, 09/14/16.    Signed, Ernest Klein, MD  09/11/2016 10:04  AM    [facility] [place], [place], [place]  [address] Phone: [phone]; Fax: [phone]

## 2016-09-13 ENCOUNTER — Other Ambulatory Visit: Payer: Self-pay | Admitting: Cardiovascular Disease

## 2016-09-17 LAB — CUP PACEART INCLINIC DEVICE CHECK
Brady Statistic RA Percent Paced: 1 % — CL
Brady Statistic RV Percent Paced: 99 %
Implantable Lead Implant Date: 20161122
Implantable Lead Location: 753860
Implantable Lead Model: 7741
Implantable Pulse Generator Implant Date: 20161122
Lead Channel Impedance Value: 727 Ohm
Lead Channel Impedance Value: 732 Ohm
Lead Channel Pacing Threshold Amplitude: 1 V
Lead Channel Pacing Threshold Pulse Width: 0.4 ms
Lead Channel Sensing Intrinsic Amplitude: 25 mV
Lead Channel Sensing Intrinsic Amplitude: 7.2 mV
Lead Channel Sensing Intrinsic Amplitude: 8.5 mV
Lead Channel Setting Pacing Amplitude: 2.5 V
Lead Channel Setting Pacing Pulse Width: 0.4 ms
Lead Channel Setting Sensing Sensitivity: 2.5 mV
Lead Channel Setting Sensing Sensitivity: 2.5 mV
MDC IDC LEAD IMPLANT DT: 20161122
MDC IDC LEAD IMPLANT DT: 20161122
MDC IDC LEAD LOCATION: 753858
MDC IDC LEAD LOCATION: 753859
MDC IDC LEAD SERIAL: 521932
MDC IDC LEAD SERIAL: 689802
MDC IDC LEAD SERIAL: 693952
MDC IDC MSMT BATTERY REMAINING LONGEVITY: 114 mo
MDC IDC MSMT LEADCHNL RA IMPEDANCE VALUE: 723 Ohm
MDC IDC MSMT LEADCHNL RA PACING THRESHOLD AMPLITUDE: 0.9 V
MDC IDC MSMT LEADCHNL RA PACING THRESHOLD PULSEWIDTH: 0.4 ms
MDC IDC MSMT LEADCHNL RV PACING THRESHOLD AMPLITUDE: 0.7 V
MDC IDC MSMT LEADCHNL RV PACING THRESHOLD PULSEWIDTH: 0.4 ms
MDC IDC PG SERIAL: 702386
MDC IDC SESS DTM: 20180404040000
MDC IDC SET LEADCHNL LV PACING PULSEWIDTH: 0.4 ms
MDC IDC SET LEADCHNL RA PACING AMPLITUDE: 2.5 V
MDC IDC SET LEADCHNL RV PACING AMPLITUDE: 2 V

## 2016-09-18 ENCOUNTER — Telehealth: Payer: Self-pay | Admitting: Hematology

## 2016-09-18 NOTE — Telephone Encounter (Signed)
Covering AP - moved 4/20 f/u to 4/25 - date per patient. Spoke with patient.

## 2016-09-24 ENCOUNTER — Other Ambulatory Visit: Payer: Medicare Other

## 2016-09-24 ENCOUNTER — Ambulatory Visit (HOSPITAL_COMMUNITY): Payer: Medicare Other

## 2016-09-26 ENCOUNTER — Ambulatory Visit: Payer: Medicare Other | Admitting: Hematology

## 2016-09-27 ENCOUNTER — Ambulatory Visit: Payer: Medicare Other | Admitting: Hematology

## 2016-10-02 ENCOUNTER — Encounter: Payer: Medicare Other | Admitting: Hematology

## 2016-10-02 ENCOUNTER — Ambulatory Visit: Payer: Medicare Other

## 2016-10-02 ENCOUNTER — Telehealth: Payer: Self-pay | Admitting: *Deleted

## 2016-10-02 NOTE — Telephone Encounter (Signed)
Called pt regarding missed apt today.  LVM with pt to call back to reschedule apt.

## 2016-10-06 NOTE — Progress Notes (Signed)
This encounter was created in error - please disregard.

## 2016-12-12 ENCOUNTER — Telehealth: Payer: Self-pay | Admitting: Cardiology

## 2016-12-12 ENCOUNTER — Encounter: Payer: Medicare Other | Admitting: *Deleted

## 2016-12-12 NOTE — Telephone Encounter (Signed)
Spoke with pt and reminded pt of remote transmission that is due today. Pt verbalized understanding.   

## 2016-12-13 ENCOUNTER — Encounter: Payer: Self-pay | Admitting: Cardiology

## 2017-01-20 ENCOUNTER — Other Ambulatory Visit: Payer: Self-pay | Admitting: Family Medicine

## 2017-01-20 DIAGNOSIS — C189 Malignant neoplasm of colon, unspecified: Secondary | ICD-10-CM

## 2017-01-22 ENCOUNTER — Ambulatory Visit
Admission: RE | Admit: 2017-01-22 | Discharge: 2017-01-22 | Disposition: A | Discharge status: Home / Self Care | Payer: Medicare Other | Source: Ambulatory Visit | Attending: Family Medicine | Admitting: Family Medicine

## 2017-01-22 DIAGNOSIS — C189 Malignant neoplasm of colon, unspecified: Secondary | ICD-10-CM

## 2017-01-24 ENCOUNTER — Other Ambulatory Visit: Payer: Medicare Other

## 2017-01-24 ENCOUNTER — Other Ambulatory Visit: Payer: Self-pay

## 2017-01-24 DIAGNOSIS — C184 Malignant neoplasm of transverse colon: Secondary | ICD-10-CM

## 2017-01-27 ENCOUNTER — Encounter: Payer: Self-pay | Admitting: Hematology

## 2017-01-27 ENCOUNTER — Ambulatory Visit (HOSPITAL_BASED_OUTPATIENT_CLINIC_OR_DEPARTMENT_OTHER): Payer: Medicare Other

## 2017-01-27 ENCOUNTER — Ambulatory Visit (HOSPITAL_BASED_OUTPATIENT_CLINIC_OR_DEPARTMENT_OTHER): Payer: Medicare Other | Admitting: Hematology

## 2017-01-27 ENCOUNTER — Telehealth: Payer: Self-pay | Admitting: *Deleted

## 2017-01-27 ENCOUNTER — Telehealth: Payer: Self-pay | Admitting: Hematology

## 2017-01-27 VITALS — BP 122/60 | HR 76 | Temp 97.7°F | Resp 17 | Ht 75.0 in | Wt 222.2 lb

## 2017-01-27 DIAGNOSIS — D509 Iron deficiency anemia, unspecified: Secondary | ICD-10-CM

## 2017-01-27 DIAGNOSIS — C787 Secondary malignant neoplasm of liver and intrahepatic bile duct: Secondary | ICD-10-CM

## 2017-01-27 DIAGNOSIS — C184 Malignant neoplasm of transverse colon: Secondary | ICD-10-CM

## 2017-01-27 DIAGNOSIS — C189 Malignant neoplasm of colon, unspecified: Secondary | ICD-10-CM

## 2017-01-27 LAB — CBC WITH DIFFERENTIAL/PLATELET
BASO%: 0.5 % (ref 0.0–2.0)
BASOS ABS: 0.1 10*3/uL (ref 0.0–0.1)
EOS ABS: 0.4 10*3/uL (ref 0.0–0.5)
EOS%: 3.6 % (ref 0.0–7.0)
HCT: 33.5 % — ABNORMAL LOW (ref 38.4–49.9)
HGB: 11 g/dL — ABNORMAL LOW (ref 13.0–17.1)
LYMPH%: 9.4 % — ABNORMAL LOW (ref 14.0–49.0)
MCH: 23.4 pg — AB (ref 27.2–33.4)
MCHC: 33 g/dL (ref 32.0–36.0)
MCV: 71.1 fL — AB (ref 79.3–98.0)
MONO#: 1.1 10*3/uL — AB (ref 0.1–0.9)
MONO%: 10.8 % (ref 0.0–14.0)
NEUT%: 75.7 % — ABNORMAL HIGH (ref 39.0–75.0)
NEUTROS ABS: 7.6 10*3/uL — AB (ref 1.5–6.5)
PLATELETS: 226 10*3/uL (ref 140–400)
RBC: 4.71 10*6/uL (ref 4.20–5.82)
RDW: 18.6 % — AB (ref 11.0–14.6)
WBC: 10 10*3/uL (ref 4.0–10.3)
lymph#: 0.9 10*3/uL (ref 0.9–3.3)

## 2017-01-27 LAB — COMPREHENSIVE METABOLIC PANEL
ALBUMIN: 2.4 g/dL — AB (ref 3.5–5.0)
ALK PHOS: 266 U/L — AB (ref 40–150)
ALT: 40 U/L (ref 0–55)
ANION GAP: 7 meq/L (ref 3–11)
AST: 70 U/L — ABNORMAL HIGH (ref 5–34)
BILIRUBIN TOTAL: 1.43 mg/dL — AB (ref 0.20–1.20)
BUN: 20 mg/dL (ref 7.0–26.0)
CALCIUM: 10.5 mg/dL — AB (ref 8.4–10.4)
CO2: 23 mEq/L (ref 22–29)
Chloride: 104 mEq/L (ref 98–109)
Creatinine: 1.7 mg/dL — ABNORMAL HIGH (ref 0.7–1.3)
EGFR: 46 mL/min/{1.73_m2} — AB (ref >90)
GLUCOSE: 147 mg/dL — AB (ref 70–140)
Potassium: 4.2 mEq/L (ref 3.5–5.1)
Sodium: 134 mEq/L — ABNORMAL LOW (ref 136–145)
TOTAL PROTEIN: 6.8 g/dL (ref 6.4–8.3)

## 2017-01-27 LAB — CEA (IN HOUSE-CHCC)

## 2017-01-27 NOTE — Patient Instructions (Signed)
Thank you for choosing [facility] to provide your oncology and hematology care.  To afford each patient quality time with our providers, please arrive 30 minutes before your scheduled appointment time.  If you arrive late for your appointment, you may be asked to reschedule.  We strive to give you quality time with our providers, and arriving late affects you and other patients whose appointments are after yours.   If you are a no show for multiple scheduled visits, you may be dismissed from the clinic at the providers discretion.    Again, thank you for choosing [facility], our hope is that these requests will decrease the amount of time that you wait before being seen by our physicians.  ______________________________________________________________________  Should you have questions after your visit to the [facility], please contact our office at [phone] between the hours of 8:30 and 4:30 p.m.    Voicemails left after 4:30p.m will not be returned until the following business day.    For prescription refill requests, please have your pharmacy contact us directly.  Please also try to allow 48 hours for prescription requests.    Please contact the scheduling department for questions regarding scheduling.  For scheduling of procedures such as PET scans, CT scans, MRI, Ultrasound, etc please contact central scheduling at [phone].    Resources For Cancer Patients and Caregivers:   Oncolink.org:  A wonderful resource for patients and healthcare providers for information regarding your disease, ways to tract your treatment, what to expect, etc.     American Cancer Society:  800-227-2345  Can help patients locate various types of support and financial assistance  Cancer Care: 1-800-813-HOPE (4673) Provides financial assistance, online support groups, medication/co-pay assistance.    [place] DSS:  336-641-3447 Where to apply for food  stamps, Medicaid, and utility assistance  Medicare Rights Center: 800-333-4114 Helps people with Medicare understand their rights and benefits, navigate the Medicare system, and secure the quality healthcare they deserve  SCAT: 336-333-6589 [place] [employer]'s shared-ride transportation service for eligible riders who have a disability that prevents them from riding the fixed route bus.    For additional information on assistance programs please contact our social worker:   Grier Hock/Abigail Elmore:  [phone]            

## 2017-01-27 NOTE — Telephone Encounter (Signed)
Pt's son called stating pt has decided to move forward with treatment discussed at today's office visit.  Pts son Timmothy Sours requested call back to have some questions answered.  Informed Dr. Irene Limbo, per MD, will need an indepth discussion regarding options.  Call back number given to Dr. Irene Limbo to touch base with pt/pt's son.  [phone].

## 2017-01-27 NOTE — Telephone Encounter (Signed)
No appts scheduled per 8/20 los - Patient will call to schedule f/u based on family discussion and decision made.

## 2017-01-27 NOTE — Telephone Encounter (Signed)
SW Varney Biles at [facility] requesting testing for Foundation One to biopsy done 11/2015.  Per Varney Biles, ok to add.  Call back number provided for questions.

## 2017-01-28 LAB — CEA

## 2017-01-30 ENCOUNTER — Other Ambulatory Visit: Payer: Self-pay | Admitting: Hematology

## 2017-01-30 DIAGNOSIS — C787 Secondary malignant neoplasm of liver and intrahepatic bile duct: Principal | ICD-10-CM

## 2017-01-30 DIAGNOSIS — C189 Malignant neoplasm of colon, unspecified: Secondary | ICD-10-CM

## 2017-01-30 NOTE — Progress Notes (Signed)
Patient on plan of care prior to pathways. 

## 2017-01-30 NOTE — Progress Notes (Signed)
Colorectal - No Medical Intervention - Off Treatment.  Patient Characteristics: Metastatic Colorectal, First Line, Nonsurgical Candidate, KRAS Mutation Positive/Unknown, BRAF Wild-Type/Unknown, PS > 1; Bevacizumab Eligible Current evidence of distant metastases? Yes AJCC T Category: TX AJCC N Category: NX AJCC M Category: M1c AJCC 8 Stage Grouping: IVC BRAF Mutation Status: Awaiting Test Results KRAS/NRAS Mutation Status: Awaiting Test Results Line of therapy: First Line Would you be surprised if this patient died  in the next year? I would NOT be surprised if this patient died in the next year Performance Status: PS > 1 Bevacizumab Eligibility: Eligible

## 2017-01-30 NOTE — Progress Notes (Signed)
DISCONTINUE ON PATHWAY REGIMEN - Colorectal  No Medical Intervention - Off Treatment.  PRIOR TREATMENT: Off Treatment  Colorectal - No Medical Intervention - Off Treatment.  Patient Characteristics: Metastatic Colorectal, First Line, Nonsurgical Candidate, KRAS Mutation Positive/Unknown, BRAF Wild-Type/Unknown, PS > 1; Bevacizumab Eligible Current evidence of distant metastases? Yes AJCC T Category: TX AJCC N Category: NX AJCC M Category: M1c AJCC 8 Stage Grouping: IVC BRAF Mutation Status: Awaiting Test Results KRAS/NRAS Mutation Status: Awaiting Test Results Line of therapy: First Line Would you be surprised if this patient died  in the next year? I would NOT be surprised if this patient died in the next year Performance Status: PS > 1 Bevacizumab Eligibility: Eligible

## 2017-01-31 ENCOUNTER — Other Ambulatory Visit: Payer: Self-pay | Admitting: Student

## 2017-02-03 ENCOUNTER — Other Ambulatory Visit: Payer: Self-pay | Admitting: Hematology

## 2017-02-03 ENCOUNTER — Encounter (HOSPITAL_COMMUNITY): Payer: Self-pay

## 2017-02-03 ENCOUNTER — Ambulatory Visit (HOSPITAL_COMMUNITY)
Admission: RE | Admit: 2017-02-03 | Discharge: 2017-02-03 | Disposition: A | Discharge status: Home / Self Care | Payer: Medicare Other | Source: Ambulatory Visit | Attending: Interventional Radiology | Admitting: Interventional Radiology

## 2017-02-03 ENCOUNTER — Ambulatory Visit (HOSPITAL_COMMUNITY)
Admission: RE | Admit: 2017-02-03 | Discharge: 2017-02-03 | Disposition: A | Discharge status: Home / Self Care | Payer: Medicare Other | Source: Ambulatory Visit | Attending: Hematology | Admitting: Hematology

## 2017-02-03 DIAGNOSIS — Z9049 Acquired absence of other specified parts of digestive tract: Secondary | ICD-10-CM | POA: Insufficient documentation

## 2017-02-03 DIAGNOSIS — N183 Chronic kidney disease, stage 3 (moderate): Secondary | ICD-10-CM | POA: Diagnosis not present

## 2017-02-03 DIAGNOSIS — I1310 Hypertensive heart and chronic kidney disease without heart failure, with stage 1 through stage 4 chronic kidney disease, or unspecified chronic kidney disease: Secondary | ICD-10-CM | POA: Insufficient documentation

## 2017-02-03 DIAGNOSIS — E1122 Type 2 diabetes mellitus with diabetic chronic kidney disease: Secondary | ICD-10-CM | POA: Insufficient documentation

## 2017-02-03 DIAGNOSIS — Z7901 Long term (current) use of anticoagulants: Secondary | ICD-10-CM | POA: Diagnosis not present

## 2017-02-03 DIAGNOSIS — Z79899 Other long term (current) drug therapy: Secondary | ICD-10-CM | POA: Insufficient documentation

## 2017-02-03 DIAGNOSIS — C787 Secondary malignant neoplasm of liver and intrahepatic bile duct: Secondary | ICD-10-CM | POA: Insufficient documentation

## 2017-02-03 DIAGNOSIS — I428 Other cardiomyopathies: Secondary | ICD-10-CM | POA: Diagnosis not present

## 2017-02-03 DIAGNOSIS — C189 Malignant neoplasm of colon, unspecified: Secondary | ICD-10-CM

## 2017-02-03 DIAGNOSIS — Z950 Presence of cardiac pacemaker: Secondary | ICD-10-CM | POA: Diagnosis not present

## 2017-02-03 DIAGNOSIS — I480 Paroxysmal atrial fibrillation: Secondary | ICD-10-CM | POA: Diagnosis not present

## 2017-02-03 HISTORY — PX: IR US GUIDE VASC ACCESS RIGHT: IMG2390

## 2017-02-03 HISTORY — PX: IR FLUORO GUIDE PORT INSERTION RIGHT: IMG5741

## 2017-02-03 LAB — CBC
HCT: 31.8 % — ABNORMAL LOW (ref 39.0–52.0)
HEMOGLOBIN: 10.7 g/dL — AB (ref 13.0–17.0)
MCH: 22.6 pg — AB (ref 26.0–34.0)
MCHC: 33.6 g/dL (ref 30.0–36.0)
MCV: 67.2 fL — AB (ref 78.0–100.0)
PLATELETS: 187 10*3/uL (ref 150–400)
RBC: 4.73 MIL/uL (ref 4.22–5.81)
RDW: 17.5 % — ABNORMAL HIGH (ref 11.5–15.5)
WBC: 12 10*3/uL — ABNORMAL HIGH (ref 4.0–10.5)

## 2017-02-03 LAB — PROTIME-INR
INR: 1.43
PROTHROMBIN TIME: 17.6 s — AB (ref 11.4–15.2)

## 2017-02-03 LAB — APTT: aPTT: 40 seconds — ABNORMAL HIGH (ref 24–36)

## 2017-02-03 MED ORDER — MIDAZOLAM HCL 2 MG/2ML IJ SOLN
INTRAMUSCULAR | Status: AC
  Filled 2017-02-03: qty 2

## 2017-02-03 MED ORDER — FENTANYL CITRATE (PF) 100 MCG/2ML IJ SOLN
INTRAMUSCULAR | Status: AC
  Filled 2017-02-03: qty 2

## 2017-02-03 MED ORDER — HEPARIN SOD (PORK) LOCK FLUSH 100 UNIT/ML IV SOLN
INTRAVENOUS | Status: AC
  Administered 2017-02-03: 13:00:00
  Filled 2017-02-03: qty 5

## 2017-02-03 MED ORDER — LIDOCAINE-EPINEPHRINE (PF) 2 %-1:200000 IJ SOLN
INTRAMUSCULAR | Status: AC | PRN
  Administered 2017-02-03: 10 mL via INTRADERMAL

## 2017-02-03 MED ORDER — CEFAZOLIN SODIUM-DEXTROSE 2-4 GM/100ML-% IV SOLN
2.0000 g | INTRAVENOUS | Status: AC
  Administered 2017-02-03: 2 g via INTRAVENOUS

## 2017-02-03 MED ORDER — FENTANYL CITRATE (PF) 100 MCG/2ML IJ SOLN
INTRAMUSCULAR | Status: AC | PRN
  Administered 2017-02-03 (×2): 50 ug via INTRAVENOUS

## 2017-02-03 MED ORDER — MIDAZOLAM HCL 2 MG/2ML IJ SOLN
INTRAMUSCULAR | Status: AC | PRN
  Administered 2017-02-03 (×2): 1 mg via INTRAVENOUS

## 2017-02-03 MED ORDER — LIDOCAINE-EPINEPHRINE (PF) 2 %-1:200000 IJ SOLN
INTRAMUSCULAR | Status: AC
  Filled 2017-02-03: qty 20

## 2017-02-03 MED ORDER — CEFAZOLIN SODIUM-DEXTROSE 2-4 GM/100ML-% IV SOLN
INTRAVENOUS | Status: AC
  Filled 2017-02-03: qty 100

## 2017-02-03 MED ORDER — SODIUM CHLORIDE 0.9 % IV SOLN
INTRAVENOUS | Status: DC
  Administered 2017-02-03: 11:00:00 via INTRAVENOUS

## 2017-02-03 NOTE — Sedation Documentation (Signed)
Patient is resting comfortably. 

## 2017-02-03 NOTE — Discharge Instructions (Addendum)
Moderate Conscious Sedation, Adult, Care After °These instructions provide you with information about caring for yourself after your procedure. Your health care provider may also give you more specific instructions. Your treatment has been planned according to current medical practices, but problems sometimes occur. Call your health care provider if you have any problems or questions after your procedure. °What can I expect after the procedure? °After your procedure, it is common: °· To feel sleepy for several hours. °· To feel clumsy and have poor balance for several hours. °· To have poor judgment for several hours. °· To vomit if you eat too soon. ° °Follow these instructions at home: °For at least 24 hours after the procedure: ° °· Do not: °? Participate in activities where you could fall or become injured. °? Drive. °? Use heavy machinery. °? Drink alcohol. °? Take sleeping pills or medicines that cause drowsiness. °? Make important decisions or sign legal documents. °? Take care of children on your own. °· Rest. °Eating and drinking °· Follow the diet recommended by your health care provider. °· If you vomit: °? Drink water, juice, or soup when you can drink without vomiting. °? Make sure you have little or no nausea before eating solid foods. °General instructions °· Have a responsible adult stay with you until you are awake and alert. °· Take over-the-counter and prescription medicines only as told by your health care provider. °· If you smoke, do not smoke without supervision. °· Keep all follow-up visits as told by your health care provider. This is important. °Contact a health care provider if: °· You keep feeling nauseous or you keep vomiting. °· You feel light-headed. °· You develop a rash. °· You have a fever. °Get help right away if: °· You have trouble breathing. °This information is not intended to replace advice given to you by your health care provider. Make sure you discuss any questions you have  with your health care provider. °Document Released: 03/17/2013 Document Revised: 10/30/2015 Document Reviewed: 09/16/2015 °Elsevier Interactive Patient Education © 2018 [employer] ° ° °Implanted Port Home Guide °An implanted port is a type of central line that is placed under the skin. Central lines are used to provide IV access when treatment or nutrition needs to be given through a person’s veins. Implanted ports are used for long-term IV access. An implanted port may be placed because: °· You need IV medicine that would be irritating to the small veins in your hands or arms. °· You need long-term IV medicines, such as antibiotics. °· You need IV nutrition for a long period. °· You need frequent blood draws for lab tests. °· You need dialysis. ° °Implanted ports are usually placed in the chest area, but they can also be placed in the upper arm, the abdomen, or the leg. An implanted port has two main parts: °· Reservoir. The reservoir is round and will appear as a small, raised area under your skin. The reservoir is the part where a needle is inserted to give medicines or draw blood. °· Catheter. The catheter is a thin, flexible tube that extends from the reservoir. The catheter is placed into a large vein. Medicine that is inserted into the reservoir goes into the catheter and then into the vein. ° °How will I care for my incision site? °Do not get the incision site wet. Bathe or shower as directed by your health care provider. °How is my port accessed? °Special steps must be taken to access the   port: °· Before the port is accessed, a numbing cream can be placed on the skin. This helps numb the skin over the port site. °· Your health care provider uses a sterile technique to access the port. °? Your health care provider must put on a mask and sterile gloves. °? The skin over your port is cleaned carefully with an antiseptic and allowed to dry. °? The port is gently pinched between sterile gloves, and a needle  is inserted into the port. °· Only "non-coring" port needles should be used to access the port. Once the port is accessed, a blood return should be checked. This helps ensure that the port is in the vein and is not clogged. °· If your port needs to remain accessed for a constant infusion, a clear (transparent) bandage will be placed over the needle site. The bandage and needle will need to be changed every week, or as directed by your health care provider. °· Keep the bandage covering the needle clean and dry. Do not get it wet. Follow your health care provider’s instructions on how to take a shower or bath while the port is accessed. °· If your port does not need to stay accessed, no bandage is needed over the port. ° °What is flushing? °Flushing helps keep the port from getting clogged. Follow your health care provider’s instructions on how and when to flush the port. Ports are usually flushed with saline solution or a medicine called heparin. The need for flushing will depend on how the port is used. °· If the port is used for intermittent medicines or blood draws, the port will need to be flushed: °? After medicines have been given. °? After blood has been drawn. °? As part of routine maintenance. °· If a constant infusion is running, the port may not need to be flushed. ° °How long will my port stay implanted? °The port can stay in for as long as your health care provider thinks it is needed. When it is time for the port to come out, surgery will be done to remove it. The procedure is similar to the one performed when the port was put in. °When should I seek immediate medical care? °When you have an implanted port, you should seek immediate medical care if: °· You notice a bad smell coming from the incision site. °· You have swelling, redness, or drainage at the incision site. °· You have more swelling or pain at the port site or the surrounding area. °· You have a fever that is not controlled with  medicine. ° °This information is not intended to replace advice given to you by your health care provider. Make sure you discuss any questions you have with your health care provider. °Document Released: 05/27/2005 Document Revised: 11/02/2015 Document Reviewed: 02/01/2013 °Elsevier Interactive Patient Education © 2017 [employer] ° ° °Implanted Port Insertion, Care After °This sheet gives you information about how to care for yourself after your procedure. Your health care provider may also give you more specific instructions. If you have problems or questions, contact your health care provider. °What can I expect after the procedure? °After your procedure, it is common to have: °· Discomfort at the port insertion site. °· Bruising on the skin over the port. This should improve over 3-4 days. ° °Follow these instructions at home: °Port care °· After your port is placed, you will get a manufacturer's information card. The card has information about your port. Keep this card with   you at all times. °· Take care of the port as told by your health care provider. Ask your health care provider if you or a family member can get training for taking care of the port at home. A home health care nurse may also take care of the port. °· Make sure to remember what type of port you have. °Incision care °· Follow instructions from your health care provider about how to take care of your port insertion site. Make sure you: °? Wash your hands with soap and water before you change your bandage (dressing). If soap and water are not available, use hand sanitizer. °? Change your dressing as told by your health care provider. °? Leave stitches (sutures), skin glue, or adhesive strips in place. These skin closures may need to stay in place for 2 weeks or longer. If adhesive strip edges start to loosen and curl up, you may trim the loose edges. Do not remove adhesive strips completely unless your health care provider tells you to do  that. °· Check your port insertion site every day for signs of infection. Check for: °? More redness, swelling, or pain. °? More fluid or blood. °? Warmth. °? Pus or a bad smell. °General instructions °· Do not take baths, swim, or use a hot tub until your health care provider approves. °· Do not lift anything that is heavier than 10 lb (4.5 kg) for a week, or as told by your health care provider. °· Ask your health care provider when it is okay to: °? Return to work or school. °? Resume usual physical activities or sports. °· Do not drive for 24 hours if you were given a medicine to help you relax (sedative). °· Take over-the-counter and prescription medicines only as told by your health care provider. °· Wear a medical alert bracelet in case of an emergency. This will tell any health care providers that you have a port. °· Keep all follow-up visits as told by your health care provider. This is important. °Contact a health care provider if: °· You cannot flush your port with saline as directed, or you cannot draw blood from the port. °· You have a fever or chills. °· You have more redness, swelling, or pain around your port insertion site. °· You have more fluid or blood coming from your port insertion site. °· Your port insertion site feels warm to the touch. °· You have pus or a bad smell coming from the port insertion site. °Get help right away if: °· You have chest pain or shortness of breath. °· You have bleeding from your port that you cannot control. °Summary °· Take care of the port as told by your health care provider. °· Change your dressing as told by your health care provider. °· Keep all follow-up visits as told by your health care provider. °This information is not intended to replace advice given to you by your health care provider. Make sure you discuss any questions you have with your health care provider. °Document Released: 03/17/2013 Document Revised: 04/17/2016 Document Reviewed:  04/17/2016 °Elsevier Interactive Patient Education © 2017 Else[employer] Inc. ° ° °

## 2017-02-03 NOTE — Procedures (Signed)
Pre Procedure Dx: Colon Cancer Post Procedural Dx: Same  Successful placement of right IJ approach port-a-cath with tip at the superior caval atrial junction. The catheter is ready for immediate use.  Estimated Blood Loss: Minimal  Complications: None immediate.  Jay Carlyann Placide, MD Pager #: [phone]   

## 2017-02-03 NOTE — H&P (Signed)
Chief Complaint: Patient was seen in consultation today for colon cancer  Referring Physician(s): Brunetta Genera  Supervising Physician: Sandi Mariscal  Patient Status: [name]  History of Present Illness: Ernest Lara is a 76 y.o. male with past medical history of CKD stage 3, GI bleed (10/2015), nonischemic cardiomyopathy, OSA, a fib, and DM who was diagnosed with Stage 3B colon cancer in 11/2015.   He has undergone chemotherapy through Port-A-Cath placed 12/29/2015 by Dr. Redmond Pulling and removed approximately 8 months ago.  He now has signs of disease progression and plans for upcoming chemotherapy.   Port-A-Cath placement requested by Dr. Irene Limbo.   Patient is NPO.  He has appropriately held his Xarelto.    Past Medical History:  Diagnosis Date  . CKD (chronic kidney disease) stage 3, GFR 30-59 ml/min    kidney function concerns with chemo  . Complete heart block ([place])    s. 05/02/2015 s/p BSX U128 Valitude CRT-P Beckie Salts).  Marland Kitchen GIB (gastrointestinal bleeding)    a. 10/2015- Hgb 4.6-->8u PRBC's;  b. 10/2015 EGD: non bleeding H pylori + ulceration; c. 10/2015 Colonoscopy: invasive adenocarcinoma.  . History of cardiomyopathy    a. Nonischemic, possibly tachycardia mediated;  b. 04/2015 Echo:  EF 60-65%, no rwma, mild AI/MR, mod dil LA/RA, PASP 39mHg.  .Marland KitchenHypertension   . Hypertensive heart disease   . Paroxysmal atrial fibrillation (HCC)    a. CHA2DS2VASc = 2-3 (Xarelto).  . Stage IIIB Colon Cancer    a. 11/2015 Colonscopy: invasive adenocarcinoma;  b. 11/2015 s/p lap L colectomy; c. Pathology: pT3, pN1c Mx, MSI stable.  . Type 2 diabetes mellitus ([facility]     Past Surgical History:  Procedure Laterality Date  . CARDIAC CATHETERIZATION    . CARDIOVERSION  10/11/2011   Procedure: CARDIOVERSION;  Surgeon: DLeonie Man MD;  [place]  Service: Cardiovascular;  Laterality: N/A;  . COLONOSCOPY N/A 11/05/2015   Procedure: COLONOSCOPY;  Surgeon: RRonald Lobo MD;   [facility];  Service: Endoscopy;  Laterality: N/A;  . EP IMPLANTABLE DEVICE N/A 05/02/2015   Procedure: BiV Pacemaker Insertion CRT-P;  Surgeon: GEvans Lance MD;  [facility];  Service: Cardiovascular;  Laterality: N/A;  . ESOPHAGOGASTRODUODENOSCOPY N/A 11/05/2015   Procedure: ESOPHAGOGASTRODUODENOSCOPY (EGD);  Surgeon: RRonald Lobo MD;  [facility];  Service: Endoscopy;  Laterality: N/A;  . EYE SURGERY  2015   left cataract with lens implant  . LAPAROSCOPIC PARTIAL COLECTOMY N/A 11/10/2015   Procedure: LAPAROSCOPIC LEFT COLECTOMY LAPAROSCOPIC MOBILIZATION OF SPLENIC FLEXURE;  Surgeon: EGreer Pickerel MD;  [place]  Service: General;  Laterality: N/A;  . Nuclear stress  11/01/2011   Severe global hypokinesis,dilated ventricle  . PORTACATH PLACEMENT N/A 12/29/2015   Procedure: INSERTION PORT-A-CATH WITH ULTRASOUND GUIDANCE;  Surgeon: EGreer Pickerel MD;  Location: [facility];  Service: General;  Laterality: N/A;  . TEE WITHOUT CARDIOVERSION  10/10/2011   Procedure: TRANSESOPHAGEAL ECHOCARDIOGRAM (TEE);  Surgeon: MSanda Klein MD;  [facility];  Service: Cardiovascular;  Laterality: N/A;    Allergies: Amiodarone and Allopurinol  Medications: Prior to Admission medications   Medication Sig Start Date End Date Taking? Authorizing Provider  carvedilol (COREG) 12.5 MG tablet Take 1 tablet (12.5 mg total) by mouth 2 (two) times daily with a meal. 09/11/16   Croitoru, Mihai, MD  doxazosin (CARDURA) 8 MG tablet Take 1 tablet (8 mg total) by mouth at bedtime. 09/11/16   Croitoru, Mihai, MD  furosemide (LASIX) 80  MG tablet Take 1 tablet (80 mg total) by mouth daily. 09/11/16   Croitoru, Mihai, MD  potassium chloride (KLOR-CON M10) 10 MEQ tablet Take 1 tablet (10 mEq total) by mouth every other day. 09/11/16   Croitoru, Mihai, MD  rivaroxaban (XARELTO) 20 MG TABS tablet Take 1 tablet (20 mg total) by mouth daily. 09/11/16   Croitoru, Mihai, MD  rosuvastatin  (CRESTOR) 20 MG tablet Take 1 tablet (20 mg total) by mouth every evening. 09/11/16   Croitoru, Mihai, MD  UNABLE TO FIND Glucometer - 1  Lancets - 100  Test strips - 100  Alcohol pads - 100 04/10/16   Oswald Hillock, MD     Family History  Problem Relation Age of Onset  . Colon cancer Mother   . Kidney disease Father        ESRF/HD, deceased    Social History   Social History  . Marital status: Married    Spouse name: N/A  . Number of children: N/A  . Years of education: N/A   Social History Main Topics  . Smoking status: Never Smoker  . Smokeless tobacco: Never Used  . Alcohol use No  . Drug use: No  . Sexual activity: Not Currently   Other Topics Concern  . None   Social History Narrative  . None    Review of Systems  Constitutional: Negative for fatigue and fever.  Respiratory: Negative for cough and shortness of breath.   Cardiovascular: Negative for chest pain.  Psychiatric/Behavioral: Negative for behavioral problems and confusion.    Vital Signs: BP (!) 116/59 (BP Location: Right Arm)   Pulse 88   Temp 97.6 F (36.4 C) (Oral)   Resp 18   SpO2 100%   Physical Exam  Constitutional: He is oriented to person, place, and time. He appears well-developed.  Cardiovascular: Normal rate, regular rhythm and normal heart sounds.   Pulmonary/Chest: Effort normal and breath sounds normal. No respiratory distress.  Neurological: He is alert and oriented to person, place, and time.  Skin: Skin is warm and dry.  Scar from previous Port placement, well-healed  Psychiatric: He has a normal mood and affect. His behavior is normal. Judgment and thought content normal.  Nursing note and vitals reviewed.   Mallampati Score:  MD Evaluation Airway: WNL Heart: WNL Abdomen: WNL Chest/ Lungs: WNL ASA  Classification: 3 Mallampati/Airway Score: Two  Imaging: Ct Abdomen Pelvis Wo Contrast  Result Date: 01/22/2017 CLINICAL DATA:  Status post partial colectomy  11/09/2016 for distal transverse colonic adenocarcinoma. Status post chemotherapy completed January 2018. Restaging. EXAM: CT ABDOMEN AND PELVIS WITHOUT CONTRAST TECHNIQUE: Multidetector CT imaging of the abdomen and pelvis was performed following the standard protocol without IV contrast. COMPARISON:  11/08/2015 CT abdomen/ pelvis. FINDINGS: Lower chest: There are numerous (greater than 20) solid pulmonary nodules randomly distributed throughout both lung bases, largest 6 mm in the medial right lower lobe (series 4/image 8) and 6 mm in the anterior left lower lobe (series 4/ image 2), all new since 11/08/2015. Patchy punctate calcifications throughout the right lung base are stable and compatible with remote postinflammatory change. Coronary atherosclerosis. Pacer leads are seen in the right atrium, right ventricle and coronary sinus. Hepatobiliary: Liver is newly enlarged by numerous (greater than 10) new hypodense confluent liver masses scattered throughout the liver. Representative liver masses include a 7.9 x 7.4 cm superior right liver lobe mass (series 3/ image 15) and a 7.7 x 5.3 cm posterior left liver lobe mass (series  3/image 18). Normal gallbladder with no radiopaque cholelithiasis. No biliary ductal dilatation. Pancreas: Normal, with no mass or duct dilation. Spleen: Normal size. No mass. Adrenals/Urinary Tract: Stable appearance of the adrenal glands without discrete adrenal nodules. No hydronephrosis. Nonobstructing 3 mm lower left renal stone. No additional renal stones. Simple 1.7 cm lateral interpolar left renal cyst. Otherwise no contour deforming renal lesions. Normal bladder. Stomach/Bowel: Grossly normal stomach. Normal caliber small bowel with no small bowel wall thickening. Normal appendix. Interval partial transverse colectomy. No discrete mass or wall thickening at the left upper quadrant colonic anastomosis. Mild diverticulosis in the left colon, with no large bowel wall thickening or  pericolonic fat stranding. Oral contrast reaches the rectum. Vascular/Lymphatic: Atherosclerotic nonaneurysmal abdominal aorta. No pathologically enlarged lymph nodes in the abdomen or pelvis. Reproductive: Stable moderate prostatomegaly. Other: No pneumoperitoneum, ascites or focal fluid collection. Musculoskeletal: No aggressive appearing focal osseous lesions. Marked thoracolumbar spondylosis. IMPRESSION: 1. Numerous new hypodense liver masses scattered throughout the liver, most compatible with bulky hepatic metastatic disease. 2. Numerous new solid pulmonary nodules throughout both lung bases, most compatible with pulmonary metastases. 3. No evidence of local tumor recurrence at the colonic anastomosis. No abdominopelvic adenopathy. 4. Additional findings include Aortic Atherosclerosis (ICD10-I70.0), moderate prostatomegaly, mild left colonic diverticulosis and nonobstructing left renal stone. These results will be called to the ordering clinician or representative by the Radiologist Assistant, and communication documented in the PACS or zVision Dashboard. Electronically Signed   By: Ilona Sorrel M.D.   On: 01/22/2017 14:09    Labs:  CBC:  Recent Labs  06/12/16 1235 06/26/16 0756 01/27/17 0941 02/03/17 1033  WBC 4.7 5.3 10.0 12.0*  HGB 10.9* 11.1* 11.0* 10.7*  HCT 32.8* 33.0* 33.5* 31.8*  PLT 159 153 226 187    COAGS:  Recent Labs  02/03/17 1033  INR 1.43  APTT 40*    BMP:  Recent Labs  04/10/16 0415  05/29/16 0932 06/12/16 1236 06/26/16 0756 01/27/17 0941  NA 136  < > 138 141 140 134*  K 3.4*  < > 4.1 4.6 4.1 4.2  CL 103  --   --   --   --   --   CO2 25  < > 25 28 24 23   GLUCOSE 210*  < > 207* 240* 203* 147*  BUN 29*  < > 21.0 22.2 22.0 20.0  CALCIUM 10.0  < > 9.6 10.4 9.9 10.5*  CREATININE 1.99*  < > 1.6* 1.7* 1.6* 1.7*  GFRNONAA 31*  --   --   --   --   --   GFRAA 36*  --   --   --   --   --   < > = values in this interval not displayed.  LIVER FUNCTION  TESTS:  Recent Labs  05/29/16 0932 06/12/16 1236 06/26/16 0756 01/27/17 0941  BILITOT 0.94 0.95 1.15 1.43*  AST 14 16 16  70*  ALT 11 10 10  40  ALKPHOS 86 102 99 266*  PROT 6.9 7.4 7.2 6.8  ALBUMIN 3.3* 3.7 3.5 2.4*    TUMOR MARKERS: No results for input(s): AFPTM, CEA, CA199, CHROMGRNA in the last 8760 hours.  Assessment and Plan: Patient with past medical history of colon cancer and previous chemotherapy treatment via Port presents with complaint of disease progression and desire for Palliative chemotherapy..  IR consulted for Port-A-Cath placement at the request of Dr. Irene Limbo.  He has been NPO and is not currently on blood thinners as he appropriately held  his Xarelto since Friday.   Risks and benefits discussed with the patient including, but not limited to bleeding, infection, pneumothorax, or fibrin sheath development and need for additional procedures. All of the patient's questions were answered, patient is agreeable to proceed. Consent signed and in chart.   Thank you for this interesting consult.  I greatly enjoyed meeting LAVERN MASLOW and look forward to participating in their care.  A copy of this report was sent to the requesting provider on this date.  Electronically Signed: Docia Barrier, PA 02/03/2017, 12:06 PM   I spent a total of  30 Minutes   in face to face in clinical consultation, greater than 50% of which was counseling/coordinating care for colon cancer.

## 2017-02-03 NOTE — Progress Notes (Signed)
Marland Kitchen    HEMATOLOGY/ONCOLOGY CLINIC NOTE  Date of Service:01/27/2017  Patient Care Team: Jonathon Jordan, MD as PCP - General (Family Medicine)  CHIEF COMPLAINTS - follow-up for chemotherapy for colon cancer  Diagnosis: Stage IIIB Colon Cancer diagnosed 11/10/2015  Current treatment  Adjuvant FOLFOX status post 6 cycles. Has subsequently switched to 5-FU leucovorin only due to severe grade 3 fatigue and thrombocytopenia and has completed a total treatment of 9/12 cycles.  Previous treatment -  left hemicolectomy 11/10/2015 - Port-A-Cath placement 12/29/2015  INTERVAL HISTORY  Patient is here for f/u for his colon cancer. He had chosen not to f/u with the cancer center but only with his PCP after his last clinic visit in 06/2016. He has missed an appointment and did not reschedule it. He recently has a f/u CT abd/pelvis with his PCP which showed significant liver and possible lung mets and was referred back to [place] for further evaluation and management. Patient was accompanied by 2 sons and his daughter. He is understandably have a difficult time coming to grips with these new findings and incurable nature of his metastatic disease. We discussed at length goals of care and treatment options.  MEDICAL HISTORY:  Past Medical History:  Diagnosis Date  . CKD (chronic kidney disease) stage 3, GFR 30-59 ml/min   . Complete heart block ([place])    s. 05/02/2015 s/p BSX U128 Val[place] CRT-P Beckie Salts).  Marland Kitchen GIB (gastrointestinal bleeding)    a. 10/2015- Hgb 4.6-->8u PRBC's;  b. 10/2015 EGD: non bleeding H pylori + ulceration; c. 10/2015 Colonoscopy: invasive adenocarcinoma.  . History of cardiomyopathy    a. Nonischemic, possibly tachycardia mediated;  b. 04/2015 Echo:  EF 60-65%, no rwma, mild AI/MR, mod dil LA/RA, PASP 21mHg.  .Marland KitchenHypertension   . Hypertensive heart disease   . OSA (obstructive sleep apnea), pt with apnea during procedures 10/11/2011   Denies  . Paroxysmal atrial fibrillation (HCC)    a. CHA2DS2VASc = 2-3 (Xarelto).  . Stage IIIB Colon Cancer    a. 11/2015 Colonscopy: invasive adenocarcinoma;  b. 11/2015 s/p lap L colectomy; c. Pathology: pT3, pN1c Mx, MSI stable.  . Type 2 diabetes mellitus ([place]     SURGICAL HISTORY: Past Surgical History:  Procedure Laterality Date  . CARDIAC CATHETERIZATION    . CARDIOVERSION  10/11/2011   Procedure: CARDIOVERSION;  Surgeon: DLeonie Man MD;  [place] [facility]  Service: Cardiovascular;  Laterality: N/A;  . COLONOSCOPY N/A 11/05/2015   Procedure: COLONOSCOPY;  Surgeon: RRonald Lobo MD;  [facility];  Service: Endoscopy;  Laterality: N/A;  . EP IMPLANTABLE DEVICE N/A 05/02/2015   Procedure: BiV Pacemaker Insertion CRT-P;  Surgeon: GEvans Lance MD;  [facility];  Service: Cardiovascular;  Laterality: N/A;  . ESOPHAGOGASTRODUODENOSCOPY N/A 11/05/2015   Procedure: ESOPHAGOGASTRODUODENOSCOPY (EGD);  Surgeon: RRonald Lobo MD;  [facility];  Service: Endoscopy;  Laterality: N/A;  . EYE SURGERY  2015   left cataract with lens implant  . LAPAROSCOPIC PARTIAL COLECTOMY N/A 11/10/2015   Procedure: LAPAROSCOPIC LEFT COLECTOMY LAPAROSCOPIC MOBILIZATION OF SPLENIC FLEXURE;  Surgeon: EGreer Pickerel MD;  [place]  Service: General;  Laterality: N/A;  . Nuclear stress  11/01/2011   Severe global hypokinesis,dilated ventricle  . PORTACATH PLACEMENT N/A 12/29/2015   Procedure: INSERTION PORT-A-CATH WITH ULTRASOUND GUIDANCE;  Surgeon: EGreer Pickerel MD;  [facility];  Service: General;  Laterality: N/A;  . TEE WITHOUT CARDIOVERSION  10/10/2011   Procedure: TRANSESOPHAGEAL ECHOCARDIOGRAM (TEE);  Surgeon:  Sanda Klein, MD;  Location: [place] [facility];  Service: Cardiovascular;  Laterality: N/A;    SOCIAL HISTORY: Social History   Social History  . Marital status: Married    Spouse name: N/A  . Number of children: N/A  . Years of education: N/A   Occupational History  . Not on file.   Social  History Main Topics  . Smoking status: Never Smoker  . Smokeless tobacco: Never Used  . Alcohol use No  . Drug use: No  . Sexual activity: Not Currently   Other Topics Concern  . Not on file   Social History Narrative  . No narrative on file    FAMILY HISTORY: Family History  Problem Relation Age of Onset  . Colon cancer Mother   . Kidney disease Father        ESRF/HD, deceased    ALLERGIES:  is allergic to amiodarone and allopurinol.  MEDICATIONS:  Current Outpatient Prescriptions  Medication Sig Dispense Refill  . carvedilol (COREG) 12.5 MG tablet Take 1 tablet (12.5 mg total) by mouth 2 (two) times daily with a meal. 60 tablet 11  . doxazosin (CARDURA) 8 MG tablet Take 1 tablet (8 mg total) by mouth at bedtime. 30 tablet 11  . furosemide (LASIX) 80 MG tablet Take 1 tablet (80 mg total) by mouth daily. 30 tablet 11  . potassium chloride (KLOR-CON M10) 10 MEQ tablet Take 1 tablet (10 mEq total) by mouth every other day. 15 tablet 11  . rivaroxaban (XARELTO) 20 MG TABS tablet Take 1 tablet (20 mg total) by mouth daily. 30 tablet 11  . rosuvastatin (CRESTOR) 20 MG tablet Take 1 tablet (20 mg total) by mouth every evening. 30 tablet 11  . UNABLE TO FIND Glucometer - 1  Lancets - 100  Test strips - 100  Alcohol pads - 100 1 each 0   No current facility-administered medications for this visit.     REVIEW OF SYSTEMS:    10 Point review of Systems was done is negative except as noted above.  PHYSICAL EXAMINATION: ECOG PERFORMANCE STATUS: 2 - Symptomatic, <50% confined to bed   As per EPIC   . Wt Readings from Last 3 Encounters:  01/27/17 222 lb 3.2 oz (100.8 kg)  09/11/16 246 lb 12.8 oz (111.9 kg)  06/28/16 243 lb (110.2 kg)   GENERAL:alert, in no acute distress and comfortable SKIN: skin color, texture, turgor are normal, no rashes or significant lesions EYES: normal, conjunctiva are pink and non-injected, sclera clear OROPHARYNX:no exudate, no erythema and  lips, buccal mucosa, and tongue normal  NECK: supple, no JVD, thyroid normal size, non-tender, without nodularity LYMPH:  no palpable lymphadenopathy in the cervical, axillary or inguinal LUNGS: clear to auscultation with normal respiratory effort HEART: regular rate & rhythm,  no murmurs and no lower extremity edema ABDOMEN: Abdominal surgical incision has healed with minimal superficial crusting remaining . Musculoskeletal: no cyanosis of digits and no clubbing  PSYCH: alert & oriented x 3 with fluent speech NEURO: no focal motor/sensory deficits  LABORATORY DATA:  I have reviewed the data as listed . CBC Latest Ref Rng & Units 01/27/2017 06/26/2016 06/12/2016  WBC 4.0 - 10.3 10e3/uL 10.0 5.3 4.7  Hemoglobin 13.0 - 17.1 g/dL 11.0(L) 11.1(L) 10.9(L)  Hematocrit 38.4 - 49.9 % 33.5(L) 33.0(L) 32.8(L)  Platelets 140 - 400 10e3/uL 226 153 159   . CMP Latest Ref Rng & Units 01/27/2017 06/26/2016 06/12/2016  Glucose 70 - 140 mg/dl 147(H) 203(H) 240(H)  BUN  7.0 - 26.0 mg/dL 20.0 22.0 22.2  Creatinine 0.7 - 1.3 mg/dL 1.7(H) 1.6(H) 1.7(H)  Sodium 136 - 145 mEq/L 134(L) 140 141  Potassium 3.5 - 5.1 mEq/L 4.2 4.1 4.6  Chloride 101 - 111 mmol/L - - -  CO2 22 - 29 mEq/L _0 Calcium 8.4 - 10.4 mg/dL 10.5(H) 9.9 10.4  Total Protein 6.4 - 8.3 g/dL 6.8 7.2 7.4  Total Bilirubin 0.20 - 1.20 mg/dL 1.43(H) 1.15 0.95  Alkaline Phos 40 - 150 U/L 266(H) 99 102  AST 5 - 34 U/L 70(H) 16 16  ALT 0 - 55 U/L 40 10 10    RADIOGRAPHIC STUDIES: I have personally reviewed the radiological images as listed and agreed with the findings in the report. Ct Abdomen Pelvis Wo Contrast  Result Date: 01/22/2017 CLINICAL DATA:  Status post partial colectomy 11/09/2016 for distal transverse colonic adenocarcinoma. Status post chemotherapy completed January 2018. Restaging. EXAM: CT ABDOMEN AND PELVIS WITHOUT CONTRAST TECHNIQUE: Multidetector CT imaging of the abdomen and pelvis was performed following the standard  protocol without IV contrast. COMPARISON:  11/08/2015 CT abdomen/ pelvis. FINDINGS: Lower chest: There are numerous (greater than 20) solid pulmonary nodules randomly distributed throughout both lung bases, largest 6 mm in the medial right lower lobe (series 4/image 8) and 6 mm in the anterior left lower lobe (series 4/ image 2), all new since 11/08/2015. Patchy punctate calcifications throughout the right lung base are stable and compatible with remote postinflammatory change. Coronary atherosclerosis. Pacer leads are seen in the right atrium, right ventricle and coronary sinus. Hepatobiliary: Liver is newly enlarged by numerous (greater than 10) new hypodense confluent liver masses scattered throughout the liver. Representative liver masses include a 7.9 x 7.4 cm superior right liver lobe mass (series 3/ image 15) and a 7.7 x 5.3 cm posterior left liver lobe mass (series 3/image 18). Normal gallbladder with no radiopaque cholelithiasis. No biliary ductal dilatation. Pancreas: Normal, with no mass or duct dilation. Spleen: Normal size. No mass. Adrenals/Urinary Tract: Stable appearance of the adrenal glands without discrete adrenal nodules. No hydronephrosis. Nonobstructing 3 mm lower left renal stone. No additional renal stones. Simple 1.7 cm lateral interpolar left renal cyst. Otherwise no contour deforming renal lesions. Normal bladder. Stomach/Bowel: Grossly normal stomach. Normal caliber small bowel with no small bowel wall thickening. Normal appendix. Interval partial transverse colectomy. No discrete mass or wall thickening at the left upper quadrant colonic anastomosis. Mild diverticulosis in the left colon, with no large bowel wall thickening or pericolonic fat stranding. Oral contrast reaches the rectum. Vascular/Lymphatic: Atherosclerotic nonaneurysmal abdominal aorta. No pathologically enlarged lymph nodes in the abdomen or pelvis. Reproductive: Stable moderate prostatomegaly. Other: No  pneumoperitoneum, ascites or focal fluid collection. Musculoskeletal: No aggressive appearing focal osseous lesions. Marked thoracolumbar spondylosis. IMPRESSION: 1. Numerous new hypodense liver masses scattered throughout the liver, most compatible with bulky hepatic metastatic disease. 2. Numerous new solid pulmonary nodules throughout both lung bases, most compatible with pulmonary metastases. 3. No evidence of local tumor recurrence at the colonic anastomosis. No abdominopelvic adenopathy. 4. Additional findings include Aortic Atherosclerosis (ICD10-I70.0), moderate prostatomegaly, mild left colonic diverticulosis and nonobstructing left renal stone. These results will be called to the ordering clinician or representative by the Radiologist Assistant, and communication documented in the PACS or zVision Dashboard. Electronically Signed   By: Ilona Sorrel M.D.   On: 01/22/2017 14:09    ASSESSMENT & PLAN:   76 yo caucasian male with   1) Newly diagnosed Metastatic Colon  Cancer with extensive liver metastases and likely lung mets. . Lab Results  Component Value Date   CEA 1.2 11/06/2015   Component     Latest Ref Rng & Units 01/27/2017  CEA ([facility])     0.00 - 5.00 ng/mL 1,264.82 (H)  CEA     0.0 - 4.7 ng/mL 2,344.0 (H)    Previously noted to have Stage IIIB (pT3, pN1c, Mx) Colon Adenocarcinoma involving with transverse colon. Patient is s/p left sided colectomy on 11/10/2015 .  Patient is status post 6 cycles of FOLFOX Received 5-FU leucovorin for cycle 7-12 and tolerated this much better without significant thrombocytopenia or fatigue. 2) fatigue-from metastatic malignancy with weight loss. (24lbs weight loss over the last 3-4 months) . Wt Readings from Last 3 Encounters:  01/27/17 222 lb 3.2 oz (100.8 kg)  09/11/16 246 lb 12.8 oz (111.9 kg)  06/28/16 243 lb (110.2 kg)   3) Abnormal LFTs -from liver metastases from colon cancer 4 hypercalcemia likely due to dehydration.  Resolved   Plan -discussed the CEA and CT abd/pelvis findings consistent with newly noted metastatic colon cancer. -we spent a significant amount of time in determining goals of care considering that this is an incurable situation. -patient wanted to think about Best supportive cares vs palliative chemotherapy options and discuss this with his family. -his son later called and informed us that her is ok with port-a-cath replacement and would like to consider palliative treatment options. -we discussed and decided to proceed with FOLFIRI + (avastin vs panitumumab - based on foundation studies) -foundation one studies ordered on his previous pathology results.  8) HTN - controlled.  9) DM2- uncontrolled  10) Non ischemic chronic diastolic CHF 11) P. a fib on Xarelto per cardiology - will need to be mindful of his renal function and watch for bleeding. 12) CKD 3 monitoring kidney function - is also on ACE inhibitor as per primary care physician and diuretics which will need to be monitored closely. 14) h/o  Duodenal ulceration (h pylori -positive) - completed Abx for rx. On PPI  PLAN -continue f/u with PCP for management of other medical problems.  Port-a-cath placement Will plan to proceed with palliative chemotherapy after long weekend later in the week of 9/3 as per patients preference.   I spent 30 minutes counseling the patient face to face. The total time spent in the appointment was 40 minutes and more than 50% was on counseling and direct patient cares    Sullivan Lone MD [place] AAHIV[facility] [place] [facility] Hematology/Oncology Physician [facility]  (Office):       [phone] (Work cell):  [phone] (Fax):           [phone]

## 2017-02-05 ENCOUNTER — Telehealth: Payer: Self-pay

## 2017-02-05 ENCOUNTER — Telehealth: Payer: Self-pay | Admitting: Hematology

## 2017-02-05 NOTE — Telephone Encounter (Signed)
sw pt to confirm 9/11 appt at 0915 per sch msg

## 2017-02-05 NOTE — Telephone Encounter (Signed)
Pt called about his appts.  He mentioned he is getting weak. S/w Son and the pt has not had much desire to eat since he got sick during his CT 8/15. The weakness has been worse in the last 7-10 days. Pt is drinking 4-5 bottles worth of water every day. Did encourage boost drink, or protein powder added to smoothies or small portions of food throughout the day rather than a plated meal.  Son was concerned if he continues on this path of weakness he may not be able to start chemo on 9/11.

## 2017-02-11 ENCOUNTER — Ambulatory Visit
Admit: 2017-02-11 | Discharge: 2017-02-11 | Disposition: A | Discharge status: Home / Self Care | Payer: Medicare Other | Attending: Radiation Oncology | Admitting: Radiation Oncology

## 2017-02-11 ENCOUNTER — Emergency Department (HOSPITAL_COMMUNITY): Payer: Medicare Other

## 2017-02-11 ENCOUNTER — Telehealth: Payer: Self-pay

## 2017-02-11 ENCOUNTER — Encounter (HOSPITAL_COMMUNITY): Payer: Self-pay

## 2017-02-11 ENCOUNTER — Inpatient Hospital Stay (HOSPITAL_COMMUNITY)
Admission: EM | Admit: 2017-02-11 | Discharge: 2017-02-18 | DRG: 682 | Disposition: A | Discharge status: Hospice | Payer: Medicare Other | Attending: Internal Medicine | Admitting: Internal Medicine

## 2017-02-11 DIAGNOSIS — E785 Hyperlipidemia, unspecified: Secondary | ICD-10-CM | POA: Diagnosis present

## 2017-02-11 DIAGNOSIS — Z7901 Long term (current) use of anticoagulants: Secondary | ICD-10-CM

## 2017-02-11 DIAGNOSIS — Z85038 Personal history of other malignant neoplasm of large intestine: Secondary | ICD-10-CM

## 2017-02-11 DIAGNOSIS — C787 Secondary malignant neoplasm of liver and intrahepatic bile duct: Secondary | ICD-10-CM | POA: Diagnosis present

## 2017-02-11 DIAGNOSIS — R531 Weakness: Secondary | ICD-10-CM

## 2017-02-11 DIAGNOSIS — C189 Malignant neoplasm of colon, unspecified: Secondary | ICD-10-CM

## 2017-02-11 DIAGNOSIS — T380X5A Adverse effect of glucocorticoids and synthetic analogues, initial encounter: Secondary | ICD-10-CM | POA: Diagnosis not present

## 2017-02-11 DIAGNOSIS — R748 Abnormal levels of other serum enzymes: Secondary | ICD-10-CM | POA: Diagnosis present

## 2017-02-11 DIAGNOSIS — R64 Cachexia: Secondary | ICD-10-CM | POA: Diagnosis present

## 2017-02-11 DIAGNOSIS — Z7189 Other specified counseling: Secondary | ICD-10-CM

## 2017-02-11 DIAGNOSIS — I959 Hypotension, unspecified: Secondary | ICD-10-CM | POA: Diagnosis present

## 2017-02-11 DIAGNOSIS — N183 Chronic kidney disease, stage 3 (moderate): Secondary | ICD-10-CM | POA: Diagnosis present

## 2017-02-11 DIAGNOSIS — I5042 Chronic combined systolic (congestive) and diastolic (congestive) heart failure: Secondary | ICD-10-CM | POA: Diagnosis present

## 2017-02-11 DIAGNOSIS — N189 Chronic kidney disease, unspecified: Secondary | ICD-10-CM | POA: Diagnosis not present

## 2017-02-11 DIAGNOSIS — E871 Hypo-osmolality and hyponatremia: Secondary | ICD-10-CM | POA: Diagnosis present

## 2017-02-11 DIAGNOSIS — N170 Acute kidney failure with tubular necrosis: Principal | ICD-10-CM | POA: Diagnosis present

## 2017-02-11 DIAGNOSIS — Z66 Do not resuscitate: Secondary | ICD-10-CM | POA: Diagnosis present

## 2017-02-11 DIAGNOSIS — Z515 Encounter for palliative care: Secondary | ICD-10-CM | POA: Diagnosis present

## 2017-02-11 DIAGNOSIS — C7802 Secondary malignant neoplasm of left lung: Secondary | ICD-10-CM | POA: Diagnosis present

## 2017-02-11 DIAGNOSIS — G9520 Unspecified cord compression: Secondary | ICD-10-CM

## 2017-02-11 DIAGNOSIS — I480 Paroxysmal atrial fibrillation: Secondary | ICD-10-CM | POA: Diagnosis present

## 2017-02-11 DIAGNOSIS — E872 Acidosis: Secondary | ICD-10-CM | POA: Diagnosis present

## 2017-02-11 DIAGNOSIS — Z6830 Body mass index (BMI) 30.0-30.9, adult: Secondary | ICD-10-CM

## 2017-02-11 DIAGNOSIS — R109 Unspecified abdominal pain: Secondary | ICD-10-CM | POA: Diagnosis not present

## 2017-02-11 DIAGNOSIS — R52 Pain, unspecified: Secondary | ICD-10-CM | POA: Diagnosis not present

## 2017-02-11 DIAGNOSIS — Z9221 Personal history of antineoplastic chemotherapy: Secondary | ICD-10-CM

## 2017-02-11 DIAGNOSIS — E43 Unspecified severe protein-calorie malnutrition: Secondary | ICD-10-CM | POA: Diagnosis present

## 2017-02-11 DIAGNOSIS — R609 Edema, unspecified: Secondary | ICD-10-CM | POA: Diagnosis not present

## 2017-02-11 DIAGNOSIS — D696 Thrombocytopenia, unspecified: Secondary | ICD-10-CM | POA: Diagnosis not present

## 2017-02-11 DIAGNOSIS — I130 Hypertensive heart and chronic kidney disease with heart failure and stage 1 through stage 4 chronic kidney disease, or unspecified chronic kidney disease: Secondary | ICD-10-CM | POA: Diagnosis present

## 2017-02-11 DIAGNOSIS — Z950 Presence of cardiac pacemaker: Secondary | ICD-10-CM

## 2017-02-11 DIAGNOSIS — N179 Acute kidney failure, unspecified: Secondary | ICD-10-CM

## 2017-02-11 DIAGNOSIS — N184 Chronic kidney disease, stage 4 (severe): Secondary | ICD-10-CM | POA: Diagnosis present

## 2017-02-11 DIAGNOSIS — R5383 Other fatigue: Secondary | ICD-10-CM | POA: Diagnosis not present

## 2017-02-11 DIAGNOSIS — E1140 Type 2 diabetes mellitus with diabetic neuropathy, unspecified: Secondary | ICD-10-CM | POA: Diagnosis present

## 2017-02-11 DIAGNOSIS — E860 Dehydration: Secondary | ICD-10-CM | POA: Diagnosis present

## 2017-02-11 DIAGNOSIS — R740 Nonspecific elevation of levels of transaminase and lactic acid dehydrogenase [LDH]: Secondary | ICD-10-CM | POA: Diagnosis present

## 2017-02-11 DIAGNOSIS — Z6827 Body mass index (BMI) 27.0-27.9, adult: Secondary | ICD-10-CM | POA: Diagnosis not present

## 2017-02-11 DIAGNOSIS — C7801 Secondary malignant neoplasm of right lung: Secondary | ICD-10-CM | POA: Diagnosis present

## 2017-02-11 DIAGNOSIS — R278 Other lack of coordination: Secondary | ICD-10-CM | POA: Diagnosis not present

## 2017-02-11 DIAGNOSIS — N19 Unspecified kidney failure: Secondary | ICD-10-CM

## 2017-02-11 DIAGNOSIS — R945 Abnormal results of liver function studies: Secondary | ICD-10-CM | POA: Diagnosis not present

## 2017-02-11 DIAGNOSIS — G9349 Other encephalopathy: Secondary | ICD-10-CM | POA: Diagnosis not present

## 2017-02-11 DIAGNOSIS — R627 Adult failure to thrive: Secondary | ICD-10-CM | POA: Diagnosis present

## 2017-02-11 DIAGNOSIS — R634 Abnormal weight loss: Secondary | ICD-10-CM | POA: Diagnosis not present

## 2017-02-11 DIAGNOSIS — R9431 Abnormal electrocardiogram [ECG] [EKG]: Secondary | ICD-10-CM | POA: Diagnosis not present

## 2017-02-11 LAB — URINALYSIS, ROUTINE W REFLEX MICROSCOPIC
BACTERIA UA: NONE SEEN
BILIRUBIN URINE: NEGATIVE
Glucose, UA: NEGATIVE mg/dL
Ketones, ur: NEGATIVE mg/dL
Leukocytes, UA: NEGATIVE
NITRITE: NEGATIVE
PH: 5 (ref 5.0–8.0)
Protein, ur: 100 mg/dL — AB
SPECIFIC GRAVITY, URINE: 1.013 (ref 1.005–1.030)

## 2017-02-11 LAB — COMPREHENSIVE METABOLIC PANEL
ALBUMIN: 2 g/dL — AB (ref 3.5–5.0)
ALT: 73 U/L — ABNORMAL HIGH (ref 17–63)
ANION GAP: 11 (ref 5–15)
AST: 230 U/L — AB (ref 15–41)
Alkaline Phosphatase: 446 U/L — ABNORMAL HIGH (ref 38–126)
BILIRUBIN TOTAL: 2.2 mg/dL — AB (ref 0.3–1.2)
BUN: 38 mg/dL — AB (ref 6–20)
CHLORIDE: 104 mmol/L (ref 101–111)
CO2: 18 mmol/L — AB (ref 22–32)
Calcium: 9.3 mg/dL (ref 8.9–10.3)
Creatinine, Ser: 3.22 mg/dL — ABNORMAL HIGH (ref 0.61–1.24)
GFR calc Af Amer: 20 mL/min — ABNORMAL LOW (ref >60)
GFR calc non Af Amer: 17 mL/min — ABNORMAL LOW (ref >60)
GLUCOSE: 98 mg/dL (ref 65–99)
POTASSIUM: 4 mmol/L (ref 3.5–5.1)
SODIUM: 133 mmol/L — AB (ref 135–145)
TOTAL PROTEIN: 6.2 g/dL — AB (ref 6.5–8.1)

## 2017-02-11 LAB — CBC WITH DIFFERENTIAL/PLATELET
BASOS PCT: 0 %
Basophils Absolute: 0 10*3/uL (ref 0.0–0.1)
EOS PCT: 4 %
Eosinophils Absolute: 0.6 10*3/uL (ref 0.0–0.7)
HEMATOCRIT: 28.1 % — AB (ref 39.0–52.0)
HEMOGLOBIN: 9.5 g/dL — AB (ref 13.0–17.0)
LYMPHS ABS: 1 10*3/uL (ref 0.7–4.0)
LYMPHS PCT: 7 %
MCH: 22.7 pg — AB (ref 26.0–34.0)
MCHC: 33.8 g/dL (ref 30.0–36.0)
MCV: 67.1 fL — AB (ref 78.0–100.0)
MONOS PCT: 12 %
Monocytes Absolute: 1.7 10*3/uL — ABNORMAL HIGH (ref 0.1–1.0)
NEUTROS ABS: 10.5 10*3/uL — AB (ref 1.7–7.7)
Neutrophils Relative %: 77 %
Platelets: 148 10*3/uL — ABNORMAL LOW (ref 150–400)
RBC: 4.19 MIL/uL — ABNORMAL LOW (ref 4.22–5.81)
RDW: 18.5 % — AB (ref 11.5–15.5)
WBC: 13.8 10*3/uL — ABNORMAL HIGH (ref 4.0–10.5)

## 2017-02-11 LAB — BRAIN NATRIURETIC PEPTIDE: B NATRIURETIC PEPTIDE 5: 169.6 pg/mL — AB (ref 0.0–100.0)

## 2017-02-11 LAB — TROPONIN I
Troponin I: 0.09 ng/mL (ref <0.03)
Troponin I: 0.04 ng/mL (ref <0.03)

## 2017-02-11 MED ORDER — DOXAZOSIN MESYLATE 4 MG PO TABS
8.0000 mg | ORAL_TABLET | Freq: Every day | ORAL | Status: DC
  Administered 2017-02-11: 8 mg via ORAL
  Filled 2017-02-11: qty 2

## 2017-02-11 MED ORDER — ACETAMINOPHEN 500 MG PO TABS
1000.0000 mg | ORAL_TABLET | Freq: Four times a day (QID) | ORAL | Status: DC | PRN
  Administered 2017-02-12: 1000 mg via ORAL
  Filled 2017-02-11: qty 2

## 2017-02-11 MED ORDER — SODIUM CHLORIDE 0.9 % IV BOLUS (SEPSIS)
1000.0000 mL | Freq: Once | INTRAVENOUS | Status: AC
  Administered 2017-02-11: 1000 mL via INTRAVENOUS

## 2017-02-11 MED ORDER — DEXTROSE-NACL 5-0.45 % IV SOLN
INTRAVENOUS | Status: DC
  Administered 2017-02-11: 11:00:00 via INTRAVENOUS

## 2017-02-11 MED ORDER — DEXTROSE 5 % IV SOLN
1.0000 g | Freq: Once | INTRAVENOUS | Status: AC
  Administered 2017-02-11: 1 g via INTRAVENOUS
  Filled 2017-02-11: qty 10

## 2017-02-11 MED ORDER — DEXAMETHASONE SODIUM PHOSPHATE 10 MG/ML IJ SOLN
10.0000 mg | Freq: Once | INTRAMUSCULAR | Status: AC
  Administered 2017-02-11: 10 mg via INTRAVENOUS
  Filled 2017-02-11: qty 1

## 2017-02-11 MED ORDER — SODIUM CHLORIDE 0.9% FLUSH
10.0000 mL | Freq: Two times a day (BID) | INTRAVENOUS | Status: DC
  Administered 2017-02-13 – 2017-02-17 (×3): 10 mL

## 2017-02-11 MED ORDER — SODIUM CHLORIDE 0.9% FLUSH
10.0000 mL | INTRAVENOUS | Status: DC | PRN
  Administered 2017-02-12 – 2017-02-14 (×2): 10 mL
  Filled 2017-02-11 (×2): qty 40

## 2017-02-11 MED ORDER — DEXAMETHASONE SODIUM PHOSPHATE 4 MG/ML IJ SOLN
4.0000 mg | Freq: Four times a day (QID) | INTRAMUSCULAR | Status: DC
  Administered 2017-02-11 – 2017-02-12 (×5): 4 mg via INTRAVENOUS
  Filled 2017-02-11 (×5): qty 1

## 2017-02-11 MED ORDER — MORPHINE SULFATE 15 MG PO TABS
15.0000 mg | ORAL_TABLET | ORAL | Status: DC | PRN
  Administered 2017-02-14 – 2017-02-16 (×3): 15 mg via ORAL
  Filled 2017-02-11 (×4): qty 1

## 2017-02-11 MED ORDER — POLYETHYLENE GLYCOL 3350 17 G PO PACK: 17.0000 g | PACK | Freq: Every day | ORAL | Status: DC | PRN

## 2017-02-11 MED ORDER — BISACODYL 10 MG RE SUPP: 10.0000 mg | Freq: Every day | RECTAL | Status: DC | PRN

## 2017-02-11 MED ORDER — ROSUVASTATIN CALCIUM 10 MG PO TABS
10.0000 mg | ORAL_TABLET | Freq: Every evening | ORAL | Status: DC
  Administered 2017-02-11: 10 mg via ORAL
  Filled 2017-02-11: qty 1

## 2017-02-11 MED ORDER — FLEET ENEMA 7-19 GM/118ML RE ENEM: 1.0000 | ENEMA | Freq: Once | RECTAL | Status: DC | PRN

## 2017-02-11 MED ORDER — MORPHINE SULFATE (PF) 4 MG/ML IV SOLN
4.0000 mg | Freq: Once | INTRAVENOUS | Status: AC
  Administered 2017-02-11: 4 mg via INTRAVENOUS
  Filled 2017-02-11: qty 1

## 2017-02-11 MED ORDER — CARVEDILOL 12.5 MG PO TABS
12.5000 mg | ORAL_TABLET | Freq: Two times a day (BID) | ORAL | Status: DC
  Administered 2017-02-11 – 2017-02-12 (×2): 12.5 mg via ORAL
  Filled 2017-02-11 (×2): qty 1

## 2017-02-11 MED ORDER — SODIUM CHLORIDE 0.9 % IV SOLN
INTRAVENOUS | Status: DC
  Administered 2017-02-12: 05:00:00 via INTRAVENOUS

## 2017-02-11 MED ORDER — ROSUVASTATIN CALCIUM 20 MG PO TABS
20.0000 mg | ORAL_TABLET | Freq: Every evening | ORAL | Status: DC
Start: 2017-02-11 — End: 2017-02-11

## 2017-02-11 NOTE — ED Notes (Signed)
Call report to [place] at [phone] at [facility]

## 2017-02-11 NOTE — ED Triage Notes (Signed)
Patient c/o mid abdominal pain x 3 weeks and was diagnosed with reoccurence of cancer and is supposed to start chemo in a week.

## 2017-02-11 NOTE — H&P (Signed)
History and Physical:    Ernest Lara   SNK:[id number] DOB: May 25, 1941 DOA: 02/11/2017  Referring MD/provider: Dr. Ayesha Rumpf PCP: Jonathon Jordan, MD   Patient coming from: Home  Chief Complaint: "I'm having a hard time walking".  History of Present Illness:   Ernest Lara is an 76 y.o. male with widely metastatic colon cancer who presents with difficulty walking and increased lower back pain. Patient states a month ago he was walking around his neighborhood without any difficulty. Since then he feels he has gone downhill significantly. He attributes this to his 2 CAT scans that he has had which have revealed spread of colon cancer worse than he had anticipated. Patient notes that he has baseline lower back pain but over the past 2 weeks his back pain has increased significantly. He also notes that he is having a hard time feeling his feet when they hit the floor and feels that his feet are heavy. He feels that he has been stumbling which is unusual for him. He now comes in stating that he is having a hard time walking at all as he is unable to get up out of a chair or even out of bed.  Patient denies fevers or chills. Denies any bowel or bladder problems. Denies recent falls or back injury.  ED Course:  The patient was noted to be relatively hypotensive and be in acute renal failure with creatinine of 3. CT imaging of the spinal cord was not possible due to elevated creatinine and MRI was not possible due to the fact that patient has a pacemaker. Decadron was given for possible spinal cord compression.  Of note when I went to examine patient after he got the Decadron, patient states his lower extremities were much stronger and he was able to flex at the hip where he was not able to do that before  ROS:   ROS 10 point review of systems otherwise negative unless as mentioned in history of present illness.  Past Medical History:   Past Medical History:  Diagnosis Date  . CKD  (chronic kidney disease) stage 3, GFR 30-59 ml/min    kidney function concerns with chemo  . Complete heart block ([place])    s. 05/02/2015 s/p BSX U128 [place] CRT-P Beckie Salts).  Marland Kitchen GIB (gastrointestinal bleeding)    a. 10/2015- Hgb 4.6-->8u PRBC's;  b. 10/2015 EGD: non bleeding H pylori + ulceration; c. 10/2015 Colonoscopy: invasive adenocarcinoma.  . History of cardiomyopathy    a. Nonischemic, possibly tachycardia mediated;  b. 04/2015 Echo:  EF 60-65%, no rwma, mild AI/MR, mod dil LA/RA, PASP 39mHg.  .Marland KitchenHypertension   . Hypertensive heart disease   . Paroxysmal atrial fibrillation (HCC)    a. CHA2DS2VASc = 2-3 (Xarelto).  . Stage IIIB Colon Cancer    a. 11/2015 Colonscopy: invasive adenocarcinoma;  b. 11/2015 s/p lap L colectomy; c. Pathology: pT3, pN1c Mx, MSI stable.  . [place] 2 diabetes mellitus [place]     Past Surgical History:   Past Surgical History:  Procedure Laterality Date  . CARDIAC CATHETERIZATION    . CARDIOVERSION  10/11/2011   Procedure: CARDIOVERSION;  Surgeon: DLeonie Man MD;  [facility] [facility]  Service: Cardiovascular;  Laterality: N/A;  . COLONOSCOPY N/A 11/05/2015   Procedure: COLONOSCOPY;  Surgeon: RRonald Lobo MD;  [facility];  Service: Endoscopy;  Laterality: N/A;  . EP IMPLANTABLE DEVICE N/A 05/02/2015   Procedure: BiV Pacemaker Insertion CRT-P;  Surgeon: GEvans Lance MD;  Location: [facility] CV LAB;  Service: Cardiovascular;  Laterality: N/A;  . ESOPHAGOGASTRODUODENOSCOPY N/A 11/05/2015   Procedure: ESOPHAGOGASTRODUODENOSCOPY (EGD);  Surgeon: Ronald Lobo, MD;  Location: [facility] ENDOSCOPY;  Service: Endoscopy;  Laterality: N/A;  . EYE SURGERY  2015   left cataract with lens implant  . IR FLUORO GUIDE PORT INSERTION RIGHT  02/03/2017  . IR US GUIDE VASC ACCESS RIGHT  02/03/2017  . LAPAROSCOPIC PARTIAL COLECTOMY N/A 11/10/2015   Procedure: LAPAROSCOPIC LEFT COLECTOMY LAPAROSCOPIC MOBILIZATION OF SPLENIC FLEXURE;  Surgeon: Greer Pickerel, MD;  Location:  [facility];  Service: General;  Laterality: N/A;  . Nuclear stress  11/01/2011   Severe global hypokinesis,dilated ventricle  . PORTACATH PLACEMENT N/A 12/29/2015   Procedure: INSERTION PORT-A-CATH WITH ULTRASOUND GUIDANCE;  Surgeon: Greer Pickerel, MD;  Location: [facility];  Service: General;  Laterality: N/A;  . TEE WITHOUT CARDIOVERSION  10/10/2011   Procedure: TRANSESOPHAGEAL ECHOCARDIOGRAM (TEE);  Surgeon: Sanda Klein, MD;  Location: [facility] END[facility]COPY;  Service: Cardiovascular;  Laterality: N/A;    Social History:   Social History   Social History  . Marital status: Married    Spouse name: N/A  . Number of children: N/A  . Years of education: N/A   Occupational History  . Not on file.   Social History Main Topics  . Smoking status: Never Smoker  . Smokeless tobacco: Never Used  . Alcohol use No  . Drug use: No  . Sexual activity: Not Currently   Other Topics Concern  . Not on file   Social History Narrative  . No narrative on file    Allergies   Amiodarone and Allopurinol  Family history:   Family History  Problem Relation Age of Onset  . Colon cancer Mother   . Kidney disease Father        ESRF/HD, deceased    Current Medications:   Prior to Admission medications   Medication Sig Start Date End Date Taking? Authorizing Provider  acetaminophen (TYLENOL) 500 MG tablet Take 1,000 mg by mouth every 6 (six) hours as needed for mild pain.   Yes [provider]  carvedilol (COREG) 12.5 MG tablet Take 1 tablet (12.5 mg total) by mouth 2 (two) times daily with a meal. 09/11/16  Yes Croitoru, Mihai, MD  doxazosin (CARDURA) 8 MG tablet Take 1 tablet (8 mg total) by mouth at bedtime. 09/11/16  Yes Croitoru, Mihai, MD  furosemide (LASIX) 80 MG tablet Take 1 tablet (80 mg total) by mouth daily. 09/11/16  Yes Croitoru, Mihai, MD  potassium chloride (KLOR-CON M10) 10 MEQ tablet Take 1 tablet (10 mEq total) by mouth every other day. 09/11/16  Yes Croitoru, Mihai, MD  rosuvastatin  (CRESTOR) 20 MG tablet Take 1 tablet (20 mg total) by mouth every evening. 09/11/16  Yes Croitoru, Mihai, MD  rivaroxaban (XARELTO) 20 MG TABS tablet Take 1 tablet (20 mg total) by mouth daily. Patient not taking: Reported on 02/11/2017 09/11/16   Croitoru, Mihai, MD  UNABLE TO FIND Glucometer - 1  Lancets - 100  Test strips - 100  Alcohol pads - 100 04/10/16   Oswald Hillock, MD    Physical Exam:   Vitals:   02/11/17 0858 02/11/17 0901 02/11/17 1146 02/11/17 1508  BP: (!) 99/56  99/62 135/70  Pulse: 91  61 63  Resp: 16  14 14   Temp: 97.6 F (36.4 C)     TempSrc: Oral     SpO2: 98%  95% 100%  Weight:  100.7 kg (222  lb)    Height:  6' 3"  (1.905 m)       Physical Exam: Blood pressure 135/70, pulse 63, temperature 97.6 F (36.4 C), temperature source Oral, resp. rate 14, height 6' 3"  (1.905 m), weight 100.7 kg (222 lb), SpO2 100 %. Gen: Pale tired and weak-appearing man lying in bed with daughter at bedside. Eyes: Sclerae anicteric. Conjunctiva mildly injected. Neck: Supple, no jugular venous distention. Chest: Moderately good air entry bilaterally with no adventitious sounds.  CV: Distant, regular, no audible murmurs. Abdomen: NABS, soft, nondistended, nontender. No tenderness to light or deep palpation. No rebound, no guarding. Extremities: Patient with 4-/ 5 flexion at the hip bilaterally, 4/5 extension at the knee bilaterally 5/5 plantar flexion bilaterally. I am unable to elicit any reflexes bilaterally, no clonus noted. Skin: Warm and dry. No rashes, lesions or wounds. Neuro: Alert and oriented times 3; grossly nonfocal. Psych: Patient is cooperative, logical and coherent with appropriately sad mood and affect.  Data Review:    Labs: Basic Metabolic Panel:  Recent Labs Lab 02/11/17 0940  NA 133*  K 4.0  CL 104  CO2 18*  GLUCOSE 98  BUN 38*  CREATININE 3.22*  CALCIUM 9.3   Liver Function Tests:  Recent Labs Lab 02/11/17 0940  AST 230*  ALT 73*  ALKPHOS  446*  BILITOT 2.2*  PROT 6.2*  ALBUMIN 2.0*   No results for input(s): LIPASE, AMYLASE in the last 168 hours. No results for input(s): AMMONIA in the last 168 hours. CBC:  Recent Labs Lab 02/11/17 0940  WBC 13.8*  NEUTROABS 10.5*  HGB 9.5*  HCT 28.1*  MCV 67.1*  PLT 148*   Cardiac Enzymes:  Recent Labs Lab 02/11/17 0940  TROPONINI 0.09*    BNP (last 3 results) No results for input(s): PROBNP in the last 8760 hours. CBG: No results for input(s): GLUCAP in the last 168 hours.  Urinalysis    Component Value Date/Time   COLORURINE AMBER (A) 02/11/2017 1510   APPEARANCEUR CLOUDY (A) 02/11/2017 1510   LABSPEC 1.013 02/11/2017 1510   PHURINE 5.0 02/11/2017 1510   GLUCOSEU NEGATIVE 02/11/2017 1510   HGBUR MODERATE (A) 02/11/2017 1510   BILIRUBINUR NEGATIVE 02/11/2017 1510   KETONESUR NEGATIVE 02/11/2017 1510   PROTEINUR 100 (A) 02/11/2017 1510   NITRITE NEGATIVE 02/11/2017 1510   LEUKOCYTESUR NEGATIVE 02/11/2017 1510      Radiographic Studies: Ct Abdomen Pelvis Wo Contrast  Result Date: 02/11/2017 CLINICAL DATA:  Abdominal distention.  Colon cancer. EXAM: CT CHEST, ABDOMEN AND PELVIS WITHOUT CONTRAST TECHNIQUE: Multidetector CT imaging of the chest, abdomen and pelvis was performed following the standard protocol without IV contrast. COMPARISON:  CT 01/22/2017.  Chest CT 11/08/2015 FINDINGS: CT CHEST FINDINGS Cardiovascular: Pacer wires noted in the right heart. Severe diffuse coronary artery calcifications. Scattered aortic calcifications. No aneurysm. Heart is normal size. Mediastinum/Nodes: Innumerable bilateral small pulmonary nodules compatible with diffuse metastatic involvement of the lungs. Small right pleural effusion. Lungs/Pleura: No mediastinal, hilar, or axillary adenopathy. Musculoskeletal: Chest wall soft tissues are unremarkable. Degenerative changes in the thoracic spine. Diffuse sclerotic appearance of the bony structures without focal lytic lesion or  destructive process. CT ABDOMEN PELVIS FINDINGS Hepatobiliary: Innumerable large masses throughout the liver, difficult to measure due to the noncontrast nature of the study. Findings are likely similar to recent abdominal CT. Gallbladder unremarkable. Pancreas: No focal abnormality or ductal dilatation. Spleen: Small low-density lesion in the posterior spleen on image 54 measures 13 mm. Cannot exclude metastasis. Adrenals/Urinary Tract: Small  low-density lesions in the kidneys bilaterally, stable, likely small cysts. Punctate nonobstructing stone in the lower pole of the left kidney. Adrenal glands and urinary bladder unremarkable. Stomach/Bowel: Postoperative changes in the distal transverse colon near the splenic flexure. Colonic diverticulosis. No visible active diverticulitis. Stomach and small bowel decompressed, unremarkable. Vascular/Lymphatic: Aortic and iliac calcifications. No aneurysm or adenopathy. Reproductive: Prostate enlargement. Other: No free air. Free fluid adjacent to the liver and extending in the right paracolic gutter and right pelvis. Musculoskeletal: No focal lytic or destructive bone lesion. Diffuse degenerative changes in the lumbar spine. Diffuse sclerotic appearance of the bones. IMPRESSION: Innumerable/diffuse nodules throughout the lungs compatible with metastatic disease. Small right pleural effusion. Extensive coronary artery disease. Innumerable large hepatic metastases, similar to prior study. Small hypodensity in the spleen, cannot exclude metastasis. Left lower pole nephrolithiasis. Free fluid adjacent to the liver and in the right abdomen/ pelvis. Diffuse sclerotic appearance of the bony structures without visible destructive process or lytic lesion. Cannot completely exclude diffuse metastatic involvement. Consider further evaluation with bone scan. Electronically Signed   By: Rolm Baptise M.D.   On: 02/11/2017 11:40   Dg Chest 2 View  Result Date: 02/11/2017 CLINICAL DATA:   Weakness, shortness of Breath EXAM: CHEST  2 VIEW COMPARISON:  12/29/2015 FINDINGS: Right Port-A-Cath and left pacer remain in place, unchanged. Patchy bilateral airspace opacities are noted, new since prior study. Heart is upper limits normal in size. No effusions or acute bony abnormality. IMPRESSION: New patchy bilateral airspace disease which could reflect edema or infection. Electronically Signed   By: Rolm Baptise M.D.   On: 02/11/2017 10:45   Ct Head Wo Contrast  Result Date: 02/11/2017 CLINICAL DATA:  Weakness, difficulty walking. EXAM: CT HEAD WITHOUT CONTRAST TECHNIQUE: Contiguous axial images were obtained from the base of the skull through the vertex without intravenous contrast. COMPARISON:  None. FINDINGS: Brain: No acute intracranial abnormality. Specifically, no hemorrhage, hydrocephalus, mass lesion, acute infarction, or significant intracranial injury. Vascular: No hyperdense vessel or unexpected calcification. Skull: No acute calvarial abnormality. Sinuses/Orbits: Mucosal thickening in the left maxillary sinus. No air-fluid levels. Mastoid air cells are clear. Orbital soft tissues unremarkable. Other: None IMPRESSION: No acute intracranial abnormality. Chronic left maxillary sinusitis. Electronically Signed   By: Rolm Baptise M.D.   On: 02/11/2017 10:23   Ct Chest Wo Contrast  Result Date: 02/11/2017 CLINICAL DATA:  Abdominal distention.  Colon cancer. EXAM: CT CHEST, ABDOMEN AND PELVIS WITHOUT CONTRAST TECHNIQUE: Multidetector CT imaging of the chest, abdomen and pelvis was performed following the standard protocol without IV contrast. COMPARISON:  CT 01/22/2017.  Chest CT 11/08/2015 FINDINGS: CT CHEST FINDINGS Cardiovascular: Pacer wires noted in the right heart. Severe diffuse coronary artery calcifications. Scattered aortic calcifications. No aneurysm. Heart is normal size. Mediastinum/Nodes: Innumerable bilateral small pulmonary nodules compatible with diffuse metastatic involvement of  the lungs. Small right pleural effusion. Lungs/Pleura: No mediastinal, hilar, or axillary adenopathy. Musculoskeletal: Chest wall soft tissues are unremarkable. Degenerative changes in the thoracic spine. Diffuse sclerotic appearance of the bony structures without focal lytic lesion or destructive process. CT ABDOMEN PELVIS FINDINGS Hepatobiliary: Innumerable large masses throughout the liver, difficult to measure due to the noncontrast nature of the study. Findings are likely similar to recent abdominal CT. Gallbladder unremarkable. Pancreas: No focal abnormality or ductal dilatation. Spleen: Small low-density lesion in the posterior spleen on image 54 measures 13 mm. Cannot exclude metastasis. Adrenals/Urinary Tract: Small low-density lesions in the kidneys bilaterally, stable, likely small cysts. Punctate nonobstructing stone  in the lower pole of the left kidney. Adrenal glands and urinary bladder unremarkable. Stomach/Bowel: Postoperative changes in the distal transverse colon near the splenic flexure. Colonic diverticulosis. No visible active diverticulitis. Stomach and small bowel decompressed, unremarkable. Vascular/Lymphatic: Aortic and iliac calcifications. No aneurysm or adenopathy. Reproductive: Prostate enlargement. Other: No free air. Free fluid adjacent to the liver and extending in the right paracolic gutter and right pelvis. Musculoskeletal: No focal lytic or destructive bone lesion. Diffuse degenerative changes in the lumbar spine. Diffuse sclerotic appearance of the bones. IMPRESSION: Innumerable/diffuse nodules throughout the lungs compatible with metastatic disease. Small right pleural effusion. Extensive coronary artery disease. Innumerable large hepatic metastases, similar to prior study. Small hypodensity in the spleen, cannot exclude metastasis. Left lower pole nephrolithiasis. Free fluid adjacent to the liver and in the right abdomen/ pelvis. Diffuse sclerotic appearance of the bony  structures without visible destructive process or lytic lesion. Cannot completely exclude diffuse metastatic involvement. Consider further evaluation with bone scan. Electronically Signed   By: Rolm Baptise M.D.   On: 02/11/2017 11:40    EKG: Independently reviewed.  Paced rhythm  Assessment/Plan:   Principal Problem:   SCC (spinal cord compression) (HCC) Active Problems:   Chronic combined systolic and diastolic CHF, NYHA class 1 (HCC)   Paroxysmal atrial fibrillation (HCC)   Dehydration   ARF (acute renal failure) (HCC)   SCC Concern for spinal cord compression given increasing lower back pain with LE weakness in patient with stage 4 ca. Of note, lower extremity weakness improved with Decadron per patient report. Unable to do MRI or CT given acute renal failure with creatinine of 3 and known pacemaker. I have a phone call into Dr. Irene Limbo, patient's oncologist. Discussed with radiation oncology, Dr. Isidore Moos who will consult on patient and follow with [place].  Continue Decadron and pain management for now.  HYPOTENSION Improved with hydration. Hold Lasix for now.  ARF 1.7 last check, now 3.22 most likely secondary to intravascular volume depletion. No hydroureter or evidence of ureteral obstruction on CT.  HYPONATREMIA Chronic, slightly worsened now, asymptomatic Will treat with hydration with normal saline.  HTN Improved and somewhat normalized with hydration. We will need to continue carvedilol for rate control of atrial fibrillation. We'll continue doxazosin for treatment of BPH. Hold Lasix  ELEVATED TROPONIN Likely secondary to renal failure. We'll trend and follow.  COLON CANCER STAGE IV Goals for care discussion initiated with patient and his daughter. They will continue this discussion with Dr. Irene Limbo and request a family meeting. For now patient is full code. He is aware that this is a terminal condition but that there are palliative options potentially  available.  AFIB xarelto was discontinued 3 weeks ago per patient We'll continue carvedilol for rate control.  DIASTOLIC CHF We will need to follow lung exam closely as we are hydrating patient, and holding his Lasix. Lungs at present are clear and patient appears to be compensated.     Other information:   DVT prophylaxis: Lovenox ordered. Code Status: Full code. Family Communication: Patient's son and daughter have been at bedside with patient throughout his stay. Disposition Plan:  To be determined Consults called:  Radiation oncology Dr. Isidore Moos and medical oncology Dr. Irene Limbo Admission status:  Inpatient   The medical decision making on this patient was of high complexity and the patient is at high risk for clinical deterioration, therefore this is a level 3 visit.    Dewaine Oats Derek Jack [facility] Pager [phone] Cell: [phone]  If 7PM-7AM, please contact night-coverage [email or web address] Password [facility] 02/11/2017, 6:43 PM

## 2017-02-11 NOTE — ED Notes (Signed)
PT aware of urine sample urinal in hand 

## 2017-02-11 NOTE — Telephone Encounter (Signed)
Communicated pt status with physician.

## 2017-02-11 NOTE — ED Notes (Signed)
rn at bedside collect labs

## 2017-02-11 NOTE — Consult Note (Addendum)
Radiation Oncology         [phone] ________________________________  Name: Ernest Lara        MRN: [id number]  Date of Service: 02/11/2017 DOB: [birth year]  CC:Jonathon Jordan, MD  No ref. provider found     REFERRING PHYSICIAN: No ref. provider found   DIAGNOSIS: The encounter diagnosis was Malignant neoplasm of colon, unspecified part of colon ([place]).   HISTORY OF PRESENT ILLNESS: Ernest Lara is a 76 y.o. male seen at the request of Dr.  Jamse Arn for possible clinical cord compression in the setting of recurrent metastatic colon cancer. The patient was diagnosed with T3N1c adenocarcinoma of the left colon and underwent left colectomy last summer. He was on two courses of chemotherapy, 6 cycles of Folfox, followed by Leucovorin and 5FU which he completed this spring. He was radiographically in remission this spring, and his PCP recommended he be re-evaluated with CT imaging. A CT of the abdomen and pelvis on 01/22/17 revealed recurrent disease in the liver and lungs. He was planning to start palliative chemotherapy next week, but was admitted after noting 3 weeks of increasing falls, weakness, and loss of balance. He's also developed more discomfort in his low back as well. He has had DM type II for >20 years and recently made changes to his medical management of this as his blood sugars improved. He also has a history of neuropathy from diabetes and  prior sports injuries. A CT scan of the chest, abdomen, and pelvis at the ED today revealed diffuse sclerotic changes of the skeletal system without focal findings. His symptoms however were concerning, and his exam of 4/5 strength in the ED prompted him to receive Dexamethasone 40m in the ED. He believes that since IV hydration and dexamethasone he's feeling better. His creatinine was 3.2, and he has a pacemaker that is not compatible with MRI, and we've been asked to consult to determine further work up of possible cord  compression.    PREVIOUS RADIATION THERAPY: No   PAST MEDICAL HISTORY:  Past Medical History:  Diagnosis Date  . CKD (chronic kidney disease) stage 3, GFR 30-59 ml/min    kidney function concerns with chemo  . Complete heart block ([facility]    s. 05/02/2015 s/p BSX U128 Valitude CRT-P (Beckie Salts.  .Marland KitchenGIB (gastrointestinal bleeding)    a. 10/2015- Hgb 4.6-->8u PRBC's;  b. 10/2015 EGD: non bleeding H pylori + ulceration; c. 10/2015 Colonoscopy: invasive adenocarcinoma.  . History of cardiomyopathy    a. Nonischemic, possibly tachycardia mediated;  b. 04/2015 Echo:  EF 60-65%, no rwma, mild AI/MR, mod dil LA/RA, PASP 32mg.  . Marland Kitchenypertension   . Hypertensive heart disease   . Paroxysmal atrial fibrillation (HCC)    a. CHA2DS2VASc = 2-3 (Xarelto).  . Stage IIIB Colon Cancer    a. 11/2015 Colonscopy: invasive adenocarcinoma;  b. 11/2015 s/p lap L colectomy; c. Pathology: pT3, pN1c Mx, MSI stable.  . Type 2 diabetes mellitus ([place] [place]       PAST SURGICAL HISTORY: Past Surgical History:  Procedure Laterality Date  . CARDIAC CATHETERIZATION    . CARDIOVERSION  10/11/2011   Procedure: CARDIOVERSION;  Surgeon: DaLeonie ManMD;  [facility]: [facility];  Laterality: N/A;  . COLONOSCOPY N/A 11/05/2015   Procedure: COLONOSCOPY;  Surgeon: RoRonald LoboMD;  [facility];  Service: Endoscopy;  Laterality: N/A;  . EP IMPLANTABLE DEVICE N/A 05/02/2015   Procedure: BiV Pacemaker Insertion CRT-P;  Surgeon: GrChamp Mungo  Lovena Le, MD;  Location: [facility] LAB;  Service: Cardiovascular;  Laterality: N/A;  . ESOPHAGOGASTRODUODENOSCOPY N/A 11/05/2015   Procedure: ESOPHAGOGASTRODUODENOSCOPY (EGD);  Surgeon: Ronald Lobo, MD;  Location: [facility] ENDOSCOPY;  Service: Endoscopy;  Laterality: N/A;  . EYE SURGERY  2015   left cataract with lens implant  . IR FLUORO GUIDE PORT INSERTION RIGHT  02/03/2017  . IR US GUIDE VASC ACCESS RIGHT  02/03/2017  . LAPAROSCOPIC PARTIAL COLECTOMY N/A 11/10/2015    Procedure: LAPAROSCOPIC LEFT COLECTOMY LAPAROSCOPIC MOBILIZATION OF SPLENIC FLEXURE;  Surgeon: Greer Pickerel, MD;  Location: [facility];  Service: General;  Laterality: N/A;  . Nuclear stress  11/01/2011   Severe global hypokinesis,dilated ventricle  . PORTACATH PLACEMENT N/A 12/29/2015   Procedure: INSERTION PORT-A-CATH WITH ULTRASOUND GUIDANCE;  Surgeon: Greer Pickerel, MD;  Location: [facility];  Service: General;  Laterality: N/A;  . TEE WITHOUT CARDIOVERSION  10/10/2011   Procedure: TRANSESOPHAGEAL ECHOCARDIOGRAM (TEE);  Surgeon: Sanda Klein, MD;  Location: [facility] ENDOSCOPY;  Service: Cardiovascular;  Laterality: N/A;     FAMILY HISTORY:  Family History  Problem Relation Age of Onset  . Colon cancer Mother   . Kidney disease Father        ESRF/HD, deceased     SOCIAL HISTORY:  reports that he has never smoked. He has never used smokeless tobacco. He reports that he does not drink alcohol or use drugs. The patient is retired, he is married and lives in [place]. He played football years ago in his teens.   ALLERGIES: Amiodarone and Allopurinol   MEDICATIONS:  Current Facility-Administered Medications  Medication Dose Route Frequency Provider Last Rate Last Dose  . 0.9 %  sodium chloride infusion   Intravenous Continuous Vashti Hey, MD 125 mL/hr at 02/11/17 1600    . acetaminophen (TYLENOL) tablet 1,000 mg  1,000 mg Oral Q6H PRN Bonnell Public Tublu, MD      . bisacodyl (DULCOLAX) suppository 10 mg  10 mg Rectal Daily PRN Bonnell Public Tublu, MD      . carvedilol (COREG) tablet 12.5 mg  12.5 mg Oral BID WC Bonnell Public Tublu, MD   12.5 mg at 02/11/17 1643  . dexamethasone (DECADRON) injection 4 mg  4 mg Intravenous Q6H Bonnell Public Tublu, MD   4 mg at 02/11/17 1644  . doxazosin (CARDURA) tablet 8 mg  8 mg Oral QHS Bonnell Public Tublu, MD      . morphine (MSIR) tablet 15 mg  15 mg Oral Q4H PRN Bonnell Public Tublu, MD      . polyethylene  glycol (MIRALAX / GLYCOLAX) packet 17 g  17 g Oral Daily PRN Bonnell Public Tublu, MD      . rosuvastatin (CRESTOR) tablet 10 mg  10 mg Oral QPM Bonnell Public Tublu, MD   10 mg at 02/11/17 1643  . sodium phosphate (FLEET) 7-19 GM/118ML enema 1 enema  1 enema Rectal Once PRN Jamse Arn Kyra Searles, MD         REVIEW OF SYSTEMS: On review of systems, the patient reports that he is doing well overall since IVF and dexamethasone. He denies any chest pain, shortness of breath, cough, fevers, chills, night sweats. He has been losing several pounds due to lack of appetite. He denies any bowel or bladder disturbances, and denies abdominal pain, nausea or vomiting. He reports a history of some low back pain for years, and though this increased in intensity, he denies any new musculoskeletal or joint aches or pains. A complete review of  systems is obtained and is otherwise negative.     PHYSICAL EXAM:  Wt Readings from Last 3 Encounters:  02/11/17 222 lb (100.7 kg)  01/27/17 222 lb 3.2 oz (100.8 kg)  09/11/16 246 lb 12.8 oz (111.9 kg)   Temp Readings from Last 3 Encounters:  02/11/17 97.6 F (36.4 C) (Oral)  02/03/17 98 F (36.7 C) (Oral)  01/27/17 97.7 F (36.5 C) (Oral)   BP Readings from Last 3 Encounters:  02/11/17 135/70  02/03/17 (!) 133/58  01/27/17 122/60   Pulse Readings from Last 3 Encounters:  02/11/17 63  02/03/17 67  01/27/17 76   Pain Assessment Pain Score: 0-No pain/10  In general this is a well appearing African American male in no acute distress. He is alert and oriented x4 and appropriate throughout the examination. HEENT reveals that the patient is normocephalic, atraumatic. EOMs are intact. PERRLA. Skin is intact without any evidence of gross lesions. Cardiovascular exam reveals a regular rate and rhythm, no clicks rubs or murmurs are auscultated. Chest is clear to auscultation bilaterally. Lymphatic assessment is performed and does not reveal any  adenopathy in the cervical, supraclavicular, axillary, or inguinal chains. Abdomen has active bowel sounds in all quadrants and is intact. The abdomen is soft, non tender, non distended. Lower extremities are negative for pretibial pitting edema, deep calf tenderness, cyanosis or clubbing. The patient has intact sensation to light touch over the face, upper and lower extremities and this is equilateral. He has equal grip strength that is 5/5, and bilateral upper and lower extremities also exhibit 5/5 strength. No focal neurologic changes are noted.  ECOG = 2  0 - Asymptomatic (Fully active, able to carry on all predisease activities without restriction)  1 - Symptomatic but completely ambulatory (Restricted in physically strenuous activity but ambulatory and able to carry out work of a light or sedentary nature. For example, light housework, office work)  2 - Symptomatic, <50% in bed during the day (Ambulatory and capable of all self care but unable to carry out any work activities. Up and about more than 50% of waking hours)  3 - Symptomatic, >50% in bed, but not bedbound (Capable of only limited self-care, confined to bed or chair 50% or more of waking hours)  4 - Bedbound (Completely disabled. Cannot carry on any self-care. Totally confined to bed or chair)  5 - Death   Eustace Pen MM, Creech RH, Tormey DC, et al. [phone]). "Toxicity and response criteria of the [facility]". [place] Oncol. 5 (6): 649-55    LABORATORY DATA:  Lab Results  Component Value Date   WBC 13.8 (H) 02/11/2017   HGB 9.5 (L) 02/11/2017   HCT 28.1 (L) 02/11/2017   MCV 67.1 (L) 02/11/2017   PLT 148 (L) 02/11/2017   Lab Results  Component Value Date   NA 133 (L) 02/11/2017   K 4.0 02/11/2017   CL 104 02/11/2017   CO2 18 (L) 02/11/2017   Lab Results  Component Value Date   ALT 73 (H) 02/11/2017   AST 230 (H) 02/11/2017   ALKPHOS 446 (H) 02/11/2017   BILITOT 2.2 (H) 02/11/2017       RADIOGRAPHY: Ct Abdomen Pelvis Wo Contrast  Result Date: 02/11/2017 CLINICAL DATA:  Abdominal distention.  Colon cancer. EXAM: CT CHEST, ABDOMEN AND PELVIS WITHOUT CONTRAST TECHNIQUE: Multidetector CT imaging of the chest, abdomen and pelvis was performed following the standard protocol without IV contrast. COMPARISON:  CT 01/22/2017.  Chest CT 11/08/2015  FINDINGS: CT CHEST FINDINGS Cardiovascular: Pacer wires noted in the right heart. Severe diffuse coronary artery calcifications. Scattered aortic calcifications. No aneurysm. Heart is normal size. Mediastinum/Nodes: Innumerable bilateral small pulmonary nodules compatible with diffuse metastatic involvement of the lungs. Small right pleural effusion. Lungs/Pleura: No mediastinal, hilar, or axillary adenopathy. Musculoskeletal: Chest wall soft tissues are unremarkable. Degenerative changes in the thoracic spine. Diffuse sclerotic appearance of the bony structures without focal lytic lesion or destructive process. CT ABDOMEN PELVIS FINDINGS Hepatobiliary: Innumerable large masses throughout the liver, difficult to measure due to the noncontrast nature of the study. Findings are likely similar to recent abdominal CT. Gallbladder unremarkable. Pancreas: No focal abnormality or ductal dilatation. Spleen: Small low-density lesion in the posterior spleen on image 54 measures 13 mm. Cannot exclude metastasis. Adrenals/Urinary Tract: Small low-density lesions in the kidneys bilaterally, stable, likely small cysts. Punctate nonobstructing stone in the lower pole of the left kidney. Adrenal glands and urinary bladder unremarkable. Stomach/Bowel: Postoperative changes in the distal transverse colon near the splenic flexure. Colonic diverticulosis. No visible active diverticulitis. Stomach and small bowel decompressed, unremarkable. Vascular/Lymphatic: Aortic and iliac calcifications. No aneurysm or adenopathy. Reproductive: Prostate enlargement. Other: No free air. Free  fluid adjacent to the liver and extending in the right paracolic gutter and right pelvis. Musculoskeletal: No focal lytic or destructive bone lesion. Diffuse degenerative changes in the lumbar spine. Diffuse sclerotic appearance of the bones. IMPRESSION: Innumerable/diffuse nodules throughout the lungs compatible with metastatic disease. Small right pleural effusion. Extensive coronary artery disease. Innumerable large hepatic metastases, similar to prior study. Small hypodensity in the spleen, cannot exclude metastasis. Left lower pole nephrolithiasis. Free fluid adjacent to the liver and in the right abdomen/ pelvis. Diffuse sclerotic appearance of the bony structures without visible destructive process or lytic lesion. Cannot completely exclude diffuse metastatic involvement. Consider further evaluation with bone scan. Electronically Signed   By: Rolm Baptise M.D.   On: 02/11/2017 11:40   Ct Abdomen Pelvis Wo Contrast  Result Date: 01/22/2017 CLINICAL DATA:  Status post partial colectomy 11/09/2016 for distal transverse colonic adenocarcinoma. Status post chemotherapy completed January 2018. Restaging. EXAM: CT ABDOMEN AND PELVIS WITHOUT CONTRAST TECHNIQUE: Multidetector CT imaging of the abdomen and pelvis was performed following the standard protocol without IV contrast. COMPARISON:  11/08/2015 CT abdomen/ pelvis. FINDINGS: Lower chest: There are numerous (greater than 20) solid pulmonary nodules randomly distributed throughout both lung bases, largest 6 mm in the medial right lower lobe (series 4/image 8) and 6 mm in the anterior left lower lobe (series 4/ image 2), all new since 11/08/2015. Patchy punctate calcifications throughout the right lung base are stable and compatible with remote postinflammatory change. Coronary atherosclerosis. Pacer leads are seen in the right atrium, right ventricle and coronary sinus. Hepatobiliary: Liver is newly enlarged by numerous (greater than 10) new hypodense  confluent liver masses scattered throughout the liver. Representative liver masses include a 7.9 x 7.4 cm superior right liver lobe mass (series 3/ image 15) and a 7.7 x 5.3 cm posterior left liver lobe mass (series 3/image 18). Normal gallbladder with no radiopaque cholelithiasis. No biliary ductal dilatation. Pancreas: Normal, with no mass or duct dilation. Spleen: Normal size. No mass. Adrenals/Urinary Tract: Stable appearance of the adrenal glands without discrete adrenal nodules. No hydronephrosis. Nonobstructing 3 mm lower left renal stone. No additional renal stones. Simple 1.7 cm lateral interpolar left renal cyst. Otherwise no contour deforming renal lesions. Normal bladder. Stomach/Bowel: Grossly normal stomach. Normal caliber small bowel with no small bowel  wall thickening. Normal appendix. Interval partial transverse colectomy. No discrete mass or wall thickening at the left upper quadrant colonic anastomosis. Mild diverticulosis in the left colon, with no large bowel wall thickening or pericolonic fat stranding. Oral contrast reaches the rectum. Vascular/Lymphatic: Atherosclerotic nonaneurysmal abdominal aorta. No pathologically enlarged lymph nodes in the abdomen or pelvis. Reproductive: Stable moderate prostatomegaly. Other: No pneumoperitoneum, ascites or focal fluid collection. Musculoskeletal: No aggressive appearing focal osseous lesions. Marked thoracolumbar spondylosis. IMPRESSION: 1. Numerous new hypodense liver masses scattered throughout the liver, most compatible with bulky hepatic metastatic disease. 2. Numerous new solid pulmonary nodules throughout both lung bases, most compatible with pulmonary metastases. 3. No evidence of local tumor recurrence at the colonic anastomosis. No abdominopelvic adenopathy. 4. Additional findings include Aortic Atherosclerosis (ICD10-I70.0), moderate prostatomegaly, mild left colonic diverticulosis and nonobstructing left renal stone. These results will be  called to the ordering clinician or representative by the Radiologist Assistant, and communication documented in the PACS or zVision Dashboard. Electronically Signed   By: Ilona Sorrel M.D.   On: 01/22/2017 14:09   Dg Chest 2 View  Result Date: 02/11/2017 CLINICAL DATA:  Weakness, shortness of Breath EXAM: CHEST  2 VIEW COMPARISON:  12/29/2015 FINDINGS: Right Port-A-Cath and left pacer remain in place, unchanged. Patchy bilateral airspace opacities are noted, new since prior study. Heart is upper limits normal in size. No effusions or acute bony abnormality. IMPRESSION: New patchy bilateral airspace disease which could reflect edema or infection. Electronically Signed   By: Rolm Baptise M.D.   On: 02/11/2017 10:45   Ct Head Wo Contrast  Result Date: 02/11/2017 CLINICAL DATA:  Weakness, difficulty walking. EXAM: CT HEAD WITHOUT CONTRAST TECHNIQUE: Contiguous axial images were obtained from the base of the skull through the vertex without intravenous contrast. COMPARISON:  None. FINDINGS: Brain: No acute intracranial abnormality. Specifically, no hemorrhage, hydrocephalus, mass lesion, acute infarction, or significant intracranial injury. Vascular: No hyperdense vessel or unexpected calcification. Skull: No acute calvarial abnormality. Sinuses/Orbits: Mucosal thickening in the left maxillary sinus. No air-fluid levels. Mastoid air cells are clear. Orbital soft tissues unremarkable. Other: None IMPRESSION: No acute intracranial abnormality. Chronic left maxillary sinusitis. Electronically Signed   By: Rolm Baptise M.D.   On: 02/11/2017 10:23   Ct Chest Wo Contrast  Result Date: 02/11/2017 CLINICAL DATA:  Abdominal distention.  Colon cancer. EXAM: CT CHEST, ABDOMEN AND PELVIS WITHOUT CONTRAST TECHNIQUE: Multidetector CT imaging of the chest, abdomen and pelvis was performed following the standard protocol without IV contrast. COMPARISON:  CT 01/22/2017.  Chest CT 11/08/2015 FINDINGS: CT CHEST FINDINGS  Cardiovascular: Pacer wires noted in the right heart. Severe diffuse coronary artery calcifications. Scattered aortic calcifications. No aneurysm. Heart is normal size. Mediastinum/Nodes: Innumerable bilateral small pulmonary nodules compatible with diffuse metastatic involvement of the lungs. Small right pleural effusion. Lungs/Pleura: No mediastinal, hilar, or axillary adenopathy. Musculoskeletal: Chest wall soft tissues are unremarkable. Degenerative changes in the thoracic spine. Diffuse sclerotic appearance of the bony structures without focal lytic lesion or destructive process. CT ABDOMEN PELVIS FINDINGS Hepatobiliary: Innumerable large masses throughout the liver, difficult to measure due to the noncontrast nature of the study. Findings are likely similar to recent abdominal CT. Gallbladder unremarkable. Pancreas: No focal abnormality or ductal dilatation. Spleen: Small low-density lesion in the posterior spleen on image 54 measures 13 mm. Cannot exclude metastasis. Adrenals/Urinary Tract: Small low-density lesions in the kidneys bilaterally, stable, likely small cysts. Punctate nonobstructing stone in the lower pole of the left kidney. Adrenal glands and urinary  bladder unremarkable. Stomach/Bowel: Postoperative changes in the distal transverse colon near the splenic flexure. Colonic diverticulosis. No visible active diverticulitis. Stomach and small bowel decompressed, unremarkable. Vascular/Lymphatic: Aortic and iliac calcifications. No aneurysm or adenopathy. Reproductive: Prostate enlargement. Other: No free air. Free fluid adjacent to the liver and extending in the right paracolic gutter and right pelvis. Musculoskeletal: No focal lytic or destructive bone lesion. Diffuse degenerative changes in the lumbar spine. Diffuse sclerotic appearance of the bones. IMPRESSION: Innumerable/diffuse nodules throughout the lungs compatible with metastatic disease. Small right pleural effusion. Extensive coronary  artery disease. Innumerable large hepatic metastases, similar to prior study. Small hypodensity in the spleen, cannot exclude metastasis. Left lower pole nephrolithiasis. Free fluid adjacent to the liver and in the right abdomen/ pelvis. Diffuse sclerotic appearance of the bony structures without visible destructive process or lytic lesion. Cannot completely exclude diffuse metastatic involvement. Consider further evaluation with bone scan. Electronically Signed   By: Rolm Baptise M.D.   On: 02/11/2017 11:40   Ir US Guide Vasc Access Right  Result Date: 02/03/2017 INDICATION: History of metastatic colon cancer. In need of durable intravenous access for chemotherapy administration. EXAM: IMPLANTED PORT A CATH PLACEMENT WITH ULTRASOUND AND FLUOROSCOPIC GUIDANCE COMPARISON:  Chest CT - 11/08/2015 MEDICATIONS: Ancef 2 gm IV; The antibiotic was administered within an appropriate time interval prior to skin puncture. ANESTHESIA/SEDATION: Moderate (conscious) sedation was employed during this procedure. A total of Versed 2 mg and Fentanyl 100 mcg was administered intravenously. Moderate Sedation Time: 25 minutes. The patient's level of consciousness and vital signs were monitored continuously by radiology nursing throughout the procedure under my direct supervision. CONTRAST:  None FLUOROSCOPY TIME:  18 seconds (7 mGy) COMPLICATIONS: None immediate. PROCEDURE: The procedure, risks, benefits, and alternatives were explained to the patient. Questions regarding the procedure were encouraged and answered. The patient understands and consents to the procedure. The right neck and chest were prepped with chlorhexidine in a sterile fashion, and a sterile drape was applied covering the operative field. Maximum barrier sterile technique with sterile gowns and gloves were used for the procedure. A timeout was performed prior to the initiation of the procedure. Local anesthesia was provided with 1% lidocaine with epinephrine.  After creating a small venotomy incision, a micropuncture kit was utilized to access the internal jugular vein. Real-time ultrasound guidance was utilized for vascular access including the acquisition of a permanent ultrasound image documenting patency of the accessed vessel. The microwire was utilized to measure appropriate catheter length. A subcutaneous port pocket was then created along the upper chest wall utilizing a combination of sharp and blunt dissection. The pocket was irrigated with sterile saline. A single lumen ISP power injectable port was chosen for placement. The 8 Fr catheter was tunneled from the port pocket site to the venotomy incision. The port was placed in the pocket. The external catheter was trimmed to appropriate length. At the venotomy, an 8 Fr peel-away sheath was placed over a guidewire under fluoroscopic guidance. The catheter was then placed through the sheath and the sheath was removed. Final catheter positioning was confirmed and documented with a fluoroscopic spot radiograph. The port was accessed with a Huber needle, aspirated and flushed with heparinized saline. The venotomy site was closed with an interrupted 4-0 Vicryl suture. The port pocket incision was closed with interrupted 2-0 Vicryl suture and the skin was opposed with a running subcuticular 4-0 Vicryl suture. Dermabond and Steri-strips were applied to both incisions. Dressings were placed. The patient tolerated the procedure  well without immediate post procedural complication. FINDINGS: After catheter placement, the tip lies within the superior cavoatrial junction. The catheter aspirates and flushes normally and is ready for immediate use. IMPRESSION: Successful placement of a right internal jugular approach power injectable Port-A-Cath. The catheter is ready for immediate use. Electronically Signed   By: Sandi Mariscal M.D.   On: 02/03/2017 13:16   Ir Fluoro Guide Port Insertion Right  Result Date:  02/03/2017 INDICATION: History of metastatic colon cancer. In need of durable intravenous access for chemotherapy administration. EXAM: IMPLANTED PORT A CATH PLACEMENT WITH ULTRASOUND AND FLUOROSCOPIC GUIDANCE COMPARISON:  Chest CT - 11/08/2015 MEDICATIONS: Ancef 2 gm IV; The antibiotic was administered within an appropriate time interval prior to skin puncture. ANESTHESIA/SEDATION: Moderate (conscious) sedation was employed during this procedure. A total of Versed 2 mg and Fentanyl 100 mcg was administered intravenously. Moderate Sedation Time: 25 minutes. The patient's level of consciousness and vital signs were monitored continuously by radiology nursing throughout the procedure under my direct supervision. CONTRAST:  None FLUOROSCOPY TIME:  18 seconds (7 mGy) COMPLICATIONS: None immediate. PROCEDURE: The procedure, risks, benefits, and alternatives were explained to the patient. Questions regarding the procedure were encouraged and answered. The patient understands and consents to the procedure. The right neck and chest were prepped with chlorhexidine in a sterile fashion, and a sterile drape was applied covering the operative field. Maximum barrier sterile technique with sterile gowns and gloves were used for the procedure. A timeout was performed prior to the initiation of the procedure. Local anesthesia was provided with 1% lidocaine with epinephrine. After creating a small venotomy incision, a micropuncture kit was utilized to access the internal jugular vein. Real-time ultrasound guidance was utilized for vascular access including the acquisition of a permanent ultrasound image documenting patency of the accessed vessel. The microwire was utilized to measure appropriate catheter length. A subcutaneous port pocket was then created along the upper chest wall utilizing a combination of sharp and blunt dissection. The pocket was irrigated with sterile saline. A single lumen ISP power injectable port was chosen  for placement. The 8 Fr catheter was tunneled from the port pocket site to the venotomy incision. The port was placed in the pocket. The external catheter was trimmed to appropriate length. At the venotomy, an 8 Fr peel-away sheath was placed over a guidewire under fluoroscopic guidance. The catheter was then placed through the sheath and the sheath was removed. Final catheter positioning was confirmed and documented with a fluoroscopic spot radiograph. The port was accessed with a Huber needle, aspirated and flushed with heparinized saline. The venotomy site was closed with an interrupted 4-0 Vicryl suture. The port pocket incision was closed with interrupted 2-0 Vicryl suture and the skin was opposed with a running subcuticular 4-0 Vicryl suture. Dermabond and Steri-strips were applied to both incisions. Dressings were placed. The patient tolerated the procedure well without immediate post procedural complication. FINDINGS: After catheter placement, the tip lies within the superior cavoatrial junction. The catheter aspirates and flushes normally and is ready for immediate use. IMPRESSION: Successful placement of a right internal jugular approach power injectable Port-A-Cath. The catheter is ready for immediate use. Electronically Signed   By: Sandi Mariscal M.D.   On: 02/03/2017 13:16       IMPRESSION/PLAN: 1. 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 I spent time with the patient and his daughter discussing the findings, the limitations  of his creatinine and pacemaker on his work up, and the possible options that radiotherapy may have. We discussed options for a bone scan and an order has been placed to further identify if there is spinal disease. We will follow up with this when available, and move forward with discussion based on these results. I will plan to call the patient's daughter initially so  we can make plans if there is a role for radiotherapy. In the meantime he can continue dexamethasone with frequent neuro checks per nursing at bedside.  In a visit lasting 70 minutes, greater than 50% of the time was spent face to face discussing his history current exam and clinica concerns, and coordinating the patient's care.     Carola Rhine, PAC

## 2017-02-11 NOTE — ED Provider Notes (Signed)
[facility] DEPT Provider Note   CSN: [id number] Arrival date & time: 02/11/17  0849     History   Chief Complaint Chief Complaint  Patient presents with  . Weakness  . cancer patient  . Abdominal Pain    HPI Ernest Lara is a 76 y.o. male.  The history is provided by the patient and a relative. No language interpreter was used.  Weakness   Abdominal Pain     Ernest Lara is a 76 y.o. male who presents to the Emergency Department complaining of weakness.  He has a history of colon cancer and 3 weeks ago learned that he had metastatic disease. Over the last 2-3 weeks he has had progressive weakness with abdominal discomfort and low back pain. He is not able to eat because he has poor appetite but he is drinking fluids and urinating well. He endorses shortness of breath and unsteady gait. No fevers, vomiting, diarrhea, black or bloody stools. He currently lives alone. He is currently unable to ambulate without assistance.  Past Medical History:  Diagnosis Date  . CKD (chronic kidney disease) stage 3, GFR 30-59 ml/min    kidney function concerns with chemo  . Complete heart block ([place])    s. 05/02/2015 s/p BSX U128 Valitude CRT-P Beckie Salts).  Marland Kitchen GIB (gastrointestinal bleeding)    a. 10/2015- Hgb 4.6-->8u PRBC's;  b. 10/2015 EGD: non bleeding H pylori + ulceration; c. 10/2015 Colonoscopy: invasive adenocarcinoma.  . History of cardiomyopathy    a. Nonischemic, possibly tachycardia mediated;  b. 04/2015 Echo:  EF 60-65%, no rwma, mild AI/MR, mod dil LA/RA, PASP 30mHg.  .Marland KitchenHypertension   . Hypertensive heart disease   . Paroxysmal atrial fibrillation (HCC)    a. CHA2DS2VASc = 2-3 (Xarelto).  . Stage IIIB Colon Cancer    a. 11/2015 Colonscopy: invasive adenocarcinoma;  b. 11/2015 s/p lap L colectomy; c. Pathology: pT3, pN1c Mx, MSI stable.  . Type 2 diabetes mellitus ([facility]     Patient Active Problem List   Diagnosis Date Noted  . Dehydration 04/09/2016  . Biventricular  cardiac pacemaker in situ 03/11/2016  . Chemotherapy induced neutropenia ([place] 02/07/2016  . Long term current use of anticoagulant therapy 02/07/2016  . Renal insufficiency 02/07/2016  . Port catheter in place 01/24/2016  . B12 deficiency 12/06/2015  . Stage IIIB Colon Cancer   . GIB (gastrointestinal bleeding)   . History of cardiomyopathy   . Paroxysmal atrial fibrillation (HCC)   . Absolute anemia   . Chronic diastolic CHF (congestive heart failure) ([place]   . Uncontrolled type 2 diabetes mellitus with complication (HChest[place]   . Chronic diastolic heart failure ([place] 11/10/2015  . Iron deficiency anemia   . Chronic blood loss anemia   . Anemia 11/03/2015  . Complete heart block ([place] 05/01/2015  . Congestive dilated cardiomyopathy (HH[place] 12/24/2012  . Chronic combined systolic and diastolic CHF, NYHA class 1 (HIrwin 12/24/2012  . Hyperlipidemia 12/24/2012  . LBBB (left bundle branch block) 12/24/2012  . CKD (chronic kidney disease) stage 3, GFR 30-59 ml/min 10/13/2011  . PAF (paroxysmal atrial fibrillation), recent DCCV 10/11/11 to SR. 10/13/2011  . Amiodarone pulmonary toxicity, and associated pulmonary edema 10/12/2011  . OSA (obstructive sleep apnea), pt with apnea during procedures 10/11/2011  . Shortness of breath, acute, awakened from sleep, 10/12/11, secondary to pulmonary toxicity to amiodarone 10/08/2011  . Atrial fibrillation with RVR, new onset, 10/08/11, DCCV to SR on 10/11/11 10/08/2011  . DM (diabetes mellitus), type 2  10/08/2011  . HTN (hypertension) 10/08/2011  . Acute systolic CHF (congestive heart failure), new onset, could be related to tachycardia 10/08/2011  . Cardiomyopathy, poss. related to tachycardia , new, on echo 10/08/11 EF 20-25% 10/08/2011    Past Surgical History:  Procedure Laterality Date  . CARDIAC CATHETERIZATION    . CARDIOVERSION  10/11/2011   Procedure: CARDIOVERSION;  Surgeon: Leonie Man, MD;  Location: [place];  Service: Cardiovascular;  Laterality:  N/A;  . COLONOSCOPY N/A 11/05/2015   Procedure: COLONOSCOPY;  Surgeon: Ronald Lobo, MD;  [facility];  Service: Endoscopy;  Laterality: N/A;  . EP IMPLANTABLE DEVICE N/A 05/02/2015   Procedure: BiV Pacemaker Insertion CRT-P;  Surgeon: Evans Lance, MD;  Location: [place] CV LAB;  Service: Cardiovascular;  Laterality: N/A;  . ESOPHAGOGASTRODUODENOSCOPY N/A 11/05/2015   Procedure: ESOPHAGOGASTRODUODENOSCOPY (EGD);  Surgeon: Ronald Lobo, MD;  Location: [facility] ENDOSCOPY;  Service: Endoscopy;  Laterality: N/A;  . EYE SURGERY  2015   left cataract with lens implant  . IR FLUORO GUIDE PORT INSERTION RIGHT  02/03/2017  . IR US GUIDE VASC ACCESS RIGHT  02/03/2017  . LAPAROSCOPIC PARTIAL COLECTOMY N/A 11/10/2015   Procedure: LAPAROSCOPIC LEFT COLECTOMY LAPAROSCOPIC MOBILIZATION OF SPLENIC FLEXURE;  Surgeon: Greer Pickerel, MD;  Location: [place];  Service: General;  Laterality: N/A;  . Nuclear stress  11/01/2011   Severe global hypokinesis,dilated ventricle  . PORTACATH PLACEMENT N/A 12/29/2015   Procedure: INSERTION PORT-A-CATH WITH ULTRASOUND GUIDANCE;  Surgeon: Greer Pickerel, MD;  [facility];  Service: General;  Laterality: N/A;  . TEE WITHOUT CARDIOVERSION  10/10/2011   Procedure: TRANSESOPHAGEAL ECHOCARDIOGRAM (TEE);  Surgeon: Sanda Klein, MD;  Location: [facility] ENDOSCOPY;  Service: Cardiovascular;  Laterality: N/A;       Home Medications    Prior to Admission medications   Medication Sig Start Date End Date Taking? Authorizing Provider  carvedilol (COREG) 12.5 MG tablet Take 1 tablet (12.5 mg total) by mouth 2 (two) times daily with a meal. 09/11/16   Croitoru, Mihai, MD  doxazosin (CARDURA) 8 MG tablet Take 1 tablet (8 mg total) by mouth at bedtime. 09/11/16   Croitoru, Mihai, MD  furosemide (LASIX) 80 MG tablet Take 1 tablet (80 mg total) by mouth daily. 09/11/16   Croitoru, Mihai, MD  potassium chloride (KLOR-CON M10) 10 MEQ tablet Take 1 tablet (10 mEq total) by mouth every other day.  09/11/16   Croitoru, Mihai, MD  rivaroxaban (XARELTO) 20 MG TABS tablet Take 1 tablet (20 mg total) by mouth daily. 09/11/16   Croitoru, Mihai, MD  rosuvastatin (CRESTOR) 20 MG tablet Take 1 tablet (20 mg total) by mouth every evening. 09/11/16   Croitoru, Mihai, MD  UNABLE TO FIND Glucometer - 1  Lancets - 100  Test strips - 100  Alcohol pads - 100 04/10/16   Oswald Hillock, MD    Family History Family History  Problem Relation Age of Onset  . Colon cancer Mother   . Kidney disease Father        ESRF/HD, deceased    Social History Social History  Substance Use Topics  . Smoking status: Never Smoker  . Smokeless tobacco: Never Used  . Alcohol use No     Allergies   Amiodarone and Allopurinol   Review of Systems Review of Systems  Gastrointestinal: Positive for abdominal pain.  Neurological: Positive for weakness.  All other systems reviewed and are negative.    Physical Exam Updated Vital Signs BP (!) 99/56 (BP Location:  Left Arm)   Pulse 91   Temp 97.6 F (36.4 C) (Oral)   Resp 16   Ht 6' 3"  (1.905 m)   Wt 100.7 kg (222 lb)   SpO2 98%   BMI 27.75 kg/m   Physical Exam  Constitutional: He is oriented to person, place, and time. He appears well-developed and well-nourished.  HENT:  Head: Normocephalic and atraumatic.  Cardiovascular: Normal rate and regular rhythm.   No murmur heard. Pulmonary/Chest: Effort normal and breath sounds normal. No respiratory distress.  Abdominal: Soft. There is no tenderness. There is no rebound and no guarding.  Musculoskeletal: He exhibits edema. He exhibits no tenderness.  1+ pitting edema to BLE, right greater than left.    Neurological: He is alert and oriented to person, place, and time.  5/5 strength in BUE, no pronator drift.  No facial asymmetry.  4/5 strength in BLE.    Skin: Skin is warm and dry.  Psychiatric: He has a normal mood and affect. His behavior is normal.  Nursing note and vitals reviewed.    ED  Treatments / Results  Labs (all labs ordered are listed, but only abnormal results are displayed) Labs Reviewed  COMPREHENSIVE METABOLIC PANEL  CBC WITH DIFFERENTIAL/PLATELET  URINALYSIS, ROUTINE W REFLEX MICROSCOPIC  TROPONIN I    EKG  EKG Interpretation  Date/Time:  Tuesday February 11 2017 09:14:43 EDT Ventricular Rate:  71 PR Interval:    QRS Duration: 145 QT Interval:  448 QTC Calculation: 487 R Axis:   -89 Text Interpretation:  Atrial-sensed ventricular-paced complexes No further analysis attempted due to paced rhythm Confirmed by Quintella Reichert [phone]) on 02/11/2017 9:18:04 AM       Radiology No results found.  Procedures Procedures (including critical care time)  Medications Ordered in ED Medications - No data to display   Initial Impression / Assessment and Plan / ED Course  I have reviewed the triage vital signs and the nursing notes.  Pertinent labs & imaging results that were available during my care of the patient were reviewed by me and considered in my medical decision making (see chart for details).     Patient with metastatic colon cancer here for evaluation of progressive weakness throughout his entire body but greatest over his lower extremities. Labs demonstrate AKI.  CT without evidence of obstruction but does demonstrate progressive disease. Given lower extremity weakness would like to obtain an MRI lumbar spine to further evaluate but given his AKI and pacemaker this is unable to be obtained at this time. Hospitalist consulted for admission for further workup and management.  Final Clinical Impressions(s) / ED Diagnoses   Final diagnoses:  None    New Prescriptions New Prescriptions   No medications on file     Quintella Reichert, MD 02/12/17 [phone]

## 2017-02-11 NOTE — Telephone Encounter (Signed)
Spoke with pt son in the lobby this AM. Wanted to inform Dr. Irene Limbo of some updates in his care. Also noted some history for this RN. Pt finished chemo in January of this year. Recent CT on 01/22/17 showed "spots" on liver and lungs. Pt was seeing Dr. Cheron Schaumann at that time. Scheduled to start new treatment next Tuesday 02/18/17. Since meeting with Dr. Irene Limbo and finding out that cancer has returned, pt has had increasing fatigue and weakness. Noted more trouble walking and not eating as much. Son feels that there has been a shift emotionally since pt received diagnosis. Pt was taken to the ED prior to conversation with son. Kidney and liver function tests abnormal. Plan for pt to be admitted today per ED physician. Information communicated to Dr. Irene Limbo so that he can visit pt in the hospital after clinic today.

## 2017-02-12 ENCOUNTER — Inpatient Hospital Stay (HOSPITAL_COMMUNITY): Payer: Medicare Other

## 2017-02-12 ENCOUNTER — Other Ambulatory Visit: Payer: Self-pay

## 2017-02-12 ENCOUNTER — Other Ambulatory Visit (HOSPITAL_COMMUNITY): Payer: Medicare Other

## 2017-02-12 DIAGNOSIS — R634 Abnormal weight loss: Secondary | ICD-10-CM

## 2017-02-12 DIAGNOSIS — R627 Adult failure to thrive: Secondary | ICD-10-CM

## 2017-02-12 DIAGNOSIS — C189 Malignant neoplasm of colon, unspecified: Secondary | ICD-10-CM

## 2017-02-12 DIAGNOSIS — N179 Acute kidney failure, unspecified: Secondary | ICD-10-CM

## 2017-02-12 DIAGNOSIS — R945 Abnormal results of liver function studies: Secondary | ICD-10-CM

## 2017-02-12 DIAGNOSIS — N189 Chronic kidney disease, unspecified: Secondary | ICD-10-CM

## 2017-02-12 DIAGNOSIS — R5383 Other fatigue: Secondary | ICD-10-CM

## 2017-02-12 DIAGNOSIS — E860 Dehydration: Secondary | ICD-10-CM

## 2017-02-12 DIAGNOSIS — C787 Secondary malignant neoplasm of liver and intrahepatic bile duct: Secondary | ICD-10-CM

## 2017-02-12 LAB — COMPREHENSIVE METABOLIC PANEL
ALK PHOS: 411 U/L — AB (ref 38–126)
ALT: 78 U/L — AB (ref 17–63)
AST: 256 U/L — ABNORMAL HIGH (ref 15–41)
Albumin: 1.8 g/dL — ABNORMAL LOW (ref 3.5–5.0)
Anion gap: 12 (ref 5–15)
BILIRUBIN TOTAL: 1.5 mg/dL — AB (ref 0.3–1.2)
BUN: 47 mg/dL — ABNORMAL HIGH (ref 6–20)
CALCIUM: 8.5 mg/dL — AB (ref 8.9–10.3)
CO2: 17 mmol/L — AB (ref 22–32)
CREATININE: 3.61 mg/dL — AB (ref 0.61–1.24)
Chloride: 107 mmol/L (ref 101–111)
GFR calc Af Amer: 18 mL/min — ABNORMAL LOW (ref >60)
GFR calc non Af Amer: 15 mL/min — ABNORMAL LOW (ref >60)
Glucose, Bld: 189 mg/dL — ABNORMAL HIGH (ref 65–99)
Potassium: 3.7 mmol/L (ref 3.5–5.1)
SODIUM: 136 mmol/L (ref 135–145)
TOTAL PROTEIN: 5.8 g/dL — AB (ref 6.5–8.1)

## 2017-02-12 LAB — CBC
HCT: 24.9 % — ABNORMAL LOW (ref 39.0–52.0)
Hemoglobin: 8.5 g/dL — ABNORMAL LOW (ref 13.0–17.0)
MCH: 22.7 pg — AB (ref 26.0–34.0)
MCHC: 34.1 g/dL (ref 30.0–36.0)
MCV: 66.4 fL — ABNORMAL LOW (ref 78.0–100.0)
PLATELETS: 162 10*3/uL (ref 150–400)
RBC: 3.75 MIL/uL — AB (ref 4.22–5.81)
RDW: 18.4 % — ABNORMAL HIGH (ref 11.5–15.5)
WBC: 12.8 10*3/uL — ABNORMAL HIGH (ref 4.0–10.5)

## 2017-02-12 LAB — GLUCOSE, CAPILLARY
GLUCOSE-CAPILLARY: 259 mg/dL — AB (ref 65–99)
GLUCOSE-CAPILLARY: 172 mg/dL — AB (ref 65–99)

## 2017-02-12 LAB — CREATININE, URINE, RANDOM: CREATININE, URINE: 103.59 mg/dL

## 2017-02-12 LAB — SODIUM, URINE, RANDOM: Sodium, Ur: <10 mmol/L

## 2017-02-12 LAB — TROPONIN I
TROPONIN I: 0.07 ng/mL — AB (ref <0.03)
TROPONIN I: 0.08 ng/mL — AB (ref <0.03)
Troponin I: 0.08 ng/mL (ref <0.03)

## 2017-02-12 MED ORDER — TECHNETIUM TC 99M MEDRONATE IV KIT
20.7000 | PACK | Freq: Once | INTRAVENOUS | Status: AC
  Administered 2017-02-12: 20.7 via INTRAVENOUS

## 2017-02-12 MED ORDER — DIPHENHYDRAMINE HCL 25 MG PO CAPS
25.0000 mg | ORAL_CAPSULE | Freq: Every evening | ORAL | Status: DC | PRN
  Administered 2017-02-12: 50 mg via ORAL
  Filled 2017-02-12: qty 2

## 2017-02-12 MED ORDER — HEPARIN SODIUM (PORCINE) 5000 UNIT/ML IJ SOLN
5000.0000 [IU] | Freq: Three times a day (TID) | INTRAMUSCULAR | Status: DC
  Administered 2017-02-12 – 2017-02-13 (×2): 5000 [IU] via SUBCUTANEOUS
  Filled 2017-02-12 (×2): qty 1

## 2017-02-12 MED ORDER — SODIUM CHLORIDE 0.9 % IV SOLN
INTRAVENOUS | Status: DC
  Administered 2017-02-12: 75 mL/h via INTRAVENOUS
  Administered 2017-02-13: 05:00:00 via INTRAVENOUS

## 2017-02-12 MED ORDER — INSULIN ASPART 100 UNIT/ML ~~LOC~~ SOLN
0.0000 [IU] | Freq: Three times a day (TID) | SUBCUTANEOUS | Status: DC
  Administered 2017-02-12: 5 [IU] via SUBCUTANEOUS
  Administered 2017-02-12: 2 [IU] via SUBCUTANEOUS
  Administered 2017-02-13: 3 [IU] via SUBCUTANEOUS
  Administered 2017-02-13 (×2): 2 [IU] via SUBCUTANEOUS
  Administered 2017-02-14 – 2017-02-15 (×3): 1 [IU] via SUBCUTANEOUS

## 2017-02-12 MED ORDER — ENSURE ENLIVE PO LIQD
237.0000 mL | Freq: Two times a day (BID) | ORAL | Status: DC
  Administered 2017-02-12 – 2017-02-17 (×2): 237 mL via ORAL

## 2017-02-12 MED ORDER — SODIUM CHLORIDE 0.9 % IV SOLN: INTRAVENOUS | Status: DC

## 2017-02-12 NOTE — Consult Note (Addendum)
Renal Service Consult Note [place] 02/12/2017 [place] Requesting Physician:  Dr Rodena Piety  Reason for Consult:  Renal failure HPI: The patient is a 76 y.o. year-old w/ history of DM2, colon cancer metastatic (lungs, liver, bones), HTN, CM and CKD stage 3 presented yesterday 9/4 with abd pain x 3 weeks to ED.  Was recently dx'd with recurrent cancer and was supposed to start chemo soon. BP's in ED were low, pt rec'd some IVF's.  Creat was 1.7 at baseline in August, but on admission was up to 3.2 and then today up to 3.61.  Asked to see for acute/ chron renal failure.   Pt presented with Fe def anemia in mid 2017 and was found to have invasive colon Ca in transverse colon w mult polyps.  Had lap left colectomy in June, was dx'd with stage IIIb colon Ca and had adjuvant chemoRx w FOLFOX starting July 2017.  Oxaloplatin was dc'd and 5-FU added after he developed fatigue on the original regimen.  Creat remained stable in 1.4- 1.8 range (CKD). He completed 12th cycle in Jan 2018 and the port was then removed.   As part of surveillance, CT scan recently done reportedly showed new lung and liver mets.  Also CEA levels have gone up a lot forom 1.6 in Jan to 2,344 on 01/27/17.   Pt says he feels better today than yest.  He stopped his po lasix about 4-5 days ago after falling and feeling too weak.  He states that he rec'd 2 CT scans, one from his PCP and then one from his oncologist in August. The 2nd one on 8/15 is in our system and was done w/o IV contrast.    He denies hx of kidney problems, nsaids .  Daughter notes they have told him while getting chemo last year that the "kidney numbers were up" but remained stable with the chemo.  He was lightheaded yest but better today.  No n/v/d.  No abd pain today, no CP, no fevers.     Date  Creat  eGFR 2013- 2016 1.20- 1.97 37- 78  2017  1.5- 1.8 23- 53 Jan 2018 1.14 Jan 2017 1.7 02/11/17  3.22  20 02/12/17  3.61  18   ROS  denies CP, no SOB no orthopnea or PND  no joint pain   no HA  no blurry vision  no rash  no dysuria  no difficulty voiding  no change in urine color    Past Medical History  Past Medical History:  Diagnosis Date  . CKD (chronic kidney disease) stage 3, GFR 30-59 ml/min    kidney function concerns with chemo  . Complete heart block ([place])    s. 05/02/2015 s/p BSX U128 Valitude CRT-P Beckie Salts).  Marland Kitchen GIB (gastrointestinal bleeding)    a. 10/2015- Hgb 4.6-->8u PRBC's;  b. 10/2015 EGD: non bleeding H pylori + ulceration; c. 10/2015 Colonoscopy: invasive adenocarcinoma.  . History of cardiomyopathy    a. Nonischemic, possibly tachycardia mediated;  b. 04/2015 Echo:  EF 60-65%, no rwma, mild AI/MR, mod dil LA/RA, PASP 15mHg.  .Marland KitchenHypertension   . Hypertensive heart disease   . Paroxysmal atrial fibrillation (HCC)    a. CHA2DS2VASc = 2-3 (Xarelto).  . Stage IIIB Colon Cancer    a. 11/2015 Colonscopy: invasive adenocarcinoma;  b. 11/2015 s/p lap L colectomy; c. Pathology: pT3, pN1c Mx, MSI stable.  . Type 2 diabetes mellitus ([place]  Past Surgical History  Past Surgical History:  Procedure Laterality Date  . CARDIAC CATHETERIZATION    . CARDIOVERSION  10/11/2011   Procedure: CARDIOVERSION;  Surgeon: Leonie Man, MD;  Location: [place];  Service: Cardiovascular;  Laterality: N/A;  . COLONOSCOPY N/A 11/05/2015   Procedure: COLONOSCOPY;  Surgeon: Ronald Lobo, MD;  Location: [facility] ENDOSCOPY;  Service: Endoscopy;  Laterality: N/A;  . EP IMPLANTABLE DEVICE N/A 05/02/2015   Procedure: BiV Pacemaker Insertion CRT-P;  Surgeon: Evans Lance, MD;  Location: [place] CV LAB;  Service: Cardiovascular;  Laterality: N/A;  . ESOPHAGOGASTRODUODENOSCOPY N/A 11/05/2015   Procedure: ESOPHAGOGASTRODUODENOSCOPY (EGD);  Surgeon: Ronald Lobo, MD;  Location: [facility] ENDOSCOPY;  Service: Endoscopy;  Laterality: N/A;  . EYE SURGERY  2015   left cataract with  lens implant  . IR FLUORO GUIDE PORT INSERTION RIGHT  02/03/2017  . IR US GUIDE VASC ACCESS RIGHT  02/03/2017  . LAPAROSCOPIC PARTIAL COLECTOMY N/A 11/10/2015   Procedure: LAPAROSCOPIC LEFT COLECTOMY LAPAROSCOPIC MOBILIZATION OF SPLENIC FLEXURE;  Surgeon: Greer Pickerel, MD;  Location: [place];  Service: General;  Laterality: N/A;  . Nuclear stress  11/01/2011   Severe global hypokinesis,dilated ventricle  . PORTACATH PLACEMENT N/A 12/29/2015   Procedure: INSERTION PORT-A-CATH WITH ULTRASOUND GUIDANCE;  Surgeon: Greer Pickerel, MD;  [facility]: [facility];  Service: General;  Laterality: N/A;  . TEE WITHOUT CARDIOVERSION  10/10/2011   Procedure: TRANSESOPHAGEAL ECHOCARDIOGRAM (TEE);  Surgeon: Sanda Klein, MD;  Location: [facility] ENDOSCOPY;  Service: Cardiovascular;  Laterality: N/A;   Family History  Family History  Problem Relation Age of Onset  . Colon cancer Mother   . Kidney disease Father        ESRF/HD, deceased   Social History  reports that he has never smoked. He has never used smokeless tobacco. He reports that he does not drink alcohol or use drugs. Allergies  Allergies  Allergen Reactions  . Amiodarone Shortness Of Breath    PULMONARY TOXICITY with AMIODARONE  . Allopurinol     unknown   Home medications Prior to Admission medications   Medication Sig Start Date End Date Taking? Authorizing Provider  acetaminophen (TYLENOL) 500 MG tablet Take 1,000 mg by mouth every 6 (six) hours as needed for mild pain.   Yes [provider]  carvedilol (COREG) 12.5 MG tablet Take 1 tablet (12.5 mg total) by mouth 2 (two) times daily with a meal. 09/11/16  Yes Croitoru, Mihai, MD  doxazosin (CARDURA) 8 MG tablet Take 1 tablet (8 mg total) by mouth at bedtime. 09/11/16  Yes Croitoru, Mihai, MD  furosemide (LASIX) 80 MG tablet Take 1 tablet (80 mg total) by mouth daily. 09/11/16  Yes Croitoru, Mihai, MD  potassium chloride (KLOR-CON M10) 10 MEQ tablet Take 1 tablet (10 mEq total) by mouth every other day.  09/11/16  Yes Croitoru, Mihai, MD  rosuvastatin (CRESTOR) 20 MG tablet Take 1 tablet (20 mg total) by mouth every evening. 09/11/16  Yes Croitoru, Mihai, MD  rivaroxaban (XARELTO) 20 MG TABS tablet Take 1 tablet (20 mg total) by mouth daily. 09/11/16   Croitoru, Mihai, MD  UNABLE TO FIND Glucometer - 1  Lancets - 100  Test strips - 100  Alcohol pads - 100 04/10/16   Oswald Hillock, MD   Liver Function Tests  Recent Labs Lab 02/11/17 0940 02/12/17 0622  AST 230* 256*  ALT 73* 78*  ALKPHOS 446* 411*  BILITOT 2.2* 1.5*  PROT 6.2* 5.8*  ALBUMIN 2.0* 1.8*   No  results for input(s): LIPASE, AMYLASE in the last 168 hours. CBC  Recent Labs Lab 02/11/17 0940 02/12/17 0622  WBC 13.8* 12.8*  NEUTROABS 10.5*  --   HGB 9.5* 8.5*  HCT 28.1* 24.9*  MCV 67.1* 66.4*  PLT 148* 270   Basic Metabolic Panel  Recent Labs Lab 02/11/17 0940 02/12/17 0622  NA 133* 136  K 4.0 3.7  CL 104 107  CO2 18* 17*  GLUCOSE 98 189*  BUN 38* 47*  CREATININE 3.22* 3.61*  CALCIUM 9.3 8.5*   Iron/TIBC/Ferritin/ %Sat    Component Value Date/Time   IRON 24 (L) 11/22/2015 0952   TIBC 244 11/22/2015 0952   FERRITIN 100 01/24/2016 0830   IRONPCTSAT 10 (L) 11/22/2015 0952    Vitals:   02/11/17 1146 02/11/17 1508 02/11/17 2141 02/12/17 0519  BP: 99/62 135/70 (!) 117/57 (!) 115/57  Pulse: 61 63 62 70  Resp: _0 Temp:   97.6 F (36.4 C) 97.6 F (36.4 C)  TempSrc:   Oral Oral  SpO2: 95% 100% 100% 97%  Weight:      Height:       Exam Gen alert, no distress No rash, cyanosis or gangrene Sclera anicteric, throat clear  No jvd or bruits Chest clear bilat to bases RRR no MRG Abd soft ntnd no mass or ascites +bs, no hsm GU normal male MS no joint effusions or deformity Ext 1+ bilat ankle edema R > L  No wounds or ulcers Neuro is alert, Ox 3 , nf  Na 136  K 3.7  CO2 17  BUN 47  Cr 3.61   Alb 1.8  AST 256/  ALT 78   Tbili 1.5   Alk phos 411 WBC 12k  Hb 8.5  plt 162  Last echo 2016  > EF 60-65%, PAP 36m.  Est CVP 3  CXR 9/4 > new patchy infiltrates c/w edema or infection , vs other  CT abd 9/4 >  Innumerable/diffuse nodules throughout the lungs compatible with metastatic disease. Innumerable large hepatic metastases, similar to prior study. Left lower pole nephrolithiasis. Free fluid adjacent to the liver and in the right abdomen/ pelvis. Diffuse sclerotic appearance of the bony structures without visible destructive process or lytic lesion. Cannot completely exclude diffuse metastatic involvement. Consider further evaluation with bone scan.  UA > 100 prot, 6-30 rbc, 0-5 wbc  Home meds :  - coreg 12.5 bid/ lasix 80 qd/ KCL/ doxazosin 8  - crestor / xarelto 20 qd  Impression: 1.  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  AKI most likely hypoperfusion related to low BP' and low intravasc volume/ 3rd spacing from low alb.  Hasn't been eating well at home and was on lasix until he stopped recently.  ATN less likely.  Does have mild ankle edema but suspect intravasc pressures still low.  The CXR findings I believe are diffuse nodular mets and not edema. Has no JVD or rales. BP's are soft as well. Will stop his coreg and cardura.   Will cont IVF"s but reduce rate.  Poor prognosis overall. 2.  Metastatic colon Ca 3.  HTN - bp's soft, as above 4.  Abd pain - better 5.  Hx afib - on Xarelto 6.  DM on insulin 7.  Hx CHB - sp PPM. EKG w/ paced rhythm.     Plan - as above  RKelly SplinterMD CNewell Rubbermaidpager [phone]  02/12/2017, 12:04 PM

## 2017-02-12 NOTE — Progress Notes (Signed)
PHARMACY CONSULT: Heparin for VTE prophylaxis   H/H: 8.5/24.9 Pltc: 162  A/P: Noted patient previously on Xarelto for h/o afib but was reported discontinued 3 weeks ago per admission H&P.  Patient has been off anticoagulation this admission so far.  Currently has SCDs for VTE prophylaxis and pharmacy consulted to order heparin for VTE prophylaxis.  Start Heparin 5000 units SQ q8h.  No adjustments needed.  Pharmacy will sign of at this time.  Please re-consult if needed.  Hershal Coria, PharmD Pager: [phone] 02/12/2017

## 2017-02-12 NOTE — Progress Notes (Signed)
I spoke with the patient's daughter and nurse and he's neurologically intact during checks and will have bone scan later this afternoon. We will follow up with results when available.

## 2017-02-12 NOTE — Progress Notes (Signed)
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 Patient was given Decadron in the emergency room and is on it at this time for possible spinal compression from metastatic colon cancer. Patient reports that his strength has come back. Patient scheduled for bone scan today as he couldn't have an MRI due to pacemaker placement and a CT scan because with the acute on chronic kidney disease. Patient is a very awake alert answered all the courses appropriately he is in good spirits.   Assessment & Plan:   Principal Problem:   SCC (spinal cord compression) (HCC) Active Problems:   Chronic combined systolic and diastolic CHF, NYHA class 1 (HCC)   Paroxysmal atrial fibrillation (HCC)   Dehydration   ARF (acute renal failure) (HCC)   Suspected spinal cord compression on Decadron. Will continue Decadron to bone scan is back. Patient's strength has improved since admission it probably is related to IV hydration plus or minus Decadron. His blood sugar have been mildly elevated due to steroids I will place him on sliding scale insulin. Patient does not carry a history of type 2 diabetes.  Hypertension improved with hydration and Lasix being on hold. Appreciate nephrology consultation.  Acute on chronic kidney disease. Patient does not have urinary hydroureter or ureteral obstruction on CT scan so he does not need a ultrasound at this time therefore will DC the ultrasound of the kidneys. Continue slow IV hydration and follow-up levels tomorrow.  Diastolic CHF stable at this time continue slow hydration and monitor closely.  History of atrial fibrillation rate controlled at this time, will carvedilol has been stopped today along with the Cardura which  was also being stopped due to hypotension. This patient was on Zarontin O in the past. xarelto has not been restarted per patient.  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 Overall prognosis is grim. We will place a call to Dr. Irene Limbo.  Transaminitis due to liver metastases.     DVT prophylaxis: scd Code Status: full Family Communication: Disposition Plan:    Consultants:  nephrology  Procedures:   Antimicrobials:    Subjective: Feels better  Objective: Vitals:   02/11/17 1508 02/11/17 2141 02/12/17 0519 02/12/17 1409  BP: 135/70 (!) 117/57 (!) 115/57 120/60  Pulse: 63 62 70 72  Resp: 14 19 17 17   Temp:  97.6 F (36.4 C) 97.6 F (36.4 C) 98 F (36.7 C)  TempSrc:  Oral Oral Oral  SpO2: 100% 100% 97% 97%  Weight:      Height:        Intake/Output Summary (Last 24 hours) at 02/12/17 1458 Last data filed at 02/12/17 0951  Gross per 24 hour  Intake          1559.58 ml  Output              140 ml  Net          1419.58 ml   Filed Weights   02/11/17 0901  Weight: 100.7 kg (222 lb)    Examination:  General exam: Appears calm and comfortable  Respiratory system: Clear to auscultation. Respiratory effort normal. Cardiovascular system: S1 & S2 heard, RRR. No JVD, murmurs, rubs, gallops or  clicks. No pedal edema. Gastrointestinal system: Abdomen is nondistended, soft and nontender. No organomegaly or masses felt. Normal bowel sounds heard. Central nervous system: Alert and oriented. No focal neurological deficits. Extremities: Symmetric 5 x 5 power. Skin: No rashes, lesions or ulcers Psychiatry: Judgement and insight appear normal. Mood & affect appropriate.     Data Reviewed: I have personally reviewed following labs and imaging studies  CBC:  Recent Labs Lab 02/11/17 0940 02/12/17 0622  WBC 13.8* 12.8*  NEUTROABS 10.5*  --   HGB 9.5* 8.5*  HCT 28.1* 24.9*  MCV  67.1* 66.4*  PLT 148* 497   Basic Metabolic Panel:  Recent Labs Lab 02/11/17 0940 02/12/17 0622  NA 133* 136  K 4.0 3.7  CL 104 107  CO2 18* 17*  GLUCOSE 98 189*  BUN 38* 47*  CREATININE 3.22* 3.61*  CALCIUM 9.3 8.5*   GFR: Estimated Creatinine Clearance: 21.1 mL/min (A) (by C-G formula based on SCr of 3.61 mg/dL (H)). Liver Function Tests:  Recent Labs Lab 02/11/17 0940 02/12/17 0622  AST 230* 256*  ALT 73* 78*  ALKPHOS 446* 411*  BILITOT 2.2* 1.5*  PROT 6.2* 5.8*  ALBUMIN 2.0* 1.8*   No results for input(s): LIPASE, AMYLASE in the last 168 hours. No results for input(s): AMMONIA in the last 168 hours. Coagulation Profile: No results for input(s): INR, PROTIME in the last 168 hours. Cardiac Enzymes:  Recent Labs Lab 02/11/17 0940 02/11/17 1909 02/12/17 0622 02/12/17 1314  TROPONINI 0.09* 0.04* 0.08* 0.07*   BNP (last 3 results) No results for input(s): PROBNP in the last 8760 hours. HbA1C: No results for input(s): HGBA1C in the last 72 hours. CBG:  Recent Labs Lab 02/12/17 1148  GLUCAP 259*   Lipid Profile: No results for input(s): CHOL, HDL, LDLCALC, TRIG, CHOLHDL, LDLDIRECT in the last 72 hours. Thyroid Function Tests: No results for input(s): TSH, T4TOTAL, FREET4, T3FREE, THYROIDAB in the last 72 hours. Anemia Panel: No results for input(s): VITAMINB12, FOLATE, FERRITIN, TIBC, IRON, RETICCTPCT in the last 72 hours. Sepsis Labs: No results for input(s): PROCALCITON, LATICACIDVEN in the last 168 hours.  No results found for this or any previous visit (from the past 240 hour(s)).       Radiology Studies: Ct Abdomen Pelvis Wo Contrast  Result Date: 02/11/2017 CLINICAL DATA:  Abdominal distention.  Colon cancer. EXAM: CT CHEST, ABDOMEN AND PELVIS WITHOUT CONTRAST TECHNIQUE: Multidetector CT imaging of the chest, abdomen and pelvis was performed following the standard protocol without IV contrast. COMPARISON:  CT 01/22/2017.  Chest CT  11/08/2015 FINDINGS: CT CHEST FINDINGS Cardiovascular: Pacer wires noted in the right heart. Severe diffuse coronary artery calcifications. Scattered aortic calcifications. No aneurysm. Heart is normal size. Mediastinum/Nodes: Innumerable bilateral small pulmonary nodules compatible with diffuse metastatic involvement of the lungs. Small right pleural effusion. Lungs/Pleura: No mediastinal, hilar, or axillary adenopathy. Musculoskeletal: Chest wall soft tissues are unremarkable. Degenerative changes in the thoracic spine. Diffuse sclerotic appearance of the bony structures without focal lytic lesion or destructive process. CT ABDOMEN PELVIS FINDINGS Hepatobiliary: Innumerable large masses throughout the liver, difficult to measure due to the noncontrast nature of the study. Findings are likely similar to recent abdominal CT. Gallbladder unremarkable. Pancreas: No focal abnormality or ductal dilatation. Spleen: Small low-density lesion in the posterior spleen on image 54 measures 13 mm. Cannot exclude metastasis. Adrenals/Urinary Tract: Small low-density lesions in the kidneys bilaterally, stable, likely small cysts. Punctate nonobstructing stone in the lower pole of the left kidney. Adrenal glands and  urinary bladder unremarkable. Stomach/Bowel: Postoperative changes in the distal transverse colon near the splenic flexure. Colonic diverticulosis. No visible active diverticulitis. Stomach and small bowel decompressed, unremarkable. Vascular/Lymphatic: Aortic and iliac calcifications. No aneurysm or adenopathy. Reproductive: Prostate enlargement. Other: No free air. Free fluid adjacent to the liver and extending in the right paracolic gutter and right pelvis. Musculoskeletal: No focal lytic or destructive bone lesion. Diffuse degenerative changes in the lumbar spine. Diffuse sclerotic appearance of the bones. IMPRESSION: Innumerable/diffuse nodules throughout the lungs compatible with metastatic disease. Small right  pleural effusion. Extensive coronary artery disease. Innumerable large hepatic metastases, similar to prior study. Small hypodensity in the spleen, cannot exclude metastasis. Left lower pole nephrolithiasis. Free fluid adjacent to the liver and in the right abdomen/ pelvis. Diffuse sclerotic appearance of the bony structures without visible destructive process or lytic lesion. Cannot completely exclude diffuse metastatic involvement. Consider further evaluation with bone scan. Electronically Signed   By: Rolm Baptise M.D.   On: 02/11/2017 11:40   Dg Chest 2 View  Result Date: 02/11/2017 CLINICAL DATA:  Weakness, shortness of Breath EXAM: CHEST  2 VIEW COMPARISON:  12/29/2015 FINDINGS: Right Port-A-Cath and left pacer remain in place, unchanged. Patchy bilateral airspace opacities are noted, new since prior study. Heart is upper limits normal in size. No effusions or acute bony abnormality. IMPRESSION: New patchy bilateral airspace disease which could reflect edema or infection. Electronically Signed   By: Rolm Baptise M.D.   On: 02/11/2017 10:45   Ct Head Wo Contrast  Result Date: 02/11/2017 CLINICAL DATA:  Weakness, difficulty walking. EXAM: CT HEAD WITHOUT CONTRAST TECHNIQUE: Contiguous axial images were obtained from the base of the skull through the vertex without intravenous contrast. COMPARISON:  None. FINDINGS: Brain: No acute intracranial abnormality. Specifically, no hemorrhage, hydrocephalus, mass lesion, acute infarction, or significant intracranial injury. Vascular: No hyperdense vessel or unexpected calcification. Skull: No acute calvarial abnormality. Sinuses/Orbits: Mucosal thickening in the left maxillary sinus. No air-fluid levels. Mastoid air cells are clear. Orbital soft tissues unremarkable. Other: None IMPRESSION: No acute intracranial abnormality. Chronic left maxillary sinusitis. Electronically Signed   By: Rolm Baptise M.D.   On: 02/11/2017 10:23   Ct Chest Wo Contrast  Result Date:  02/11/2017 CLINICAL DATA:  Abdominal distention.  Colon cancer. EXAM: CT CHEST, ABDOMEN AND PELVIS WITHOUT CONTRAST TECHNIQUE: Multidetector CT imaging of the chest, abdomen and pelvis was performed following the standard protocol without IV contrast. COMPARISON:  CT 01/22/2017.  Chest CT 11/08/2015 FINDINGS: CT CHEST FINDINGS Cardiovascular: Pacer wires noted in the right heart. Severe diffuse coronary artery calcifications. Scattered aortic calcifications. No aneurysm. Heart is normal size. Mediastinum/Nodes: Innumerable bilateral small pulmonary nodules compatible with diffuse metastatic involvement of the lungs. Small right pleural effusion. Lungs/Pleura: No mediastinal, hilar, or axillary adenopathy. Musculoskeletal: Chest wall soft tissues are unremarkable. Degenerative changes in the thoracic spine. Diffuse sclerotic appearance of the bony structures without focal lytic lesion or destructive process. CT ABDOMEN PELVIS FINDINGS Hepatobiliary: Innumerable large masses throughout the liver, difficult to measure due to the noncontrast nature of the study. Findings are likely similar to recent abdominal CT. Gallbladder unremarkable. Pancreas: No focal abnormality or ductal dilatation. Spleen: Small low-density lesion in the posterior spleen on image 54 measures 13 mm. Cannot exclude metastasis. Adrenals/Urinary Tract: Small low-density lesions in the kidneys bilaterally, stable, likely small cysts. Punctate nonobstructing stone in the lower pole of the left kidney. Adrenal glands and urinary bladder unremarkable. Stomach/Bowel: Postoperative changes in the distal transverse colon near the  splenic flexure. Colonic diverticulosis. No visible active diverticulitis. Stomach and small bowel decompressed, unremarkable. Vascular/Lymphatic: Aortic and iliac calcifications. No aneurysm or adenopathy. Reproductive: Prostate enlargement. Other: No free air. Free fluid adjacent to the liver and extending in the right paracolic  gutter and right pelvis. Musculoskeletal: No focal lytic or destructive bone lesion. Diffuse degenerative changes in the lumbar spine. Diffuse sclerotic appearance of the bones. IMPRESSION: Innumerable/diffuse nodules throughout the lungs compatible with metastatic disease. Small right pleural effusion. Extensive coronary artery disease. Innumerable large hepatic metastases, similar to prior study. Small hypodensity in the spleen, cannot exclude metastasis. Left lower pole nephrolithiasis. Free fluid adjacent to the liver and in the right abdomen/ pelvis. Diffuse sclerotic appearance of the bony structures without visible destructive process or lytic lesion. Cannot completely exclude diffuse metastatic involvement. Consider further evaluation with bone scan. Electronically Signed   By: Rolm Baptise M.D.   On: 02/11/2017 11:40        Scheduled Meds: . dexamethasone  4 mg Intravenous Q6H  . feeding supplement (ENSURE ENLIVE)  237 mL Oral BID BM  . insulin aspart  0-9 Units Subcutaneous TID WC  . sodium chloride flush  10-40 mL Intracatheter Q12H   Continuous Infusions: . sodium chloride 75 mL/hr at 02/12/17 1421  . sodium chloride       LOS: 1 day    Time spent:    Georgette Shell, MD [facility]  If 7PM-7AM, please contact night-coverage [email or web address] Password [email or web address] 02/12/2017, 2:58 PM in a

## 2017-02-12 NOTE — Progress Notes (Signed)
Initial Nutrition Assessment  DOCUMENTATION CODES:   Not applicable  INTERVENTION:   RD to put note in diet order to send moist meat per pt's request.  Ensure Enlive po BID, each supplement provides 350 kcal and 20 grams of protein  NUTRITION DIAGNOSIS:   Unintentional weight loss related to cancer and cancer related treatments as evidenced by 10% weight loss in 5 months.  GOAL:   Patient will meet greater than or equal to 90% of their needs  MONITOR:   PO intake, Supplement acceptance, Labs, Weight trends  REASON FOR ASSESSMENT:   Malnutrition Screening Tool    ASSESSMENT:   Pt with PMH of CKD stg III, HTN, Stg III colon cancer, and DM. Pt recently had two CAT scans which revealed spread of colon cancer worse than anticipated. Was mobilizing well until a month ago when his back pain increasingly got worse. Pt reports having chemotherapy previously in January? with significant amount of wt loss. Presents this admission with concern for spinal cord compression increasing his lower back pain.   Spoke with pt at bedside. Pt having decrease in appetite for 3-4 months related taste changes and difficulty with meat textures. Reports eating 3 meals a day that consist of moist foods like sweet potatoes, beans, and regular potatoes. Pt consumed Boost at home until recently when he could not tolerate the taste. Pt not currently on chemo treatment but is to start 9/12 per his reports. Dicussed potential appetite changes when chemo begins and the importance of protein to maintain lean body mass.    Records indicate pt has lost 10 % of body wt in 5 months. This percentage in this time frame is significant. Nutrition-Focused physical exam completed. Findings are no fat depletion, mild muscle depletion in clavicle/acromion region, and no edema. Pt does not meet malnutrition criteria at this time. If poor appetite and wt loss persist pt is at high risk for malnutrition. If pt begins chemotherapy  would recommend outpatient dietitian, as pt lost significant wt last treatment. RD to put note in diet order to send moist meat per pt's request along with supplementation.   Medications reviewed and include: NS @ 125 ml/hr  Labs reviewed: Creatinine 3.61 (H) BUN 47 (H) Albumin 1.8 (L) AST 256 (H) ALT 78 (H)   Diet Order:  Diet regular Room service appropriate? Yes; Fluid consistency: Thin  Skin:  Reviewed, no issues  Last BM:  02/10/17  Height:   Ht Readings from Last 1 Encounters:  02/11/17 6\' 3"  (1.905 m)    Weight:   Wt Readings from Last 1 Encounters:  02/11/17 222 lb (100.7 kg)    Ideal Body Weight:  89.1 kg  BMI:  Body mass index is 27.75 kg/m.  Estimated Nutritional Needs:   Kcal:  0867 (MSJ)  Protein:  105-115 grams (1.2-1.3 g/kg IBW)  Fluid:  >1.8 L/day  EDUCATION NEEDS:   Education needs addressed  [place], LDN Clinical Nutrition Pager # [phone]

## 2017-02-13 ENCOUNTER — Inpatient Hospital Stay (HOSPITAL_COMMUNITY): Payer: Medicare Other

## 2017-02-13 DIAGNOSIS — C787 Secondary malignant neoplasm of liver and intrahepatic bile duct: Secondary | ICD-10-CM

## 2017-02-13 DIAGNOSIS — E43 Unspecified severe protein-calorie malnutrition: Secondary | ICD-10-CM

## 2017-02-13 DIAGNOSIS — R9431 Abnormal electrocardiogram [ECG] [EKG]: Secondary | ICD-10-CM

## 2017-02-13 DIAGNOSIS — I5042 Chronic combined systolic (congestive) and diastolic (congestive) heart failure: Secondary | ICD-10-CM

## 2017-02-13 DIAGNOSIS — Z7189 Other specified counseling: Secondary | ICD-10-CM

## 2017-02-13 LAB — PROTIME-INR
INR: 1.94
PROTHROMBIN TIME: 22 s — AB (ref 11.4–15.2)

## 2017-02-13 LAB — COMPREHENSIVE METABOLIC PANEL
ALBUMIN: 1.8 g/dL — AB (ref 3.5–5.0)
ALK PHOS: 493 U/L — AB (ref 38–126)
ALT: 91 U/L — AB (ref 17–63)
AST: 256 U/L — ABNORMAL HIGH (ref 15–41)
Anion gap: 16 — ABNORMAL HIGH (ref 5–15)
BILIRUBIN TOTAL: 1.4 mg/dL — AB (ref 0.3–1.2)
BUN: 68 mg/dL — AB (ref 6–20)
CALCIUM: 8.2 mg/dL — AB (ref 8.9–10.3)
CO2: 14 mmol/L — ABNORMAL LOW (ref 22–32)
CREATININE: 4.54 mg/dL — AB (ref 0.61–1.24)
Chloride: 101 mmol/L (ref 101–111)
GFR calc Af Amer: 13 mL/min — ABNORMAL LOW (ref >60)
GFR, EST NON AFRICAN AMERICAN: 11 mL/min — AB (ref >60)
Glucose, Bld: 234 mg/dL — ABNORMAL HIGH (ref 65–99)
POTASSIUM: 3.8 mmol/L (ref 3.5–5.1)
Sodium: 131 mmol/L — ABNORMAL LOW (ref 135–145)
TOTAL PROTEIN: 6.2 g/dL — AB (ref 6.5–8.1)

## 2017-02-13 LAB — BASIC METABOLIC PANEL
ANION GAP: 12 (ref 5–15)
BUN: 64 mg/dL — ABNORMAL HIGH (ref 6–20)
CALCIUM: 8.4 mg/dL — AB (ref 8.9–10.3)
CO2: 17 mmol/L — ABNORMAL LOW (ref 22–32)
CREATININE: 4.3 mg/dL — AB (ref 0.61–1.24)
Chloride: 107 mmol/L (ref 101–111)
GFR, EST AFRICAN AMERICAN: 14 mL/min — AB (ref >60)
GFR, EST NON AFRICAN AMERICAN: 12 mL/min — AB (ref >60)
Glucose, Bld: 157 mg/dL — ABNORMAL HIGH (ref 65–99)
Potassium: 4 mmol/L (ref 3.5–5.1)
Sodium: 136 mmol/L (ref 135–145)

## 2017-02-13 LAB — TROPONIN I: TROPONIN I: 0.09 ng/mL — AB (ref <0.03)

## 2017-02-13 LAB — GLUCOSE, CAPILLARY
GLUCOSE-CAPILLARY: 231 mg/dL — AB (ref 65–99)
Glucose-Capillary: 162 mg/dL — ABNORMAL HIGH (ref 65–99)
Glucose-Capillary: 198 mg/dL — ABNORMAL HIGH (ref 65–99)

## 2017-02-13 LAB — ECHOCARDIOGRAM COMPLETE
HEIGHTINCHES: 75 in
Weight: 3552 oz

## 2017-02-13 LAB — AMMONIA: Ammonia: 55 umol/L — ABNORMAL HIGH (ref 9–35)

## 2017-02-13 MED ORDER — SODIUM CHLORIDE 0.9 % IV SOLN
INTRAVENOUS | Status: DC
  Administered 2017-02-13 – 2017-02-14 (×3): via INTRAVENOUS

## 2017-02-13 MED ORDER — SODIUM CHLORIDE 0.9 % IV BOLUS (SEPSIS)
500.0000 mL | Freq: Once | INTRAVENOUS | Status: AC
  Administered 2017-02-13: 500 mL via INTRAVENOUS

## 2017-02-13 MED ORDER — HEPARIN SODIUM (PORCINE) 5000 UNIT/ML IJ SOLN
5000.0000 [IU] | Freq: Two times a day (BID) | INTRAMUSCULAR | Status: DC
  Administered 2017-02-13 – 2017-02-14 (×3): 5000 [IU] via SUBCUTANEOUS
  Filled 2017-02-13 (×3): qty 1

## 2017-02-13 NOTE — Progress Notes (Signed)
PROGRESS NOTE    Ernest Lara  VOH:[id number] DOB: 1941/02/14 DOA: 02/11/2017 PCP: Jonathon Jordan, MD (Confirm with patient/family/NH records and if not entered, this HAS to be entered at [facility] point of entry. "No PCP" if truly none.)   Brief Narrative: 76 yo male with a history of metastatic colon cancer with metastases to the liver, lungs and bones admitted with increasing generalized weakness and lower extremity weakness. Bone scan was done yesterday it did not reveal any evidence of increase uptake in the spine or cord compression. So Decadron was stopped. Patient had a renal ultrasound this morning there was no evidence of hydronephrosis size of the kidneys were normal. Ammonia level and INR is unremarkable. He he had a bladder scan which showed only 99 mL of urine. He was he is being treated with IV fluids at 125 mL/h and a bolus of 500 mL.   denies any nausea vomiting or diarrhea.     Assessme shent & Plan:   Principal Problem:   SCC (spinal cord compression) (HCC) Active Problems:   Chronic combined systolic and diastolic CHF, NYHA class 1 (HCC)   Paroxysmal atrial fibrillation (HCC)   Dehydration   ARF (acute renal failure) (HCC)   Goals of care, counseling/discussion   Liver metastases (HCC)   Severe protein-calorie malnutrition (HCC)  Acute on chronic renal failure the etiology at this point is thought to be hypotension, intravascular volume depletion, dehydration secondary to diuretics and decreased by mouth intake. Her to his kidney functions has not improved in spite of IV hydration. Dr. Tessa Lerner following the patient. Will follow-up renal functions again tomorrow. . His urine sodium was less than 10.   Metastatic colon cancer chemotherapy being held at this time.    hypertension patient's blood pressure has been normal in spite of holding his blood pressure medications and give him IV hydration.  Diastolic CHF stable at this time but continue to monitor due to IV  hydration and holding of diuretics.  Transaminitis due to liver metastases.     .    DV T prophylaxis:  Code Status: Family Communication Disposition Plan:    Consultants: nephrology  Procedures:   Antimicrobials: none  Subjective: Feeling tired  Objective: Vitals:   02/12/17 1409 02/12/17 2131 02/13/17 0436 02/13/17 1353  BP: 120/60 129/60 119/76 136/62  Pulse: 72 66 71 (!) 106  Resp: 17 18 20 18   Temp: 98 F (36.7 C) 97.6 F (36.4 C) 98.2 F (36.8 C) 97.6 F (36.4 C)  TempSrc: Oral Oral Oral Oral  SpO2: 97% 98% 98% 97%  Weight:      Height:        Intake/Output Summary (Last 24 hours) at 02/13/17 1607 Last data filed at 02/13/17 1400  Gross per 24 hour  Intake          1134.58 ml  Output              550 ml  Net           584.58 ml   Filed Weights   02/11/17 0901  Weight: 100.7 kg (222 lb)    Examination:  General exam: Appears calm and comfortable  Respiratory system: Clear to auscultation. Respiratory effort normal. Cardiovascular system: S1 & S2 heard, RRR. No JVD, murmurs, rubs, gallops or clicks. No pedal edema. Gastrointestinal system: Abdomen is nondistended, soft and nontender. No organomegaly or masses felt. Normal bowel sounds heard. Central nervous system: Alert and oriented. No focal neurological deficits. Extremities: Symmetric 5  x 5 power. Skin: No rashes, lesions or ulcers Psychiatry: Judgement and insight appear normal. Mood & affect appropriate.     Data Reviewed: I have personally reviewed following labs and imaging studies  CBC:  Recent Labs Lab 02/11/17 0940 02/12/17 0622  WBC 13.8* 12.8*  NEUTROABS 10.5*  --   HGB 9.5* 8.5*  HCT 28.1* 24.9*  MCV 67.1* 66.4*  PLT 148* 595   Basic Metabolic Panel:  Recent Labs Lab 02/11/17 0940 02/12/17 0622 02/13/17 0340 02/13/17 1300  NA 133* 136 136 131*  K 4.0 3.7 4.0 3.8  CL 104 107 107 101  CO2 18* 17* 17* 14*  GLUCOSE 98 189* 157* 234*  BUN 38* 47* 64* 68*    CREATININE 3.22* 3.61* 4.30* 4.54*  CALCIUM 9.3 8.5* 8.4* 8.2*   GFR: Estimated Creatinine Clearance: 16.8 mL/min (A) (by C-G formula based on SCr of 4.54 mg/dL (H)). Liver Function Tests:  Recent Labs Lab 02/11/17 0940 02/12/17 0622 02/13/17 1300  AST 230* 256* 256*  ALT 73* 78* 91*  ALKPHOS 446* 411* 493*  BILITOT 2.2* 1.5* 1.4*  PROT 6.2* 5.8* 6.2*  ALBUMIN 2.0* 1.8* 1.8*   No results for input(s): LIPASE, AMYLASE in the last 168 hours.  Recent Labs Lab 02/13/17 1254  AMMONIA 55*   Coagulation Profile:  Recent Labs Lab 02/13/17 1300  INR 1.94   Cardiac Enzymes:  Recent Labs Lab 02/11/17 1909 02/12/17 0622 02/12/17 1314 02/12/17 2012 02/13/17 0340  TROPONINI 0.04* 0.08* 0.07* 0.08* 0.09*   BNP (last 3 results) No results for input(s): PROBNP in the last 8760 hours. HbA1C: No results for input(s): HGBA1C in the last 72 hours. CBG:  Recent Labs Lab 02/12/17 1148 02/12/17 1722 02/13/17 0753 02/13/17 1216  GLUCAP 259* 172* 162* 231*   Lipid Profile: No results for input(s): CHOL, HDL, LDLCALC, TRIG, CHOLHDL, LDLDIRECT in the last 72 hours. Thyroid Function Tests: No results for input(s): TSH, T4TOTAL, FREET4, T3FREE, THYROIDAB in the last 72 hours. Anemia Panel: No results for input(s): VITAMINB12, FOLATE, FERRITIN, TIBC, IRON, RETICCTPCT in the last 72 hours. Sepsis Labs: No results for input(s): PROCALCITON, LATICACIDVEN in the last 168 hours.  No results found for this or any previous visit (from the past 240 hour(s)).       Radiology Studies: Nm Bone Scan Whole Body  Result Date: 02/12/2017 CLINICAL DATA:  Metastatic colon cancer, metastatic synchronous adenocarcinoma suspected or proven initial workup, history hypertension, type II diabetes mellitus, paroxysmal atrial fibrillation, cardiomyopathy, grade 3 chronic renal insufficiency creatinine 3.6 EXAM: NUCLEAR MEDICINE WHOLE BODY BONE SCAN TECHNIQUE: Whole body anterior and posterior  images were obtained approximately 3 hours after intravenous injection of radiopharmaceutical. RADIOPHARMACEUTICALS:  20.7 mCi Technetium-53m MDP IV COMPARISON:  None Radiographic correlation: CT chest abdomen pelvis 02/11/2017 FINDINGS: Mild abnormal increased tracer localization at the anterior costochondral junctions diffusely, typically metabolic. Symmetric uptake at the proximal humeri and proximal femora, likely metabolic or physiologic as well. No definite sites of abnormal osseous tracer accumulation are identified which are suspicious for osseous metastatic disease. Expected urinary tract and soft tissue distribution of tracer. IMPRESSION: No definite scintigraphic evidence of osseous metastatic disease. 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 Electronically Signed   By: Lavonia Dana M.D.   On: 02/12/2017 17:01   US Renal  Result Date: 02/13/2017 CLINICAL DATA:  Renal failure. EXAM: RENAL / URINARY TRACT ULTRASOUND COMPLETE COMPARISON:  CT 02/11/2017 FINDINGS: Right Kidney: Length: 11.9 cm. 1 cm  cyst lower pole. Echogenicity within normal limits. No mass or hydronephrosis visualized. Left Kidney: Length: 11.9 cm. 2.4 cm cyst midportion. Echogenicity within normal limits. No mass or hydronephrosis visualized. Bladder: Appears normal for degree of bladder distention. IMPRESSION: Normal renal size and echogenicity.  No hydronephrosis. Electronically Signed   By: Nelson Chimes M.D.   On: 02/13/2017 09:04        Scheduled Meds: . feeding supplement (ENSURE ENLIVE)  237 mL Oral BID BM  . heparin subcutaneous  5,000 Units Subcutaneous Q12H  . insulin aspart  0-9 Units Subcutaneous TID WC  . sodium chloride flush  10-40 mL Intracatheter Q12H   Continuous Infusions: . sodium chloride 125 mL/hr at 02/13/17 0817     LOS: 2 days       Georgette Shell, MD [facility]  If 7PM-7AM, please contact  night-coverage [email or web address] Password [facility] 02/13/2017, inicalHas been (he if he wants to do it as this is the least

## 2017-02-13 NOTE — Progress Notes (Signed)
Marland Kitchen   HEMATOLOGY/ONCOLOGY INPATIENT PROGRESS NOTE  Date of Service: 02/13/2017  Inpatient Attending: .Georgette Shell, MD   SUBJECTIVE  Ernest Lara was admitted with progressive failure to thrive and a follow-up home with difficulty getting up. He was noted to be dehydrated with acute on chronic failure and worsening liver function tests. He noted some lower extremity weakness that improved with hydration. Had a bone scan which showed no overt osseous metastatic disease data CT chest abdomen pelvis shows extensive metastatic disease in the lungs and liver.  I met the patient with his daughter at bedside and had a frank discussion regarding his worsening performance status and difficulty with maintaining by mouth nutrition. 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 Patient is having difficult time coming to grips with his overall prognosis. He is somewhat understandably saddened by the fact that adjuvant chemotherapy was not able to keep his cancer away.   OBJECTIVE:  Fatigued and cachectic appearing  PHYSICAL EXAMINATION: . Vitals:   02/12/17 0519 02/12/17 1409 02/12/17 2131 02/13/17 0436  BP: (!) 115/57 120/60 129/60 119/76  Pulse: 70 72 66 71  Resp: 17 17 18 20   Temp: 97.6 F (36.4 C) 98 F (36.7 C) 97.6 F (36.4 C) 98.2 F (36.8 C)  TempSrc: Oral Oral Oral Oral  SpO2: 97% 97% 98% 98%  Weight:      Height:       Filed Weights   02/11/17 0901  Weight: 222 lb (100.7 kg)   .Body mass index is 27.75 kg/m.  GENERAL: Alert but fatigued appearing. SKIN: skin color, texture, turgor are normal, no rashes or significant lesions EYES: normal, conjunctiva are pink and non-injected, sclera clear OROPHARYNX:no exudate, no erythema and lips, buccal mucosa, and tongue normal  NECK: supple, no JVD,  thyroid normal size, non-tender, without nodularity LYMPH:  no palpable lymphadenopathy in the cervical, axillary or inguinal LUNGS: clear to auscultation with normal respiratory effort HEART: regular rate & rhythm,  no murmurs and no lower extremity edema ABDOMEN: abdomen soft, mild TTP to palpation over RUQ, normoactive bowel sounds  Musculoskeletal: b/l trace pedal edema. PSYCH: alert & oriented x 3 with fluent speech  NEURO: no focal motor/sensory deficits.   MEDICAL HISTORY:  Past Medical History:  Diagnosis Date  . CKD (chronic kidney disease) stage 3, GFR 30-59 ml/min    kidney function concerns with chemo  . Complete heart block ([place])    s. 05/02/2015 s/p BSX U128 Valitude CRT-P Beckie Salts).  Marland Kitchen GIB (gastrointestinal bleeding)    a. 10/2015- Hgb 4.6-->8u PRBC's;  b. 10/2015 EGD: non bleeding H pylori + ulceration; c. 10/2015 Colonoscopy: invasive adenocarcinoma.  . History of cardiomyopathy    a. Nonischemic, possibly tachycardia mediated;  b. 04/2015 Echo:  EF 60-65%, no rwma, mild AI/Ernest, mod dil LA/RA, PASP 46mHg.  .Marland KitchenHypertension   . Hypertensive heart disease   . Paroxysmal atrial fibrillation (H[facility])    a. CHA2DS2VASc = 2-3 (Xarelto).  . Stage IIIB Colon Cancer    a. 11/2015 Colonscopy: invasive adenocarcinoma;  b. 11/2015 s/p lap L colectomy; c. Pathology: pT3, pN1c Mx, MSI stable.  . Type 2 diabetes mellitus ([place]     SURGICAL HISTORY: Past Surgical History:  Procedure Laterality Date  . CARDIAC CATHETERIZATION    . CARDIOVERSION  10/11/2011   Procedure: CARDIOVERSION;  Surgeon: DLeonie Man MD;  Location: [facility];  Service: Cardiovascular;  Laterality: N/A;  . COLONOSCOPY N/A 11/05/2015   Procedure: COLONOSCOPY;  Surgeon: Ronald Lobo, MD;  Location: [facility] ENDOSCOPY;  Service: Endoscopy;  Laterality: N/A;  . EP IMPLANTABLE DEVICE N/A 05/02/2015   Procedure: BiV Pacemaker Insertion CRT-P;  Surgeon: Evans Lance, MD;  Location: [place] CV LAB;  Service: Cardiovascular;   Laterality: N/A;  . ESOPHAGOGASTRODUODENOSCOPY N/A 11/05/2015   Procedure: ESOPHAGOGASTRODUODENOSCOPY (EGD);  Surgeon: Ronald Lobo, MD;  Location: [facility] ENDOSCOPY;  Service: Endoscopy;  Laterality: N/A;  . EYE SURGERY  2015   left cataract with lens implant  . IR FLUORO GUIDE PORT INSERTION RIGHT  02/03/2017  . IR US GUIDE VASC ACCESS RIGHT  02/03/2017  . LAPAROSCOPIC PARTIAL COLECTOMY N/A 11/10/2015   Procedure: LAPAROSCOPIC LEFT COLECTOMY LAPAROSCOPIC MOBILIZATION OF SPLENIC FLEXURE;  Surgeon: Greer Pickerel, MD;  Location: [facility];  Service: General;  Laterality: N/A;  . Nuclear stress  11/01/2011   Severe global hypokinesis,dilated ventricle  . PORTACATH PLACEMENT N/A 12/29/2015   Procedure: INSERTION PORT-A-CATH WITH ULTRASOUND GUIDANCE;  Surgeon: Greer Pickerel, MD;  Location: [facility];  Service: General;  Laterality: N/A;  . TEE WITHOUT CARDIOVERSION  10/10/2011   Procedure: TRANSESOPHAGEAL ECHOCARDIOGRAM (TEE);  Surgeon: Sanda Klein, MD;  Location: [facility] ENDOSCOPY;  Service: Cardiovascular;  Laterality: N/A;    SOCIAL HISTORY: Social History   Social History  . Marital status: Married    Spouse name: N/A  . Number of children: N/A  . Years of education: N/A   Occupational History  . Not on file.   Social History Main Topics  . Smoking status: Never Smoker  . Smokeless tobacco: Never Used  . Alcohol use No  . Drug use: No  . Sexual activity: Not Currently   Other Topics Concern  . Not on file   Social History Narrative  . No narrative on file    FAMILY HISTORY: Family History  Problem Relation Age of Onset  . Colon cancer Mother   . Kidney disease Father        ESRF/HD, deceased    ALLERGIES:  is allergic to amiodarone and allopurinol.  MEDICATIONS:  Scheduled Meds: . feeding supplement (ENSURE ENLIVE)  237 mL Oral BID BM  . heparin subcutaneous  5,000 Units Subcutaneous Q12H  . insulin aspart  0-9 Units Subcutaneous TID WC  . sodium chloride flush  10-40 mL  Intracatheter Q12H   Continuous Infusions: . sodium chloride 125 mL/hr at 02/13/17 0817   PRN Meds:.acetaminophen, bisacodyl, diphenhydrAMINE, morphine, polyethylene glycol, sodium chloride flush, sodium phosphate  REVIEW OF SYSTEMS:    10 Point review of Systems was done is negative except as noted above.   LABORATORY DATA:  I have reviewed the data as listed  . CBC Latest Ref Rng & Units 02/12/2017 02/11/2017 02/03/2017  WBC 4.0 - 10.5 K/uL 12.8(H) 13.8(H) 12.0(H)  Hemoglobin 13.0 - 17.0 g/dL 8.5(L) 9.5(L) 10.7(L)  Hematocrit 39.0 - 52.0 % 24.9(L) 28.1(L) 31.8(L)  Platelets 150 - 400 K/uL 162 148(L) 187    . CMP Latest Ref Rng & Units 02/13/2017 02/12/2017 02/11/2017  Glucose 65 - 99 mg/dL 157(H) 189(H) 98  BUN 6 - 20 mg/dL 64(H) 47(H) 38(H)  Creatinine 0.61 - 1.24 mg/dL 4.30(H) 3.61(H) 3.22(H)  Sodium 135 - 145 mmol/L 136 136 133(L)  Potassium 3.5 - 5.1 mmol/L 4.0 3.7 4.0  Chloride 101 - 111 mmol/L 107 107 104  CO2 22 - 32 mmol/L 17(L) 17(L) 18(L)  Calcium 8.9 - 10.3  mg/dL 8.4(L) 8.5(L) 9.3  Total Protein 6.5 - 8.1 g/dL - 5.8(L) 6.2(L)  Total Bilirubin 0.3 - 1.2 mg/dL - 1.5(H) 2.2(H)  Alkaline Phos 38 - 126 U/L - 411(H) 446(H)  AST 15 - 41 U/L - 256(H) 230(H)  ALT 17 - 63 U/L - 78(H) 73(H)     RADIOGRAPHIC STUDIES: I have personally reviewed the radiological images as listed and agreed with the findings in the report. Ct Abdomen Pelvis Wo Contrast  Result Date: 02/11/2017 CLINICAL DATA:  Abdominal distention.  Colon cancer. EXAM: CT CHEST, ABDOMEN AND PELVIS WITHOUT CONTRAST TECHNIQUE: Multidetector CT imaging of the chest, abdomen and pelvis was performed following the standard protocol without IV contrast. COMPARISON:  CT 01/22/2017.  Chest CT 11/08/2015 FINDINGS: CT CHEST FINDINGS Cardiovascular: Pacer wires noted in the right heart. Severe diffuse coronary artery calcifications. Scattered aortic calcifications. No aneurysm. Heart is normal size. Mediastinum/Nodes: Innumerable  bilateral small pulmonary nodules compatible with diffuse metastatic involvement of the lungs. Small right pleural effusion. Lungs/Pleura: No mediastinal, hilar, or axillary adenopathy. Musculoskeletal: Chest wall soft tissues are unremarkable. Degenerative changes in the thoracic spine. Diffuse sclerotic appearance of the bony structures without focal lytic lesion or destructive process. CT ABDOMEN PELVIS FINDINGS Hepatobiliary: Innumerable large masses throughout the liver, difficult to measure due to the noncontrast nature of the study. Findings are likely similar to recent abdominal CT. Gallbladder unremarkable. Pancreas: No focal abnormality or ductal dilatation. Spleen: Small low-density lesion in the posterior spleen on image 54 measures 13 mm. Cannot exclude metastasis. Adrenals/Urinary Tract: Small low-density lesions in the kidneys bilaterally, stable, likely small cysts. Punctate nonobstructing stone in the lower pole of the left kidney. Adrenal glands and urinary bladder unremarkable. Stomach/Bowel: Postoperative changes in the distal transverse colon near the splenic flexure. Colonic diverticulosis. No visible active diverticulitis. Stomach and small bowel decompressed, unremarkable. Vascular/Lymphatic: Aortic and iliac calcifications. No aneurysm or adenopathy. Reproductive: Prostate enlargement. Other: No free air. Free fluid adjacent to the liver and extending in the right paracolic gutter and right pelvis. Musculoskeletal: No focal lytic or destructive bone lesion. Diffuse degenerative changes in the lumbar spine. Diffuse sclerotic appearance of the bones. IMPRESSION: Innumerable/diffuse nodules throughout the lungs compatible with metastatic disease. Small right pleural effusion. Extensive coronary artery disease. Innumerable large hepatic metastases, similar to prior study. Small hypodensity in the spleen, cannot exclude metastasis. Left lower pole nephrolithiasis. Free fluid adjacent to the liver  and in the right abdomen/ pelvis. Diffuse sclerotic appearance of the bony structures without visible destructive process or lytic lesion. Cannot completely exclude diffuse metastatic involvement. Consider further evaluation with bone scan. Electronically Signed   By: Rolm Baptise M.D.   On: 02/11/2017 11:40   Ct Abdomen Pelvis Wo Contrast  Result Date: 01/22/2017 CLINICAL DATA:  Status post partial colectomy 11/09/2016 for distal transverse colonic adenocarcinoma. Status post chemotherapy completed January 2018. Restaging. EXAM: CT ABDOMEN AND PELVIS WITHOUT CONTRAST TECHNIQUE: Multidetector CT imaging of the abdomen and pelvis was performed following the standard protocol without IV contrast. COMPARISON:  11/08/2015 CT abdomen/ pelvis. FINDINGS: Lower chest: There are numerous (greater than 20) solid pulmonary nodules randomly distributed throughout both lung bases, largest 6 mm in the medial right lower lobe (series 4/image 8) and 6 mm in the anterior left lower lobe (series 4/ image 2), all new since 11/08/2015. Patchy punctate calcifications throughout the right lung base are stable and compatible with remote postinflammatory change. Coronary atherosclerosis. Pacer leads are seen in the right atrium, right ventricle and coronary sinus.  Hepatobiliary: Liver is newly enlarged by numerous (greater than 10) new hypodense confluent liver masses scattered throughout the liver. Representative liver masses include a 7.9 x 7.4 cm superior right liver lobe mass (series 3/ image 15) and a 7.7 x 5.3 cm posterior left liver lobe mass (series 3/image 18). Normal gallbladder with no radiopaque cholelithiasis. No biliary ductal dilatation. Pancreas: Normal, with no mass or duct dilation. Spleen: Normal size. No mass. Adrenals/Urinary Tract: Stable appearance of the adrenal glands without discrete adrenal nodules. No hydronephrosis. Nonobstructing 3 mm lower left renal stone. No additional renal stones. Simple 1.7 cm  lateral interpolar left renal cyst. Otherwise no contour deforming renal lesions. Normal bladder. Stomach/Bowel: Grossly normal stomach. Normal caliber small bowel with no small bowel wall thickening. Normal appendix. Interval partial transverse colectomy. No discrete mass or wall thickening at the left upper quadrant colonic anastomosis. Mild diverticulosis in the left colon, with no large bowel wall thickening or pericolonic fat stranding. Oral contrast reaches the rectum. Vascular/Lymphatic: Atherosclerotic nonaneurysmal abdominal aorta. No pathologically enlarged lymph nodes in the abdomen or pelvis. Reproductive: Stable moderate prostatomegaly. Other: No pneumoperitoneum, ascites or focal fluid collection. Musculoskeletal: No aggressive appearing focal osseous lesions. Marked thoracolumbar spondylosis. IMPRESSION: 1. Numerous new hypodense liver masses scattered throughout the liver, most compatible with bulky hepatic metastatic disease. 2. Numerous new solid pulmonary nodules throughout both lung bases, most compatible with pulmonary metastases. 3. No evidence of local tumor recurrence at the colonic anastomosis. No abdominopelvic adenopathy. 4. Additional findings include Aortic Atherosclerosis (ICD10-I70.0), moderate prostatomegaly, mild left colonic diverticulosis and nonobstructing left renal stone. These results will be called to the ordering clinician or representative by the Radiologist Assistant, and communication documented in the PACS or zVision Dashboard. Electronically Signed   By: Ilona Sorrel M.D.   On: 01/22/2017 14:09   Dg Chest 2 View  Result Date: 02/11/2017 CLINICAL DATA:  Weakness, shortness of Breath EXAM: CHEST  2 VIEW COMPARISON:  12/29/2015 FINDINGS: Right Port-A-Cath and left pacer remain in place, unchanged. Patchy bilateral airspace opacities are noted, new since prior study. Heart is upper limits normal in size. No effusions or acute bony abnormality. IMPRESSION: New patchy  bilateral airspace disease which could reflect edema or infection. Electronically Signed   By: Rolm Baptise M.D.   On: 02/11/2017 10:45   Ct Head Wo Contrast  Result Date: 02/11/2017 CLINICAL DATA:  Weakness, difficulty walking. EXAM: CT HEAD WITHOUT CONTRAST TECHNIQUE: Contiguous axial images were obtained from the base of the skull through the vertex without intravenous contrast. COMPARISON:  None. FINDINGS: Brain: No acute intracranial abnormality. Specifically, no hemorrhage, hydrocephalus, mass lesion, acute infarction, or significant intracranial injury. Vascular: No hyperdense vessel or unexpected calcification. Skull: No acute calvarial abnormality. Sinuses/Orbits: Mucosal thickening in the left maxillary sinus. No air-fluid levels. Mastoid air cells are clear. Orbital soft tissues unremarkable. Other: None IMPRESSION: No acute intracranial abnormality. Chronic left maxillary sinusitis. Electronically Signed   By: Rolm Baptise M.D.   On: 02/11/2017 10:23   Ct Chest Wo Contrast  Result Date: 02/11/2017 CLINICAL DATA:  Abdominal distention.  Colon cancer. EXAM: CT CHEST, ABDOMEN AND PELVIS WITHOUT CONTRAST TECHNIQUE: Multidetector CT imaging of the chest, abdomen and pelvis was performed following the standard protocol without IV contrast. COMPARISON:  CT 01/22/2017.  Chest CT 11/08/2015 FINDINGS: CT CHEST FINDINGS Cardiovascular: Pacer wires noted in the right heart. Severe diffuse coronary artery calcifications. Scattered aortic calcifications. No aneurysm. Heart is normal size. Mediastinum/Nodes: Innumerable bilateral small pulmonary nodules compatible with diffuse  metastatic involvement of the lungs. Small right pleural effusion. Lungs/Pleura: No mediastinal, hilar, or axillary adenopathy. Musculoskeletal: Chest wall soft tissues are unremarkable. Degenerative changes in the thoracic spine. Diffuse sclerotic appearance of the bony structures without focal lytic lesion or destructive process. CT  ABDOMEN PELVIS FINDINGS Hepatobiliary: Innumerable large masses throughout the liver, difficult to measure due to the noncontrast nature of the study. Findings are likely similar to recent abdominal CT. Gallbladder unremarkable. Pancreas: No focal abnormality or ductal dilatation. Spleen: Small low-density lesion in the posterior spleen on image 54 measures 13 mm. Cannot exclude metastasis. Adrenals/Urinary Tract: Small low-density lesions in the kidneys bilaterally, stable, likely small cysts. Punctate nonobstructing stone in the lower pole of the left kidney. Adrenal glands and urinary bladder unremarkable. Stomach/Bowel: Postoperative changes in the distal transverse colon near the splenic flexure. Colonic diverticulosis. No visible active diverticulitis. Stomach and small bowel decompressed, unremarkable. Vascular/Lymphatic: Aortic and iliac calcifications. No aneurysm or adenopathy. Reproductive: Prostate enlargement. Other: No free air. Free fluid adjacent to the liver and extending in the right paracolic gutter and right pelvis. Musculoskeletal: No focal lytic or destructive bone lesion. Diffuse degenerative changes in the lumbar spine. Diffuse sclerotic appearance of the bones. IMPRESSION: Innumerable/diffuse nodules throughout the lungs compatible with metastatic disease. Small right pleural effusion. Extensive coronary artery disease. Innumerable large hepatic metastases, similar to prior study. Small hypodensity in the spleen, cannot exclude metastasis. Left lower pole nephrolithiasis. Free fluid adjacent to the liver and in the right abdomen/ pelvis. Diffuse sclerotic appearance of the bony structures without visible destructive process or lytic lesion. Cannot completely exclude diffuse metastatic involvement. Consider further evaluation with bone scan. Electronically Signed   By: Rolm Baptise M.D.   On: 02/11/2017 11:40   Nm Bone Scan Whole Body  Result Date: 02/12/2017 CLINICAL DATA:  Metastatic  colon cancer, metastatic synchronous adenocarcinoma suspected or proven initial workup, history hypertension, type II diabetes mellitus, paroxysmal atrial fibrillation, cardiomyopathy, grade 3 chronic renal insufficiency creatinine 3.6 EXAM: NUCLEAR MEDICINE WHOLE BODY BONE SCAN TECHNIQUE: Whole body anterior and posterior images were obtained approximately 3 hours after intravenous injection of radiopharmaceutical. RADIOPHARMACEUTICALS:  20.7 mCi Technetium-70mMDP IV COMPARISON:  None Radiographic correlation: CT chest abdomen pelvis 02/11/2017 FINDINGS: Mild abnormal increased tracer localization at the anterior costochondral junctions diffusely, typically metabolic. Symmetric uptake at the proximal humeri and proximal femora, likely metabolic or physiologic as well. No definite sites of abnormal osseous tracer accumulation are identified which are suspicious for osseous metastatic disease. Expected urinary tract and soft tissue distribution of tracer. IMPRESSION: No definite scintigraphic evidence of osseous metastatic disease. 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 Electronically Signed   By: MLavonia DanaM.D.   On: 02/12/2017 17:01   UKoreaRenal  Result Date: 02/13/2017 CLINICAL DATA:  Renal failure. EXAM: RENAL / URINARY TRACT ULTRASOUND COMPLETE COMPARISON:  CT 02/11/2017 FINDINGS: Right Kidney: Length: 11.9 cm. 1 cm cyst lower pole. Echogenicity within normal limits. No mass or hydronephrosis visualized. Left Kidney: Length: 11.9 cm. 2.4 cm cyst midportion. Echogenicity within normal limits. No mass or hydronephrosis visualized. Bladder: Appears normal for degree of bladder distention. IMPRESSION: Normal renal size and echogenicity.  No hydronephrosis. Electronically Signed   By: MNelson ChimesM.D.   On: 02/13/2017 09:04   Ir UKoreaGuide Vasc Access Right  Result Date: 02/03/2017 INDICATION: History of metastatic colon cancer. In need of durable  intravenous access for chemotherapy administration. EXAM: IMPLANTED PORT A CATH PLACEMENT  WITH ULTRASOUND AND FLUOROSCOPIC GUIDANCE COMPARISON:  Chest CT - 11/08/2015 MEDICATIONS: Ancef 2 gm IV; The antibiotic was administered within an appropriate time interval prior to skin puncture. ANESTHESIA/SEDATION: Moderate (conscious) sedation was employed during this procedure. A total of Versed 2 mg and Fentanyl 100 mcg was administered intravenously. Moderate Sedation Time: 25 minutes. The patient's level of consciousness and vital signs were monitored continuously by radiology nursing throughout the procedure under my direct supervision. CONTRAST:  None FLUOROSCOPY TIME:  18 seconds (7 mGy) COMPLICATIONS: None immediate. PROCEDURE: The procedure, risks, benefits, and alternatives were explained to the patient. Questions regarding the procedure were encouraged and answered. The patient understands and consents to the procedure. The right neck and chest were prepped with chlorhexidine in a sterile fashion, and a sterile drape was applied covering the operative field. Maximum barrier sterile technique with sterile gowns and gloves were used for the procedure. A timeout was performed prior to the initiation of the procedure. Local anesthesia was provided with 1% lidocaine with epinephrine. After creating a small venotomy incision, a micropuncture kit was utilized to access the internal jugular vein. Real-time ultrasound guidance was utilized for vascular access including the acquisition of a permanent ultrasound image documenting patency of the accessed vessel. The microwire was utilized to measure appropriate catheter length. A subcutaneous port pocket was then created along the upper chest wall utilizing a combination of sharp and blunt dissection. The pocket was irrigated with sterile saline. A single lumen ISP power injectable port was chosen for placement. The 8 Fr catheter was tunneled from the port pocket site to the  venotomy incision. The port was placed in the pocket. The external catheter was trimmed to appropriate length. At the venotomy, an 8 Fr peel-away sheath was placed over a guidewire under fluoroscopic guidance. The catheter was then placed through the sheath and the sheath was removed. Final catheter positioning was confirmed and documented with a fluoroscopic spot radiograph. The port was accessed with a Huber needle, aspirated and flushed with heparinized saline. The venotomy site was closed with an interrupted 4-0 Vicryl suture. The port pocket incision was closed with interrupted 2-0 Vicryl suture and the skin was opposed with a running subcuticular 4-0 Vicryl suture. Dermabond and Steri-strips were applied to both incisions. Dressings were placed. The patient tolerated the procedure well without immediate post procedural complication. FINDINGS: After catheter placement, the tip lies within the superior cavoatrial junction. The catheter aspirates and flushes normally and is ready for immediate use. IMPRESSION: Successful placement of a right internal jugular approach power injectable Port-A-Cath. The catheter is ready for immediate use. Electronically Signed   By: Sandi Mariscal M.D.   On: 02/03/2017 13:16   Ir Fluoro Guide Port Insertion Right  Result Date: 02/03/2017 INDICATION: History of metastatic colon cancer. In need of durable intravenous access for chemotherapy administration. EXAM: IMPLANTED PORT A CATH PLACEMENT WITH ULTRASOUND AND FLUOROSCOPIC GUIDANCE COMPARISON:  Chest CT - 11/08/2015 MEDICATIONS: Ancef 2 gm IV; The antibiotic was administered within an appropriate time interval prior to skin puncture. ANESTHESIA/SEDATION: Moderate (conscious) sedation was employed during this procedure. A total of Versed 2 mg and Fentanyl 100 mcg was administered intravenously. Moderate Sedation Time: 25 minutes. The patient's level of consciousness and vital signs were monitored continuously by radiology nursing  throughout the procedure under my direct supervision. CONTRAST:  None FLUOROSCOPY TIME:  18 seconds (7 mGy) COMPLICATIONS: None immediate. PROCEDURE: The procedure, risks, benefits, and alternatives were explained to the patient. Questions regarding the procedure  were encouraged and answered. The patient understands and consents to the procedure. The right neck and chest were prepped with chlorhexidine in a sterile fashion, and a sterile drape was applied covering the operative field. Maximum barrier sterile technique with sterile gowns and gloves were used for the procedure. A timeout was performed prior to the initiation of the procedure. Local anesthesia was provided with 1% lidocaine with epinephrine. After creating a small venotomy incision, a micropuncture kit was utilized to access the internal jugular vein. Real-time ultrasound guidance was utilized for vascular access including the acquisition of a permanent ultrasound image documenting patency of the accessed vessel. The microwire was utilized to measure appropriate catheter length. A subcutaneous port pocket was then created along the upper chest wall utilizing a combination of sharp and blunt dissection. The pocket was irrigated with sterile saline. A single lumen ISP power injectable port was chosen for placement. The 8 Fr catheter was tunneled from the port pocket site to the venotomy incision. The port was placed in the pocket. The external catheter was trimmed to appropriate length. At the venotomy, an 8 Fr peel-away sheath was placed over a guidewire under fluoroscopic guidance. The catheter was then placed through the sheath and the sheath was removed. Final catheter positioning was confirmed and documented with a fluoroscopic spot radiograph. The port was accessed with a Huber needle, aspirated and flushed with heparinized saline. The venotomy site was closed with an interrupted 4-0 Vicryl suture. The port pocket incision was closed with  interrupted 2-0 Vicryl suture and the skin was opposed with a running subcuticular 4-0 Vicryl suture. Dermabond and Steri-strips were applied to both incisions. Dressings were placed. The patient tolerated the procedure well without immediate post procedural complication. FINDINGS: After catheter placement, the tip lies within the superior cavoatrial junction. The catheter aspirates and flushes normally and is ready for immediate use. IMPRESSION: Successful placement of a right internal jugular approach power injectable Port-A-Cath. The catheter is ready for immediate use. Electronically Signed   By: Sandi Mariscal M.D.   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 . 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 Patient is s/p left sided colectomy on 11/10/2015 .  Patient is status post 6 cycles of FOLFOX Received 5-FU leucovorin for cycle 7-12 and tolerated this much better without significant thrombocytopenia or fatigue. 2) fatigue-from metastatic malignancy with weight loss. (24lbs weight loss over the last 3-4 months)  . Wt Readings from Last 3 Encounters:  02/11/17 222 lb (100.7 kg)  01/27/17 222 lb 3.2 oz (100.8 kg)  09/11/16 246 lb 12.8 oz (111.9 kg)   3) Abnormal LFTs -from liver metastases from colon cancer with significant worsening of liver function tests 4 ) Acute on chronic Renal failure -- ? Dehydration from poor by mouth intake versus hepatorenal syndrome. Creatinine has significantly worsened from a baseline of about 1.5-2 now up to around 4. 5) Failure to thrive  with significant worsening performance  status with an ECOG performance status of 3. 6) serum protein calorie malnutrition with a significant loss of weight and drop in albumin levels down to 1.8.   Plan -I had a frank discussion with the patient and his daughter regarding the fact that his colon cancer has been behaving quite aggressively and has been progressing rapidly. -At this point his performance status is declined significantly with significant worsening of his liver and kidney function which would make it difficult to consider palliative chemotherapy. -We discussed that given his current clinical situation palliative chemotherapy would not be a good approach since this would likely make him feel weaker. -Nutritional consultation. -Nephrology has been consulted for evaluation and management of his acute on chronic kidney disease. -Will need PT OT evaluation. -We discussed that if his kidney function and liver function stabilized and he is functioning better could revisit the option of palliative chemotherapy. -I discussed with the patient and her daughter that consideration of best supportive care through hospice would be a good option. Patient is having difficulties coming to grips with this prognosis and the rapidity of progress of his colon cancer and would likely need some time to make a decision. -No other oncologic intervention at this time. -We'll continue to follow as needed to help with goals of care discussion.  7) P. a fib on Xarelto per cardiology  -The setting of his acute renal failure with need to discontinue Xarelto . -Based on goals of care would need to discuss pros and cons of considering starting the patient on alternative anticoagulation vs not anticoagulating him.  I spent 30 minutes counseling the patient face to face. The total time spent in the appointment was 40 minutes and more than 50% was on counseling and direct patient cares.    Sullivan Lone MD [place] AAHIVMS  [facility] Hematology/Oncology Physician [facility]  (Office):       [phone] (Work cell):  [phone] (Fax):           [phone]

## 2017-02-13 NOTE — Progress Notes (Signed)
[facility] Progress Note  Subjective: creat up 4.3 today, marginal UOP 250 cc.  UNa low < 10.   Vitals:   02/12/17 0519 02/12/17 1409 02/12/17 2131 02/13/17 0436  BP: (!) 115/57 120/60 129/60 119/76  Pulse: 70 72 66 71  Resp: 17 17 18 20   Temp: 97.6 F (36.4 C) 98 F (36.7 C) 97.6 F (36.4 C) 98.2 F (36.8 C)  TempSrc: Oral Oral Oral Oral  SpO2: 97% 97% 98% 98%  Weight:      Height:        Inpatient medications: . feeding supplement (ENSURE ENLIVE)  237 mL Oral BID BM  . heparin subcutaneous  5,000 Units Subcutaneous Q12H  . insulin aspart  0-9 Units Subcutaneous TID WC  . sodium chloride flush  10-40 mL Intracatheter Q12H   . sodium chloride 125 mL/hr at 02/13/17 0817   acetaminophen, bisacodyl, diphenhydrAMINE, morphine, polyethylene glycol, sodium chloride flush, sodium phosphate  Exam: Gen alert, no distress No jvd or bruits Chest clear bilat to bases RRR no MRG Abd soft ntnd no mass or ascites +bs, no hsm Ext 1+ bilat ankle edema R > L  No wounds or ulcers Neuro is alert, Ox 3 , nf  Home meds :  - coreg 12.5 bid/ lasix 80 qd/ KCL/ doxazosin 8  - crestor / xarelto 20 qd  CXR 9/4 > new patchy infiltrates c/w edema or infection , vs other UA > 100 prot, 6-30 rbc, 0-5 wbc CT abd > kidneys normal , no hydro    Impression: 1.  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  Low UNa suggesting hypoperfusion. No hx CHF but would check.  Low alb/ low intravasc volume other possibility.  Hepatorenal less likely w/o signs of advance liver failure. Worsening renal function today.  May need CVP for volume management soon if not responding to [facility] IVF's.  Explained to pt that he is seriously ill.   2.  Metastatic stage IV colon Ca  3.  HTN - stopped bp meds, BP's better 4.  ^LFT's/ extensive liver mets 5.  Hx afib - on Xarelto 6.  DM on insulin 7.  Hx CHB - sp PPM. EKG w/ paced rhythm.   8.  Abnl CXR - extensive lung mets   Plan - check INR/ NH3,  ^IVF's, bladder scan/ renal US   Kelly Splinter MD [facility] pager [phone]   02/13/2017, 9:43 AM    Recent Labs Lab 02/11/17 0940 02/12/17 0622 02/13/17 0340  NA 133* 136 136  K 4.0 3.7 4.0  CL 104 107 107  CO2 18* 17* 17*  GLUCOSE 98 189* 157*  BUN 38* 47* 64*  CREATININE 3.22* 3.61* 4.30*  CALCIUM 9.3 8.5* 8.4*    Recent Labs Lab 02/11/17 0940 02/12/17 0622  AST 230* 256*  ALT 73* 78*  ALKPHOS 446* 411*  BILITOT 2.2* 1.5*  PROT 6.2* 5.8*  ALBUMIN 2.0* 1.8*    Recent Labs Lab 02/11/17 0940 02/12/17 0622  WBC 13.8* 12.8*  NEUTROABS 10.5*  --   HGB 9.5* 8.5*  HCT 28.1* 24.9*  MCV 67.1* 66.4*  PLT 148* 162   Iron/TIBC/Ferritin/ %Sat    Component Value Date/Time   IRON 24 (L) 11/22/2015 0952   TIBC 244 11/22/2015 0952   FERRITIN 100 01/24/2016 0830   IRONPCTSAT 10 (L) 11/22/2015 6967

## 2017-02-13 NOTE — Progress Notes (Signed)
  Echocardiogram 2D Echocardiogram has been performed.  Tresa Res 02/13/2017, 1:46 PM

## 2017-02-13 NOTE — Evaluation (Signed)
Physical Therapy Evaluation Patient Details Name: Ernest Lara MRN: [id number] DOB: 03/10/1941 Today's Date: 02/13/2017   History of Present Illness  76 year old male with metastatic colon cancer with metastases to the liver lungs and bones with chronic kidney disease admitted with lower extremity weakness, unable to ambulate.  Patient scheduled for bone scan today as he couldn't have an MRI due to pacemaker placement and a CT scan because with the acute on chronic kidney disease.  Bone scan: "No definite scintigraphic evidence of osseous metastatic disease."  Clinical Impression  Pt admitted with above diagnosis. Pt currently with functional limitations due to the deficits listed below (see PT Problem List). Pt will benefit from skilled PT to increase their independence and safety with mobility to allow discharge to the venue listed below.  Pt eager to ambulate.  Pt requiring UE support bilaterally and agreeable to use RW for mobility currently.  Pt presents with uncoordinated and unsteady gait, high fall risk.  Pt with difficulty grasping his decline in mobility stating he was walking miles and did not require assistive device prior to his current symptoms/admission.  Recommended pt not return home without assistance due to concern for falls as well as high likelihood of fatiguing quickly with daily tasks.  Discussed SNF for rehab however pt also reports uncertainty with his medical diagnosis and plan of care.  Pt would benefit from SNF upon d/c if he is agreeable.  Pt does agree to use RW for safety at this time.     Follow Up Recommendations SNF;Supervision/Assistance - 24 hour    Equipment Recommendations  Rolling walker with 5" wheels    Recommendations for Other Services       Precautions / Restrictions Precautions Precautions: Fall      Mobility  Bed Mobility               General bed mobility comments: pt up in recliner on arrival  Transfers Overall transfer level:  Needs assistance   Transfers: Sit to/from Stand Sit to Stand: Min assist         General transfer comment: assist to rise and steady, assist to control descent and safely land on recliner, verbal cues for hand placement to self assist  Ambulation/Gait Ambulation/Gait assistance: Min assist Ambulation Distance (Feet): 140 Feet Assistive device: None Gait Pattern/deviations: Step-through pattern;Decreased stride length;Decreased stance time - left Gait velocity: decreased   General Gait Details: pt hold IV pole with one hand and hand rail or reaching for support (1 HHA) for more support, declined RW today as he wished to see his abilities, agreeable to use RW in future due to unsteadiness and need for more support  Stairs            Wheelchair Mobility    Modified Rankin (Stroke Patients Only)       Balance Overall balance assessment: History of Falls;Needs assistance (reports only fall was just prior to arrival)         Standing balance support: Bilateral upper extremity supported Standing balance-Leahy Scale: Poor Standing balance comment: requires UE support                             Pertinent Vitals/Pain Pain Assessment: 0-10 Pain Score: 5  Pain Location: back Pain Descriptors / Indicators: Sore Pain Intervention(s): Monitored during session;Limited activity within patient's tolerance;Repositioned    Home Living Family/patient expects to be discharged to:: Private residence Living Arrangements: Alone Available Help at  Discharge:  (children live out of town) Type of Home: House Home Access: Level entry     Home Layout: One level [place]: [place] - single point      Prior Function Level of Independence: Independent         Comments: pt reports increased weakness and difficulty with balance approx a month prior to admission, started using SPC     Hand Dominance        Extremity/Trunk Assessment   Upper Extremity  Assessment Upper Extremity Assessment:  (reports neuropathy in bil fingers/hands)    Lower Extremity Assessment Lower Extremity Assessment: RLE deficits/detail;LLE deficits/detail (reports new neuropathy, ?chemo related) RLE Deficits / Details: grossly at least 4/5 throughout with MMT RLE Sensation: history of peripheral neuropathy RLE Coordination: decreased gross motor LLE Deficits / Details: grossly at least 4/5 throughout with MMT LLE Sensation: history of peripheral neuropathy LLE Coordination: decreased gross motor       Communication   Communication: No difficulties  Cognition Arousal/Alertness: Awake/alert Behavior During Therapy: WFL for tasks assessed/performed Overall Cognitive Status: Within Functional Limits for tasks assessed                                        General Comments      Exercises     Assessment/Plan    PT Assessment Patient needs continued PT services  PT Problem List Decreased strength;Decreased mobility;Decreased knowledge of use of DME;Decreased balance;Decreased activity tolerance       PT Treatment Interventions DME instruction;Gait training;Therapeutic activities;Therapeutic exercise;Functional mobility training;Patient/family education;Balance training    PT Goals (Current goals can be found in the Care Plan section)  Acute Rehab PT Goals PT Goal Formulation: With patient Time For Goal Achievement: 02/27/17 Potential to Achieve Goals: Good    Frequency Min 4X/week   Barriers to discharge        Co-evaluation               AM-PAC PT "6 Clicks" Daily Activity  Outcome Measure Difficulty turning over in bed (including adjusting bedclothes, sheets and blankets)?: A Little Difficulty moving from lying on back to sitting on the side of the bed? : A Lot Difficulty sitting down on and standing up from a chair with arms (e.g., wheelchair, bedside commode, etc,.)?: Unable Help needed moving to and from a bed to  chair (including a wheelchair)?: A Lot Help needed walking in hospital room?: A Little Help needed climbing 3-5 steps with a railing? : A Lot 6 Click Score: 13    End of Session Equipment Utilized During Treatment: Gait belt Activity Tolerance: Patient limited by fatigue Patient left: in chair;with call bell/phone within reach;with chair alarm set Nurse Communication: Mobility status PT Visit Diagnosis: Difficulty in walking, not elsewhere classified (R26.2);Unsteadiness on feet (R26.81)    Time: 1355-1420 PT Time Calculation (min) (ACUTE ONLY): 25 min   Charges:   PT Evaluation $PT Eval Moderate Complexity: 1 Mod     PT G Codes:        Carmelia Bake, PT, DPT 02/13/2017 Pager: [phone]  York Ram E 02/13/2017, 3:49 PM

## 2017-02-14 ENCOUNTER — Inpatient Hospital Stay (HOSPITAL_COMMUNITY): Payer: Medicare Other

## 2017-02-14 DIAGNOSIS — R109 Unspecified abdominal pain: Secondary | ICD-10-CM

## 2017-02-14 DIAGNOSIS — R609 Edema, unspecified: Secondary | ICD-10-CM

## 2017-02-14 DIAGNOSIS — R531 Weakness: Secondary | ICD-10-CM

## 2017-02-14 DIAGNOSIS — E43 Unspecified severe protein-calorie malnutrition: Secondary | ICD-10-CM

## 2017-02-14 LAB — COMPREHENSIVE METABOLIC PANEL
ALT: 94 U/L — AB (ref 17–63)
AST: 231 U/L — ABNORMAL HIGH (ref 15–41)
Albumin: 1.8 g/dL — ABNORMAL LOW (ref 3.5–5.0)
Alkaline Phosphatase: 492 U/L — ABNORMAL HIGH (ref 38–126)
Anion gap: 12 (ref 5–15)
BUN: 70 mg/dL — ABNORMAL HIGH (ref 6–20)
CHLORIDE: 109 mmol/L (ref 101–111)
CO2: 15 mmol/L — ABNORMAL LOW (ref 22–32)
CREATININE: 4.66 mg/dL — AB (ref 0.61–1.24)
Calcium: 8.4 mg/dL — ABNORMAL LOW (ref 8.9–10.3)
GFR calc Af Amer: 13 mL/min — ABNORMAL LOW (ref >60)
GFR calc non Af Amer: 11 mL/min — ABNORMAL LOW (ref >60)
Glucose, Bld: 134 mg/dL — ABNORMAL HIGH (ref 65–99)
Potassium: 3.9 mmol/L (ref 3.5–5.1)
Sodium: 136 mmol/L (ref 135–145)
Total Bilirubin: 1.4 mg/dL — ABNORMAL HIGH (ref 0.3–1.2)
Total Protein: 5.8 g/dL — ABNORMAL LOW (ref 6.5–8.1)

## 2017-02-14 LAB — BASIC METABOLIC PANEL
Anion gap: 13 (ref 5–15)
BUN: 75 mg/dL — ABNORMAL HIGH (ref 6–20)
CO2: 16 mmol/L — AB (ref 22–32)
Calcium: 8.3 mg/dL — ABNORMAL LOW (ref 8.9–10.3)
Chloride: 107 mmol/L (ref 101–111)
Creatinine, Ser: 4.69 mg/dL — ABNORMAL HIGH (ref 0.61–1.24)
GFR, EST AFRICAN AMERICAN: 13 mL/min — AB (ref >60)
GFR, EST NON AFRICAN AMERICAN: 11 mL/min — AB (ref >60)
GLUCOSE: 115 mg/dL — AB (ref 65–99)
POTASSIUM: 3.9 mmol/L (ref 3.5–5.1)
Sodium: 136 mmol/L (ref 135–145)

## 2017-02-14 LAB — CBC WITH DIFFERENTIAL/PLATELET
BASOS PCT: 0 %
Basophils Absolute: 0 10*3/uL (ref 0.0–0.1)
EOS PCT: 1 %
Eosinophils Absolute: 0.2 10*3/uL (ref 0.0–0.7)
HCT: 24.7 % — ABNORMAL LOW (ref 39.0–52.0)
Hemoglobin: 8.5 g/dL — ABNORMAL LOW (ref 13.0–17.0)
LYMPHS ABS: 0.8 10*3/uL (ref 0.7–4.0)
LYMPHS PCT: 5 %
MCH: 23 pg — AB (ref 26.0–34.0)
MCHC: 34.4 g/dL (ref 30.0–36.0)
MCV: 66.8 fL — AB (ref 78.0–100.0)
MONO ABS: 1.5 10*3/uL — AB (ref 0.1–1.0)
Monocytes Relative: 9 %
NEUTROS ABS: 13.8 10*3/uL — AB (ref 1.7–7.7)
NEUTROS PCT: 85 %
PLATELETS: 115 10*3/uL — AB (ref 150–400)
RBC: 3.7 MIL/uL — ABNORMAL LOW (ref 4.22–5.81)
RDW: 19 % — AB (ref 11.5–15.5)
WBC: 16.3 10*3/uL — AB (ref 4.0–10.5)

## 2017-02-14 LAB — GLUCOSE, CAPILLARY
GLUCOSE-CAPILLARY: 126 mg/dL — AB (ref 65–99)
GLUCOSE-CAPILLARY: 111 mg/dL — AB (ref 65–99)
Glucose-Capillary: 129 mg/dL — ABNORMAL HIGH (ref 65–99)

## 2017-02-14 MED ORDER — SODIUM BICARBONATE 8.4 % IV SOLN
INTRAVENOUS | Status: DC
  Filled 2017-02-14: qty 150

## 2017-02-14 MED ORDER — SODIUM CHLORIDE 4 MEQ/ML IV SOLN
INTRAVENOUS | Status: DC
  Administered 2017-02-14 – 2017-02-15 (×2): via INTRAVENOUS
  Filled 2017-02-14 (×3): qty 9.71

## 2017-02-14 MED ORDER — ALBUMIN HUMAN 25 % IV SOLN
12.5000 g | Freq: Once | INTRAVENOUS | Status: AC
  Administered 2017-02-14: 12.5 g via INTRAVENOUS
  Filled 2017-02-14: qty 50

## 2017-02-14 MED ORDER — SODIUM BICARBONATE 650 MG PO TABS
650.0000 mg | ORAL_TABLET | Freq: Three times a day (TID) | ORAL | Status: DC
  Administered 2017-02-14 – 2017-02-17 (×10): 650 mg via ORAL
  Filled 2017-02-14 (×10): qty 1

## 2017-02-14 NOTE — Progress Notes (Signed)
*  PRELIMINARY RESULTS* Vascular Ultrasound Left upper extremity venous duplex has been completed.  Preliminary findings: the visualized veins of the left upper extremity appear negative for deep and superficial vein thrombosis.  Everrett Coombe 02/14/2017, 12:17 PM

## 2017-02-14 NOTE — Progress Notes (Signed)
I spoke by phone with the patient's daughter to review his bone scan which Dr. Lisbeth Renshaw has personally reviewed. There do not appear to be any lesions in the skeletal system to indicate metastatic disease. We discussed that although there are limitations of this test, and a contrasted CT or MRI would be preferred to better assess for cord compression, we are less suspicious of cord involement. I've also spoken with the patient's nurse, and he remains neurologically intact. The patient is currently having issues with acute on chronic renal failure as well has tranaminitis which is preventing him from being eligible to proceed with the chemotherapy that was planned to start next week. We will defer further discussion of his systemic options to his medical oncologist Dr. Irene Limbo. We also agree with discontinuing Dexamethasone, and following the patient clinically. We would be happy to revisit this discussion if he has new neurologic symptoms to suggest cord compression. Of note in discussing his case with MRI, the manufacturer of his pacemaker is due to proceed with software updates within the next 30 days, and after that has been completed, the rep from [place] has indicated to the MRI team, that it may be possible to perform an MRI at [facility] and set the pacemaker into sleep mode after this upgrade. I will contact Dr. Irene Limbo as well regarding this patient.      Carola Rhine, PAC

## 2017-02-14 NOTE — Care Management Important Message (Signed)
Important Message  Patient Details  Name: LADDIE MATH MRN: [id number] Date of Birth: 10-16-1940   Medicare Important Message Given:  Yes    Kerin Salen 02/14/2017, 11:33 AMImportant Message  Patient Details  Name: KERRIGAN GLENDENING MRN: [id number] Date of Birth: 05/27/1941   Medicare Important Message Given:  Yes    Kerin Salen 02/14/2017, 11:33 AM

## 2017-02-14 NOTE — Progress Notes (Signed)
Physical Therapy Treatment Patient Details Name: Ernest Lara MRN: [id number] DOB: July 23, 1940 Today's Date: 02/14/2017    History of Present Illness 76 year old male with metastatic colon cancer with metastases to the liver lungs and bones with chronic kidney disease admitted with lower extremity weakness, unable to ambulate.  Patient scheduled for bone scan today as he couldn't have an MRI due to pacemaker placement and a CT scan because with the acute on chronic kidney disease.  Bone scan: "No definite scintigraphic evidence of osseous metastatic disease."    PT Comments    Pt agreeable to use RW today.  Pt reports feeling fatigued from a bad night and not eating breakfast.  Son present during session.  Con't to recommend SNF as pt lives alone.   Follow Up Recommendations  SNF;Supervision/Assistance - 24 hour     Equipment Recommendations  Rolling walker with 5" wheels    Recommendations for Other Services       Precautions / Restrictions Precautions Precautions: Fall    Mobility  Bed Mobility               General bed mobility comments: sitting EOB upon arrival  Transfers Overall transfer level: Needs assistance Equipment used: Rolling walker (2 wheeled) Transfers: Sit to/from Stand Sit to Stand: Min guard         General transfer comment: min/guard to steady with cues for hand placement  Ambulation/Gait Ambulation/Gait assistance: Min assist Ambulation Distance (Feet): 150 Feet Assistive device: Rolling walker (2 wheeled) Gait Pattern/deviations: Step-through pattern;Decreased dorsiflexion - right;Decreased dorsiflexion - left Gait velocity: decreased   General Gait Details: Pt with reliance on RW to steady self.  Pt took 3 standing rest breaks and did not like the gait belt.   Stairs            Wheelchair Mobility    Modified Rankin (Stroke Patients Only)       Balance Overall balance assessment: Needs assistance;History of Falls          Standing balance support: Bilateral upper extremity supported Standing balance-Leahy Scale: Poor Standing balance comment: requires UE support                            Cognition Arousal/Alertness: Awake/alert Behavior During Therapy: Flat affect Overall Cognitive Status: Within Functional Limits for tasks assessed                                        Exercises      General Comments        Pertinent Vitals/Pain Pain Assessment: No/denies pain    Home Living                      Prior Function            PT Goals (current goals can now be found in the care plan section) Acute Rehab PT Goals PT Goal Formulation: With patient Time For Goal Achievement: 02/27/17 Potential to Achieve Goals: Good    Frequency    Min 4X/week      PT Plan Current plan remains appropriate    Co-evaluation              AM-PAC PT "6 Clicks" Daily Activity  Outcome Measure  Difficulty turning over in bed (including adjusting bedclothes, sheets and blankets)?: A Little Difficulty moving from  lying on back to sitting on the side of the bed? : A Lot Difficulty sitting down on and standing up from a chair with arms (e.g., wheelchair, bedside commode, etc,.)?: A Lot Help needed moving to and from a bed to chair (including a wheelchair)?: A Little Help needed walking in hospital room?: A Little Help needed climbing 3-5 steps with a railing? : A Lot 6 Click Score: 15    End of Session Equipment Utilized During Treatment: Gait belt Activity Tolerance: Patient limited by fatigue Patient left: in chair;with call bell/phone within reach;with nursing/sitter in room;with family/visitor present Nurse Communication: Mobility status PT Visit Diagnosis: Difficulty in walking, not elsewhere classified (R26.2);Unsteadiness on feet (R26.81)     Time: 5456-2563 PT Time Calculation (min) (ACUTE ONLY): 14 min  Charges:  $Gait Training: 8-22  mins                    G Codes:       Kellon Chalk L. Tamala Julian, [place] Pager [phone] 02/14/2017    Galen Manila 02/14/2017, 12:56 PM

## 2017-02-14 NOTE — Progress Notes (Signed)
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 Patient's creatinine Trending up since admission. His creatinine was already elevated at the time of admission. Patient has a history of possible CK D with his creatinine somewhere in the range of 1.5. Patient was taking high-dose Lasix at home along with decreased by mouth intake. This has been stopped since admission patient has been hydrated and is being hydrated. Currently he is on IV fluids half normal saline D5 half-normal saline with bicarbonate. He also received a dose of albumin. His albumin is 1.8 and he has moderate protein calorie malnutrition. His family is at the bedside today 1 daughter and 1 son I have answered all their questions.   Assessment & Plan:   Principal Problem:   SCC (spinal cord compression) (HCC) Active Problems:   Chronic combined systolic and diastolic CHF, NYHA class 1 (HCC)   Paroxysmal atrial fibrillation (HCC)   Dehydration   ARF (acute renal failure) (HCC)   Goals of care, counseling/discussion   Liver metastases (HCC)   Severe protein-calorie malnutrition ([place]) Acute renal failure thought to be prerenal versus ATN continue with IV hydration. Avoid any nephrotoxic drugs. Monitor closely for any respiratory distress. Patient has slight bilateral pitting edema which is slightly worse than yesterday probably secondary to low albumin. Doppler of the left upper extremity was done to rule out DVT because of edema in his left upper extremity. Patient had an IV IV infiltrated in that hand before. There was no evidence of DVT by ultrasound. Discussed with radiologist department possibly getting a study to rule out thrombus in his IVC which could be sending thrombus to his kidneys  causing acute renal failure. Patient cannot have an MR because of pacemaker. And he cannot have a CT scan because of renal failure. The thought was that it was very less likely he could have a clot in the IVC due to the recent CT scan that was done which did not reveal a have or have any indications of having IVC dilatation. patient wants to be full code. He wants to have dialysis if needed. Will follow-up renal functions tomorrow.  Leukocytosis probably related to recent Decadron use. There is no evidence of infection at this time.  Metastatic colon cancer chemotherapy on hold at this time. The last chemotherapy he had was in January 2018.       DVT prophylaxis Code Status:  Family Communication Disposition Plan:    Consultants:   Procedures:   Antimicrobials:    Subjective: Feels sleepy  Objective: Vitals:   02/13/17 1353 02/13/17 2219 02/14/17 0528 02/14/17 1400  BP: 136/62 113/81 (!) 105/53 112/62  Pulse: (!) 106 72 72 (!) 102  Resp: 18 16 18 16   Temp: 97.6 F (36.4 C) 97.9 F (36.6 C) 97.9 F (36.6 C) (!) 97.5 F (36.4 C)  TempSrc: Oral Oral Oral Oral  SpO2: 97% 100% 99% 97%  Weight:   111 kg (244 lb 11.4 oz)   Height:        Intake/Output Summary (Last 24 hours) at 02/14/17 1821 Last data filed at 02/14/17 1807  Gross per 24 hour  Intake          4359.17 ml  Output  950 ml  Net          3409.17 ml   Filed Weights   02/11/17 0901 02/14/17 0528  Weight: 100.7 kg (222 lb) 111 kg (244 lb 11.4 oz)    Examination:  General exam: Appears calm and comfortable  Respiratory system: Clear to auscultation. Respiratory effort normal. Cardiovascular system: S1 & S2 heard, RRR. No JVD, murmurs, rubs, gallops or clicks. No pedal edema. Gastrointestinal system: Abdomen is nondistended, soft and nontender. No organomegaly or masses felt. Normal bowel sounds heard. Central nervous system: Alert and oriented. No focal neurological deficits. Extremities:  Symmetric 5 x 5 power. Skin: No rashes, lesions or ulcers Psychiatry: Judgement and insight appear normal. Mood & affect appropriate.     Data Reviewed: I have personally reviewed following labs and imaging studies  CBC:  Recent Labs Lab 02/11/17 0940 02/12/17 0622 02/14/17 0506  WBC 13.8* 12.8* 16.3*  NEUTROABS 10.5*  --  13.8*  HGB 9.5* 8.5* 8.5*  HCT 28.1* 24.9* 24.7*  MCV 67.1* 66.4* 66.8*  PLT 148* 162 563*   Basic Metabolic Panel:  Recent Labs Lab 02/11/17 0940 02/12/17 0622 02/13/17 0340 02/13/17 1300 02/14/17 0506  NA 133* 136 136 131* 136  K 4.0 3.7 4.0 3.8 3.9  CL 104 107 107 101 109  CO2 18* 17* 17* 14* 15*  GLUCOSE 98 189* 157* 234* 134*  BUN 38* 47* 64* 68* 70*  CREATININE 3.22* 3.61* 4.30* 4.54* 4.66*  CALCIUM 9.3 8.5* 8.4* 8.2* 8.4*   GFR: Estimated Creatinine Clearance: 18.4 mL/min (A) (by C-G formula based on SCr of 4.66 mg/dL (H)). Liver Function Tests:  Recent Labs Lab 02/11/17 0940 02/12/17 0622 02/13/17 1300 02/14/17 0506  AST 230* 256* 256* 231*  ALT 73* 78* 91* 94*  ALKPHOS 446* 411* 493* 492*  BILITOT 2.2* 1.5* 1.4* 1.4*  PROT 6.2* 5.8* 6.2* 5.8*  ALBUMIN 2.0* 1.8* 1.8* 1.8*   No results for input(s): LIPASE, AMYLASE in the last 168 hours.  Recent Labs Lab 02/13/17 1254  AMMONIA 55*   Coagulation Profile:  Recent Labs Lab 02/13/17 1300  INR 1.94   Cardiac Enzymes:  Recent Labs Lab 02/11/17 1909 02/12/17 0622 02/12/17 1314 02/12/17 2012 02/13/17 0340  TROPONINI 0.04* 0.08* 0.07* 0.08* 0.09*   BNP (last 3 results) No results for input(s): PROBNP in the last 8760 hours. HbA1C: No results for input(s): HGBA1C in the last 72 hours. CBG:  Recent Labs Lab 02/13/17 1216 02/13/17 1659 02/14/17 0646 02/14/17 1151 02/14/17 1704  GLUCAP 231* 198* 126* 129* 111*   Lipid Profile: No results for input(s): CHOL, HDL, LDLCALC, TRIG, CHOLHDL, LDLDIRECT in the last 72 hours. Thyroid Function Tests: No results for  input(s): TSH, T4TOTAL, FREET4, T3FREE, THYROIDAB in the last 72 hours. Anemia Panel: No results for input(s): VITAMINB12, FOLATE, FERRITIN, TIBC, IRON, RETICCTPCT in the last 72 hours. Sepsis Labs: No results for input(s): PROCALCITON, LATICACIDVEN in the last 168 hours.  No results found for this or any previous visit (from the past 240 hour(s)).       Radiology Studies: US Renal  Result Date: 02/13/2017 CLINICAL DATA:  Renal failure. EXAM: RENAL / URINARY TRACT ULTRASOUND COMPLETE COMPARISON:  CT 02/11/2017 FINDINGS: Right Kidney: Length: 11.9 cm. 1 cm cyst lower pole. Echogenicity within normal limits. No mass or hydronephrosis visualized. Left Kidney: Length: 11.9 cm. 2.4 cm cyst midportion. Echogenicity within normal limits. No mass or hydronephrosis visualized. Bladder: Appears normal for degree of bladder distention. IMPRESSION: Normal renal size and  echogenicity.  No hydronephrosis. Electronically Signed   By: Nelson Chimes M.D.   On: 02/13/2017 09:04        Scheduled Meds: . feeding supplement (ENSURE ENLIVE)  237 mL Oral BID BM  . heparin subcutaneous  5,000 Units Subcutaneous Q12H  . insulin aspart  0-9 Units Subcutaneous TID WC  . sodium bicarbonate  650 mg Oral TID  . sodium chloride flush  10-40 mL Intracatheter Q12H   Continuous Infusions: .  sodium bicarbonate infusion 1/4 NS 1000 mL 75 mL/hr at 02/14/17 1234     LOS: 3 days        Georgette Shell, MD [facility]   If 7PM-7AM, please contact night-coverage [email or web address] Password [facility] 02/14/2017, 6:21 PM

## 2017-02-14 NOTE — Progress Notes (Addendum)
[facility] ROUNDING NOTE   Subjective:   Interval History: comfortable patient with no complaints  Metastatic colon cancer presented with worsening renal failure  Baseline creatinine was 1.6   Presenting creatinine was in 3's and now up to 4.6   He has had some haemodynamic instability and has had fairly low blood pressures running in the 939 systolic range. He had a renal ultrasound that was unremarkable and urinalysis that was bland and appeared to have a prerenal picture on urine electrolyte evaluation    Objective:  Vital signs in last 24 hours:  Temp:  [97.6 F (36.4 C)-97.9 F (36.6 C)] 97.9 F (36.6 C) (09/07 0528) Pulse Rate:  [72-106] 72 (09/07 0528) Resp:  [16-18] 18 (09/07 0528) BP: (105-136)/(53-81) 105/53 (09/07 0528) SpO2:  [97 %-100 %] 99 % (09/07 0528) Weight:  [244 lb 11.4 oz (111 kg)] 244 lb 11.4 oz (111 kg) (09/07 0528)  Weight change:  Filed Weights   02/11/17 0901 02/14/17 0528  Weight: 222 lb (100.7 kg) 244 lb 11.4 oz (111 kg)    Intake/Output: I/O last 3 completed shifts: In: 3204.6 [P.O.:480; I.V.:2724.6] Out: 1225 [Urine:1225]   Intake/Output this shift:  No intake/output data recorded.  CVS- RRR RS- CTA no rales  ABD- BS present soft non-distended EXT- 1 +   Basic Metabolic Panel:  Recent Labs Lab 02/11/17 0940 02/12/17 0622 02/13/17 0340 02/13/17 1300 02/14/17 0506  NA 133* 136 136 131* 136  K 4.0 3.7 4.0 3.8 3.9  CL 104 107 107 101 109  CO2 18* 17* 17* 14* 15*  GLUCOSE 98 189* 157* 234* 134*  BUN 38* 47* 64* 68* 70*  CREATININE 3.22* 3.61* 4.30* 4.54* 4.66*  CALCIUM 9.3 8.5* 8.4* 8.2* 8.4*    Liver Function Tests:  Recent Labs Lab 02/11/17 0940 02/12/17 0622 02/13/17 1300 02/14/17 0506  AST 230* 256* 256* 231*  ALT 73* 78* 91* 94*  ALKPHOS 446* 411* 493* 492*  BILITOT 2.2* 1.5* 1.4* 1.4*  PROT 6.2* 5.8* 6.2* 5.8*  ALBUMIN 2.0* 1.8* 1.8* 1.8*   No results for input(s): LIPASE, AMYLASE in the last 168  hours.  Recent Labs Lab 02/13/17 1254  AMMONIA 55*    CBC:  Recent Labs Lab 02/11/17 0940 02/12/17 0622 02/14/17 0506  WBC 13.8* 12.8* 16.3*  NEUTROABS 10.5*  --  13.8*  HGB 9.5* 8.5* 8.5*  HCT 28.1* 24.9* 24.7*  MCV 67.1* 66.4* 66.8*  PLT 148* 162 115*    Cardiac Enzymes:  Recent Labs Lab 02/11/17 1909 02/12/17 0622 02/12/17 1314 02/12/17 2012 02/13/17 0340  TROPONINI 0.04* 0.08* 0.07* 0.08* 0.09*    BNP: Invalid input(s): POCBNP  CBG:  Recent Labs Lab 02/12/17 1722 02/13/17 0753 02/13/17 1216 02/13/17 1659 02/14/17 0646  GLUCAP 172* 162* 231* 198* 126*    Microbiology: Results for orders placed or performed in visit on 05/01/16  TECHNOLOGIST REVIEW     Status: None   Collection Time: 05/01/16  9:46 AM  Result Value Ref Range Status   Technologist Review occassional teardrops, ovalos, shistocytes  Final    Coagulation Studies:  Recent Labs  02/13/17 1300  LABPROT 22.0*  INR 1.94    Urinalysis:  Recent Labs  02/11/17 1510  COLORURINE AMBER*  LABSPEC 1.013  PHURINE 5.0  GLUCOSEU NEGATIVE  HGBUR MODERATE*  BILIRUBINUR NEGATIVE  KETONESUR NEGATIVE  PROTEINUR 100*  NITRITE NEGATIVE  LEUKOCYTESUR NEGATIVE      Imaging: Nm Bone Scan Whole Body  Result Date: 02/12/2017 CLINICAL DATA:  Metastatic colon cancer, metastatic synchronous adenocarcinoma suspected or proven initial workup, history hypertension, type II diabetes mellitus, paroxysmal atrial fibrillation, cardiomyopathy, grade 3 chronic renal insufficiency creatinine 3.6 EXAM: NUCLEAR MEDICINE WHOLE BODY BONE SCAN TECHNIQUE: Whole body anterior and posterior images were obtained approximately 3 hours after intravenous injection of radiopharmaceutical. RADIOPHARMACEUTICALS:  20.7 mCi Technetium-28m MDP IV COMPARISON:  None Radiographic correlation: CT chest abdomen pelvis 02/11/2017 FINDINGS: Mild abnormal increased tracer localization at the anterior costochondral junctions  diffusely, typically metabolic. Symmetric uptake at the proximal humeri and proximal femora, likely metabolic or physiologic as well. No definite sites of abnormal osseous tracer accumulation are identified which are suspicious for osseous metastatic disease. Expected urinary tract and soft tissue distribution of tracer. IMPRESSION: No definite scintigraphic evidence of osseous metastatic disease. 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 Electronically Signed   By: Lavonia Dana M.D.   On: 02/12/2017 17:01   US Renal  Result Date: 02/13/2017 CLINICAL DATA:  Renal failure. EXAM: RENAL / URINARY TRACT ULTRASOUND COMPLETE COMPARISON:  CT 02/11/2017 FINDINGS: Right Kidney: Length: 11.9 cm. 1 cm cyst lower pole. Echogenicity within normal limits. No mass or hydronephrosis visualized. Left Kidney: Length: 11.9 cm. 2.4 cm cyst midportion. Echogenicity within normal limits. No mass or hydronephrosis visualized. Bladder: Appears normal for degree of bladder distention. IMPRESSION: Normal renal size and echogenicity.  No hydronephrosis. Electronically Signed   By: Nelson Chimes M.D.   On: 02/13/2017 09:04     Medications:   . sodium chloride 125 mL/hr at 02/14/17 0419   . feeding supplement (ENSURE ENLIVE)  237 mL Oral BID BM  . heparin subcutaneous  5,000 Units Subcutaneous Q12H  . insulin aspart  0-9 Units Subcutaneous TID WC  . sodium chloride flush  10-40 mL Intracatheter Q12H   acetaminophen, bisacodyl, diphenhydrAMINE, morphine, polyethylene glycol, sodium chloride flush, sodium phosphate  Assessment/ Plan:  1. 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 Low UNa suggesting hypoperfusion. No hx CHF but would check.  Low alb/ low intravasc volume other possibility.  Hepatorenal less likely Lara/o signs of advance liver failure. Worsening renal function today.     Would evaluate the renal venous system -- discussed with Dr Zigmund Daniel who will talk to  radiology today to assess the best test. There is no ascites and abdomen is not tense so hepatorenal or abdominal compartment syndrome not thought to be likely.  2.  Metastatic stage IV colon Ca  3. HTN - stopped bp meds, BP's better 4.  ^LFT's/ extensive liver mets 5. Hx afib - on Xarelto 6. DM on insulin 7. Hx CHB - sp PPM. EKG Lara/ paced rhythm.  8.  Abnl CXR - extensive lung mets 9. Metabolic acidosis would start oral bicarbonate for now    LOS: 3 Ernest Lara @TODAY @9 :40 AM

## 2017-02-15 LAB — COMPREHENSIVE METABOLIC PANEL
ALK PHOS: 430 U/L — AB (ref 38–126)
ALT: 110 U/L — AB (ref 17–63)
AST: 267 U/L — AB (ref 15–41)
Albumin: 1.9 g/dL — ABNORMAL LOW (ref 3.5–5.0)
Anion gap: 12 (ref 5–15)
BUN: 76 mg/dL — AB (ref 6–20)
CALCIUM: 8.3 mg/dL — AB (ref 8.9–10.3)
CHLORIDE: 110 mmol/L (ref 101–111)
CO2: 16 mmol/L — AB (ref 22–32)
CREATININE: 4.58 mg/dL — AB (ref 0.61–1.24)
GFR calc non Af Amer: 11 mL/min — ABNORMAL LOW (ref >60)
GFR, EST AFRICAN AMERICAN: 13 mL/min — AB (ref >60)
GLUCOSE: 97 mg/dL (ref 65–99)
Potassium: 4 mmol/L (ref 3.5–5.1)
SODIUM: 138 mmol/L (ref 135–145)
Total Bilirubin: 1.9 mg/dL — ABNORMAL HIGH (ref 0.3–1.2)
Total Protein: 5.7 g/dL — ABNORMAL LOW (ref 6.5–8.1)

## 2017-02-15 LAB — CBC WITH DIFFERENTIAL/PLATELET
Basophils Absolute: 0 10*3/uL (ref 0.0–0.1)
Basophils Relative: 0 %
EOS ABS: 0.6 10*3/uL (ref 0.0–0.7)
EOS PCT: 4 %
HCT: 25.2 % — ABNORMAL LOW (ref 39.0–52.0)
Hemoglobin: 8.6 g/dL — ABNORMAL LOW (ref 13.0–17.0)
LYMPHS ABS: 0.8 10*3/uL (ref 0.7–4.0)
LYMPHS PCT: 5 %
MCH: 22.8 pg — AB (ref 26.0–34.0)
MCHC: 34.1 g/dL (ref 30.0–36.0)
MCV: 66.8 fL — ABNORMAL LOW (ref 78.0–100.0)
MONO ABS: 1.3 10*3/uL — AB (ref 0.1–1.0)
MONOS PCT: 8 %
Neutro Abs: 14.1 10*3/uL — ABNORMAL HIGH (ref 1.7–7.7)
Neutrophils Relative %: 84 %
PLATELETS: 83 10*3/uL — AB (ref 150–400)
RBC: 3.77 MIL/uL — AB (ref 4.22–5.81)
RDW: 19.1 % — ABNORMAL HIGH (ref 11.5–15.5)
WBC: 16.7 10*3/uL — ABNORMAL HIGH (ref 4.0–10.5)

## 2017-02-15 LAB — PROTEIN / CREATININE RATIO, URINE
Creatinine, Urine: 67.59 mg/dL
Protein Creatinine Ratio: 1.54 mg/mg{Cre} — ABNORMAL HIGH (ref 0.00–0.15)
Total Protein, Urine: 104 mg/dL

## 2017-02-15 LAB — GLUCOSE, CAPILLARY
Glucose-Capillary: 80 mg/dL (ref 65–99)
Glucose-Capillary: 121 mg/dL — ABNORMAL HIGH (ref 65–99)
Glucose-Capillary: 118 mg/dL — ABNORMAL HIGH (ref 65–99)
Glucose-Capillary: 119 mg/dL — ABNORMAL HIGH (ref 65–99)

## 2017-02-15 MED ORDER — FUROSEMIDE 10 MG/ML IJ SOLN
80.0000 mg | Freq: Two times a day (BID) | INTRAMUSCULAR | Status: DC
  Administered 2017-02-15 (×2): 80 mg via INTRAVENOUS
  Filled 2017-02-15 (×3): qty 8

## 2017-02-15 NOTE — Progress Notes (Signed)
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 Patient's creatinine Trending up since admission. His creatinine was already elevated at the time of admission. Patient has a history of possible CK D with his creatinine somewhere in the range of 1.5. Patient was taking high-dose Lasix at home along with decreased by mouth intake. This has been stopped since admission patient has been hydrated and is being hydrated. Currently he is on IV fluids half normal saline D5 half-normal saline with bicarbonate. He also received a dose of albumin. His albumin is 1.8 and he has moderate protein calorie malnutrition. His family is at the bedside today 1 daughter and 1 son I have answered all their questions.   Assessment & Plan:   Principal Problem:   SCC (spinal cord compression) (HCC) Active Problems:   Chronic combined systolic and diastolic CHF, NYHA class 1 (HCC)   Paroxysmal atrial fibrillation (HCC)   Dehydration   ARF (acute renal failure) (HCC)   Goals of care, counseling/discussion   Liver metastases (HCC)   Severe protein-calorie malnutrition (HCC)  Acute renal failure:  prerenal versus ATN  continue with gentle IV hydration - had Doppler ultrasound for bilateral edema -was negative for DVT Avoid any nephrotoxic drugs Renal vein thrombosis unlikely Nephrology consulting   Leukocytosis:  poss steroid related no evidence of infection at this time.  Metastatic colon cancer: chemotherapy on hold at this time.  Last chemotherapy he had was in January 2018.       DVT prophylaxis Lovenox Code Status: Full code Family Communication Disposition Plan:    Consultants:  Radiation oncology, medical oncology Procedures:   Antimicrobials:     Subjective: Feels sleepy  Objective: Vitals:   02/14/17 0528 02/14/17 1400 02/14/17 2151 02/15/17 0700  BP: (!) 105/53 112/62 135/62 132/64  Pulse: 72 (!) 102 98 76  Resp: 18 16 16 18   Temp: 97.9 F (36.6 C) (!) 97.5 F (36.4 C) 97.7 F (36.5 C) 97.8 F (36.6 C)  TempSrc: Oral Oral Oral Oral  SpO2: 99% 97% 98% 97%  Weight: 111 kg (244 lb 11.4 oz)   111.3 kg (245 lb 6 oz)  Height:        Intake/Output Summary (Last 24 hours) at 02/15/17 1232 Last data filed at 02/15/17 1026  Gross per 24 hour  Intake          2880.42 ml  Output             1225 ml  Net          1655.42 ml   Filed Weights   02/11/17 0901 02/14/17 0528 02/15/17 0700  Weight: 100.7 kg (222 lb) 111 kg (244 lb 11.4 oz) 111.3 kg (245 lb 6 oz)    Examination:  General exam: Appears calm and comfortable  Respiratory system: Clear to auscultation. Respiratory effort normal. Cardiovascular system: S1 & S2 heard, RRR. No JVD, murmurs, rubs, gallops or clicks. No pedal edema. Gastrointestinal system: Abdomen is nondistended, soft and nontender. No organomegaly or masses felt. Normal bowel sounds heard. Central nervous system: Alert and oriented. No focal neurological deficits. Extremities: Symmetric 5 x 5 power. Skin: No rashes, lesions or ulcers Psychiatry: Judgement and insight appear normal. Mood & affect appropriate.     Data Reviewed: I have  personally reviewed following labs and imaging studies  CBC:  Recent Labs Lab 02/11/17 0940 02/12/17 0622 02/14/17 0506 02/15/17 0328  WBC 13.8* 12.8* 16.3* 16.7*  NEUTROABS 10.5*  --  13.8* 14.1*  HGB 9.5* 8.5* 8.5* 8.6*  HCT 28.1* 24.9* 24.7* 25.2*  MCV 67.1* 66.4* 66.8* 66.8*  PLT 148* 162 115* 83*   Basic Metabolic Panel:  Recent Labs Lab 02/13/17 0340 02/13/17 1300 02/14/17 0506 02/14/17 2107 02/15/17 0328  NA 136 131* 136 136 138  K 4.0 3.8 3.9 3.9 4.0  CL 107 101 109 107 110  CO2 17* 14* 15* 16* 16*  GLUCOSE 157* 234* 134* 115* 97   BUN 64* 68* 70* 75* 76*  CREATININE 4.30* 4.54* 4.66* 4.69* 4.58*  CALCIUM 8.4* 8.2* 8.4* 8.3* 8.3*   GFR: Estimated Creatinine Clearance: 18.8 mL/min (A) (by C-G formula based on SCr of 4.58 mg/dL (H)). Liver Function Tests:  Recent Labs Lab 02/11/17 0940 02/12/17 0622 02/13/17 1300 02/14/17 0506 02/15/17 0328  AST 230* 256* 256* 231* 267*  ALT 73* 78* 91* 94* 110*  ALKPHOS 446* 411* 493* 492* 430*  BILITOT 2.2* 1.5* 1.4* 1.4* 1.9*  PROT 6.2* 5.8* 6.2* 5.8* 5.7*  ALBUMIN 2.0* 1.8* 1.8* 1.8* 1.9*   No results for input(s): LIPASE, AMYLASE in the last 168 hours.  Recent Labs Lab 02/13/17 1254  AMMONIA 55*   Coagulation Profile:  Recent Labs Lab 02/13/17 1300  INR 1.94   Cardiac Enzymes:  Recent Labs Lab 02/11/17 1909 02/12/17 0622 02/12/17 1314 02/12/17 2012 02/13/17 0340  TROPONINI 0.04* 0.08* 0.07* 0.08* 0.09*   BNP (last 3 results) No results for input(s): PROBNP in the last 8760 hours. HbA1C: No results for input(s): HGBA1C in the last 72 hours. CBG:  Recent Labs Lab 02/13/17 1659 02/14/17 0646 02/14/17 1151 02/14/17 1704 02/15/17 0800  GLUCAP 198* 126* 129* 111* 80   Lipid Profile: No results for input(s): CHOL, HDL, LDLCALC, TRIG, CHOLHDL, LDLDIRECT in the last 72 hours. Thyroid Function Tests: No results for input(s): TSH, T4TOTAL, FREET4, T3FREE, THYROIDAB in the last 72 hours. Anemia Panel: No results for input(s): VITAMINB12, FOLATE, FERRITIN, TIBC, IRON, RETICCTPCT in the last 72 hours. Sepsis Labs: No results for input(s): PROCALCITON, LATICACIDVEN in the last 168 hours.  No results found for this or any previous visit (from the past 240 hour(s)).       Radiology Studies: No results found.      Scheduled Meds: . feeding supplement (ENSURE ENLIVE)  237 mL Oral BID BM  . furosemide  80 mg Intravenous Q12H  . insulin aspart  0-9 Units Subcutaneous TID WC  . sodium bicarbonate  650 mg Oral TID  . sodium chloride flush   10-40 mL Intracatheter Q12H   Continuous Infusions:    LOS: 4 days        OSEI-BONSU,Ernest Loseke, MD [phone] [facility]   If 7PM-7AM, please contact night-coverage [email or web address] Password [facility] 02/15/2017, 12:32 PM

## 2017-02-15 NOTE — Progress Notes (Signed)
Marland Kitchen   HEMATOLOGY/ONCOLOGY INPATIENT PROGRESS NOTE  Date of Service: 02/14/2017  Inpatient Attending: .Benito Mccreedy, MD   SUBJECTIVE  Ernest Lara was seen with his son at bedside. He notes he feels a little better with the IV fluids. Has developed increasing lower extremity swelling due to third spacing. His sitting out of bed in the recliner. Has started to try to ambulate with the walker. He is still having difficulty prescription to grips with his worsening clinical status and the status of his malignancy. His creatinine is still significantly elevated and hasn't significantly improved with IV fluids and his functions remain normal with significantly compromised nutritional status with an albumin of under 2. As per son's request I also had an additional discussion with the son outside the patient's room about the grave nature of the patient's prognosis and the strong recommendation to consider home hospice cares and the fact that his poor performance status and multiorgan limitations did not make him a good candidate for palliative chemotherapy.   OBJECTIVE:  Fatigued and cachectic appearing  PHYSICAL EXAMINATION: . Vitals:   02/14/17 0528 02/14/17 1400 02/14/17 2151 02/15/17 0700  BP: (!) 105/53 112/62 135/62 132/64  Pulse: 72 (!) 102 98 76  Resp: 18 16 16 18   Temp: 97.9 F (36.6 C) (!) 97.5 F (36.4 C) 97.7 F (36.5 C) 97.8 F (36.6 C)  TempSrc: Oral Oral Oral Oral  SpO2: 99% 97% 98% 97%  Weight: 244 lb 11.4 oz (111 kg)   245 lb 6 oz (111.3 kg)  Height:       Filed Weights   02/11/17 0901 02/14/17 0528 02/15/17 0700  Weight: 222 lb (100.7 kg) 244 lb 11.4 oz (111 kg) 245 lb 6 oz (111.3 kg)   .Body mass index is 30.67 kg/m.  GENERAL: Alert but fatigued appearing. SKIN: skin color, texture, turgor are normal, no rashes or significant lesions EYES: normal, conjunctiva are pink and non-injected, sclera clear OROPHARYNX:no exudate, no erythema and lips, buccal mucosa, and  tongue normal  NECK: supple, no JVD, thyroid normal size, non-tender, without nodularity LYMPH:  no palpable lymphadenopathy in the cervical, axillary or inguinal LUNGS: clear to auscultation with normal respiratory effort HEART: regular rate & rhythm,  no murmurs and no lower extremity edema ABDOMEN: abdomen soft, mild TTP to palpation over RUQ, normoactive bowel sounds  Musculoskeletal: b/l trace pedal edema. PSYCH: alert & oriented x 3 with fluent speech  NEURO: no focal motor/sensory deficits.   MEDICAL HISTORY:  Past Medical History:  Diagnosis Date  . CKD (chronic kidney disease) stage 3, GFR 30-59 ml/min    kidney function concerns with chemo  . Complete heart block ([place])    s. 05/02/2015 s/p BSX U128 Valitude CRT-P Beckie Salts).  Marland Kitchen GIB (gastrointestinal bleeding)    a. 10/2015- Hgb 4.6-->8u PRBC's;  b. 10/2015 EGD: non bleeding H pylori + ulceration; c. 10/2015 Colonoscopy: invasive adenocarcinoma.  . History of cardiomyopathy    a. Nonischemic, possibly tachycardia mediated;  b. 04/2015 Echo:  EF 60-65%, no rwma, mild AI/Ernest, mod dil LA/RA, PASP 65mHg.  .Marland KitchenHypertension   . Hypertensive heart disease   . Paroxysmal atrial fibrillation (HCC)    a. CHA2DS2VASc = 2-3 (Xarelto).  . Stage IIIB Colon Cancer    a. 11/2015 Colonscopy: invasive adenocarcinoma;  b. 11/2015 s/p lap L colectomy; c. Pathology: pT3, pN1c Mx, MSI stable.  . Type 2 diabetes mellitus ([place]     SURGICAL HISTORY: Past Surgical History:  Procedure Laterality Date  .  CARDIAC CATHETERIZATION    . CARDIOVERSION  10/11/2011   Procedure: CARDIOVERSION;  Surgeon: Leonie Man, MD;  Location: [place];  Service: Cardiovascular;  Laterality: N/A;  . COLONOSCOPY N/A 11/05/2015   Procedure: COLONOSCOPY;  Surgeon: Ronald Lobo, MD;  Location: [facility] ENDOSCOPY;  Service: Endoscopy;  Laterality: N/A;  . EP IMPLANTABLE DEVICE N/A 05/02/2015   Procedure: BiV Pacemaker Insertion CRT-P;  Surgeon: Evans Lance, MD;  Location: [place] CV LAB;  Service: Cardiovascular;  Laterality: N/A;  . ESOPHAGOGASTRODUODENOSCOPY N/A 11/05/2015   Procedure: ESOPHAGOGASTRODUODENOSCOPY (EGD);  Surgeon: Ronald Lobo, MD;  Location: [facility] ENDOSCOPY;  Service: Endoscopy;  Laterality: N/A;  . EYE SURGERY  2015   left cataract with lens implant  . IR FLUORO GUIDE PORT INSERTION RIGHT  02/03/2017  . IR US GUIDE VASC ACCESS RIGHT  02/03/2017  . LAPAROSCOPIC PARTIAL COLECTOMY N/A 11/10/2015   Procedure: LAPAROSCOPIC LEFT COLECTOMY LAPAROSCOPIC MOBILIZATION OF SPLENIC FLEXURE;  Surgeon: Greer Pickerel, MD;  Location: [place];  Service: General;  Laterality: N/A;  . Nuclear stress  11/01/2011   Severe global hypokinesis,dilated ventricle  . PORTACATH PLACEMENT N/A 12/29/2015   Procedure: INSERTION PORT-A-CATH WITH ULTRASOUND GUIDANCE;  Surgeon: Greer Pickerel, MD;  Location: [facility];  Service: General;  Laterality: N/A;  . TEE WITHOUT CARDIOVERSION  10/10/2011   Procedure: TRANSESOPHAGEAL ECHOCARDIOGRAM (TEE);  Surgeon: Sanda Klein, MD;  Location: [facility] ENDOSCOPY;  Service: Cardiovascular;  Laterality: N/A;    SOCIAL HISTORY: Social History   Social History  . Marital status: Married    Spouse name: N/A  . Number of children: N/A  . Years of education: N/A   Occupational History  . Not on file.   Social History Main Topics  . Smoking status: Never Smoker  . Smokeless tobacco: Never Used  . Alcohol use No  . Drug use: No  . Sexual activity: Not Currently   Other Topics Concern  . Not on file   Social History Narrative  . No narrative on file    FAMILY HISTORY: Family History  Problem Relation Age of Onset  . Colon cancer Mother   . Kidney disease Father        ESRF/HD, deceased    ALLERGIES:  is allergic to amiodarone and allopurinol.  MEDICATIONS:  Scheduled Meds: . feeding supplement (ENSURE ENLIVE)  237 mL Oral BID BM  . furosemide  80 mg Intravenous Q12H  . insulin aspart  0-9 Units Subcutaneous TID WC  . sodium  bicarbonate  650 mg Oral TID  . sodium chloride flush  10-40 mL Intracatheter Q12H   Continuous Infusions:  PRN Meds:.acetaminophen, bisacodyl, diphenhydrAMINE, morphine, polyethylene glycol, sodium chloride flush, sodium phosphate  REVIEW OF SYSTEMS:    10 Point review of Systems was done is negative except as noted above.   LABORATORY DATA:  I have reviewed the data as listed  . CBC Latest Ref Rng & Units 02/14/2017 02/12/2017  WBC 4.0 - 10.5 K/uL 16.3(H) 12.8(H)  Hemoglobin 13.0 - 17.0 g/dL 8.5(L) 8.5(L)  Hematocrit 39.0 - 52.0 % 24.7(L) 24.9(L)  Platelets 150 - 400 K/uL 115(L) 162    . CMP Latest Ref Rng & Units 02/14/2017 02/14/2017  Glucose 65 - 99 mg/dL 115(H) 134(H)  BUN 6 - 20 mg/dL 75(H) 70(H)  Creatinine 0.61 - 1.24 mg/dL 4.69(H) 4.66(H)  Sodium 135 - 145 mmol/L 136 136  Potassium 3.5 - 5.1 mmol/L 3.9 3.9  Chloride 101 - 111 mmol/L 107 109  CO2 22 - 32 mmol/L 16(L)  15(L)  Calcium 8.9 - 10.3 mg/dL 8.3(L) 8.4(L)  Total Protein 6.5 - 8.1 g/dL - 5.8(L)  Total Bilirubin 0.3 - 1.2 mg/dL - 1.4(H)  Alkaline Phos 38 - 126 U/L - 492(H)  AST 15 - 41 U/L - 231(H)  ALT 17 - 63 U/L - 94(H)     RADIOGRAPHIC STUDIES: I have personally reviewed the radiological images as listed and agreed with the findings in the report. Ct Abdomen Pelvis Wo Contrast  Result Date: 02/11/2017 CLINICAL DATA:  Abdominal distention.  Colon cancer. EXAM: CT CHEST, ABDOMEN AND PELVIS WITHOUT CONTRAST TECHNIQUE: Multidetector CT imaging of the chest, abdomen and pelvis was performed following the standard protocol without IV contrast. COMPARISON:  CT 01/22/2017.  Chest CT 11/08/2015 FINDINGS: CT CHEST FINDINGS Cardiovascular: Pacer wires noted in the right heart. Severe diffuse coronary artery calcifications. Scattered aortic calcifications. No aneurysm. Heart is normal size. Mediastinum/Nodes: Innumerable bilateral small pulmonary nodules compatible with diffuse metastatic involvement of the lungs. Small  right pleural effusion. Lungs/Pleura: No mediastinal, hilar, or axillary adenopathy. Musculoskeletal: Chest wall soft tissues are unremarkable. Degenerative changes in the thoracic spine. Diffuse sclerotic appearance of the bony structures without focal lytic lesion or destructive process. CT ABDOMEN PELVIS FINDINGS Hepatobiliary: Innumerable large masses throughout the liver, difficult to measure due to the noncontrast nature of the study. Findings are likely similar to recent abdominal CT. Gallbladder unremarkable. Pancreas: No focal abnormality or ductal dilatation. Spleen: Small low-density lesion in the posterior spleen on image 54 measures 13 mm. Cannot exclude metastasis. Adrenals/Urinary Tract: Small low-density lesions in the kidneys bilaterally, stable, likely small cysts. Punctate nonobstructing stone in the lower pole of the left kidney. Adrenal glands and urinary bladder unremarkable. Stomach/Bowel: Postoperative changes in the distal transverse colon near the splenic flexure. Colonic diverticulosis. No visible active diverticulitis. Stomach and small bowel decompressed, unremarkable. Vascular/Lymphatic: Aortic and iliac calcifications. No aneurysm or adenopathy. Reproductive: Prostate enlargement. Other: No free air. Free fluid adjacent to the liver and extending in the right paracolic gutter and right pelvis. Musculoskeletal: No focal lytic or destructive bone lesion. Diffuse degenerative changes in the lumbar spine. Diffuse sclerotic appearance of the bones. IMPRESSION: Innumerable/diffuse nodules throughout the lungs compatible with metastatic disease. Small right pleural effusion. Extensive coronary artery disease. Innumerable large hepatic metastases, similar to prior study. Small hypodensity in the spleen, cannot exclude metastasis. Left lower pole nephrolithiasis. Free fluid adjacent to the liver and in the right abdomen/ pelvis. Diffuse sclerotic appearance of the bony structures without  visible destructive process or lytic lesion. Cannot completely exclude diffuse metastatic involvement. Consider further evaluation with bone scan. Electronically Signed   By: Rolm Baptise M.D.   On: 02/11/2017 11:40   Ct Abdomen Pelvis Wo Contrast  Result Date: 01/22/2017 CLINICAL DATA:  Status post partial colectomy 11/09/2016 for distal transverse colonic adenocarcinoma. Status post chemotherapy completed January 2018. Restaging. EXAM: CT ABDOMEN AND PELVIS WITHOUT CONTRAST TECHNIQUE: Multidetector CT imaging of the abdomen and pelvis was performed following the standard protocol without IV contrast. COMPARISON:  11/08/2015 CT abdomen/ pelvis. FINDINGS: Lower chest: There are numerous (greater than 20) solid pulmonary nodules randomly distributed throughout both lung bases, largest 6 mm in the medial right lower lobe (series 4/image 8) and 6 mm in the anterior left lower lobe (series 4/ image 2), all new since 11/08/2015. Patchy punctate calcifications throughout the right lung base are stable and compatible with remote postinflammatory change. Coronary atherosclerosis. Pacer leads are seen in the right atrium, right ventricle and coronary sinus.  Hepatobiliary: Liver is newly enlarged by numerous (greater than 10) new hypodense confluent liver masses scattered throughout the liver. Representative liver masses include a 7.9 x 7.4 cm superior right liver lobe mass (series 3/ image 15) and a 7.7 x 5.3 cm posterior left liver lobe mass (series 3/image 18). Normal gallbladder with no radiopaque cholelithiasis. No biliary ductal dilatation. Pancreas: Normal, with no mass or duct dilation. Spleen: Normal size. No mass. Adrenals/Urinary Tract: Stable appearance of the adrenal glands without discrete adrenal nodules. No hydronephrosis. Nonobstructing 3 mm lower left renal stone. No additional renal stones. Simple 1.7 cm lateral interpolar left renal cyst. Otherwise no contour deforming renal lesions. Normal bladder.  Stomach/Bowel: Grossly normal stomach. Normal caliber small bowel with no small bowel wall thickening. Normal appendix. Interval partial transverse colectomy. No discrete mass or wall thickening at the left upper quadrant colonic anastomosis. Mild diverticulosis in the left colon, with no large bowel wall thickening or pericolonic fat stranding. Oral contrast reaches the rectum. Vascular/Lymphatic: Atherosclerotic nonaneurysmal abdominal aorta. No pathologically enlarged lymph nodes in the abdomen or pelvis. Reproductive: Stable moderate prostatomegaly. Other: No pneumoperitoneum, ascites or focal fluid collection. Musculoskeletal: No aggressive appearing focal osseous lesions. Marked thoracolumbar spondylosis. IMPRESSION: 1. Numerous new hypodense liver masses scattered throughout the liver, most compatible with bulky hepatic metastatic disease. 2. Numerous new solid pulmonary nodules throughout both lung bases, most compatible with pulmonary metastases. 3. No evidence of local tumor recurrence at the colonic anastomosis. No abdominopelvic adenopathy. 4. Additional findings include Aortic Atherosclerosis (ICD10-I70.0), moderate prostatomegaly, mild left colonic diverticulosis and nonobstructing left renal stone. These results will be called to the ordering clinician or representative by the Radiologist Assistant, and communication documented in the PACS or zVision Dashboard. Electronically Signed   By: Ilona Sorrel M.D.   On: 01/22/2017 14:09   Dg Chest 2 View  Result Date: 02/11/2017 CLINICAL DATA:  Weakness, shortness of Breath EXAM: CHEST  2 VIEW COMPARISON:  12/29/2015 FINDINGS: Right Port-A-Cath and left pacer remain in place, unchanged. Patchy bilateral airspace opacities are noted, new since prior study. Heart is upper limits normal in size. No effusions or acute bony abnormality. IMPRESSION: New patchy bilateral airspace disease which could reflect edema or infection. Electronically Signed   By: Rolm Baptise M.D.   On: 02/11/2017 10:45   Ct Head Wo Contrast  Result Date: 02/11/2017 CLINICAL DATA:  Weakness, difficulty walking. EXAM: CT HEAD WITHOUT CONTRAST TECHNIQUE: Contiguous axial images were obtained from the base of the skull through the vertex without intravenous contrast. COMPARISON:  None. FINDINGS: Brain: No acute intracranial abnormality. Specifically, no hemorrhage, hydrocephalus, mass lesion, acute infarction, or significant intracranial injury. Vascular: No hyperdense vessel or unexpected calcification. Skull: No acute calvarial abnormality. Sinuses/Orbits: Mucosal thickening in the left maxillary sinus. No air-fluid levels. Mastoid air cells are clear. Orbital soft tissues unremarkable. Other: None IMPRESSION: No acute intracranial abnormality. Chronic left maxillary sinusitis. Electronically Signed   By: Rolm Baptise M.D.   On: 02/11/2017 10:23   Ct Chest Wo Contrast  Result Date: 02/11/2017 CLINICAL DATA:  Abdominal distention.  Colon cancer. EXAM: CT CHEST, ABDOMEN AND PELVIS WITHOUT CONTRAST TECHNIQUE: Multidetector CT imaging of the chest, abdomen and pelvis was performed following the standard protocol without IV contrast. COMPARISON:  CT 01/22/2017.  Chest CT 11/08/2015 FINDINGS: CT CHEST FINDINGS Cardiovascular: Pacer wires noted in the right heart. Severe diffuse coronary artery calcifications. Scattered aortic calcifications. No aneurysm. Heart is normal size. Mediastinum/Nodes: Innumerable bilateral small pulmonary nodules compatible with diffuse  metastatic involvement of the lungs. Small right pleural effusion. Lungs/Pleura: No mediastinal, hilar, or axillary adenopathy. Musculoskeletal: Chest wall soft tissues are unremarkable. Degenerative changes in the thoracic spine. Diffuse sclerotic appearance of the bony structures without focal lytic lesion or destructive process. CT ABDOMEN PELVIS FINDINGS Hepatobiliary: Innumerable large masses throughout the liver, difficult to measure  due to the noncontrast nature of the study. Findings are likely similar to recent abdominal CT. Gallbladder unremarkable. Pancreas: No focal abnormality or ductal dilatation. Spleen: Small low-density lesion in the posterior spleen on image 54 measures 13 mm. Cannot exclude metastasis. Adrenals/Urinary Tract: Small low-density lesions in the kidneys bilaterally, stable, likely small cysts. Punctate nonobstructing stone in the lower pole of the left kidney. Adrenal glands and urinary bladder unremarkable. Stomach/Bowel: Postoperative changes in the distal transverse colon near the splenic flexure. Colonic diverticulosis. No visible active diverticulitis. Stomach and small bowel decompressed, unremarkable. Vascular/Lymphatic: Aortic and iliac calcifications. No aneurysm or adenopathy. Reproductive: Prostate enlargement. Other: No free air. Free fluid adjacent to the liver and extending in the right paracolic gutter and right pelvis. Musculoskeletal: No focal lytic or destructive bone lesion. Diffuse degenerative changes in the lumbar spine. Diffuse sclerotic appearance of the bones. IMPRESSION: Innumerable/diffuse nodules throughout the lungs compatible with metastatic disease. Small right pleural effusion. Extensive coronary artery disease. Innumerable large hepatic metastases, similar to prior study. Small hypodensity in the spleen, cannot exclude metastasis. Left lower pole nephrolithiasis. Free fluid adjacent to the liver and in the right abdomen/ pelvis. Diffuse sclerotic appearance of the bony structures without visible destructive process or lytic lesion. Cannot completely exclude diffuse metastatic involvement. Consider further evaluation with bone scan. Electronically Signed   By: Rolm Baptise M.D.   On: 02/11/2017 11:40   Nm Bone Scan Whole Body  Result Date: 02/12/2017 CLINICAL DATA:  Metastatic colon cancer, metastatic synchronous adenocarcinoma suspected or proven initial workup, history hypertension,  type II diabetes mellitus, paroxysmal atrial fibrillation, cardiomyopathy, grade 3 chronic renal insufficiency creatinine 3.6 EXAM: NUCLEAR MEDICINE WHOLE BODY BONE SCAN TECHNIQUE: Whole body anterior and posterior images were obtained approximately 3 hours after intravenous injection of radiopharmaceutical. RADIOPHARMACEUTICALS:  20.7 mCi Technetium-37mMDP IV COMPARISON:  None Radiographic correlation: CT chest abdomen pelvis 02/11/2017 FINDINGS: Mild abnormal increased tracer localization at the anterior costochondral junctions diffusely, typically metabolic. Symmetric uptake at the proximal humeri and proximal femora, likely metabolic or physiologic as well. No definite sites of abnormal osseous tracer accumulation are identified which are suspicious for osseous metastatic disease. Expected urinary tract and soft tissue distribution of tracer. IMPRESSION: No definite scintigraphic evidence of osseous metastatic disease. 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 Electronically Signed   By: MLavonia DanaM.D.   On: 02/12/2017 17:01   [place]al  Result Date: 02/13/2017 CLINICAL DATA:  Renal failure. EXAM: RENAL / URINARY TRACT ULTRASOUND COMPLETE COMPARISON:  CT 02/11/2017 FINDINGS: Right Kidney: Length: 11.9 cm. 1 cm cyst lower pole. Echogenicity within normal limits. No mass or hydronephrosis visualized. Left Kidney: Length: 11.9 cm. 2.4 cm cyst midportion. Echogenicity within normal limits. No mass or hydronephrosis visualized. Bladder: Appears normal for degree of bladder distention. IMPRESSION: Normal renal size and echogenicity.  No hydronephrosis. Electronically Signed   By: MNelson ChimesM.D.   On: 02/13/2017 09:04   Ir [place]Guide Vasc Access Right  Result Date: 02/03/2017 INDICATION: History of metastatic colon cancer. In need of durable intravenous access for chemotherapy administration. EXAM: IMPLANTED PORT A CATH PLACEMENT WITH  ULTRASOUND AND  FLUOROSCOPIC GUIDANCE COMPARISON:  Chest CT - 11/08/2015 MEDICATIONS: Ancef 2 gm IV; The antibiotic was administered within an appropriate time interval prior to skin puncture. ANESTHESIA/SEDATION: Moderate (conscious) sedation was employed during this procedure. A total of Versed 2 mg and Fentanyl 100 mcg was administered intravenously. Moderate Sedation Time: 25 minutes. The patient's level of consciousness and vital signs were monitored continuously by radiology nursing throughout the procedure under my direct supervision. CONTRAST:  None FLUOROSCOPY TIME:  18 seconds (7 mGy) COMPLICATIONS: None immediate. PROCEDURE: The procedure, risks, benefits, and alternatives were explained to the patient. Questions regarding the procedure were encouraged and answered. The patient understands and consents to the procedure. The right neck and chest were prepped with chlorhexidine in a sterile fashion, and a sterile drape was applied covering the operative field. Maximum barrier sterile technique with sterile gowns and gloves were used for the procedure. A timeout was performed prior to the initiation of the procedure. Local anesthesia was provided with 1% lidocaine with epinephrine. After creating a small venotomy incision, a micropuncture kit was utilized to access the internal jugular vein. Real-time ultrasound guidance was utilized for vascular access including the acquisition of a permanent ultrasound image documenting patency of the accessed vessel. The microwire was utilized to measure appropriate catheter length. A subcutaneous port pocket was then created along the upper chest wall utilizing a combination of sharp and blunt dissection. The pocket was irrigated with sterile saline. A single lumen ISP power injectable port was chosen for placement. The 8 Fr catheter was tunneled from the port pocket site to the venotomy incision. The port was placed in the pocket. The external catheter was trimmed to appropriate  length. At the venotomy, an 8 Fr peel-away sheath was placed over a guidewire under fluoroscopic guidance. The catheter was then placed through the sheath and the sheath was removed. Final catheter positioning was confirmed and documented with a fluoroscopic spot radiograph. The port was accessed with a Huber needle, aspirated and flushed with heparinized saline. The venotomy site was closed with an interrupted 4-0 Vicryl suture. The port pocket incision was closed with interrupted 2-0 Vicryl suture and the skin was opposed with a running subcuticular 4-0 Vicryl suture. Dermabond and Steri-strips were applied to both incisions. Dressings were placed. The patient tolerated the procedure well without immediate post procedural complication. FINDINGS: After catheter placement, the tip lies within the superior cavoatrial junction. The catheter aspirates and flushes normally and is ready for immediate use. IMPRESSION: Successful placement of a right internal jugular approach power injectable Port-A-Cath. The catheter is ready for immediate use. Electronically Signed   By: Sandi Mariscal M.D.   On: 02/03/2017 13:16   Ir Fluoro Guide Port Insertion Right  Result Date: 02/03/2017 INDICATION: History of metastatic colon cancer. In need of durable intravenous access for chemotherapy administration. EXAM: IMPLANTED PORT A CATH PLACEMENT WITH ULTRASOUND AND FLUOROSCOPIC GUIDANCE COMPARISON:  Chest CT - 11/08/2015 MEDICATIONS: Ancef 2 gm IV; The antibiotic was administered within an appropriate time interval prior to skin puncture. ANESTHESIA/SEDATION: Moderate (conscious) sedation was employed during this procedure. A total of Versed 2 mg and Fentanyl 100 mcg was administered intravenously. Moderate Sedation Time: 25 minutes. The patient's level of consciousness and vital signs were monitored continuously by radiology nursing throughout the procedure under my direct supervision. CONTRAST:  None FLUOROSCOPY TIME:  18 seconds (7  mGy) COMPLICATIONS: None immediate. PROCEDURE: The procedure, risks, benefits, and alternatives were explained to the patient. Questions regarding the procedure  were encouraged and answered. The patient understands and consents to the procedure. The right neck and chest were prepped with chlorhexidine in a sterile fashion, and a sterile drape was applied covering the operative field. Maximum barrier sterile technique with sterile gowns and gloves were used for the procedure. A timeout was performed prior to the initiation of the procedure. Local anesthesia was provided with 1% lidocaine with epinephrine. After creating a small venotomy incision, a micropuncture kit was utilized to access the internal jugular vein. Real-time ultrasound guidance was utilized for vascular access including the acquisition of a permanent ultrasound image documenting patency of the accessed vessel. The microwire was utilized to measure appropriate catheter length. A subcutaneous port pocket was then created along the upper chest wall utilizing a combination of sharp and blunt dissection. The pocket was irrigated with sterile saline. A single lumen ISP power injectable port was chosen for placement. The 8 Fr catheter was tunneled from the port pocket site to the venotomy incision. The port was placed in the pocket. The external catheter was trimmed to appropriate length. At the venotomy, an 8 Fr peel-away sheath was placed over a guidewire under fluoroscopic guidance. The catheter was then placed through the sheath and the sheath was removed. Final catheter positioning was confirmed and documented with a fluoroscopic spot radiograph. The port was accessed with a Huber needle, aspirated and flushed with heparinized saline. The venotomy site was closed with an interrupted 4-0 Vicryl suture. The port pocket incision was closed with interrupted 2-0 Vicryl suture and the skin was opposed with a running subcuticular 4-0 Vicryl suture. Dermabond  and Steri-strips were applied to both incisions. Dressings were placed. The patient tolerated the procedure well without immediate post procedural complication. FINDINGS: After catheter placement, the tip lies within the superior cavoatrial junction. The catheter aspirates and flushes normally and is ready for immediate use. IMPRESSION: Successful placement of a right internal jugular approach power injectable Port-A-Cath. The catheter is ready for immediate use. Electronically Signed   By: Sandi Mariscal M.D.   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 . 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 Patient is s/p left sided colectomy on 11/10/2015 .  Patient is status post 6 cycles of FOLFOX Received 5-FU leucovorin for cycle 7-12 and tolerated this much better without significant thrombocytopenia or fatigue. 2) fatigue-from metastatic malignancy with weight loss. (24lbs weight loss over the last 3-4 months)  . Wt Readings from Last 3 Encounters:  02/15/17 245 lb 6 oz (111.3 kg)  01/27/17 222 lb 3.2 oz (100.8 kg)  09/11/16 246 lb 12.8 oz (111.9 kg)   3) Abnormal LFTs -from liver metastases from colon cancer with significant worsening of liver function tests 4 ) Acute on chronic Renal failure -- ? Dehydration from poor by mouth intake versus hepatorenal syndrome. Creatinine has significantly worsened from a baseline of about 1.5-2 now up >4 with no significant improvement with IV fluids and increasing  third spacing of fluids. 5) Failure to thrive with significant worsening  performance status with an ECOG performance status of 3. 6) serum protein calorie malnutrition with a significant loss of weight and drop in albumin levels down to 1.8.   Plan -I had a frank discussion with the patient and his son regarding the fact that his colon cancer has been behaving quite aggressively and has been progressing rapidly. -At this point his performance status is declined significantly with significant worsening of his liver and kidney function which would make it difficult to consider palliative chemotherapy. -appreciate care from hospitalist team and nursing staff.  7) P. a fib on Xarelto per cardiology  -The setting of his acute renal failure with need to discontinue Xarelto . -Based on goals of care would need to discuss pros and cons of considering starting the patient on alternative anticoagulation vs not anticoagulating him.  I spent 20 minutes counseling the patient face to face. The total time spent in the appointment was 25 minutes and more than 50% was on counseling and direct patient cares.    Sullivan Lone MD [place] AAHIVMS [facility] Hematology/Oncology Physician [facility]  (Office):       [phone] (Work cell):  [phone] (Fax):           [phone]

## 2017-02-15 NOTE — Progress Notes (Signed)
[facility] ROUNDING NOTE   Subjective:   Interval History: comfortable patient with no complaints  Metastatic colon cancer presented with worsening renal failure  Baseline creatinine was 1.6   Presenting creatinine was in 3's and now up to 4.6   He has had some haemodynamic instability and has had fairly low blood pressures running in the 696 systolic range. He had a renal ultrasound that was unremarkable and urinalysis that was bland and appeared to have a prerenal picture on urine electrolyte evaluation. Doubt any evidence of renal vein thrombosis on scan and most likely appears to be related to Acute tubular necrosis. Fairly reasonable urine output but concerned that the patient is becoming a little volume overloaded with increase in lower extremity edema  Objective:  Vital signs in last 24 hours:  Temp:  [97.5 F (36.4 C)-97.8 F (36.6 C)] 97.8 F (36.6 C) (09/08 0700) Pulse Rate:  [76-102] 76 (09/08 0700) Resp:  [16-18] 18 (09/08 0700) BP: (112-135)/(62-64) 132/64 (09/08 0700) SpO2:  [97 %-98 %] 97 % (09/08 0700) Weight:  [245 lb 6 oz (111.3 kg)] 245 lb 6 oz (111.3 kg) (09/08 0700)  Weight change: 10.6 oz (0.3 kg) Filed Weights   02/11/17 0901 02/14/17 0528 02/15/17 0700  Weight: 222 lb (100.7 kg) 244 lb 11.4 oz (111 kg) 245 lb 6 oz (111.3 kg)    Intake/Output: I/O last 3 completed shifts: In: 5250.4 [P.O.:360; I.V.:4890.4] Out: 2000 [Urine:2000]   Intake/Output this shift:  No intake/output data recorded.  CVS- RRR RS- CTA ABD- BS present soft non-distended EXT- 2 +  edema   Basic Metabolic Panel:  Recent Labs Lab 02/13/17 0340 02/13/17 1300 02/14/17 0506 02/14/17 2107 02/15/17 0328  NA 136 131* 136 136 138  K 4.0 3.8 3.9 3.9 4.0  CL 107 101 109 107 110  CO2 17* 14* 15* 16* 16*  GLUCOSE 157* 234* 134* 115* 97  BUN 64* 68* 70* 75* 76*  CREATININE 4.30* 4.54* 4.66* 4.69* 4.58*  CALCIUM 8.4* 8.2* 8.4* 8.3* 8.3*    Liver Function  Tests:  Recent Labs Lab 02/11/17 0940 02/12/17 0622 02/13/17 1300 02/14/17 0506 02/15/17 0328  AST 230* 256* 256* 231* 267*  ALT 73* 78* 91* 94* 110*  ALKPHOS 446* 411* 493* 492* 430*  BILITOT 2.2* 1.5* 1.4* 1.4* 1.9*  PROT 6.2* 5.8* 6.2* 5.8* 5.7*  ALBUMIN 2.0* 1.8* 1.8* 1.8* 1.9*   No results for input(s): LIPASE, AMYLASE in the last 168 hours.  Recent Labs Lab 02/13/17 1254  AMMONIA 55*    CBC:  Recent Labs Lab 02/11/17 0940 02/12/17 0622 02/14/17 0506 02/15/17 0328  WBC 13.8* 12.8* 16.3* 16.7*  NEUTROABS 10.5*  --  13.8* 14.1*  HGB 9.5* 8.5* 8.5* 8.6*  HCT 28.1* 24.9* 24.7* 25.2*  MCV 67.1* 66.4* 66.8* 66.8*  PLT 148* 162 115* 83*    Cardiac Enzymes:  Recent Labs Lab 02/11/17 1909 02/12/17 0622 02/12/17 1314 02/12/17 2012 02/13/17 0340  TROPONINI 0.04* 0.08* 0.07* 0.08* 0.09*    BNP: Invalid input(s): POCBNP  CBG:  Recent Labs Lab 02/13/17 1659 02/14/17 0646 02/14/17 1151 02/14/17 1704 02/15/17 0800  GLUCAP 198* 126* 129* 111* 29    Microbiology: Results for orders placed or performed in visit on 05/01/16  TECHNOLOGIST REVIEW     Status: None   Collection Time: 05/01/16  9:46 AM  Result Value Ref Range Status   Technologist Review occassional teardrops, ovalos, shistocytes  Final    Coagulation Studies:  Recent Labs  02/13/17  1300  LABPROT 22.0*  INR 1.94    Urinalysis: No results for input(s): COLORURINE, LABSPEC, PHURINE, GLUCOSEU, HGBUR, BILIRUBINUR, KETONESUR, PROTEINUR, UROBILINOGEN, NITRITE, LEUKOCYTESUR in the last 72 hours.  Invalid input(s): APPERANCEUR    Imaging: No results found.   Medications:   .  sodium bicarbonate infusion 1/4 NS 1000 mL 75 mL/hr at 02/15/17 0155   . feeding supplement (ENSURE ENLIVE)  237 mL Oral BID BM  . insulin aspart  0-9 Units Subcutaneous TID WC  . sodium bicarbonate  650 mg Oral TID  . sodium chloride flush  10-40 mL Intracatheter Q12H   acetaminophen, bisacodyl,  diphenhydrAMINE, morphine, polyethylene glycol, sodium chloride flush, sodium phosphate  Assessment/ Plan:  1. 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 Low UNa suggesting hypoperfusion. No hx CHF but would check. Low alb/ low intravasc volume other possibility. Hepatorenal less likely w/o signs of advance liver failure. Worsening renal function today. Not likely to be renal vein thrombosis  There is no ascites and abdomen is not tense so hepatorenal or abdominal compartment syndrome not thought to be likely. This appear to be ATN  2. Metastatic stage IV colon Ca  3. HTN - better although appears to be developing increased edema. Stop IV fluids today and will start IV lasix 4. ^LFT's/ extensive liver mets 5. Hx afib - on Xarelto 6. DM on insulin 7. Hx CHB - sp PPM. EKG w/ paced rhythm.  8. Abnl CXR - extensive lung mets 9. Metabolic acidosis would continue oral bicarbonate for now      LOS: 4 Renata Gambino W @TODAY @9 :15 AM

## 2017-02-15 NOTE — Progress Notes (Signed)
Patient has diastolic chf which is stable.

## 2017-02-15 NOTE — Progress Notes (Signed)
Pt had 21 beats SVT. MD notified.

## 2017-02-16 ENCOUNTER — Encounter (HOSPITAL_COMMUNITY): Payer: Self-pay

## 2017-02-16 LAB — AMMONIA: Ammonia: 28 umol/L (ref 9–35)

## 2017-02-16 LAB — URINALYSIS, ROUTINE W REFLEX MICROSCOPIC
BILIRUBIN URINE: NEGATIVE
Glucose, UA: NEGATIVE mg/dL
Ketones, ur: NEGATIVE mg/dL
Leukocytes, UA: NEGATIVE
NITRITE: NEGATIVE
PH: 5 (ref 5.0–8.0)
Protein, ur: NEGATIVE mg/dL
SPECIFIC GRAVITY, URINE: 1.009 (ref 1.005–1.030)

## 2017-02-16 LAB — BASIC METABOLIC PANEL
Anion gap: 13 (ref 5–15)
BUN: 86 mg/dL — AB (ref 6–20)
CALCIUM: 8.2 mg/dL — AB (ref 8.9–10.3)
CO2: 17 mmol/L — AB (ref 22–32)
CREATININE: 4.92 mg/dL — AB (ref 0.61–1.24)
Chloride: 110 mmol/L (ref 101–111)
GFR calc non Af Amer: 10 mL/min — ABNORMAL LOW (ref >60)
GFR, EST AFRICAN AMERICAN: 12 mL/min — AB (ref >60)
GLUCOSE: 102 mg/dL — AB (ref 65–99)
Potassium: 4 mmol/L (ref 3.5–5.1)
Sodium: 140 mmol/L (ref 135–145)

## 2017-02-16 LAB — CBC
HCT: 25.6 % — ABNORMAL LOW (ref 39.0–52.0)
Hemoglobin: 8.8 g/dL — ABNORMAL LOW (ref 13.0–17.0)
MCH: 22.9 pg — AB (ref 26.0–34.0)
MCHC: 34.4 g/dL (ref 30.0–36.0)
MCV: 66.5 fL — ABNORMAL LOW (ref 78.0–100.0)
PLATELETS: 62 10*3/uL — AB (ref 150–400)
RBC: 3.85 MIL/uL — AB (ref 4.22–5.81)
RDW: 19.1 % — AB (ref 11.5–15.5)
WBC: 18.4 10*3/uL — ABNORMAL HIGH (ref 4.0–10.5)

## 2017-02-16 LAB — LACTIC ACID, PLASMA: LACTIC ACID, VENOUS: 2.8 mmol/L — AB (ref 0.5–1.9)

## 2017-02-16 LAB — GLUCOSE, CAPILLARY
GLUCOSE-CAPILLARY: 99 mg/dL (ref 65–99)
GLUCOSE-CAPILLARY: 135 mg/dL — AB (ref 65–99)
Glucose-Capillary: 109 mg/dL — ABNORMAL HIGH (ref 65–99)
Glucose-Capillary: 111 mg/dL — ABNORMAL HIGH (ref 65–99)

## 2017-02-16 MED ORDER — FUROSEMIDE 10 MG/ML IJ SOLN
40.0000 mg | Freq: Two times a day (BID) | INTRAMUSCULAR | Status: DC
  Administered 2017-02-16 – 2017-02-17 (×2): 40 mg via INTRAVENOUS
  Filled 2017-02-16: qty 4

## 2017-02-16 MED ORDER — SODIUM CHLORIDE 0.9 % IV BOLUS (SEPSIS)
250.0000 mL | Freq: Once | INTRAVENOUS | Status: AC
  Administered 2017-02-16: 250 mL via INTRAVENOUS

## 2017-02-16 MED ORDER — SODIUM CHLORIDE 0.9 % IV BOLUS (SEPSIS)
250.0000 mL | Freq: Once | INTRAVENOUS | Status: AC
  Administered 2017-02-17: 250 mL via INTRAVENOUS

## 2017-02-16 NOTE — Progress Notes (Signed)
Blood pressure now in the low 100s, patient still drowsy, IV lasix decreased to 40mg . Will continue to monitor patient.

## 2017-02-16 NOTE — Progress Notes (Signed)
PROGRESS NOTE    Ernest Lara  EPP:[id number] DOB: 21-Nov-1940 DOA: 02/11/2017 PCP: Jonathon Jordan, MD    Brief Narrative: 76 year old male with history of metastatic colon cancer with metastases to the liver and lungs and bones admitted with weakness cord compression was ruled out. Patient's creatinine Trending up since admission. His creatinine was already elevated at the time of admission. Patient has a history of possible CK D with his creatinine somewhere in the range of 1.5. Patient was taking high-dose Lasix at home along with decreased by mouth intake. This has been stopped since admission patient has been hydrated and is being hydrated. Currently he is on IV fluids half normal saline D5 half-normal saline with bicarbonate. He also received a dose of albumin. His albumin is 1.8 and he has moderate protein calorie malnutrition. His family is at the bedside today 1 daughter and 1 son I have answered all their questions.   Assessment & Plan:   Principal Problem:   SCC (spinal cord compression) (HCC) Active Problems:   Chronic combined systolic and diastolic CHF, NYHA class 1 (HCC)   Paroxysmal atrial fibrillation (HCC)   Dehydration   ARF (acute renal failure) (HCC)   Goals of care, counseling/discussion   Liver metastases (HCC)   Severe protein-calorie malnutrition (HCC)  Acute renal failure:  prerenal versus ATN  continue with gentle IV hydration - had Doppler ultrasound for bilateral edema -was negative for DVT Avoid any nephrotoxic drugs Renal vein thrombosis unlikely Nephrology consulting   Leukocytosis:  poss steroid related no evidence of infection at this time.  Metastatic colon cancer: chemotherapy on hold at this time.  Last chemotherapy he had was in January 2018.  Hypotension: Hold back on morphine for now. IV Bolus prn Cut back on lasix  Fatigue: May be related to low bp; no evidence of new infection or worsening anemia No  CP/SOB  Thrombocytopenia: Worsening Hem/onc following    DVT prophylaxis Lovenox Code Status: Full code Family Communication Disposition Plan:    Consultants:  Radiation oncology, medical oncology Procedures:   Antimicrobials:    Subjective: Episode of borderline hypotension this morning with complaints of fatigue - he had received morhine earlier  Objective: Vitals:   02/16/17 0412 02/16/17 0853 02/16/17 0857 02/16/17 0902  BP:  101/68 (!) 92/58 99/61  Pulse:      Resp:  (!) 8 10 13   Temp:      TempSrc:      SpO2:  97% 96% 97%  Weight: 112.1 kg (247 lb 2.2 oz)     Height:        Intake/Output Summary (Last 24 hours) at 02/16/17 0917 Last data filed at 02/16/17 0818  Gross per 24 hour  Intake              120 ml  Output              825 ml  Net             -705 ml   Filed Weights   02/14/17 0528 02/15/17 0700 02/16/17 0412  Weight: 111 kg (244 lb 11.4 oz) 111.3 kg (245 lb 6 oz) 112.1 kg (247 lb 2.2 oz)    Examination:  General exam: Appears calm and comfortable  Respiratory system: Clear to auscultation. Respiratory effort normal. Cardiovascular system: S1 & S2 heard, RRR. No JVD, murmurs, rubs, gallops or clicks. No pedal edema. Gastrointestinal system: Abdomen is nondistended, soft and nontender. No organomegaly or masses felt. Normal bowel sounds  heard. Central nervous system: Alert and oriented. No focal neurological deficits. Extremities: Symmetric 5 x 5 power. Skin: No rashes, lesions or ulcers Psychiatry: Judgement and insight appear normal. Mood & affect appropriate.     Data Reviewed: I have personally reviewed following labs and imaging studies  CBC:  Recent Labs Lab 02/11/17 0940 02/12/17 0622 02/14/17 0506 02/15/17 0328 02/16/17 0517  WBC 13.8* 12.8* 16.3* 16.7* 18.4*  NEUTROABS 10.5*  --  13.8* 14.1*  --   HGB 9.5* 8.5* 8.5* 8.6* 8.8*  HCT 28.1* 24.9* 24.7* 25.2* 25.6*  MCV 67.1* 66.4* 66.8* 66.8* 66.5*  PLT 148* 162 115* 83*  62*   Basic Metabolic Panel:  Recent Labs Lab 02/13/17 1300 02/14/17 0506 02/14/17 2107 02/15/17 0328 02/16/17 0517  NA 131* 136 136 138 140  K 3.8 3.9 3.9 4.0 4.0  CL 101 109 107 110 110  CO2 14* 15* 16* 16* 17*  GLUCOSE 234* 134* 115* 97 102*  BUN 68* 70* 75* 76* 86*  CREATININE 4.54* 4.66* 4.69* 4.58* 4.92*  CALCIUM 8.2* 8.4* 8.3* 8.3* 8.2*   GFR: Estimated Creatinine Clearance: 17.5 mL/min (A) (by C-G formula based on SCr of 4.92 mg/dL (H)). Liver Function Tests:  Recent Labs Lab 02/11/17 0940 02/12/17 0622 02/13/17 1300 02/14/17 0506 02/15/17 0328  AST 230* 256* 256* 231* 267*  ALT 73* 78* 91* 94* 110*  ALKPHOS 446* 411* 493* 492* 430*  BILITOT 2.2* 1.5* 1.4* 1.4* 1.9*  PROT 6.2* 5.8* 6.2* 5.8* 5.7*  ALBUMIN 2.0* 1.8* 1.8* 1.8* 1.9*   No results for input(s): LIPASE, AMYLASE in the last 168 hours.  Recent Labs Lab 02/13/17 1254  AMMONIA 55*   Coagulation Profile:  Recent Labs Lab 02/13/17 1300  INR 1.94   Cardiac Enzymes:  Recent Labs Lab 02/11/17 1909 02/12/17 0622 02/12/17 1314 02/12/17 2012 02/13/17 0340  TROPONINI 0.04* 0.08* 0.07* 0.08* 0.09*   BNP (last 3 results) No results for input(s): PROBNP in the last 8760 hours. HbA1C: No results for input(s): HGBA1C in the last 72 hours. CBG:  Recent Labs Lab 02/15/17 0800 02/15/17 1227 02/15/17 1735 02/15/17 2046 02/16/17 0751  GLUCAP 80 121* 118* 119* 99   Lipid Profile: No results for input(s): CHOL, HDL, LDLCALC, TRIG, CHOLHDL, LDLDIRECT in the last 72 hours. Thyroid Function Tests: No results for input(s): TSH, T4TOTAL, FREET4, T3FREE, THYROIDAB in the last 72 hours. Anemia Panel: No results for input(s): VITAMINB12, FOLATE, FERRITIN, TIBC, IRON, RETICCTPCT in the last 72 hours. Sepsis Labs: No results for input(s): PROCALCITON, LATICACIDVEN in the last 168 hours.  No results found for this or any previous visit (from the past 240 hour(s)).       Radiology  Studies: No results found.      Scheduled Meds: . feeding supplement (ENSURE ENLIVE)  237 mL Oral BID BM  . furosemide  80 mg Intravenous Q12H  . insulin aspart  0-9 Units Subcutaneous TID WC  . sodium bicarbonate  650 mg Oral TID  . sodium chloride flush  10-40 mL Intracatheter Q12H   Continuous Infusions:    LOS: 5 days        OSEI-BONSU,Chea Malan, MD [phone] [facility]   If 7PM-7AM, please contact night-coverage [email or web address] Password [email or web address] 02/16/2017, 9:17 AM

## 2017-02-16 NOTE — Progress Notes (Signed)
CRITICAL VALUE ALERT  Critical Value: Lactic 2.8  Date & Time Notied: 02/16/17 2320  Provider Notified: [facility] on call  Orders Received/Actions taken:

## 2017-02-16 NOTE — Progress Notes (Signed)
Patient's BP in the 80s, poor PO intake and urine output, still drowsy; Dr. Vista Lawman notified ordered to give 250 NS bolus that was stopped this morning now infusing and also  get UA. Unable to get oral/axillary temperature and patient refuses to check temp rectally. Patient/family informed of plan of care.

## 2017-02-16 NOTE — Progress Notes (Signed)
Patient drowsy, disoriented to date, BP in the 80s. Dr Vista Lawman paged and rapid response called, at the bedside assessing patient. Will continue to monitor patient.

## 2017-02-16 NOTE — Progress Notes (Signed)
[facility] ROUNDING [facility]   Subjective:   Interval History: comfortable patient with no complaints Metastatic colon cancer presented with worsening renal failure Baseline creatinine was 1.6 Presenting creatinine was in 3's and now up to 4.6 He has had some haemodynamic instability and has had fairly low blood pressures running in the 355 systolic range. He had a renal ultrasound that was unremarkable and urinalysis that was bland and appeared to have a prerenal picture on urine electrolyte evaluation. Doubt any evidence of renal vein thrombosis on scan and most likely appears to be related to Acute tubular necrosis. Fairly reasonable urine output but concerned that the patient is becoming a little volume overloaded with increase in lower extremity edema There appears to be some moderate response to diuretics although the weight continues to increase  The blood pressure also appears to be a little on the low side  Objective:  Vital signs in last 24 hours:  Temp:  [97.5 F (36.4 C)-97.7 F (36.5 C)] 97.5 F (36.4 C) (09/09 0409) Pulse Rate:  [66-107] 107 (09/09 0409) Resp:  [8-14] 13 (09/09 0902) BP: (92-122)/(55-68) 99/61 (09/09 0902) SpO2:  [95 %-98 %] 97 % (09/09 0902) Weight:  [247 lb 2.2 oz (112.1 kg)] 247 lb 2.2 oz (112.1 kg) (09/09 0412)  Weight change: 1 lb 12.2 oz (0.8 kg) Filed Weights   02/14/17 0528 02/15/17 0700 02/16/17 0412  Weight: 244 lb 11.4 oz (111 kg) 245 lb 6 oz (111.3 kg) 247 lb 2.2 oz (112.1 kg)    Intake/Output: I/O last 3 completed shifts: In: 1011.3 [P.O.:120; I.V.:891.3] Out: 1725 [Urine:1725]   Intake/Output this shift:  Total I/O In: -  Out: 150 [Urine:150]  CVS- RRR RS- CTA ABD- BS present soft non-distended EXT-  2 + edema   Basic Metabolic Panel:  Recent Labs Lab 02/13/17 1300 02/14/17 0506 02/14/17 2107 02/15/17 0328 02/16/17 0517  NA 131* 136 136 138 140  K 3.8 3.9 3.9 4.0 4.0  CL 101 109 107 110 110  CO2 14*  15* 16* 16* 17*  GLUCOSE 234* 134* 115* 97 102*  BUN 68* 70* 75* 76* 86*  CREATININE 4.54* 4.66* 4.69* 4.58* 4.92*  CALCIUM 8.2* 8.4* 8.3* 8.3* 8.2*    Liver Function Tests:  Recent Labs Lab 02/11/17 0940 02/12/17 0622 02/13/17 1300 02/14/17 0506 02/15/17 0328  AST 230* 256* 256* 231* 267*  ALT 73* 78* 91* 94* 110*  ALKPHOS 446* 411* 493* 492* 430*  BILITOT 2.2* 1.5* 1.4* 1.4* 1.9*  PROT 6.2* 5.8* 6.2* 5.8* 5.7*  ALBUMIN 2.0* 1.8* 1.8* 1.8* 1.9*   No results for input(s): LIPASE, AMYLASE in the last 168 hours.  Recent Labs Lab 02/13/17 1254  AMMONIA 55*    CBC:  Recent Labs Lab 02/11/17 0940 02/12/17 0622 02/14/17 0506 02/15/17 0328 02/16/17 0517  WBC 13.8* 12.8* 16.3* 16.7* 18.4*  NEUTROABS 10.5*  --  13.8* 14.1*  --   HGB 9.5* 8.5* 8.5* 8.6* 8.8*  HCT 28.1* 24.9* 24.7* 25.2* 25.6*  MCV 67.1* 66.4* 66.8* 66.8* 66.5*  PLT 148* 162 115* 83* 62*    Cardiac Enzymes:  Recent Labs Lab 02/11/17 1909 02/12/17 0622 02/12/17 1314 02/12/17 2012 02/13/17 0340  TROPONINI 0.04* 0.08* 0.07* 0.08* 0.09*    BNP: Invalid input(s): POCBNP  CBG:  Recent Labs Lab 02/15/17 0800 02/15/17 1227 02/15/17 1735 02/15/17 2046 02/16/17 0751  GLUCAP 80 121* 118* 119* 63    Microbiology: Results for orders placed or performed in visit on 05/01/16  TECHNOLOGIST  REVIEW     Status: None   Collection Time: 05/01/16  9:46 AM  Result Value Ref Range Status   Technologist Review occassional teardrops, ovalos, shistocytes  Final    Coagulation Studies:  Recent Labs  02/13/17 1300  LABPROT 22.0*  INR 1.94    Urinalysis: No results for input(s): COLORURINE, LABSPEC, PHURINE, GLUCOSEU, HGBUR, BILIRUBINUR, KETONESUR, PROTEINUR, UROBILINOGEN, NITRITE, LEUKOCYTESUR in the last 72 hours.  Invalid input(s): APPERANCEUR    Imaging: No results found.   Medications:    . feeding supplement (ENSURE ENLIVE)  237 mL Oral BID BM  . furosemide  40 mg Intravenous  Q12H  . insulin aspart  0-9 Units Subcutaneous TID WC  . sodium bicarbonate  650 mg Oral TID  . sodium chloride flush  10-40 mL Intracatheter Q12H   acetaminophen, bisacodyl, diphenhydrAMINE, morphine, polyethylene glycol, sodium chloride flush, sodium phosphate  Assessment/ Plan:  1. 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 Low UNa suggesting hypoperfusion. No hx CHF but would check. Low alb/ low intravasc volume other possibility. Hepatorenal less likely w/o signs of advance liver failure. Worsening renal function today. Not likely to be renal vein thrombosis  There is no ascites and abdomen is not tense so hepatorenal or abdominal compartment syndrome not thought to be likely. This appear to be ATN  2. Metastatic stage IV colon Ca  3. HTN - Now with decreased blood pressure and some increase in edema. The creatinine now is starting to rise and this is concerning. He would not be considered a dialysis candidate with his advanced cancer and low blood pressures . I think it would be reasonable to involve palliative medicine  4. ^LFT's/ extensive liver mets 5. Hx afib - on Xarelto 6. DM on insulin 7. Hx CHB - sp PPM. EKG w/ paced rhythm.  8. Abnl CXR - extensive lung mets 9. Metabolic acidosis would continue oral bicarbonate for now   LOS: 5 Richy Spradley W @TODAY @10 :31 AM

## 2017-02-16 NOTE — Significant Event (Signed)
Rapid Response Event Note  Overview: Time Called: 4854 Arrival Time: 0850 Event Type: Hypotension  Called by bedside RN in concern for patient in 1432 having hypotension and being lethargic and altered. Upon arrival, patient was sitting up in chair. Patient appeared to be sleeping but able to awaken when asking patient questions.   Initial Focused Assessment: Neuro: Patient able to answer questions appropriately and follow commands with ease, drowsy though, oriented x4 pupils equal and reactive bilaterally +2 in size. No pain a this time.  Cardiac: Rhythm is V-paced, OE70-35, occasional PVCs, heart sounds heard upon auscultation, 1-2+ pulses palpated, BP upper 90s-low 100s(see vitals). Pulmonary: Breath sounds clear and diminished in all lung fields bilaterally, RR 8-15, O2 saturations 95-97% RA Patient does have lower extremity edema bilaterally 2-3+ Interventions: MD originally ordered to give 250 bolus, stopped due to BP being upper 90s- to low 100s. MD ordered to stop bolus at bedside.  MD ordered to hold 80mg  lasix dose and also giving any morphine at this time, patient does have elevated creatinine.   Plan of Care (if not transferred): Continue to monitor patient for any changes in mental status, or responsiveness. Continue to monitor patient's BP per routine vitals. Patient's creatinine is 4.92 based on AM BMET, K is within normal range at this time, continue monitor creatinine with other labs. Would be cautious in giving pain medications due to concerns with BP and acute renal failure. Call Rapid Response at anytime with concerns.  Event Summary:   at      at          [place]

## 2017-02-16 NOTE — Progress Notes (Signed)
Patient in bed resting, family visiting. Updated them on plan of care

## 2017-02-16 NOTE — Progress Notes (Signed)
Paged to evaluate patient at bedside due to rectal temperatures  as low as 95.44F and soft blood pressures 95/58. Asked nursing to place warm blankets on patient and hold night lasix dose. Patient intermittently lethargic, but in no acute respiratory distress on evaluation. Will check ammonia level given hepatic dysfunction and transfer patient to stepdown for need of warming blankets and closer monitoring as now he is currently a full code. If ammonia level is elevated will start patient on oral lactulose as tolerated. Day team to readdress in a.m.

## 2017-02-16 NOTE — Progress Notes (Signed)
Upon assessment patient seemed slightly lethargic and BP was low reading 95/58 which day shift nurse had reported being low all day even after 227ml bolus. Patient reported being cold and oral/axillary temps would not read. Patient allowed [place] at this point to do rectal temp which read 95.3 initially and dropped to 95.2 after one hour of warm blankets. MD notified and new orders placed along with sending patient to step down for temperature regulation and BP status. Patients son at [facility] and both patient and son educated prior to being sent to stepdown.

## 2017-02-16 NOTE — Progress Notes (Signed)
Rapid Response Event Note  Overview: Called 2050 for rectal temp of 95.3, physician paged and told RN to place warm blankets and recheck in an hour.    Initial Focused Assessment: Patient resting comfortably, responded to voice and was oriented but clearly lethargic. Initial BP was 85/56, followed by 93/57 and 155/64. NSR. Sating well on room air. Retake of rectal temp was 95.2.  Interventions: Physician paged again and order for lactic and a stepdown bed for the bair hugger placed.      Ernest Lara

## 2017-02-17 DIAGNOSIS — R52 Pain, unspecified: Secondary | ICD-10-CM

## 2017-02-17 DIAGNOSIS — Z7189 Other specified counseling: Secondary | ICD-10-CM

## 2017-02-17 DIAGNOSIS — Z515 Encounter for palliative care: Secondary | ICD-10-CM

## 2017-02-17 LAB — GLUCOSE, CAPILLARY: Glucose-Capillary: 106 mg/dL — ABNORMAL HIGH (ref 65–99)

## 2017-02-17 LAB — BASIC METABOLIC PANEL
ANION GAP: 15 (ref 5–15)
BUN: >100 mg/dL — ABNORMAL HIGH (ref 6–20)
CALCIUM: 7.9 mg/dL — AB (ref 8.9–10.3)
CO2: 16 mmol/L — ABNORMAL LOW (ref 22–32)
CREATININE: 5.46 mg/dL — AB (ref 0.61–1.24)
Chloride: 107 mmol/L (ref 101–111)
GFR, EST AFRICAN AMERICAN: 11 mL/min — AB (ref >60)
GFR, EST NON AFRICAN AMERICAN: 9 mL/min — AB (ref >60)
GLUCOSE: 130 mg/dL — AB (ref 65–99)
Potassium: 4.6 mmol/L (ref 3.5–5.1)
Sodium: 138 mmol/L (ref 135–145)

## 2017-02-17 LAB — CBC
HCT: 23.3 % — ABNORMAL LOW (ref 39.0–52.0)
HEMOGLOBIN: 8 g/dL — AB (ref 13.0–17.0)
MCH: 23 pg — ABNORMAL LOW (ref 26.0–34.0)
MCHC: 34.3 g/dL (ref 30.0–36.0)
MCV: 67 fL — ABNORMAL LOW (ref 78.0–100.0)
PLATELETS: 55 10*3/uL — AB (ref 150–400)
RBC: 3.48 MIL/uL — AB (ref 4.22–5.81)
RDW: 19.2 % — ABNORMAL HIGH (ref 11.5–15.5)
WBC: 18.1 10*3/uL — ABNORMAL HIGH (ref 4.0–10.5)

## 2017-02-17 LAB — MRSA PCR SCREENING: MRSA BY PCR: NEGATIVE

## 2017-02-17 LAB — LACTIC ACID, PLASMA: Lactic Acid, Venous: 2.9 mmol/L (ref 0.5–1.9)

## 2017-02-17 MED ORDER — HYDROMORPHONE HCL 1 MG/ML IJ SOLN: 0.5000 mg | INTRAMUSCULAR | Status: DC | PRN

## 2017-02-17 MED ORDER — SODIUM CHLORIDE 0.9 % IV BOLUS (SEPSIS)
250.0000 mL | Freq: Once | INTRAVENOUS | Status: AC
  Administered 2017-02-17: 250 mL via INTRAVENOUS

## 2017-02-17 MED ORDER — LORAZEPAM 2 MG/ML IJ SOLN: 0.5000 mg | INTRAMUSCULAR | Status: DC | PRN

## 2017-02-17 NOTE — Care Management Note (Signed)
Case Management Note  Patient Details  Name: Ernest Lara MRN: [id number] Date of Birth: Nov 25, 1940  Subjective/Objective:     RR to icu due to hypothermia               Action/Plan: Date:  February 17, 2017 Chart reviewed for concurrent status and case management needs. Will continue to follow patient progress. Discharge Planning: following for needs Expected discharge date: [id number] Velva Harman, BSN, [place], [place]  Expected Discharge Date:   (unknown)               Expected Discharge Plan:  [place]  In-House Referral:     Discharge planning Services  CM Consult  Post Acute Care Choice:    Choice offered to:  Patient  DME Arranged:    DME Agency:     HH Arranged:    [place] Agency:     Status of Service:  In process, will continue to follow  If discussed at Long Length of Stay Meetings, dates discussed:    Additional Comments:  Leeroy Cha, RN 02/17/2017, 8:54 AM

## 2017-02-17 NOTE — Progress Notes (Signed)
Nutrition Brief Note  Chart reviewed. Pt seen by RD for full, initial assessment on 9/5. Pt with colon cancer with lung and liver mets and notes indicate hepatic and renal encephalopathy present. Palliative Care consulted for [place] and talked with pt's daughter and son today.  Pt now transitioning to comfort care.  No further nutrition interventions warranted at this time.  Please re-consult as needed.     Jarome Matin, MS, RD, LDN, [facility] Inpatient Clinical Dietitian Pager # [phone] After hours/weekend pager # [phone]

## 2017-02-17 NOTE — Progress Notes (Signed)
Subjective: Interval History: has complaints wants to set up.  Objective: Vital signs in last 24 hours: Temp:  [95.2 F (35.1 C)-99.5 F (37.5 C)] 99.5 F (37.5 C) (09/10 0900) Pulse Rate:  [64-105] 79 (09/10 0900) Resp:  [7-19] 11 (09/10 0900) BP: (86-109)/(43-64) 90/49 (09/10 0800) SpO2:  [93 %-100 %] 94 % (09/10 0900) Weight:  [111.3 kg (245 lb 6 oz)] 111.3 kg (245 lb 6 oz) (09/10 0357) Weight change: -0.8 kg (-1 lb 12.2 oz)  Intake/Output from previous day: 09/09 0701 - 09/10 0700 In: 120 [P.O.:120] Out: 600 [Urine:600] Intake/Output this shift: Total I/O In: 10 [I.V.:10] Out: 30 [Urine:30]  General appearance: pale, toxic and confused, asterixis Resp: rales bibasilar and rhonchi bibasilar Cardio: S1, S2 normal and systolic murmur: holosystolic 2/6, blowing at apex GI: pos bs, soft, liver down 8 cm Extremitie2-3+edema 2-3+  Lab Results:  Recent Labs  02/16/17 0517 02/17/17 0400  WBC 18.4* 18.1*  HGB 8.8* 8.0*  HCT 25.6* 23.3*  PLT 62* 55*   BMET:  Recent Labs  02/16/17 0517 02/17/17 0400  NA 140 138  K 4.0 4.6  CL 110 107  CO2 17* 16*  GLUCOSE 102* 130*  BUN 86* >100*  CREATININE 4.92* 5.46*  CALCIUM 8.2* 7.9*   No results for input(s): PTH in the last 72 hours. Iron Studies: No results for input(s): IRON, TIBC, TRANSFERRIN, FERRITIN in the last 72 hours.  Studies/Results: No results found.  I have reviewed the patient's current medications.  Assessment/Plan: 1 AKI c/w Hepatorenal vs hypoperfusion with low bps. 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

## 2017-02-17 NOTE — Consult Note (Signed)
Consultation Note Date: 02/17/2017   Patient Name: Ernest Lara  DOB: 11/13/1940  MRN: [id number]  Age / Sex: 76 y.o., male  PCP: Jonathon Jordan, MD Referring Physician: Baird Lyons*  Reason for Consultation: Establishing goals of care, Inpatient hospice referral, Pain control and Terminal Care  HPI/Patient Profile: 76 y.o. male    admitted on 02/11/2017    Clinical Assessment and Goals of Care:  76 yo gentleman with metastatic colon cancer,mets to liver and lungs, admitted with weakness, ongoing progression of cancer despite several treatments, patient with hepatic and renal encephalopathy, worsening AKI consistent with possible hepatorenal syndrome versus hypoperfusion, generalized decline.  A palliative consult has been placed for goals of care discussions.   The patient is resting in bed, he awakens with shallow breathing, then rests back in bed with eyes closed, shallow breathing, he appears withdrawn, he is not awake/alert. The patient's daughter is by his bedside, I interviewed myself and palliative care as follows: Palliative medicine is specialized medical care for people living with serious illness. It focuses on providing relief from the symptoms and stress of a serious illness. The goal is to improve quality of life for both the patient and the family.  Patient's daughter is tearful and states she is aware that the patient is rapidly declining. She stated that the patient's son, her brother Ernest Lara is HCPOA agent, hence, call placed and discussed with Ernest Lara, he is currently at work in [place], [place]. Son Ernest Lara was here over the weekend, and has had discussions with oncology. Son states that he is aware of the patient's current conditions that place him at high risk for ongoing decline and death soon.   We reviewed about what would be most important to the patient at this stage, goals,  wishes and values reviewed, patient's family does not want him to suffer, they enquired about hospice home, son specifically asked about "[employer]'.  We discussed code status in detail, will now establish DNR DNI. Additionally, we discussed about full scope of comfort measures, use of opioids and benzodiazepines for appropriate symptom management. We also discussed about prognosis likely being not more than few days at this point.   See recommendations below, thank you for the consult.   HCPOA  son Ernest Lara is [employer], daughter Ernest Lara, also has 2 other sons.   SUMMARY OF RECOMMENDATIONS    DNR DNI Comfort care  End of life order set implemented D/C medications and labs not important for comfort CSW consult for residential hospice  Comfort cart for family Chaplain consult  Code Status/Advance Care Planning:  DNR    Symptom Management:    as above   Palliative Prophylaxis:   Delirium Protocol  Additional Recommendations (Limitations, Scope, Preferences):  Full Comfort Care  Psycho-social/Spiritual:   Desire for further Chaplaincy support:yes  Additional Recommendations: Education on Hospice  Prognosis:   Hours - Days  Discharge Planning: Hospice facility      Primary Diagnoses: Present on Admission: . 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 Chronic combined  systolic and diastolic CHF, NYHA class 1 ([place]) . Dehydration . Paroxysmal atrial fibrillation (HCC)   I have reviewed the medical record, interviewed the patient and family, and examined the patient. The following aspects are pertinent.  Past Medical History:  Diagnosis Date  . CKD (chronic kidney disease) stage 3, GFR 30-59 ml/min    kidney function concerns with chemo  . Complete heart block ([place])    s. 05/02/2015 s/p BSX U128 [place] CRT-P Beckie Salts).  Marland Kitchen GIB (gastrointestinal bleeding)    a. 10/2015- Hgb 4.6-->8u PRBC's;  b. 10/2015 EGD: non bleeding H pylori + ulceration; c. 10/2015  Colonoscopy: invasive adenocarcinoma.  . History of cardiomyopathy    a. Nonischemic, possibly tachycardia mediated;  b. 04/2015 Echo:  EF 60-65%, no rwma, mild AI/MR, mod dil LA/RA, PASP 82mHg.  .Marland KitchenHypertension   . Hypertensive heart disease   . Paroxysmal atrial fibrillation (HCC)    a. CHA2DS2VASc = 2-3 (Xarelto).  . Stage IIIB Colon Cancer    a. 11/2015 Colonscopy: invasive adenocarcinoma;  b. 11/2015 s/p lap L colectomy; c. Pathology: pT3, pN1c Mx, MSI stable.  . 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 Marital status: Married    Spouse name: N/A  . Number of children: N/A  . Years of education: N/A   Social History Main Topics  . Smoking status: Never Smoker  . Smokeless tobacco: Never Used  . Alcohol use No  . Drug use: No  . Sexual activity: Not Currently   Other Topics Concern  . None   Social History Narrative  . None   Family History  Problem Relation Age of Onset  . Colon cancer Mother   . Kidney disease Father        ESRF/HD, deceased   Scheduled Meds: . feeding supplement (ENSURE ENLIVE)  237 mL Oral BID BM  . furosemide  40 mg Intravenous Q12H  . sodium bicarbonate  650 mg Oral TID  . sodium chloride flush  10-40 mL Intracatheter Q12H   Continuous Infusions: PRN Meds:.acetaminophen, bisacodyl, diphenhydrAMINE, HYDROmorphone (DILAUDID) injection, LORazepam, polyethylene glycol, sodium chloride flush, sodium phosphate Medications Prior to Admission:  Prior to Admission medications   Medication Sig Start Date End Date Taking? Authorizing Provider  acetaminophen (TYLENOL) 500 MG tablet Take 1,000 mg by mouth every 6 (six) hours as needed for mild pain.   Yes [provider]  carvedilol (COREG) 12.5 MG tablet Take 1 tablet (12.5 mg total) by mouth 2 (two) times daily with a meal. 09/11/16  Yes Croitoru, Mihai, MD  doxazosin (CARDURA) 8 MG tablet Take 1 tablet (8 mg total) by mouth at bedtime. 09/11/16  Yes Croitoru, Mihai, MD   furosemide (LASIX) 80 MG tablet Take 1 tablet (80 mg total) by mouth daily. 09/11/16  Yes Croitoru, Mihai, MD  potassium chloride (KLOR-CON M10) 10 MEQ tablet Take 1 tablet (10 mEq total) by mouth every other day. 09/11/16  Yes Croitoru, Mihai, MD  rosuvastatin (CRESTOR) 20 MG tablet Take 1 tablet (20 mg total) by mouth every evening. 09/11/16  Yes Croitoru, Mihai, MD  rivaroxaban (XARELTO) 20 MG TABS tablet Take 1 tablet (20 mg total) by mouth daily. 09/11/16   Croitoru, Mihai, MD  UNABLE TO FIND Glucometer - 1  Lancets - 100  Test strips - 100  Alcohol pads - 100 04/10/16   LOswald Hillock MD   Allergies  Allergen Reactions  . Amiodarone Shortness Of Breath  PULMONARY TOXICITY with AMIODARONE  . Allopurinol     unknown   Review of Systems Non verbal  Physical Exam General appearance: pale, toxic and confused, asterixis Resp: rales bibasilar and rhonchi bibasilar, also with shallow breathing.  Cardio: S1, S2 normal and systolic murmur:   GI: soft Extremities 2-3+edema   Vital Signs: BP (!) 90/49 (BP Location: Right Arm)   Pulse 79   Temp 99.5 F (37.5 C)   Resp 11   Ht 6' 3" (1.905 m)   Wt 111.3 kg (245 lb 6 oz)   SpO2 94%   BMI 30.67 kg/m  Pain Assessment: No/denies pain   Pain Score: Asleep   SpO2: SpO2: 94 % O2 Device:SpO2: 94 % O2 Flow Rate: .O2 Flow Rate (L/min): 2 L/min  IO: Intake/output summary:  Intake/Output Summary (Last 24 hours) at 02/17/17 1036 Last data filed at 02/17/17 0908  Gross per 24 hour  Intake               70 ml  Output              480 ml  Net             -410 ml    LBM: Last BM Date: 02/13/17 Baseline Weight: Weight: 100.7 kg (222 lb) Most recent weight: Weight: 111.3 kg (245 lb 6 oz)     Palliative Assessment/Data:   Flowsheet Rows     Most Recent Value  Intake Tab  Referral Department  Hospitalist  Unit at Time of Referral  Intermediate Care Unit  Palliative Care Primary Diagnosis  Cancer  Palliative Care Type  New  Palliative care  Reason for referral  Pain, Non-pain Symptom, Clarify Goals of Care, Counsel Regarding Hospice, End of Life Care Assistance, Psychosocial or Spiritual support  Date first seen by Palliative Care  02/17/17  Clinical Assessment  Palliative Performance Scale Score  20%  Pain Max last 24 hours  5  Pain Min Last 24 hours  4  Dyspnea Max Last 24 Hours  5  Dyspnea Min Last 24 hours  4  Nausea Max Last 24 Hours  3  Nausea Min Last 24 Hours  2  Anxiety Max Last 24 Hours  4  Anxiety Min Last 24 Hours  3  Psychosocial & Spiritual Assessment  Palliative Care Outcomes  Patient/Family meeting held?  Yes  Who was at the meeting?  patient HCPOA son Shloma over the phone, daughter at bedside.   Palliative Care Outcomes  Clarified goals of care      Time In:  9.30 Time Out:  10.40  Time Total:  70 min  Greater than 50%  of this time was spent counseling and coordinating care related to the above assessment and plan.  Signed by: Loistine Chance, MD  [phone]  Please contact Palliative Medicine Team phone at [phone] for questions and concerns.  For individual provider: See Shea Evans

## 2017-02-17 NOTE — Consult Note (Signed)
[facility] referral today from [place].  Met at bedside today with dtr.-Denise and was also able to have son Eschol Auxier on speaker phone for this consultation.  Reviewed services at [place] [facility] including philosophy of comfort care, multi-disciplinary team approach to care, 2 levels-of-care, and financial subsidy available.  Langley Gauss is tearful at times.  Answered all of Langley Gauss and Kimo's questions.  Reviewed case with Dr Beverlyn Roux who has approved pt for admission to [facility]  Bed is available.  Brittin Janik states he is the [facility] and is not available to sign consent forms today and would like to defer transfer until tomorrow.  Made arrangements to meet family in AM to get all [facility]HHP requisite consent forms signed and then we will transfer by non-emergent ambulance.  Updated [facility] staff RN-Christy and DC planner-Jamie.  Thank you for the opportunity to serve pt and family during this difficult time.  Wynetta Fines, RN [phone]

## 2017-02-17 NOTE — Progress Notes (Addendum)
CSW consulted to assist with residential hospice home placement. PN reviewed. Palliative Care has recommended residential hospice home placement. CSW met with daughter at bedside. She has confirmed plan for residential hospice placement and has requested [facility]. Erling Conte, LCSW, liaison from [facility] contacted and referral provided. Ms. Rosana Hoes will contact CSW once more info is available. CSW will continue to follow to assist with dc planning needs.  Werner Lean LCSW [phone]   12:46: CSW contacted by [facility]. The facility has no availability today. CSW has provided a list of residential hospice homes to pt's daughter. She will review list with her family and contact CSW with hospice choice.   Werner Lean LCSW [phone]  13:07: Pt's daughter contacted CSW with [facility] as residential hospice home choice. CSW has provided referral to [facility] home. Liaison will contact CSW when more info is available.  14:45 : Spoke with liaison from [facility] She has met with pt / daughter this afternoon and feels confident that they can assist. Medical Director will review and liaison is hoping to offer placement tomorrow. CSW will continue to follow to assist with dc planning.  Werner Lean LCSW [phone]   Werner Lean LCSW [phone]

## 2017-02-17 NOTE — Progress Notes (Addendum)
PROGRESS NOTE    Ernest Lara  OFB:[id number] DOB: [birth year] DOA: 02/11/2017 PCP: Jonathon Jordan, MD     Brief Narrative:  Ernest Lara is a 76 year old male with history of metastatic colon cancer with metastases to the liver and lungs and bones admitted with weakness, increased lower back pain, difficulty walking. In the ED, CT imaging of the spinal cord was not possible due to elevated creatinine and MRI was not possible due to the fact that patient has a pacemaker. Decadron was given for possible spinal cord compression. Radiation oncology was consulted. Patient underwent bone scan with spinal cord compression ruled out. He also developed worsening kidney function, nephrology was consulted.  Assessment & Plan:   Principal Problem:   SCC (spinal cord compression) (HCC) Active Problems:   Chronic combined systolic and diastolic CHF, NYHA class 1 (HCC)   Paroxysmal atrial fibrillation (HCC)   Dehydration   ARF (acute renal failure) (HCC)   Goals of care, counseling/discussion   Liver metastases ([place])   Severe protein-calorie malnutrition ([place])   Encounter for palliative care   Generalized pain  Weakness -Secondary to metastatic malignancy, failure to thrive with severe protein calorie malnutrition in setting of chronic illness -Concerned initially for spinal cord compression, but ruled out with bone scan   Metastatic colon cancer -Dr. Irene Limbo following   Acute kidney injury on CKD stage IV -Nephrology consulted, most likely compatible with hepatorenal syndrome vs ATN from hypoperfusion injury  Goals of care -Palliative care was consulted this morning. After discussion with specialists, hospitalist, palliative care physician, family has elected for comfort care and hospice referral.    Code Status: DNR Family Communication: None at bedside Disposition Plan: Hospice    Consultants:   Radiation oncology  Oncology  Nephrology  Palliative care   Procedures:     None  Antimicrobials:  Anti-infectives    Start     Dose/Rate Route Frequency Ordered Stop   02/11/17 1100  cefTRIAXone (ROCEPHIN) 1 g in dextrose 5 % 50 mL IVPB     1 g 100 mL/hr over 30 Minutes Intravenous  Once 02/11/17 1049 02/11/17 1223       Subjective: Lethargic and does not answer questions during exam  Objective: Vitals:   02/17/17 0900 02/17/17 1000 02/17/17 1100 02/17/17 1200  BP:  (!) 96/52  (!) 101/44  Pulse: 79 79 80 78  Resp: 11 11 17 20   Temp: 99.5 F (37.5 C) 99.5 F (37.5 C) 99.5 F (37.5 C) 99.7 F (37.6 C)  TempSrc:    Oral  SpO2: 94% 96% 94% 95%  Weight:      Height:        Intake/Output Summary (Last 24 hours) at 02/17/17 1309 Last data filed at 02/17/17 0908  Gross per 24 hour  Intake               70 ml  Output              480 ml  Net             -410 ml   Filed Weights   02/15/17 0700 02/16/17 0412 02/17/17 0357  Weight: 111.3 kg (245 lb 6 oz) 112.1 kg (247 lb 2.2 oz) 111.3 kg (245 lb 6 oz)    Examination:  General exam: Appears calm and comfortable, Lethargic, arousable to voice but quickly falls asleep, does not stay awake enough to answer questions Respiratory system: Clear to auscultation. Respiratory effort normal. Cardiovascular system: S1 & S2  heard, RRR. No JVD, murmurs, rubs, gallops or clicks.  Gastrointestinal system: Abdomen is nondistended, soft and nontender. No organomegaly or masses felt. Normal bowel sounds heard. Central nervous system: Nonfocal, does not stay awake  Extremities: Symmetric, +1-2 edema bilaterally  Skin: No rashes, lesions or ulcers  Data Reviewed: I have personally reviewed following labs and imaging studies  CBC:  Recent Labs Lab 02/11/17 0940 02/12/17 0622 02/14/17 0506 02/15/17 0328 02/16/17 0517 02/17/17 0400  WBC 13.8* 12.8* 16.3* 16.7* 18.4* 18.1*  NEUTROABS 10.5*  --  13.8* 14.1*  --   --   HGB 9.5* 8.5* 8.5* 8.6* 8.8* 8.0*  HCT 28.1* 24.9* 24.7* 25.2* 25.6* 23.3*  MCV 67.1*  66.4* 66.8* 66.8* 66.5* 67.0*  PLT 148* 162 115* 83* 62* 55*   Basic Metabolic Panel:  Recent Labs Lab 02/14/17 0506 02/14/17 2107 02/15/17 0328 02/16/17 0517 02/17/17 0400  NA 136 136 138 140 138  K 3.9 3.9 4.0 4.0 4.6  CL 109 107 110 110 107  CO2 15* 16* 16* 17* 16*  GLUCOSE 134* 115* 97 102* 130*  BUN 70* 75* 76* 86* >100*  CREATININE 4.66* 4.69* 4.58* 4.92* 5.46*  CALCIUM 8.4* 8.3* 8.3* 8.2* 7.9*   GFR: Estimated Creatinine Clearance: 15.7 mL/min (A) (by C-G formula based on SCr of 5.46 mg/dL (H)). Liver Function Tests:  Recent Labs Lab 02/11/17 0940 02/12/17 0622 02/13/17 1300 02/14/17 0506 02/15/17 0328  AST 230* 256* 256* 231* 267*  ALT 73* 78* 91* 94* 110*  ALKPHOS 446* 411* 493* 492* 430*  BILITOT 2.2* 1.5* 1.4* 1.4* 1.9*  PROT 6.2* 5.8* 6.2* 5.8* 5.7*  ALBUMIN 2.0* 1.8* 1.8* 1.8* 1.9*   No results for input(s): LIPASE, AMYLASE in the last 168 hours.  Recent Labs Lab 02/13/17 1254 02/16/17 2216  AMMONIA 55* 28   Coagulation Profile:  Recent Labs Lab 02/13/17 1300  INR 1.94   Cardiac Enzymes:  Recent Labs Lab 02/11/17 1909 02/12/17 0622 02/12/17 1314 02/12/17 2012 02/13/17 0340  TROPONINI 0.04* 0.08* 0.07* 0.08* 0.09*   BNP (last 3 results) No results for input(s): PROBNP in the last 8760 hours. HbA1C: No results for input(s): HGBA1C in the last 72 hours. CBG:  Recent Labs Lab 02/16/17 0751 02/16/17 1143 02/16/17 1642 02/16/17 2323 02/17/17 0752  GLUCAP 99 109* 111* 135* 106*   Lipid Profile: No results for input(s): CHOL, HDL, LDLCALC, TRIG, CHOLHDL, LDLDIRECT in the last 72 hours. Thyroid Function Tests: No results for input(s): TSH, T4TOTAL, FREET4, T3FREE, THYROIDAB in the last 72 hours. Anemia Panel: No results for input(s): VITAMINB12, FOLATE, FERRITIN, TIBC, IRON, RETICCTPCT in the last 72 hours. Sepsis Labs:  Recent Labs Lab 02/16/17 2216 02/17/17 0400  LATICACIDVEN 2.8* 2.9*    Recent Results (from the  past 240 hour(s))  MRSA PCR Screening     Status: None   Collection Time: 02/16/17 10:16 PM  Result Value Ref Range Status   MRSA by PCR NEGATIVE NEGATIVE Final    Comment:        The GeneXpert MRSA Assay (FDA approved for NASAL specimens only), is one component of a comprehensive MRSA colonization surveillance program. It is not intended to diagnose MRSA infection nor to guide or monitor treatment for MRSA infections.        Radiology Studies: No results found.    Scheduled Meds: . feeding supplement (ENSURE ENLIVE)  237 mL Oral BID BM  . sodium chloride flush  10-40 mL Intracatheter Q12H   Continuous Infusions:   LOS:  6 days    Time spent: 40 minutes   Dessa Phi, DO [facility] [email or web address] Password [email or web address] 02/17/2017, 1:09 PM

## 2017-02-17 NOTE — Progress Notes (Signed)
   02/17/17 1600  Clinical Encounter Type  Visited With Patient and family together  Visit Type Initial  Referral From Nurse  Consult/Referral To Chaplain  Spiritual Encounters  Spiritual Needs Emotional;Prayer  Stress Factors  Family Stress Factors Major life changes   Patient was semi aware of my presence.  Spent time with the daughter Langley Gauss who is the youngest of 4.  The older brothers.  Mother passed away in 08/28/2007 so daughter is recalling that journey.  Hospice nurse met with family about moving him.  Daughter shared some about her dad's life especially as a basketball coach at [employer].  We talked patient's belief but no church home.  The daughter seemed at peace with decisions being made.  Will pass along a follow up to the Chaplains.  Chaplain Katherene Ponto

## 2017-02-18 ENCOUNTER — Ambulatory Visit: Payer: Medicare Other | Admitting: Hematology

## 2017-02-18 ENCOUNTER — Other Ambulatory Visit: Payer: Medicare Other

## 2017-02-18 ENCOUNTER — Ambulatory Visit: Payer: Medicare Other

## 2017-02-18 DIAGNOSIS — R531 Weakness: Secondary | ICD-10-CM

## 2017-02-18 NOTE — Progress Notes (Signed)
Daily Progress Note   Patient Name: Ernest Lara       Date: 02/18/2017 DOB: February 26, 1941  Age: 76 y.o. MRN#: [id number] Attending Physician: No att. providers found Primary Care Physician: Ernest Jordan, MD Admit Date: 02/11/2017  Reason for Consultation/Follow-up: Establishing goals of care, Hospice Evaluation, Non pain symptom management and Pain control  Subjective: I saw and examined Ernest Lara.  Met with family at bedside and discussed plan of care, including plan to transition to residential hospice.  Length of Stay: 7  Physical Exam         General appearance: pale, toxic and confused, asterixis Resp: rales bibasilarand rhonchi bibasilar, also with shallow breathing.  Cardio: S1, S2 normal and systolic murmur:   GI: soft Extremities 2-3+edema   Vital Signs: BP (!) 112/41   Pulse 69   Temp (!) 95 F (35 C)   Resp (!) 9   Ht _0  (1.905 m)   Wt 111.3 kg (245 lb 6 oz)   SpO2 97%   BMI 30.67 kg/m  SpO2: SpO2: 97 % O2 Device: O2 Device: Not Delivered O2 Flow Rate: O2 Flow Rate (L/min): 2 L/min  Intake/output summary:  Intake/Output Summary (Last 24 hours) at 02/18/17 1520 Last data filed at 02/18/17 1200  Gross per 24 hour  Intake                0 ml  Output               50 ml  Net              -50 ml   LBM: Last BM Date: 02/13/17 Baseline Weight: Weight: 100.7 kg (222 lb) Most recent weight: Weight: 111.3 kg (245 lb 6 oz)       Palliative Assessment/Data:    Flowsheet Rows     Most Recent Value  Intake Tab  Referral Department  Hospitalist  Unit at Time of Referral  Intermediate Care Unit  Palliative Care Primary Diagnosis  Cancer  Palliative Care Type  New Palliative care  Reason for referral  Pain, Non-pain Symptom, Clarify Goals of Care,  Counsel Regarding Hospice, End of Life Care Assistance, Psychosocial or Spiritual support  Date first seen by Palliative Care  02/17/17  Clinical Assessment  Palliative Performance Scale Score  20%  Pain  Max last 24 hours  5  Pain Min Last 24 hours  4  Dyspnea Max Last 24 Hours  5  Dyspnea Min Last 24 hours  4  Nausea Max Last 24 Hours  3  Nausea Min Last 24 Hours  2  Anxiety Max Last 24 Hours  4  Anxiety Min Last 24 Hours  3  Psychosocial & Spiritual Assessment  Palliative Care Outcomes  Patient/Family meeting held?  Yes  Who was at the meeting?  patient HCPOA son Ernest Lara over the phone, daughter at bedside.   Palliative Care Outcomes  Clarified goals of care      Patient Active Problem List   Diagnosis Date Noted  . Weakness 02/18/2017  . Encounter for palliative care   . Generalized pain   . Goals of care, counseling/discussion 02/13/2017  . Liver metastases ([place])   . Severe protein-calorie malnutrition ([place])   . 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 ARF (acute renal failure) ([place]) 02/11/2017  . Dehydration 04/09/2016  . Biventricular cardiac pacemaker in situ 03/11/2016  . Chemotherapy induced neutropenia ([place]) 02/07/2016  . Long term current use of anticoagulant therapy 02/07/2016  . Renal insufficiency 02/07/2016  . Port catheter in place 01/24/2016  . B12 deficiency 12/06/2015  . Malignant neoplasm of colon ([place])   . GIB (gastrointestinal bleeding)   . History of cardiomyopathy   . Paroxysmal atrial fibrillation ([facility])   . Absolute anemia   . Chronic diastolic CHF (congestive heart failure) ([place])   . Uncontrolled type 2 diabetes mellitus with complication ([place])   . Chronic diastolic heart failure ([place]) 11/10/2015  . Iron deficiency anemia   . Chronic blood loss anemia   . Anemia 11/03/2015  . Complete heart block ([place]) 05/01/2015  . 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 Chronic combined systolic and diastolic CHF, NYHA class 1 ([place])  12/24/2012  . Hyperlipidemia 12/24/2012  . LBBB (left bundle branch block) 12/24/2012  . CKD (chronic kidney disease) stage 3, GFR 30-59 ml/min 10/13/2011  . PAF (paroxysmal atrial fibrillation), recent DCCV 10/11/11 to SR. 10/13/2011  . Amiodarone pulmonary toxicity, and associated pulmonary edema 10/12/2011  . OSA (obstructive sleep apnea), pt with apnea during procedures 10/11/2011  . Shortness of breath, acute, awakened from sleep, 10/12/11, secondary to pulmonary toxicity to amiodarone 10/08/2011  . Atrial fibrillation with RVR, new onset, 10/08/11, DCCV to SR on 10/11/11 10/08/2011  . DM (diabetes mellitus), type 2  10/08/2011  . HTN (hypertension) 10/08/2011  . Acute systolic CHF (congestive heart failure), new onset, could be related to tachycardia 10/08/2011  . Cardiomyopathy, poss. 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  Recommendations/Plan:  To residential hospice today.  Symptoms currently well managed.  Appears stable for transport to residential hospice.  Addressed family concerns and provided anticipatory counseling.  Goals of Care and Additional Recommendations:  Limitations on Scope of Treatment: Full Comfort Care  Code Status: Code Status History    Date Active Date Inactive Code Status Order ID Comments User Context   02/17/2017 10:32 AM 02/18/2017  3:06 PM DNR [id number]  Loistine Chance, MD Inpatient   02/11/2017  3:49 PM 02/17/2017 10:32 AM Full Code [id number]  Vashti Hey, MD Inpatient   04/09/2016  6:27 PM 04/10/2016  8:21 PM Full Code [id number]  Donne Hazel, MD Inpatient    11/03/2015 10:12 PM 11/15/2015  4:30 PM Full Code [id number]  Norval Morton, MD ED   05/02/2015  6:17 PM 05/03/2015  5:05 PM Full Code [id number]  Evans Lance, MD Inpatient   05/01/2015  4:57 PM 05/02/2015  6:17 PM Full Code [id number]  Baldwin [place], PA-C Inpatient   10/12/2011  5:02 AM 10/13/2011  5:37 PM Full Code [id number]  Sid Falcon, RN Inpatient   10/08/2011  1:51 PM 10/11/2011  6:33 PM Full Code [id number]  Kalman Drape, RN Inpatient    Questions for Most Recent Historical Code Status (Order [id number])    Question Answer Comment   In the event of cardiac or respiratory ARREST Do not call a "code blue"    In the event of cardiac or respiratory ARREST Do not perform Intubation, CPR, defibrillation or ACLS    In the event of cardiac or respiratory ARREST Use medication by any route, position, wound care, and other measures to relive pain and suffering. May use oxygen, suction and manual treatment of airway obstruction as needed for comfort.         Advance Directive Documentation     Most Recent Value  Type of Advance Directive  Healthcare Power of Attorney, Living will  Pre-existing out of facility DNR order (yellow form or pink MOST form)  -  "MOST" Form in Place?  -       Prognosis:   < 2 weeks  Discharge Planning:  Hospice facility  Care plan was discussed with Cheri from [facility] in [place].  Family, bedside RN  Thank you for allowing the Palliative Medicine Team to assist in the care of this patient.   Total Time 20 Prolonged Time Billed No      Greater than 50%  of this time was spent counseling and coordinating care related to the above assessment and plan.  Micheline Rough, MD  Please contact Palliative Medicine Team phone at [phone] for questions and concerns.

## 2017-02-18 NOTE — Discharge Summary (Signed)
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 In the ED, CT imaging of the spinal cord was not possible due to elevated creatinine and MRI was not possible due to the fact that patient has a pacemaker. Decadron was given for possible spinal cord compression. Radiation oncology was consulted. Patient underwent bone scan with spinal cord compression ruled out. He also developed worsening kidney function, nephrology was consulted. Due to aggressive and rapidly progressing cancer, declining functional status, worsening liver and kidney function, family decided on comfort care and residential hospice.   Discharge Diagnoses:  Principal Problem:   Weakness Active Problems:   Chronic combined systolic and diastolic CHF, NYHA class 1 (HCC)   Paroxysmal atrial fibrillation (HCC)   Dehydration   ARF (acute renal failure) (HCC)   Goals of care, counseling/discussion   Liver metastases ([place])   Severe protein-calorie malnutrition ([place])   Encounter for palliative care   Generalized pain  Weakness -Secondary to metastatic malignancy, failure to thrive with severe protein calorie malnutrition in setting of chronic illness -Concerned initially for spinal cord compression, but ruled out with bone scan   Metastatic colon cancer -Dr. Irene Limbo following   Acute kidney injury on CKD stage IV -Nephrology consulted, most likely compatible with hepatorenal syndrome vs ATN from hypoperfusion injury  Paroxysmal A Fib -Stop xarelto as patient  now comfort care   Goals of care -Palliative care was consulted. After discussion with specialists, hospitalist, palliative care physician, family has elected for comfort care and hospice referral.    Discharge Instructions   Allergies as of 02/18/2017      Reactions   Amiodarone Shortness Of Breath   PULMONARY TOXICITY with AMIODARONE   Allopurinol    unknown      Medication List    STOP taking these medications   acetaminophen 500 MG tablet Commonly known as:  TYLENOL   carvedilol 12.5 MG tablet Commonly known as:  COREG   doxazosin 8 MG tablet Commonly known as:  CARDURA   furosemide 80 MG tablet Commonly known as:  LASIX   potassium chloride 10 MEQ tablet Commonly known as:  KLOR-CON M10   rivaroxaban 20 MG Tabs tablet Commonly known as:  XARELTO   rosuvastatin 20 MG tablet Commonly known as:  CRESTOR   UNABLE TO FIND       Allergies  Allergen Reactions  . Amiodarone Shortness Of Breath    PULMONARY TOXICITY with AMIODARONE  . Allopurinol     unknown    Consultations:  Radiation oncology  Oncology  Nephrology  Palliative care    Procedures/Studies: Ct Abdomen Pelvis Wo Contrast  Result Date: 02/11/2017 CLINICAL DATA:  Abdominal distention.  Colon cancer. EXAM: CT CHEST, ABDOMEN AND PELVIS WITHOUT CONTRAST TECHNIQUE: Multidetector CT imaging of the chest, abdomen and pelvis was performed following the standard protocol without IV contrast. COMPARISON:  CT 01/22/2017.  Chest CT 11/08/2015 FINDINGS: CT CHEST FINDINGS Cardiovascular: Pacer wires noted in the right heart. Severe diffuse coronary artery calcifications. Scattered aortic calcifications. No aneurysm. Heart is normal size. Mediastinum/Nodes: Innumerable bilateral small pulmonary nodules compatible with diffuse metastatic  involvement of the lungs. Small right pleural effusion. Lungs/Pleura: No mediastinal, hilar, or axillary adenopathy. Musculoskeletal: Chest wall soft tissues are  unremarkable. Degenerative changes in the thoracic spine. Diffuse sclerotic appearance of the bony structures without focal lytic lesion or destructive process. CT ABDOMEN PELVIS FINDINGS Hepatobiliary: Innumerable large masses throughout the liver, difficult to measure due to the noncontrast nature of the study. Findings are likely similar to recent abdominal CT. Gallbladder unremarkable. Pancreas: No focal abnormality or ductal dilatation. Spleen: Small low-density lesion in the posterior spleen on image 54 measures 13 mm. Cannot exclude metastasis. Adrenals/Urinary Tract: Small low-density lesions in the kidneys bilaterally, stable, likely small cysts. Punctate nonobstructing stone in the lower pole of the left kidney. Adrenal glands and urinary bladder unremarkable. Stomach/Bowel: Postoperative changes in the distal transverse colon near the splenic flexure. Colonic diverticulosis. No visible active diverticulitis. Stomach and small bowel decompressed, unremarkable. Vascular/Lymphatic: Aortic and iliac calcifications. No aneurysm or adenopathy. Reproductive: Prostate enlargement. Other: No free air. Free fluid adjacent to the liver and extending in the right paracolic gutter and right pelvis. Musculoskeletal: No focal lytic or destructive bone lesion. Diffuse degenerative changes in the lumbar spine. Diffuse sclerotic appearance of the bones. IMPRESSION: Innumerable/diffuse nodules throughout the lungs compatible with metastatic disease. Small right pleural effusion. Extensive coronary artery disease. Innumerable large hepatic metastases, similar to prior study. Small hypodensity in the spleen, cannot exclude metastasis. Left lower pole nephrolithiasis. Free fluid adjacent to the liver and in the right abdomen/ pelvis. Diffuse sclerotic appearance of the bony structures without visible destructive process or lytic lesion. Cannot completely exclude diffuse metastatic involvement. Consider further evaluation with  bone scan. Electronically Signed   By: Rolm Baptise M.D.   On: 02/11/2017 11:40   Ct Abdomen Pelvis Wo Contrast  Result Date: 01/22/2017 CLINICAL DATA:  Status post partial colectomy 11/09/2016 for distal transverse colonic adenocarcinoma. Status post chemotherapy completed January 2018. Restaging. EXAM: CT ABDOMEN AND PELVIS WITHOUT CONTRAST TECHNIQUE: Multidetector CT imaging of the abdomen and pelvis was performed following the standard protocol without IV contrast. COMPARISON:  11/08/2015 CT abdomen/ pelvis. FINDINGS: Lower chest: There are numerous (greater than 20) solid pulmonary nodules randomly distributed throughout both lung bases, largest 6 mm in the medial right lower lobe (series 4/image 8) and 6 mm in the anterior left lower lobe (series 4/ image 2), all new since 11/08/2015. Patchy punctate calcifications throughout the right lung base are stable and compatible with remote postinflammatory change. Coronary atherosclerosis. Pacer leads are seen in the right atrium, right ventricle and coronary sinus. Hepatobiliary: Liver is newly enlarged by numerous (greater than 10) new hypodense confluent liver masses scattered throughout the liver. Representative liver masses include a 7.9 x 7.4 cm superior right liver lobe mass (series 3/ image 15) and a 7.7 x 5.3 cm posterior left liver lobe mass (series 3/image 18). Normal gallbladder with no radiopaque cholelithiasis. No biliary ductal dilatation. Pancreas: Normal, with no mass or duct dilation. Spleen: Normal size. No mass. Adrenals/Urinary Tract: Stable appearance of the adrenal glands without discrete adrenal nodules. No hydronephrosis. Nonobstructing 3 mm lower left renal stone. No additional renal stones. Simple 1.7 cm lateral interpolar left renal cyst. Otherwise no contour deforming renal lesions. Normal bladder. Stomach/Bowel: Grossly normal stomach. Normal caliber small bowel with no small bowel wall thickening. Normal appendix. Interval partial  transverse colectomy. No discrete mass or wall thickening at the left upper quadrant colonic anastomosis. Mild diverticulosis in the left colon, with no large bowel wall thickening or pericolonic  fat stranding. Oral contrast reaches the rectum. Vascular/Lymphatic: Atherosclerotic nonaneurysmal abdominal aorta. No pathologically enlarged lymph nodes in the abdomen or pelvis. Reproductive: Stable moderate prostatomegaly. Other: No pneumoperitoneum, ascites or focal fluid collection. Musculoskeletal: No aggressive appearing focal osseous lesions. Marked thoracolumbar spondylosis. IMPRESSION: 1. Numerous new hypodense liver masses scattered throughout the liver, most compatible with bulky hepatic metastatic disease. 2. Numerous new solid pulmonary nodules throughout both lung bases, most compatible with pulmonary metastases. 3. No evidence of local tumor recurrence at the colonic anastomosis. No abdominopelvic adenopathy. 4. Additional findings include Aortic Atherosclerosis (ICD10-I70.0), moderate prostatomegaly, mild left colonic diverticulosis and nonobstructing left renal stone. These results will be called to the ordering clinician or representative by the Radiologist Assistant, and communication documented in the PACS or zVision Dashboard. Electronically Signed   By: Ilona Sorrel M.D.   On: 01/22/2017 14:09   Dg Chest 2 View  Result Date: 02/11/2017 CLINICAL DATA:  Weakness, shortness of Breath EXAM: CHEST  2 VIEW COMPARISON:  12/29/2015 FINDINGS: Right Port-A-Cath and left pacer remain in place, unchanged. Patchy bilateral airspace opacities are noted, new since prior study. Heart is upper limits normal in size. No effusions or acute bony abnormality. IMPRESSION: New patchy bilateral airspace disease which could reflect edema or infection. Electronically Signed   By: Rolm Baptise M.D.   On: 02/11/2017 10:45   Ct Head Wo Contrast  Result Date: 02/11/2017 CLINICAL DATA:  Weakness, difficulty walking. EXAM: CT  HEAD WITHOUT CONTRAST TECHNIQUE: Contiguous axial images were obtained from the base of the skull through the vertex without intravenous contrast. COMPARISON:  None. FINDINGS: Brain: No acute intracranial abnormality. Specifically, no hemorrhage, hydrocephalus, mass lesion, acute infarction, or significant intracranial injury. Vascular: No hyperdense vessel or unexpected calcification. Skull: No acute calvarial abnormality. Sinuses/Orbits: Mucosal thickening in the left maxillary sinus. No air-fluid levels. Mastoid air cells are clear. Orbital soft tissues unremarkable. Other: None IMPRESSION: No acute intracranial abnormality. Chronic left maxillary sinusitis. Electronically Signed   By: Rolm Baptise M.D.   On: 02/11/2017 10:23   Ct Chest Wo Contrast  Result Date: 02/11/2017 CLINICAL DATA:  Abdominal distention.  Colon cancer. EXAM: CT CHEST, ABDOMEN AND PELVIS WITHOUT CONTRAST TECHNIQUE: Multidetector CT imaging of the chest, abdomen and pelvis was performed following the standard protocol without IV contrast. COMPARISON:  CT 01/22/2017.  Chest CT 11/08/2015 FINDINGS: CT CHEST FINDINGS Cardiovascular: Pacer wires noted in the right heart. Severe diffuse coronary artery calcifications. Scattered aortic calcifications. No aneurysm. Heart is normal size. Mediastinum/Nodes: Innumerable bilateral small pulmonary nodules compatible with diffuse metastatic involvement of the lungs. Small right pleural effusion. Lungs/Pleura: No mediastinal, hilar, or axillary adenopathy. Musculoskeletal: Chest wall soft tissues are unremarkable. Degenerative changes in the thoracic spine. Diffuse sclerotic appearance of the bony structures without focal lytic lesion or destructive process. CT ABDOMEN PELVIS FINDINGS Hepatobiliary: Innumerable large masses throughout the liver, difficult to measure due to the noncontrast nature of the study. Findings are likely similar to recent abdominal CT. Gallbladder unremarkable. Pancreas: No  focal abnormality or ductal dilatation. Spleen: Small low-density lesion in the posterior spleen on image 54 measures 13 mm. Cannot exclude metastasis. Adrenals/Urinary Tract: Small low-density lesions in the kidneys bilaterally, stable, likely small cysts. Punctate nonobstructing stone in the lower pole of the left kidney. Adrenal glands and urinary bladder unremarkable. Stomach/Bowel: Postoperative changes in the distal transverse colon near the splenic flexure. Colonic diverticulosis. No visible active diverticulitis. Stomach and small bowel decompressed, unremarkable. Vascular/Lymphatic: Aortic and iliac calcifications. No aneurysm or adenopathy.  Reproductive: Prostate enlargement. Other: No free air. Free fluid adjacent to the liver and extending in the right paracolic gutter and right pelvis. Musculoskeletal: No focal lytic or destructive bone lesion. Diffuse degenerative changes in the lumbar spine. Diffuse sclerotic appearance of the bones. IMPRESSION: Innumerable/diffuse nodules throughout the lungs compatible with metastatic disease. Small right pleural effusion. Extensive coronary artery disease. Innumerable large hepatic metastases, similar to prior study. Small hypodensity in the spleen, cannot exclude metastasis. Left lower pole nephrolithiasis. Free fluid adjacent to the liver and in the right abdomen/ pelvis. Diffuse sclerotic appearance of the bony structures without visible destructive process or lytic lesion. Cannot completely exclude diffuse metastatic involvement. Consider further evaluation with bone scan. Electronically Signed   By: Rolm Baptise M.D.   On: 02/11/2017 11:40   Nm Bone Scan Whole Body  Result Date: 02/12/2017 CLINICAL DATA:  Metastatic colon cancer, metastatic synchronous adenocarcinoma suspected or proven initial workup, history hypertension, type II diabetes mellitus, paroxysmal atrial fibrillation, cardiomyopathy, grade 3 chronic renal insufficiency creatinine 3.6 EXAM:  NUCLEAR MEDICINE WHOLE BODY BONE SCAN TECHNIQUE: Whole body anterior and posterior images were obtained approximately 3 hours after intravenous injection of radiopharmaceutical. RADIOPHARMACEUTICALS:  20.7 mCi Technetium-43mMDP IV COMPARISON:  None Radiographic correlation: CT chest abdomen pelvis 02/11/2017 FINDINGS: Mild abnormal increased tracer localization at the anterior costochondral junctions diffusely, typically metabolic. Symmetric uptake at the proximal humeri and proximal femora, likely metabolic or physiologic as well. No definite sites of abnormal osseous tracer accumulation are identified which are suspicious for osseous metastatic disease. Expected urinary tract and soft tissue distribution of tracer. IMPRESSION: No definite scintigraphic evidence of osseous metastatic disease. 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 Electronically Signed   By: MLavonia DanaM.D.   On: 02/12/2017 17:01   [place]al  Result Date: 02/13/2017 CLINICAL DATA:  Renal failure. EXAM: RENAL / URINARY TRACT ULTRASOUND COMPLETE COMPARISON:  CT 02/11/2017 FINDINGS: Right Kidney: Length: 11.9 cm. 1 cm cyst lower pole. Echogenicity within normal limits. No mass or hydronephrosis visualized. Left Kidney: Length: 11.9 cm. 2.4 cm cyst midportion. Echogenicity within normal limits. No mass or hydronephrosis visualized. Bladder: Appears normal for degree of bladder distention. IMPRESSION: Normal renal size and echogenicity.  No hydronephrosis. Electronically Signed   By: MNelson ChimesM.D.   On: 02/13/2017 09:04   Ir [place]Guide Vasc Access Right  Result Date: 02/03/2017 INDICATION: History of metastatic colon cancer. In need of durable intravenous access for chemotherapy administration. EXAM: IMPLANTED PORT A CATH PLACEMENT WITH ULTRASOUND AND FLUOROSCOPIC GUIDANCE COMPARISON:  Chest CT - 11/08/2015 MEDICATIONS: Ancef 2 gm IV; The antibiotic was administered within an  appropriate time interval prior to skin puncture. ANESTHESIA/SEDATION: Moderate (conscious) sedation was employed during this procedure. A total of Versed 2 mg and Fentanyl 100 mcg was administered intravenously. Moderate Sedation Time: 25 minutes. The patient's level of consciousness and vital signs were monitored continuously by radiology nursing throughout the procedure under my direct supervision. CONTRAST:  None FLUOROSCOPY TIME:  18 seconds (7 mGy) COMPLICATIONS: None immediate. PROCEDURE: The procedure, risks, benefits, and alternatives were explained to the patient. Questions regarding the procedure were encouraged and answered. The patient understands and consents to the procedure. The right neck and chest were prepped with chlorhexidine in a sterile fashion, and a sterile drape was applied covering the operative field. Maximum barrier sterile technique with sterile gowns and gloves were used for the procedure. A timeout was performed prior to the initiation  of the procedure. Local anesthesia was provided with 1% lidocaine with epinephrine. After creating a small venotomy incision, a micropuncture kit was utilized to access the internal jugular vein. Real-time ultrasound guidance was utilized for vascular access including the acquisition of a permanent ultrasound image documenting patency of the accessed vessel. The microwire was utilized to measure appropriate catheter length. A subcutaneous port pocket was then created along the upper chest wall utilizing a combination of sharp and blunt dissection. The pocket was irrigated with sterile saline. A single lumen ISP power injectable port was chosen for placement. The 8 Fr catheter was tunneled from the port pocket site to the venotomy incision. The port was placed in the pocket. The external catheter was trimmed to appropriate length. At the venotomy, an 8 Fr peel-away sheath was placed over a guidewire under fluoroscopic guidance. The catheter was then  placed through the sheath and the sheath was removed. Final catheter positioning was confirmed and documented with a fluoroscopic spot radiograph. The port was accessed with a Huber needle, aspirated and flushed with heparinized saline. The venotomy site was closed with an interrupted 4-0 Vicryl suture. The port pocket incision was closed with interrupted 2-0 Vicryl suture and the skin was opposed with a running subcuticular 4-0 Vicryl suture. Dermabond and Steri-strips were applied to both incisions. Dressings were placed. The patient tolerated the procedure well without immediate post procedural complication. FINDINGS: After catheter placement, the tip lies within the superior cavoatrial junction. The catheter aspirates and flushes normally and is ready for immediate use. IMPRESSION: Successful placement of a right internal jugular approach power injectable Port-A-Cath. The catheter is ready for immediate use. Electronically Signed   By: Sandi Mariscal M.D.   On: 02/03/2017 13:16   Ir Fluoro Guide Port Insertion Right  Result Date: 02/03/2017 INDICATION: History of metastatic colon cancer. In need of durable intravenous access for chemotherapy administration. EXAM: IMPLANTED PORT A CATH PLACEMENT WITH ULTRASOUND AND FLUOROSCOPIC GUIDANCE COMPARISON:  Chest CT - 11/08/2015 MEDICATIONS: Ancef 2 gm IV; The antibiotic was administered within an appropriate time interval prior to skin puncture. ANESTHESIA/SEDATION: Moderate (conscious) sedation was employed during this procedure. A total of Versed 2 mg and Fentanyl 100 mcg was administered intravenously. Moderate Sedation Time: 25 minutes. The patient's level of consciousness and vital signs were monitored continuously by radiology nursing throughout the procedure under my direct supervision. CONTRAST:  None FLUOROSCOPY TIME:  18 seconds (7 mGy) COMPLICATIONS: None immediate. PROCEDURE: The procedure, risks, benefits, and alternatives were explained to the patient.  Questions regarding the procedure were encouraged and answered. The patient understands and consents to the procedure. The right neck and chest were prepped with chlorhexidine in a sterile fashion, and a sterile drape was applied covering the operative field. Maximum barrier sterile technique with sterile gowns and gloves were used for the procedure. A timeout was performed prior to the initiation of the procedure. Local anesthesia was provided with 1% lidocaine with epinephrine. After creating a small venotomy incision, a micropuncture kit was utilized to access the internal jugular vein. Real-time ultrasound guidance was utilized for vascular access including the acquisition of a permanent ultrasound image documenting patency of the accessed vessel. The microwire was utilized to measure appropriate catheter length. A subcutaneous port pocket was then created along the upper chest wall utilizing a combination of sharp and blunt dissection. The pocket was irrigated with sterile saline. A single lumen ISP power injectable port was chosen for placement. The 8 Fr catheter was tunneled from the  port pocket site to the venotomy incision. The port was placed in the pocket. The external catheter was trimmed to appropriate length. At the venotomy, an 8 Fr peel-away sheath was placed over a guidewire under fluoroscopic guidance. The catheter was then placed through the sheath and the sheath was removed. Final catheter positioning was confirmed and documented with a fluoroscopic spot radiograph. The port was accessed with a Huber needle, aspirated and flushed with heparinized saline. The venotomy site was closed with an interrupted 4-0 Vicryl suture. The port pocket incision was closed with interrupted 2-0 Vicryl suture and the skin was opposed with a running subcuticular 4-0 Vicryl suture. Dermabond and Steri-strips were applied to both incisions. Dressings were placed. The patient tolerated the procedure well without  immediate post procedural complication. FINDINGS: After catheter placement, the tip lies within the superior cavoatrial junction. The catheter aspirates and flushes normally and is ready for immediate use. IMPRESSION: Successful placement of a right internal jugular approach power injectable Port-A-Cath. The catheter is ready for immediate use. Electronically Signed   By: Sandi Mariscal M.D.   On: 02/03/2017 13:16    Echo  Study Conclusions  - Left ventricle: The cavity size was normal. Wall thickness was   normal. Systolic function was normal. The estimated ejection   fraction was in the range of 55% to 60%. - Aortic valve: There was trivial regurgitation. - Left atrium: The atrium was moderately dilated. - Right atrium: The atrium was mildly dilated. - Pulmonary arteries: PA peak pressure: 42 mm Hg (S).   Discharge Exam: Vitals:   02/18/17 0400 02/18/17 0500  BP: (!) 88/51   Pulse: 71 70  Resp: (!) 8 (!) 8  Temp: (!) 96.4 F (35.8 C) (!) 96.3 F (35.7 C)  SpO2: 95% 96%   Vitals:   02/18/17 0200 02/18/17 0300 02/18/17 0400 02/18/17 0500  BP: (!) 96/40  (!) 88/51   Pulse: 73 72 71 70  Resp: (!) 9 (!) 8 (!) 8 (!) 8  Temp: (!) 97.3 F (36.3 C) (!) 96.8 F (36 C) (!) 96.4 F (35.8 C) (!) 96.3 F (35.7 C)  TempSrc:   Core (Comment)   SpO2: 94% 96% 95% 96%  Weight:      Height:        General: Pt is alert, awake, not in acute distress, confused  Cardiovascular: RRR, S1/S2 +, no rubs, no gallops Respiratory: CTA bilaterally, no wheezing, no rhonchi Abdominal: Soft, NT, ND, bowel sounds + Extremities: no edema, no cyanosis    The results of significant diagnostics from this hospitalization (including imaging, microbiology, ancillary and laboratory) are listed below for reference.     Microbiology: Recent Results (from the past 240 hour(s))  MRSA PCR Screening     Status: None   Collection Time: 02/16/17 10:16 PM  Result Value Ref Range Status   MRSA by PCR NEGATIVE  NEGATIVE Final    Comment:        The GeneXpert MRSA Assay (FDA approved for NASAL specimens only), is one component of a comprehensive MRSA colonization surveillance program. It is not intended to diagnose MRSA infection nor to guide or monitor treatment for MRSA infections.      Labs: BNP (last 3 results)  Recent Labs  02/11/17 0940  BNP 834.1*   Basic Metabolic Panel:  Recent Labs Lab 02/14/17 0506 02/14/17 2107 02/15/17 0328 02/16/17 0517 02/17/17 0400  NA 136 136 138 140 138  K 3.9 3.9 4.0 4.0 4.6  CL 109  107 110 110 107  CO2 15* 16* 16* 17* 16*  GLUCOSE 134* 115* 97 102* 130*  BUN 70* 75* 76* 86* >100*  CREATININE 4.66* 4.69* 4.58* 4.92* 5.46*  CALCIUM 8.4* 8.3* 8.3* 8.2* 7.9*   Liver Function Tests:  Recent Labs Lab 02/11/17 0940 02/12/17 0622 02/13/17 1300 02/14/17 0506 02/15/17 0328  AST 230* 256* 256* 231* 267*  ALT 73* 78* 91* 94* 110*  ALKPHOS 446* 411* 493* 492* 430*  BILITOT 2.2* 1.5* 1.4* 1.4* 1.9*  PROT 6.2* 5.8* 6.2* 5.8* 5.7*  ALBUMIN 2.0* 1.8* 1.8* 1.8* 1.9*   No results for input(s): LIPASE, AMYLASE in the last 168 hours.  Recent Labs Lab 02/13/17 1254 02/16/17 2216  AMMONIA 55* 28   CBC:  Recent Labs Lab 02/11/17 0940 02/12/17 0622 02/14/17 0506 02/15/17 0328 02/16/17 0517 02/17/17 0400  WBC 13.8* 12.8* 16.3* 16.7* 18.4* 18.1*  NEUTROABS 10.5*  --  13.8* 14.1*  --   --   HGB 9.5* 8.5* 8.5* 8.6* 8.8* 8.0*  HCT 28.1* 24.9* 24.7* 25.2* 25.6* 23.3*  MCV 67.1* 66.4* 66.8* 66.8* 66.5* 67.0*  PLT 148* 162 115* 83* 62* 55*   Cardiac Enzymes:  Recent Labs Lab 02/11/17 1909 02/12/17 0622 02/12/17 1314 02/12/17 2012 02/13/17 0340  TROPONINI 0.04* 0.08* 0.07* 0.08* 0.09*   BNP: Invalid input(s): POCBNP CBG:  Recent Labs Lab 02/16/17 0751 02/16/17 1143 02/16/17 1642 02/16/17 2323 02/17/17 0752  GLUCAP 99 109* 111* 135* 106*   D-Dimer No results for input(s): DDIMER in the last 72 hours. Hgb A1c No  results for input(s): HGBA1C in the last 72 hours. Lipid Profile No results for input(s): CHOL, HDL, LDLCALC, TRIG, CHOLHDL, LDLDIRECT in the last 72 hours. Thyroid function studies No results for input(s): TSH, T4TOTAL, T3FREE, THYROIDAB in the last 72 hours.  Invalid input(s): FREET3 Anemia work up No results for input(s): VITAMINB12, FOLATE, FERRITIN, TIBC, IRON, RETICCTPCT in the last 72 hours. Urinalysis    Component Value Date/Time   COLORURINE YELLOW 02/16/2017 1726   APPEARANCEUR CLOUDY (A) 02/16/2017 1726   LABSPEC 1.009 02/16/2017 1726   PHURINE 5.0 02/16/2017 1726   GLUCOSEU NEGATIVE 02/16/2017 1726   HGBUR SMALL (A) 02/16/2017 1726   BILIRUBINUR NEGATIVE 02/16/2017 1726   KETONESUR NEGATIVE 02/16/2017 1726   PROTEINUR NEGATIVE 02/16/2017 1726   NITRITE NEGATIVE 02/16/2017 1726   LEUKOCYTESUR NEGATIVE 02/16/2017 1726   Sepsis Labs Invalid input(s): PROCALCITONIN,  WBC,  LACTICIDVEN Microbiology Recent Results (from the past 240 hour(s))  MRSA PCR Screening     Status: None   Collection Time: 02/16/17 10:16 PM  Result Value Ref Range Status   MRSA by PCR NEGATIVE NEGATIVE Final    Comment:        The GeneXpert MRSA Assay (FDA approved for NASAL specimens only), is one component of a comprehensive MRSA colonization surveillance program. It is not intended to diagnose MRSA infection nor to guide or monitor treatment for MRSA infections.      Time coordinating discharge: 20 minutes  SIGNED:  Dessa Phi, DO [facility] Pager [phone]  If 7PM-7AM, please contact night-coverage [email or web address] Password [email or web address] 02/18/2017, 8:20 AM

## 2017-02-18 NOTE — Progress Notes (Signed)
Pt is ready to dc to [facility]. Family is in agreement with this plan. [facility] transport is required. Medical necessity form completed. Liaison provided DC Summary to facility. No scripts needed for transfer. # for report provided to nsg.  Werner Lean LCSW [phone]

## 2017-02-18 NOTE — Consult Note (Signed)
[facility]- Met with pt's son Everet and daughter Langley Gauss at bedside. Arville Go had spoke to them regarding the hospice services and philosophy of care. They were in agreement. The son has met met this morning to sign paperwork. They will be transferred today to [facility] in [facility].  MD aware and has completed discharge summary already.Will notify Case Manger that this has been completed and to help arrange transport. Webb Silversmith RN  Thank you for allowing [place] to work with you to provide continuity of care beyond his hospitalization.  [phone]

## 2017-02-18 NOTE — Progress Notes (Signed)
Transportation here to transport patient [employer]. Port remains intact with good blood return. Foley catheter inplace per request of Hospice [facility]. Patient denies pain, or needs.

## 2017-02-18 NOTE — Progress Notes (Signed)
   02/18/17 1000  Clinical Encounter Type  Visited With Patient and family together  Visit Type Initial;Psychological support;Spiritual support;Critical Care  Referral From Nurse  Consult/Referral To Chaplain  Spiritual Encounters  Spiritual Needs Emotional;Other (Comment) (Pastoral Conversation/Support)  Stress Factors  Patient Stress Factors Not reviewed  Family Stress Factors Health changes;Major life changes   I visited with the patient per Spiritual Care consult and Chaplain referral. The patient was not able to speak to me during my visit; but the patient's son was present in the room.  The patient's son stated that he is doing about the same as yesterday and understands the plan of care for his father.  He appreciates the support; but didn't state any needs at this time.   Please, contact Spiritual Care for further assistance.   [place] M.Div.

## 2017-02-21 ENCOUNTER — Encounter (HOSPITAL_COMMUNITY): Payer: Self-pay

## 2017-03-10 DEATH — deceased

## 2017-05-14 ENCOUNTER — Other Ambulatory Visit: Payer: Self-pay | Admitting: Nurse Practitioner

## 2017-10-09 IMAGING — CR DG CHEST 2V
2 series · 3 of 3 positions shown · non-contrast
Comparison: May 03, 2015.

CLINICAL DATA: Shortness of breath.

EXAM:
CHEST  2 VIEW

[chest lat]
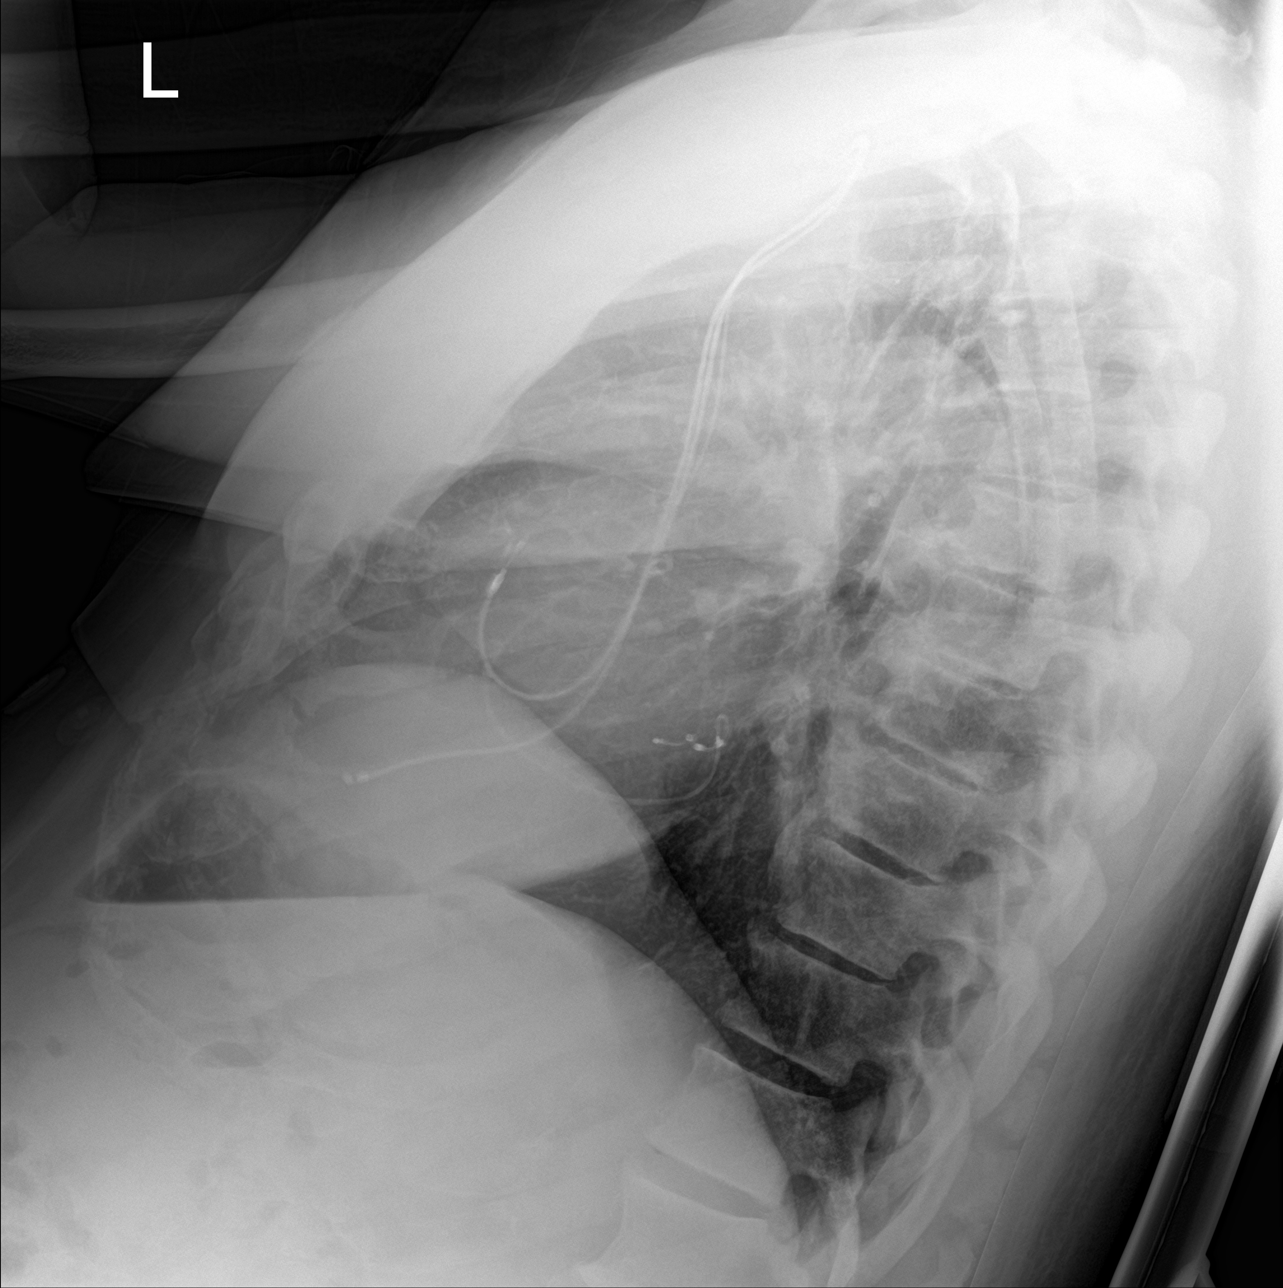

[Series 3: chest ap · 0.14mm/px · 2 of 2 slices shown]
[im 1/2]
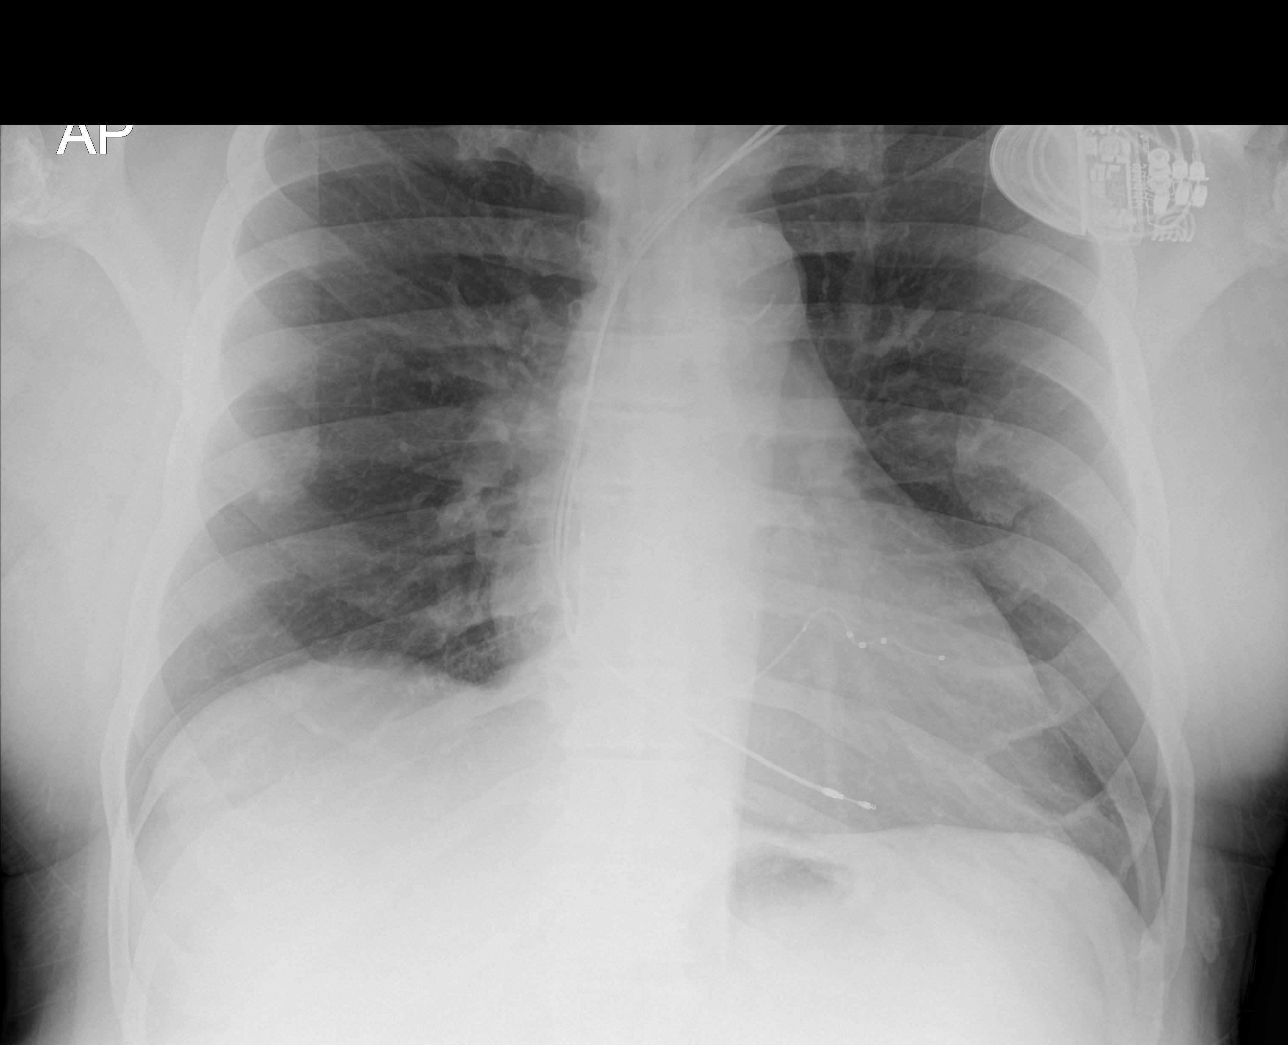
[im 2/2]
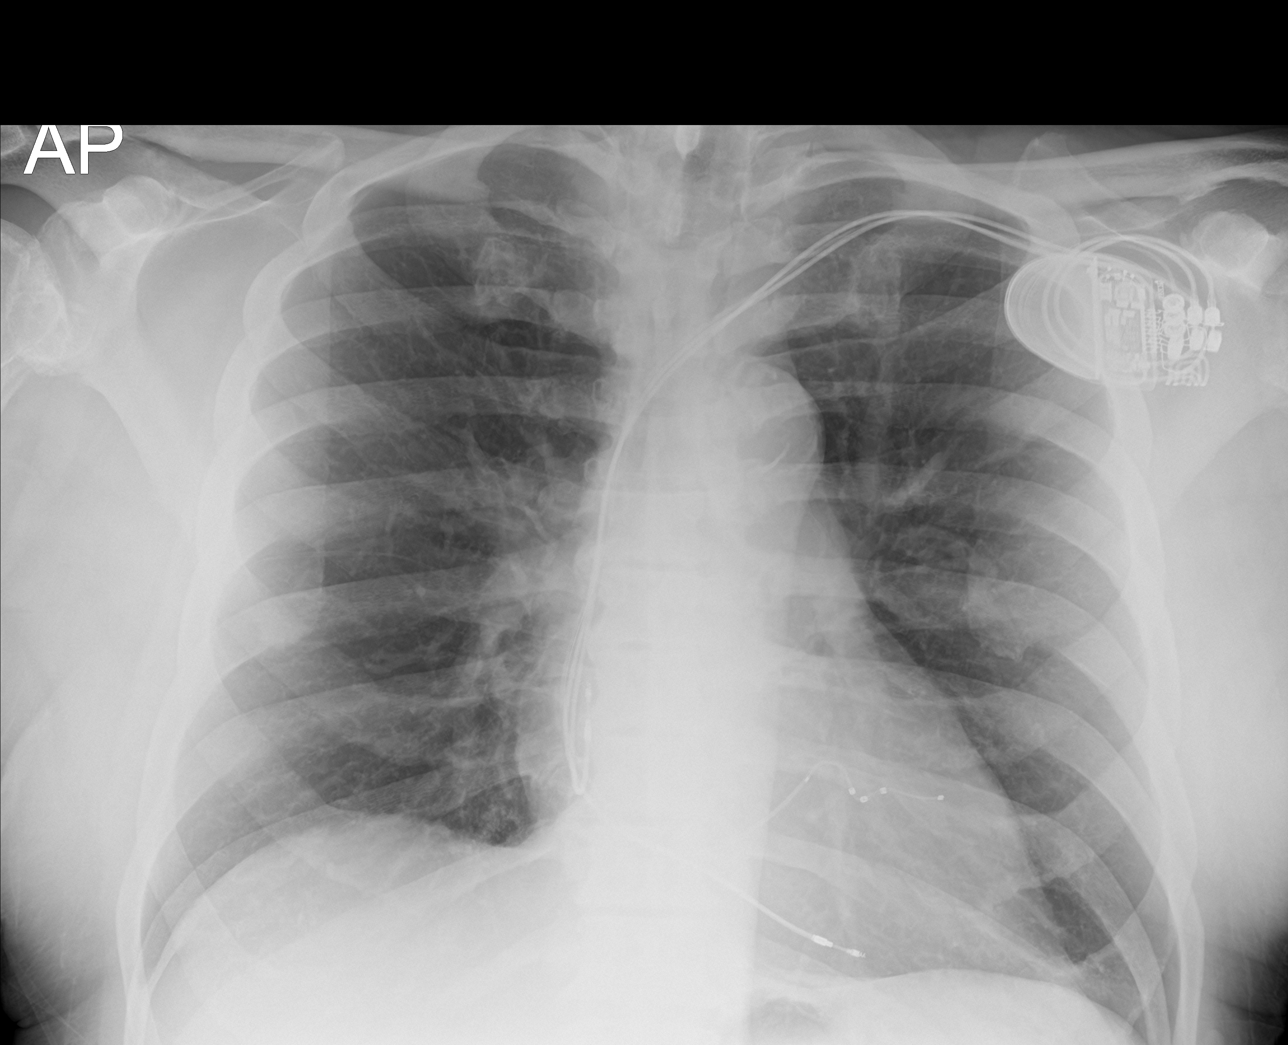

[3 of 3 positions shown; findings below may reference images not displayed]

FINDINGS: The heart size and mediastinal contours are within normal limits.
Both lungs are clear. No pneumothorax or pleural effusion is noted.
Left-sided pacemaker is unchanged in position. The visualized
skeletal structures are unremarkable.
IMPRESSION: No active cardiopulmonary disease.

## 2019-01-10 IMAGING — XA IR FLUORO GUIDE CV LINE*R*
2 series · 2 of 2 positions shown · non-contrast
Comparison: Chest CT - 11/08/2015

INDICATION: History of metastatic colon cancer. In need of durable intravenous
access for chemotherapy administration.

EXAM:
IMPLANTED PORT A CATH PLACEMENT WITH ULTRASOUND AND FLUOROSCOPIC
GUIDANCE

[Series 1: care single · 1 of 1 slices shown]
[im 1/1]
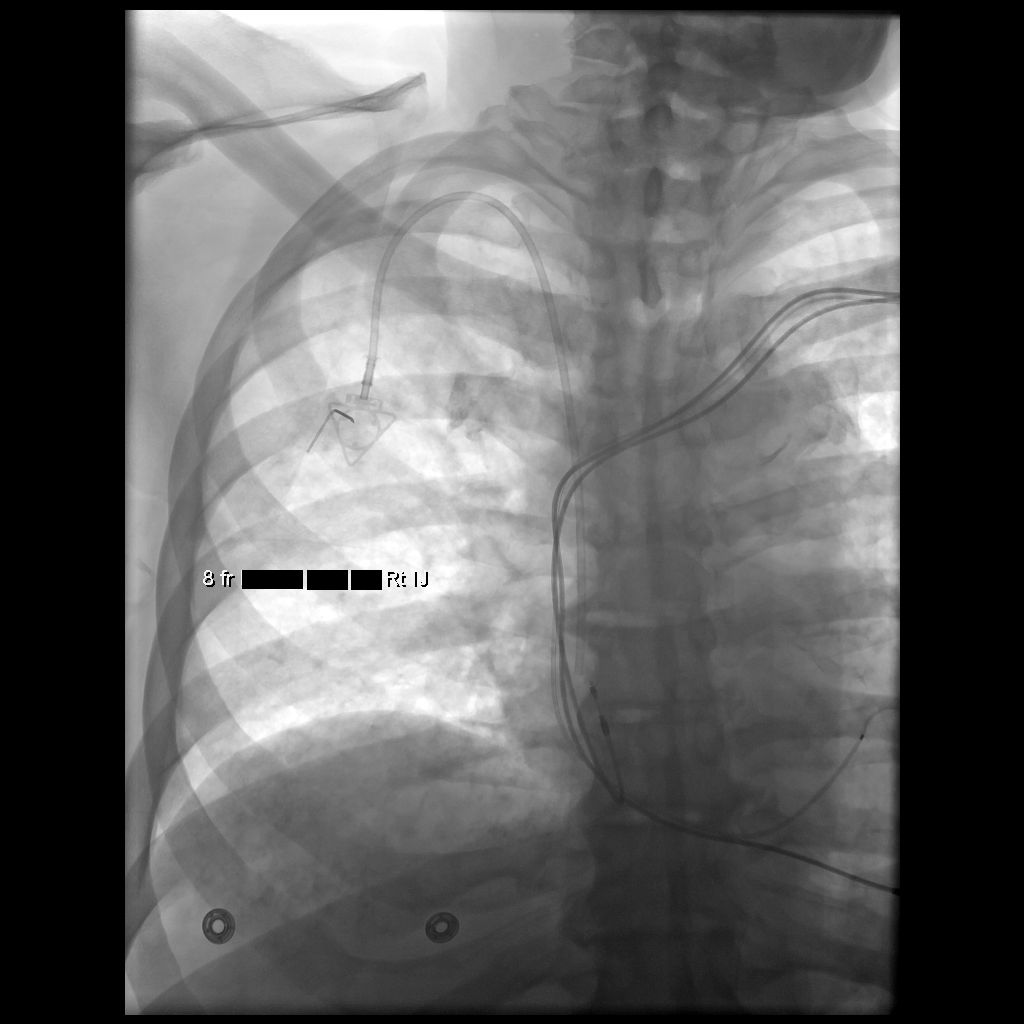

[Series 300: ir fluoro guide port insertion right · 1 of 1 slices shown]
[im 1/1]
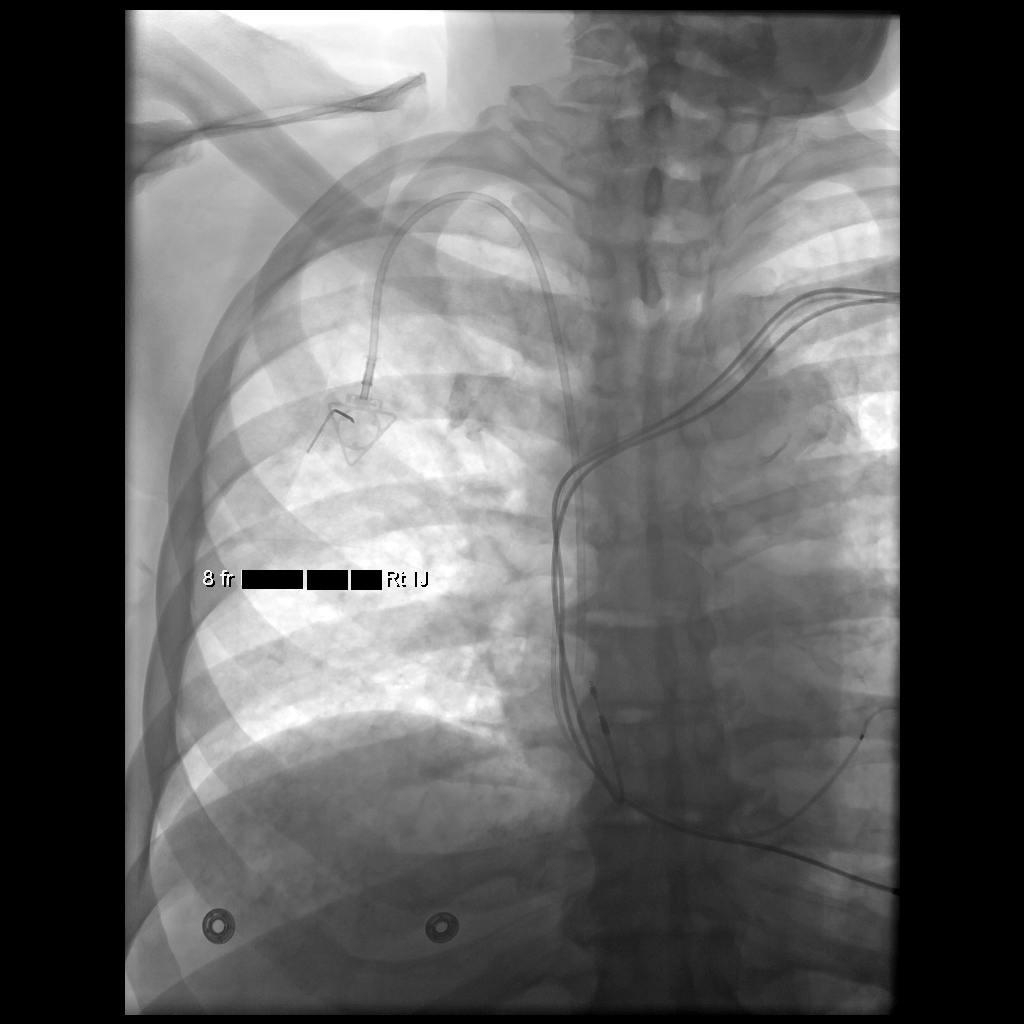

[2 of 2 positions shown; findings below may reference images not displayed]

MEDICATIONS:
Ancef 2 gm IV; The antibiotic was administered within an appropriate
time interval prior to skin puncture.

ANESTHESIA/SEDATION:
Moderate (conscious) sedation was employed during this procedure. A
total of Versed 2 mg and Fentanyl 100 mcg was administered
intravenously.

Moderate Sedation Time: 25 minutes. The patient's level of
consciousness and vital signs were monitored continuously by
radiology nursing throughout the procedure under my direct
supervision.

CONTRAST:  None

FLUOROSCOPY TIME:  18 seconds (7 mGy)

COMPLICATIONS:
None immediate.

PROCEDURE:
The procedure, risks, benefits, and alternatives were explained to
the patient. Questions regarding the procedure were encouraged and
answered. The patient understands and consents to the procedure.

The right neck and chest were prepped with chlorhexidine in a
sterile fashion, and a sterile drape was applied covering the
operative field. Maximum barrier sterile technique with sterile
gowns and gloves were used for the procedure. A timeout was
performed prior to the initiation of the procedure. Local anesthesia
was provided with 1% lidocaine with epinephrine.

After creating a small venotomy incision, a micropuncture kit was
utilized to access the internal jugular vein. Real-time ultrasound
guidance was utilized for vascular access including the acquisition
of a permanent ultrasound image documenting patency of the accessed
vessel. The microwire was utilized to measure appropriate catheter
length.

A subcutaneous port pocket was then created along the upper chest
wall utilizing a combination of sharp and blunt dissection. The
pocket was irrigated with sterile saline. A single lumen ISP power
injectable port was chosen for placement. The 8 Fr catheter was
tunneled from the port pocket site to the venotomy incision. The
port was placed in the pocket. The external catheter was trimmed to
appropriate length. At the venotomy, an 8 Fr peel-away sheath was
placed over a guidewire under fluoroscopic guidance. The catheter
was then placed through the sheath and the sheath was removed. Final
catheter positioning was confirmed and documented with a
fluoroscopic spot radiograph. The port was accessed with Ruben Victor [place]
needle, aspirated and flushed with heparinized saline.

The venotomy site was closed with an interrupted 4-0 Vicryl suture.
The port pocket incision was closed with interrupted 2-0 Vicryl
suture and the skin was opposed with a running subcuticular 4-0
Vicryl suture. Dermabond and Deleon were applied to both
incisions. Dressings were placed. The patient tolerated the
procedure well without immediate post procedural complication.
FINDINGS: After catheter placement, the tip lies within the superior
cavoatrial junction. The catheter aspirates and flushes normally and
is ready for immediate use.
IMPRESSION: Successful placement of a right internal jugular approach power
injectable Port-A-Cath. The catheter is ready for immediate use.
